# Patient Record
Sex: Female | Born: 1970
Health system: Southern US, Community
[De-identification: ages and names within clinical notes are randomized; demographics above are authoritative.]

## PROBLEM LIST (undated history)

## (undated) DIAGNOSIS — L0292 Furuncle, unspecified: Secondary | ICD-10-CM

## (undated) DIAGNOSIS — N946 Dysmenorrhea, unspecified: Secondary | ICD-10-CM

## (undated) DIAGNOSIS — E221 Hyperprolactinemia: Secondary | ICD-10-CM

## (undated) DIAGNOSIS — I10 Essential (primary) hypertension: Secondary | ICD-10-CM

## (undated) DIAGNOSIS — N938 Other specified abnormal uterine and vaginal bleeding: Secondary | ICD-10-CM

## (undated) DIAGNOSIS — E785 Hyperlipidemia, unspecified: Secondary | ICD-10-CM

## (undated) DIAGNOSIS — G43909 Migraine, unspecified, not intractable, without status migrainosus: Secondary | ICD-10-CM

## (undated) DIAGNOSIS — G4733 Obstructive sleep apnea (adult) (pediatric): Secondary | ICD-10-CM

## (undated) DIAGNOSIS — G43709 Chronic migraine without aura, not intractable, without status migrainosus: Secondary | ICD-10-CM

## (undated) DIAGNOSIS — D500 Iron deficiency anemia secondary to blood loss (chronic): Secondary | ICD-10-CM

## (undated) DIAGNOSIS — N83202 Unspecified ovarian cyst, left side: Principal | ICD-10-CM

## (undated) DIAGNOSIS — N83201 Unspecified ovarian cyst, right side: Secondary | ICD-10-CM

## (undated) DIAGNOSIS — E119 Type 2 diabetes mellitus without complications: Secondary | ICD-10-CM

## (undated) DIAGNOSIS — D259 Leiomyoma of uterus, unspecified: Secondary | ICD-10-CM

## (undated) DIAGNOSIS — R87619 Unspecified abnormal cytological findings in specimens from cervix uteri: Secondary | ICD-10-CM

## (undated) DIAGNOSIS — Z9989 Dependence on other enabling machines and devices: Secondary | ICD-10-CM

## (undated) DIAGNOSIS — R7309 Other abnormal glucose: Secondary | ICD-10-CM

## (undated) DIAGNOSIS — R9431 Abnormal electrocardiogram [ECG] [EKG]: Secondary | ICD-10-CM

## (undated) DIAGNOSIS — N920 Excessive and frequent menstruation with regular cycle: Secondary | ICD-10-CM

## (undated) DIAGNOSIS — G8929 Other chronic pain: Secondary | ICD-10-CM

## (undated) DIAGNOSIS — N809 Endometriosis, unspecified: Secondary | ICD-10-CM

## (undated) DIAGNOSIS — D219 Benign neoplasm of connective and other soft tissue, unspecified: Secondary | ICD-10-CM

## (undated) DIAGNOSIS — M545 Low back pain, unspecified: Secondary | ICD-10-CM

## (undated) DIAGNOSIS — G4730 Sleep apnea, unspecified: Secondary | ICD-10-CM

## (undated) HISTORY — DX: Abnormal electrocardiogram (ECG) (EKG): R94.31

## (undated) HISTORY — PX: ABDOMINAL HYSTERECTOMY: SHX81

## (undated) HISTORY — DX: Type 2 diabetes mellitus without complications: E11.9

## (undated) HISTORY — DX: Dependence on other enabling machines and devices: Z99.89

## (undated) HISTORY — DX: Sleep apnea, unspecified: G47.30

## (undated) HISTORY — DX: Migraine, unspecified, not intractable, without status migrainosus: G43.909

## (undated) HISTORY — DX: Benign neoplasm of connective and other soft tissue, unspecified: D21.9

## (undated) HISTORY — PX: EYE SURGERY: SHX253

## (undated) HISTORY — DX: Other specified abnormal uterine and vaginal bleeding: N93.8

## (undated) HISTORY — DX: Unspecified ovarian cyst, left side: N83.202

## (undated) HISTORY — DX: Essential (primary) hypertension: I10

## (undated) HISTORY — PX: REDUCTION MAMMAPLASTY: SUR839

## (undated) HISTORY — DX: Unspecified abnormal cytological findings in specimens from cervix uteri: R87.619

## (undated) HISTORY — DX: Obstructive sleep apnea (adult) (pediatric): G47.33

## (undated) HISTORY — DX: Endometriosis, unspecified: N80.9

## (undated) HISTORY — DX: Other abnormal glucose: R73.09

---

## 2002-12-31 HISTORY — PX: CHOLECYSTECTOMY: SHX55

## 2002-12-31 HISTORY — PX: LAPAROSCOPIC CHOLECYSTECTOMY: SUR755

## 2004-01-01 HISTORY — PX: REDUCTION MAMMAPLASTY: SUR839

## 2004-01-01 HISTORY — PX: BREAST REDUCTION SURGERY: SHX8

## 2013-12-31 HISTORY — PX: INCISION AND DRAINAGE: SHX5863

## 2014-12-31 DIAGNOSIS — Z8742 Personal history of other diseases of the female genital tract: Secondary | ICD-10-CM

## 2014-12-31 DIAGNOSIS — G4733 Obstructive sleep apnea (adult) (pediatric): Secondary | ICD-10-CM

## 2014-12-31 HISTORY — DX: Obstructive sleep apnea (adult) (pediatric): G47.33

## 2014-12-31 HISTORY — DX: Personal history of other diseases of the female genital tract: Z87.42

## 2015-06-08 ENCOUNTER — Encounter: Payer: Self-pay | Admitting: Family Medicine

## 2015-06-08 ENCOUNTER — Ambulatory Visit (INDEPENDENT_AMBULATORY_CARE_PROVIDER_SITE_OTHER): Payer: Self-pay | Admitting: Family Medicine

## 2015-06-08 VITALS — BP 112/86 | HR 77 | Temp 98.3°F | Ht 63.5 in | Wt 175.0 lb

## 2015-06-08 DIAGNOSIS — Z7689 Persons encountering health services in other specified circumstances: Secondary | ICD-10-CM

## 2015-06-08 DIAGNOSIS — E119 Type 2 diabetes mellitus without complications: Secondary | ICD-10-CM | POA: Insufficient documentation

## 2015-06-08 DIAGNOSIS — I10 Essential (primary) hypertension: Secondary | ICD-10-CM | POA: Diagnosis not present

## 2015-06-08 DIAGNOSIS — R202 Paresthesia of skin: Secondary | ICD-10-CM

## 2015-06-08 DIAGNOSIS — E669 Obesity, unspecified: Secondary | ICD-10-CM | POA: Insufficient documentation

## 2015-06-08 DIAGNOSIS — Z823 Family history of stroke: Secondary | ICD-10-CM

## 2015-06-08 DIAGNOSIS — F09 Unspecified mental disorder due to known physiological condition: Secondary | ICD-10-CM | POA: Diagnosis not present

## 2015-06-08 DIAGNOSIS — M5030 Other cervical disc degeneration, unspecified cervical region: Secondary | ICD-10-CM | POA: Diagnosis not present

## 2015-06-08 DIAGNOSIS — Z7189 Other specified counseling: Secondary | ICD-10-CM

## 2015-06-08 LAB — BASIC METABOLIC PANEL
BUN: 6 mg/dL (ref 6–23)
CALCIUM: 9 mg/dL (ref 8.4–10.5)
CHLORIDE: 105 meq/L (ref 96–112)
CO2: 25 meq/L (ref 19–32)
CREATININE: 0.6 mg/dL (ref 0.40–1.20)
GFR: 116.49 mL/min (ref >60.00)
Glucose, Bld: 87 mg/dL (ref 70–99)
Potassium: 3.2 mEq/L — ABNORMAL LOW (ref 3.5–5.1)
SODIUM: 137 meq/L (ref 135–145)

## 2015-06-08 MED ORDER — VALSARTAN 160 MG PO TABS: 160.0000 mg | ORAL_TABLET | Freq: Every day | ORAL | Status: DC

## 2015-06-08 NOTE — Progress Notes (Signed)
Pre visit review using our clinic review tool, if applicable. No additional management support is needed unless otherwise documented below in the visit note. 

## 2015-06-08 NOTE — Progress Notes (Signed)
HPI:  Melanie Little is here to establish care. Moved to [place] in January of 2016. Last PCP and physical: May of 2015 - normal pap but reports hx of remote abnormal pap and wants to get paps every year. Does not want to do pap today.  Has the following chronic problems that require follow up and concerns today:  HTN: -diagnosed with HTN about 6 years ago -meds: valsartan 163m daily -denies: CP, SOB, DOE, HA, swelling  Memory Issues: -started about 1 month ago -wrote her dates wrong a few times, sometimes forgets what she is going to do next, difficulty with remember to pay bills -never had any issues in the past -she has been under a lot of stress and has been anxious as husband recently had heart attack and a stroke -she thinks this is anxiety but she is worried about small stroke as her mother had many strokes and wants brain imaging -occ tingling in L hand and feet sometimes -denies: fevers, HA, vision changes, arm weakness,depression, known panic attacks, persistent paresthesias  Hx cervical DDD: -hx of radiculopathy L arm from this -reports had MRI in the past and was supposed to see NSU, but resolved after Pt -she has been pretty good since except for occ heaviness in L arm  ROS negative for unless reported above: fevers, unintentional weight loss, hearing or vision loss, chest pain, palpitations, struggling to breath, hemoptysis, melena, hematochezia, hematuria, falls, loc, si, thoughts of self harm  Past Medical History  Diagnosis Date  . Hypertension   . DUB (dysfunctional uterine bleeding)     around 2012, saw gyn at the time for eval    Past Surgical History  Procedure Laterality Date  . Cholecystectomy    . Breast reduction surgery    . Incision and drainage      Bilateral axillary region    Family History  Problem Relation Age of Onset  . CVA Mother     deceased, was 333at the age of her first stroke  . Diabetes Father   . Deep vein thrombosis  Father     History   Social History  . Marital Status: Married    Spouse Name: N/A  . Number of Children: N/A  . Years of Education: N/A   Social History Main Topics  . Smoking status: Never Smoker   . Smokeless tobacco: Not on file  . Alcohol Use: Not on file  . Drug Use: Not on file  . Sexual Activity: Not on file   Other Topics Concern  . 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  valsartan (DIOVAN) 160 MG tablet, Take 1 tablet (160 mg total) by mouth daily., Disp: 90 tablet, Rfl: 3  EXAM:  Filed Vitals:   06/08/15 1129  BP: 112/86  Pulse: 77  Temp: 98.3 F (36.8 C)    Body mass index is 30.51 kg/(m^2).  GENERAL: vitals reviewed and listed above, alert, oriented, appears well hydrated and in no acute distress  HEENT: atraumatic, conjunttiva clear, PERRLA, no obvious abnormalities on inspection of external nose and ears  NECK: no obvious masses on inspection, no bruit  LUNGS: clear to auscultation  bilaterally, no wheezes, rales or rhonchi, good air movement  CV: HRRR, no peripheral edema  MS: moves all extremities without noticeable abnormality  PSYCH: seems upset from the beginning of the visit - reports not given papers to complete until beginning of visit and seems upset about this, flat affect  NEURO: gait normal, mini cog normal, CN II-XII grossly intact, finger to nose normal, normal speech, normal thought processing, normal strength and sensation to light touch throughout bilat UE and LEs  ASSESSMENT AND PLAN:  Discussed the following assessment and plan:  Essential hypertension - Plan: Basic metabolic panel -continue current medication, lifestyle recs  Cognitive dysfunction - Plan: MR Brain W Wo  Contrast DDD (degenerative disc disease), cervical Paresthesia - Plan: MR Brain W Wo Contrast -benign exam, mild symptoms most likely related to anxiety and hx of DDD - not sig neurodeficits or findings on exam today -MRI after discussion risks/benefits for brain imaging -advised referral to PMR if recurrent radicular symptoms -advised CBT to assist in coping with recent stress -follow up immediately if any worsening or if persists  Family hx-stroke - Plan: MR Brain W Wo Contrast  Encounter to establish care -We reviewed the PMH, PSH, FH, SH, Meds and Allergies. -We provided refills for any medications we will prescribe as needed. -We addressed current concerns per orders and patient instructions. -We have asked for records for pertinent exams, studies, vaccines and notes from previous providers. -We have advised patient to follow up per instructions below.   -Patient advised to return or notify a doctor immediately if symptoms worsen or persist or new concerns arise.  Patient Instructions  BEFORE YOU LEAVE: -labs -schedule physical exam with pap smear in 3-4 month, come fasting   Consider seeing Dr. Glennon Hamilton to help with the stress  -We placed a referral for you as discussed for the MRI. It usually takes about 1-2 weeks to process and schedule this referral. If you have not heard from [place] regarding this appointment in 2 weeks please contact our office.  We recommend the following healthy lifestyle measures: - eat a healthy diet consisting of lots of vegetables, fruits, beans, nuts, seeds, healthy meats such as Buttery chicken and fish and whole grains.  - avoid starches, sweets, fried foods, fast food, processed foods, sodas, red meet and other fattening foods.  - get a least 150 minutes of aerobic exercise per week.         Colin Benton R.

## 2015-06-08 NOTE — Patient Instructions (Addendum)
BEFORE YOU LEAVE: -labs -schedule physical exam with pap smear in 3-4 month, come fasting   Consider seeing Dr. Glennon Hamilton to help with the stress  -We placed a referral for you as discussed for the MRI. It usually takes about 1-2 weeks to process and schedule this referral. If you have not heard from [place] regarding this appointment in 2 weeks please contact our office.  We recommend the following healthy lifestyle measures: - eat a healthy diet consisting of lots of vegetables, fruits, beans, nuts, seeds, healthy meats such as Helfman chicken and fish and whole grains.  - avoid starches, sweets, fried foods, fast food, processed foods, sodas, red meet and other fattening foods.  - get a least 150 minutes of aerobic exercise per week.

## 2015-06-10 ENCOUNTER — Other Ambulatory Visit: Payer: Self-pay | Admitting: *Deleted

## 2015-06-10 DIAGNOSIS — E876 Hypokalemia: Secondary | ICD-10-CM

## 2015-06-27 ENCOUNTER — Other Ambulatory Visit: Payer: 59

## 2015-07-08 ENCOUNTER — Other Ambulatory Visit: Payer: 59

## 2015-08-01 ENCOUNTER — Ambulatory Visit (INDEPENDENT_AMBULATORY_CARE_PROVIDER_SITE_OTHER): Payer: 59 | Admitting: Nurse Practitioner

## 2015-08-01 ENCOUNTER — Encounter: Payer: Self-pay | Admitting: Nurse Practitioner

## 2015-08-01 VITALS — BP 136/92 | HR 72 | Ht 63.5 in | Wt 181.0 lb

## 2015-08-01 DIAGNOSIS — Z113 Encounter for screening for infections with a predominantly sexual mode of transmission: Secondary | ICD-10-CM

## 2015-08-01 DIAGNOSIS — N920 Excessive and frequent menstruation with regular cycle: Secondary | ICD-10-CM | POA: Diagnosis not present

## 2015-08-01 DIAGNOSIS — Z0000 Encounter for general adult medical examination without abnormal findings: Secondary | ICD-10-CM | POA: Diagnosis not present

## 2015-08-01 DIAGNOSIS — N760 Acute vaginitis: Secondary | ICD-10-CM

## 2015-08-01 DIAGNOSIS — Z01419 Encounter for gynecological examination (general) (routine) without abnormal findings: Secondary | ICD-10-CM

## 2015-08-01 DIAGNOSIS — D259 Leiomyoma of uterus, unspecified: Secondary | ICD-10-CM

## 2015-08-01 LAB — POCT URINALYSIS DIPSTICK
Bilirubin, UA: NEGATIVE
GLUCOSE UA: NEGATIVE
Ketones, UA: NEGATIVE
Leukocytes, UA: NEGATIVE
Nitrite, UA: NEGATIVE
Protein, UA: NEGATIVE
RBC UA: NEGATIVE
Urobilinogen, UA: NEGATIVE
pH, UA: 5.5

## 2015-08-01 NOTE — Progress Notes (Signed)
Patient ID: Melanie Little, female   DOB: 05-07-1971, 44 y.o.   MRN: [id number] 45 y.o. [id number] Married  African American Fe here for annual exam. History of irregular menses for years with episodes of amenorrhea for 1-2 years.  She then went on Provera to start a cycle and took OCP for af few years.  Off OCP for 6 years now. Now menses is monthly lasting for 5 days.  Heavy for 3 days.  Super pad changing every 2-3 hours.  Has a lot of clots, some cramps.  She had last PUS in [place], [place]. And it was recommended then that she have surgery - she is unclear if hysterectomy or myomectomy.  She feels in general that menses is worse now than before.  Usually has migraines a few days before onset of cycle and last for 2 day - relief with Excedrin migraine.  Previous abnormal pap with colpo biopsy pap was normal and had repeat pap after that at a short interval.   Just married in January - together for 6 years.  Recent vaginal symptoms of itching and discomfort that seem to be related to vaginal gell for birth control.  She would like to discuss other options for birth control.  She is aware of risk with OCP with her history of HTN and Odem of CVA and DVT.  She would like the Mirena IUD.  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  Her husband has not found employment to this area yet as he is on medical disability followed a stroke and heart attack at age 70.  Patient's last menstrual period was 07/03/2015 (exact date).          Sexually active: Yes.    The current method of family planning is vaginal film.    Exercising: No.  The patient does not participate in regular exercise at present. Smoker:  no  Health Maintenance: Pap:  04/2014, normal,  History of colpo 2013, neg biopsy per patient MMG:  04/2014, normal - will schedule TDaP:  5 years ago Labs:  HB:  PCP  Urine:  Negative    reports that she has never smoked. She has never used smokeless tobacco. She reports that  she does not use illicit drugs.  Past Medical History  Diagnosis Date  . Hypertension   . DUB (dysfunctional uterine bleeding)     around 2012, saw gyn at the time for eval  . Fibroid   . Abnormal Pap smear of cervix about 2013    Colpo biopsy with normal result per pt.    Past Surgical History  Procedure Laterality Date  . Cholecystectomy  2004  . Breast reduction surgery  2005  . Incision and drainage Bilateral 2015    Bilateral axillary region ebaceous cyst    Current Outpatient Prescriptions  Medication Sig Dispense Refill  . valsartan (DIOVAN) 160 MG tablet Take 1 tablet (160 mg total) by mouth daily. 90 tablet 3   No current facility-administered medications for this visit.    Family History  Problem Relation Age of Onset  . CVA Mother 71    deceased, was 73 at the age of her first stroke  . Diabetes Father   . Deep vein thrombosis Father   . Lupus Sister     questionable diagnosis per patient    ROS:  Pertinent items are noted in HPI.  Otherwise, a comprehensive ROS was negative.  Exam:   BP 136/92 mmHg  Pulse 72  Ht 5' 3.5" (1.613 m)  Wt 181 lb (82.101 kg)  BMI 31.56 kg/m2  LMP 07/03/2015 (Exact Date) Height: 5' 3.5" (161.3 cm) Ht Readings from Last 3 Encounters:  08/01/15 5' 3.5" (1.613 m)  06/08/15 5' 3.5" (1.613 m)    General appearance: alert, cooperative and appears stated age Head: Normocephalic, without obvious abnormality, atraumatic Neck: no adenopathy, supple, symmetrical, trachea midline and thyroid normal to inspection and palpation Lungs: clear to auscultation bilaterally Breasts: normal appearance, no masses or tenderness, surgical reduction scars with large keloid scars at the axilla line. Heart: regular rate and rhythm Abdomen: soft, non-tender; no masses,  no organomegaly Extremities: extremities normal, atraumatic, no cyanosis or edema Skin: Skin color, texture, turgor normal. No rashes or lesions Lymph nodes: Cervical,  supraclavicular, and axillary nodes normal. No abnormal inguinal nodes palpated Neurologic: Grossly normal   Pelvic: External genitalia:  no lesions              Urethra:  normal appearing urethra with no masses, tenderness or lesions              Bartholin's and Skene's: normal                 Vagina: normal appearing vagina with normal color and discharge, no lesions              Cervix: anteverted              Pap taken: Yes.   Bimanual Exam:  Uterus:  enlarged, 14-16  weeks size              Adnexa: positive for: enlargement and and decrease cervical motion due to enlargement               Rectovaginal: Confirms               Anus:  normal sphincter tone, no lesions  Chaperone present:  yes  A:  Well Woman with normal exam  History of uterine fibroid (s) see report below  Menorrhagia  Vaginitis from birth control - vaginal gel  R/O STD's  History of  Migraine, HTN, BMI 31.56  History of abnormal pap with colpo biopsy 2013  S/P breast reduction 2015    P:   Reviewed health and wellness pertinent to exam  Pap smear as above  Mammogram is due and will schedule  Will follow up on labs, STD's, Affirm, pap  Will get ROI for pap, PUS done in [place]  Discussed options for birth control - cannot take OCP with risk factors.  Dicussed IUD but unable to tell if this is a good options given the enlargement of the uterus.  For now she will continue with vaginal spermicide.  Counseled on breast self exam, mammography screening, family planning choices, adequate intake of calcium and vitamin D, diet and exercise return annually or prn  An After Visit Summary was printed and given to the patient.  PUS at [facility]:  05/13/13- PUS with conclusion of 9.4 X 6.8 X 6.4 cm left adnexal mass involving the left ovary that could be hemorrhagic cyst, endometrioma, or possible dermoid.  CT scan:  05/21/13: large cystic mass left adnexa 7.5 cm with septations.  Smaller cystic lesion on the right  adnexa measuring 3.3 cm with multinodular uterus.

## 2015-08-01 NOTE — Patient Instructions (Addendum)

## 2015-08-02 LAB — WET PREP BY MOLECULAR PROBE
CANDIDA SPECIES: NEGATIVE
GARDNERELLA VAGINALIS: NEGATIVE
Trichomonas vaginosis: NEGATIVE

## 2015-08-02 LAB — STD PANEL
HIV 1&2 Ab, 4th Generation: NONREACTIVE
Hepatitis B Surface Ag: NEGATIVE

## 2015-08-03 LAB — IPS N GONORRHOEA AND CHLAMYDIA BY PCR

## 2015-08-04 ENCOUNTER — Telehealth: Payer: Self-pay | Admitting: Emergency Medicine

## 2015-08-04 DIAGNOSIS — D259 Leiomyoma of uterus, unspecified: Secondary | ICD-10-CM

## 2015-08-04 DIAGNOSIS — N921 Excessive and frequent menstruation with irregular cycle: Secondary | ICD-10-CM

## 2015-08-04 LAB — IPS PAP TEST WITH HPV

## 2015-08-04 NOTE — Telephone Encounter (Signed)
Spoke with patient to verify and review benefits for shgm. Attempted appointment on Tuesday 08/09/15, however patient stated she is due any day to start her cycle. Patient asks if this will effect the test. Spoke with nurse Olivia Mackie and she stated based on the patients birth control method and other factors patient is advised to call on first day of cycle to schedule within 10 days. Patient understands. Patient asked about recent lab results. Transferred to Olivia Mackie to review results. Pt understands [facility] plan going forward and will call with next cycle. Patient understands benefit.

## 2015-08-04 NOTE — Telephone Encounter (Signed)
Patient was on the phone with insurance and billing representative and requested her lab results. Results given to patient and questions answered. She will call with the first day of her cycle to schedule Sonohysterogram.  Routing to provider for final review. Patient agreeable to disposition. Will close encounter.    Marland Kitchen

## 2015-08-04 NOTE — Telephone Encounter (Signed)
-----   Message from Kem Boroughs, Luis Llorens Torres sent at 08/03/2015  4:55 PM EDT ----- Please let pt. know that STD panel is negative for HIV, Hep B, STS, GC & chlamydia.  The Affirm test also is negative.

## 2015-08-06 NOTE — Progress Notes (Signed)
Encounter reviewed by Dr. Aundria Rud. Will start with pelvic ultrasound for evaluation.

## 2015-08-08 NOTE — Telephone Encounter (Signed)
Left message to call [place] at [phone].  Spoke with Milford Cage, FNP who states patient will need to be seen for PUS next Thursday 08/18/2015 with Dr.Silva.

## 2015-08-08 NOTE — Telephone Encounter (Signed)
Spoke with patient. Advised okay to proceed with PUS per Milford Cage, FNP after speaking with Dr.Silva. Appointment scheduled for 08/18/2015 at 11:30am with 12 pm consult with Dr.Silva. Patient is agreeable to date and time.  Cc: Dr.Silva  Routing to provider for final review. Patient agreeable to disposition. Will close encounter.   Patient aware provider will review message and nurse will return call if any additional advice or change of disposition.

## 2015-08-09 ENCOUNTER — Telehealth: Payer: Self-pay | Admitting: Emergency Medicine

## 2015-08-09 DIAGNOSIS — IMO0002 Reserved for concepts with insufficient information to code with codable children: Secondary | ICD-10-CM

## 2015-08-09 NOTE — Telephone Encounter (Signed)
-----   Message from Kem Boroughs, [place] sent at 08/05/2015  8:00 AM EDT ----- Please let pt. Know that she will need a repeat colpo biopsy - last one was done in [place].

## 2015-08-09 NOTE — Telephone Encounter (Deleted)
-----   Message from Kem Boroughs, [place] sent at 08/05/2015  8:00 AM EDT ----- Please let pt. Know that she will need a repeat colpo biopsy - last one was done in [place].

## 2015-08-09 NOTE — Telephone Encounter (Signed)
I would like for the patient to keep her ultrasound appointment with me for 08/18/15 and get this done first.  Please precert the colposcopy and schedule the patient.  She has a lot of gynecologic issues that I would like to move forward with in her care.

## 2015-08-09 NOTE — Telephone Encounter (Signed)
Sending this message again.  I am not sure it went through.

## 2015-08-09 NOTE — Telephone Encounter (Addendum)
Spoke with patient. Advised of abnormal cells on pap smear. Needs repeat colposcopy per Milford Cage, FNP.  Patient sexually active, uses contraceptive film for birth control. Started her cycle today and typically lasts for 4-5 days. Patient is currently scheduled for 08/18/15 for Pelvic ultrasound  With Dr. Quincy Simmonds for evaluation of enlarged uterus with history of uterine fibroids. Advised patient may need to reschedule Pelvic ultrasound for after colposcopy and complete colposcopy first as will need to be scheduled for procedure within 12 days of start of cycle.  Advised will need to confirm with Dr. Quincy Simmonds.  Patient agreeable. Advised I will contact her with response from Dr. Quincy Simmonds and return call to schedule colposcopy. Patient agreeable.  Recall for one month entered for colposcopy per Lamont Snowball, RN.  Routing to  [employer] for colposcopy pre-certification.  Dr. Quincy Simmonds, can you review and advise?

## 2015-08-11 NOTE — Telephone Encounter (Signed)
Message left to return call to Melanie Little at [phone].    

## 2015-08-11 NOTE — Telephone Encounter (Signed)
Returning a call to [employer]

## 2015-08-11 NOTE — Telephone Encounter (Signed)
Call to patient.  She is agreeable to message from Dr. Quincy Simmonds.  She is scheduled for Pelvic ultrasound with Dr. Quincy Simmonds on 08/18/15.  She is scheduled for colposcopy on 08/19/15 at 1500 with Dr. Quincy Simmonds. LMP 08/09/15.  She is given pre-procedure instructions for colposcopy and verbalized understanding.  Advised will be contacted with insurance benefits information. cc Kerry Hough.   Routing to provider for final review. Patient agreeable to disposition. Will close encounter.

## 2015-08-16 ENCOUNTER — Ambulatory Visit: Payer: Self-pay | Admitting: Family Medicine

## 2015-08-18 ENCOUNTER — Ambulatory Visit (INDEPENDENT_AMBULATORY_CARE_PROVIDER_SITE_OTHER): Payer: 59 | Admitting: Obstetrics and Gynecology

## 2015-08-18 ENCOUNTER — Ambulatory Visit (INDEPENDENT_AMBULATORY_CARE_PROVIDER_SITE_OTHER): Payer: 59

## 2015-08-18 ENCOUNTER — Encounter: Payer: Self-pay | Admitting: Obstetrics and Gynecology

## 2015-08-18 VITALS — BP 130/82 | HR 76 | Ht 63.5 in | Wt 181.0 lb

## 2015-08-18 DIAGNOSIS — N921 Excessive and frequent menstruation with irregular cycle: Secondary | ICD-10-CM | POA: Diagnosis not present

## 2015-08-18 DIAGNOSIS — N83202 Unspecified ovarian cyst, left side: Secondary | ICD-10-CM

## 2015-08-18 DIAGNOSIS — N8320 Unspecified ovarian cysts: Secondary | ICD-10-CM | POA: Diagnosis not present

## 2015-08-18 DIAGNOSIS — D259 Leiomyoma of uterus, unspecified: Secondary | ICD-10-CM

## 2015-08-18 DIAGNOSIS — N920 Excessive and frequent menstruation with regular cycle: Secondary | ICD-10-CM | POA: Diagnosis not present

## 2015-08-18 NOTE — Progress Notes (Signed)
Subjective  44 y.o. [id number] African American female here for pelvic ultrasound for  Follow up of pelvic mass and uterine fibroids noted on ultrasound and CT scan at outside institutions from 2014.  Pelvic ultrasound - 05/13/13 - [facility] in [place], [place]: Uterus with 3 fibroids - largest 1.5 cm.  EMS 7 mm.  Left ovarian mass 6.8 x 6.4 x 9.4 cm with low level echoes - hemorrhagic versus endometrioma versus dermoid cyst. Right ovary 3.5 x 2.2 x 3.1 cm not seen separately from the uterus.   CT scan - 05/21/13 - [facility]: Uterus with multi-lobulated masses consistent with fibroids.  Left adnexa with 7.5 cm cystic mass with septation and mildly thickened wall. Right adnexa with 3.3 cm right cyst. Cysts suggestive of hemorrhagic cysts.  History is as follows:   History of skipped menses for 1-2 years at at time in the past.  Has taken Provera and OCPs. Now getting cycles regularly.   Developed menstrual cramps in the last year.  Also having heavier cycles and blood clotting.  Menses heavy second or third day.   Wearing overnight pads during the day.  Pad change every 1.5 hours for at least 2 of 4 days. No intermensttrual bleeding.  Can spot with intercourse.  Using contraceptive film for pregnancy prevention. Feels like she had a reaction a get to this with increased discharge.  Menstrual headaches prior to menses. Has photophobia.  No aura.  Has some heaviness of her left arm during her menses.   Objective  Pelvic ultrasound images and report reviewed with patient.  Uterus - 8 fibroids ranging from 0.94 - 1.37 cm EMS - 9.65 mm.  No masses. Ovaries - right ovary - follicles.  Left ovary 62 x 68 mm thick walled cyst with echogenic fluid with hemorrhagic cyst versus other etiology.  No abnormal blood flow. Free fluid - no        Pap  08/01/15 - ASCUS and positive HR HPV.  GC/CT 08/01/15 - both negative.   Assessment  Menorrhagia.  Post  coital bleeding.  Fibroids.  Complex left adnexal mass.  No change in size since 2014 assuming this is the same that was seen in 2014.  I suspect this may be a cystadenoma.  Doubt malignancy.  Abnormal pap. Menstrual headaches.  HTN.  FH of DVT.  Plan  Comprehensive discussion of fibroids - etiology, natural history, benign nature, symptoms, and treatment options - estrogen free contraception, uterine artery embolization, myomectomy, hysterectomy.  Discussion of ovarian cysts.  Will plan for a minimum of repeat ultrasound in 6 weeks.  I discussed potential removal of the ovary if persistent.  Will return for colposcopy and EMB.  Procedures explained.  ____25__ minutes face to face time of which over 50% was spent in counseling.   After visit summary to patient.

## 2015-08-19 ENCOUNTER — Encounter: Payer: Self-pay | Admitting: Obstetrics and Gynecology

## 2015-08-19 ENCOUNTER — Ambulatory Visit (INDEPENDENT_AMBULATORY_CARE_PROVIDER_SITE_OTHER): Payer: 59 | Admitting: Obstetrics and Gynecology

## 2015-08-19 DIAGNOSIS — R896 Abnormal cytological findings in specimens from other organs, systems and tissues: Secondary | ICD-10-CM | POA: Diagnosis not present

## 2015-08-19 DIAGNOSIS — IMO0002 Reserved for concepts with insufficient information to code with codable children: Secondary | ICD-10-CM

## 2015-08-19 NOTE — Progress Notes (Signed)
Subjective:     Patient ID: Melanie Little, female   DOB: [birth year], 44 y.o.   MRN: [id number]  HPI  Patient here today for colposcopy.  Pap smear on 08-01-15 was Ascus:Pos HR HPV.  Patient has a history of abnormal pap smear with negative colposcopy in 2013--no treatment to cervix.  Patient also being evaluated for menorrhagia, fibroids, and complex left adnexal mass that is stable for the last 2 years.   Needs EMB, and this needs precert.  Review of Systems  LMP:  08-12-15 Contraception: Vaginal film     Objective:   Physical Exam  Genitourinary:    Colposcopy Consent for procedure.  Speculum placed in vagina.  3% acetic acid used.  Colposcopy satisfactory.  Thin rim of acetowhite at 6:00 and 12:00.  Friability of the endocervix with contact with Q-tips. Specimen 1 - ECC to pathology.  Specimen 2- biopsy of exocervix at 6:00 to pathology.  Specimen 3 - biopsy of exocervix at 12:00 to pathology. Monsel's placed. Minimal EBL.  No complications.     Assessment:     ASCUS. Positive HR HPV.  Friable cervix.  Menorrhagia. Fibroids.  Complex left adnexal mass.     Plan:     Follow up biopsies. Instructions and precautions given.  Will still need to return for EMB which needs precert from insurance.  Final plan to follow.   After visit summary to patient.

## 2015-08-19 NOTE — Patient Instructions (Signed)
Colposcopy, Care After Refer to this sheet in the next few weeks. These instructions provide you with information on caring for yourself after your procedure. Your health care provider may also give you more specific instructions. Your treatment has been planned according to current medical practices, but problems sometimes occur. Call your health care provider if you have any problems or questions after your procedure. WHAT TO EXPECT AFTER THE PROCEDURE  After your procedure, it is typical to have the following:  Cramping. This often goes away in a few minutes.  Soreness. This may last for 2 days.  Lightheadedness. Lie down for a few minutes if this occurs. You may also have some bleeding or dark discharge for a few days. You may need to wear a sanitary pad during this time. HOME CARE INSTRUCTIONS  Avoid sex, douching, and using tampons for 3 days or as directed by your health care provider.  Only take over-the-counter or prescription medicines as directed by your health care provider. Do not take aspirin because it can cause bleeding.  Continue to take birth control pills if you are on them.  Not all test results are available during your visit. If your test results are not back during the visit, make an appointment with your health care provider to find out the results. Do not assume everything is normal if you have not heard from your health care provider or the medical facility. It is important for you to follow up on all of your test results.  Follow your health care provider's advice regarding activity, follow-up visits, and follow-up Pap tests. SEEK MEDICAL CARE IF:  You develop a rash.  You have problems with your medicine. SEEK IMMEDIATE MEDICAL CARE IF:  You are bleeding heavily or are passing blood clots.  You have a fever.  You have abnormal vaginal discharge.  You are having cramps that do not go away after taking your pain medicine.  You feel lightheaded, dizzy, or  faint.  You have stomach pain. Document Released: 10/07/2013 Document Reviewed: 10/07/2013 [facility] Patient Information 2015 [place], [place]. This information is not intended to replace advice given to you by your health care provider. Make sure you discuss any questions you have with your health care provider.

## 2015-08-23 LAB — IPS OTHER TISSUE BIOPSY

## 2015-08-25 ENCOUNTER — Telehealth: Payer: Self-pay | Admitting: Obstetrics and Gynecology

## 2015-08-25 NOTE — Telephone Encounter (Signed)
Spoke with patient,reviewed benefits for procedure and patient is agreeable. Routing to triage to schedule appointment.

## 2015-08-25 NOTE — Telephone Encounter (Signed)
Call to patient to review benefits for procedure. Left voicemail for patient to return my call

## 2015-08-26 NOTE — Telephone Encounter (Signed)
Dr. Quincy Simmonds, can you review and advise on when best time to schedule? Need to schedule Endometrial biopsy for patient.  Sexually active and using contraceptive film.

## 2015-08-28 NOTE — Telephone Encounter (Signed)
Please schedule at time when there is not risk of pregnancy, i.e. Just after menses complete and before ovulation time.  I hope this helps.

## 2015-08-29 NOTE — Telephone Encounter (Signed)
Message left to return call to [place] at [phone].   Need to schedule Endometrial biopsy.  Patient aware of benefits for biopsy. LMP 08/12/15 per last ultrasound note.    Patient also needs to have an appointment scheduled for a repeat ultrasound for first week in October - has a complex left ovarian cyst.

## 2015-08-30 NOTE — Telephone Encounter (Signed)
Spoke with patient. Advised of need to schedule Endometrial biopsy. Patient aware of need and discussed reasons for biopsy, evaluate for any presence of abnormal cells or precancerous changes of the uterine lining, infection, polyps or fibroids. Patient agreeable. She is advised to call with first day of cycle so that Endometrial biopsy can be scheduled within first 12 days of cycle.  Patient agreeable. She also has questions regarding colposcopy and pap results, discussed HPV and results and patient verbalized understanding. She will call back with first day of cycle to schedule.

## 2015-08-31 NOTE — Telephone Encounter (Signed)
Please also schedule the pelvic ultrasound for 6 weeks from the last ultrasound visit.  This is very important as the patient has a very large ovarian cyst that needs to be followed.  Thank you.

## 2015-09-12 ENCOUNTER — Telehealth: Payer: Self-pay | Admitting: Nurse Practitioner

## 2015-09-12 NOTE — Telephone Encounter (Signed)
Patient was told to call on the first day of her cycle to schedule further testing. Last seen 08/19/15.

## 2015-09-12 NOTE — Telephone Encounter (Signed)
Message left to return call to [place] at [phone].   Orders in placed for patient for Endometrial biopsy.  Patient to have prior to cycle day 12.  Also needs Pelvic ultrasound scheduled, but for 6 weeks after initial ultrasound on 08/18/15 approximately 09/22/15 for follow up ovarian cyst.

## 2015-09-14 NOTE — Telephone Encounter (Signed)
Message left to return call to [place] at [phone].  Patient can speak with [facility] as well, needs to schedule Endometrial biopsy, cycle dependent.

## 2015-09-15 NOTE — Telephone Encounter (Signed)
Called patient.  Cycle started 09/11/15.   She is advised needs to schedule Endometrial biopsy prior to day 12. Patient requests afternoon only appointment. Scheduled for 09/19/15 at 1500.  Patient declines to schedule follow up ultrasound at this time. She needs afternoon appointment. Advised can discuss with provider at procedure visit for Endometrial biopsy. Will make plan for follow up Pelvic ultrasound. Patient declines to take 09/22/15 for Endometrial biopsy and Pelvic ultrasound because she cannot come in the morning.   Pre-procedure instructions given. Motrin 800 mg PO one hour before appointment with food and fluids.   Call with any concerns prior to appointment.  Routing to provider for final review. Patient agreeable to disposition. Will close encounter.

## 2015-09-16 ENCOUNTER — Telehealth: Payer: Self-pay | Admitting: Obstetrics and Gynecology

## 2015-09-16 NOTE — Telephone Encounter (Signed)
Phone call returned to patient to review the endometrial biopsy procedure and indications.  Patient now understands that this is for evaluation of endometrial pathology and not cervical pathology. She will proceed with the procedure.  I recommended Motrin 800 mg one hour prior to procedure and eat food before her appointment.   I also reviewed the need for a pelvic ultrasound to recheck her ovarian mass at the beginning of October.

## 2015-09-16 NOTE — Telephone Encounter (Signed)
Patient called requesting to cancel her endometrial biopsy with Dr Quincy Simmonds scheduled for Monday 09/19/15. Patient had multiple clinical concerns regarding procedure/treatment plan. Suggested patient speak with provider prior to making cancellation. Patient agreeable. Spoke with Dr Quincy Simmonds, she would like to call patient to review information.

## 2015-09-19 ENCOUNTER — Encounter: Payer: Self-pay | Admitting: Family Medicine

## 2015-09-19 ENCOUNTER — Ambulatory Visit (INDEPENDENT_AMBULATORY_CARE_PROVIDER_SITE_OTHER): Payer: 59 | Admitting: Obstetrics and Gynecology

## 2015-09-19 ENCOUNTER — Encounter: Payer: Self-pay | Admitting: Obstetrics and Gynecology

## 2015-09-19 VITALS — BP 130/96 | HR 70 | Ht 63.5 in | Wt 184.4 lb

## 2015-09-19 DIAGNOSIS — N8320 Unspecified ovarian cysts: Secondary | ICD-10-CM

## 2015-09-19 DIAGNOSIS — D259 Leiomyoma of uterus, unspecified: Secondary | ICD-10-CM

## 2015-09-19 DIAGNOSIS — N920 Excessive and frequent menstruation with regular cycle: Secondary | ICD-10-CM | POA: Diagnosis not present

## 2015-09-19 DIAGNOSIS — N83202 Unspecified ovarian cyst, left side: Secondary | ICD-10-CM

## 2015-09-19 NOTE — Progress Notes (Signed)
Patient ID: Melanie Little, female   DOB: 1971/12/16, 44 y.o.   MRN: [id number] GYNECOLOGY  VISIT   HPI: 44 y.o.   Married  [place] American  female   [phone] with Patient's last menstrual period was 09/11/2015 (exact date).   here for Endometrial biopsy due to menorrhagia. Has known fibroids and large left ovarian cyst.  Thinking about uterine artery embolization.   Pants not fitting as well.  Feels full early.   GYNECOLOGIC HISTORY: Patient's last menstrual period was 09/11/2015 (exact date). Contraception:Vaginal film Menopausal hormone therapy: n/a Last mammogram: 04/5014 [place] per [place] Last pap smear: 08-01-15 Ascus:Pos HR HPV;colposcopy revealed ECC-benign, Exocervical bx--HPV effect but no dysplasia.        OB History    Gravida Para Term Preterm AB TAB SAB Ectopic Multiple Living   4 1 1  0 3 3 0 0 0 1         Patient Active Problem List   Diagnosis Date Noted  . Essential hypertension 06/08/2015    Past Medical History  Diagnosis Date  . Hypertension   . DUB (dysfunctional uterine bleeding)     around 2012, saw gyn at the time for eval  . Fibroid   . Abnormal Pap smear of cervix about 2013    Colpo biopsy with normal result per pt.    Past Surgical History  Procedure Laterality Date  . Cholecystectomy  2004  . Breast reduction surgery  2005  . Incision and drainage Bilateral 2015    Bilateral axillary region ebaceous cyst    Current Outpatient Prescriptions  Medication Sig Dispense Refill  . valsartan (DIOVAN) 160 MG tablet Take 1 tablet (160 mg total) by mouth daily. 90 tablet 3   No current facility-administered medications for this visit.     ALLERGIES: Review of patient's allergies indicates no known allergies.  Family History  Problem Relation Age of Onset  . CVA Mother 74    deceased, was 82 at the age of her first stroke  . Diabetes Father   . Deep vein thrombosis Father   . Lupus Sister     questionable diagnosis per patient     Social History   Social History  . Marital Status: Married    Spouse Name: N/A  . Number of Children: N/A  . Years of Education: N/A   Occupational History  . Not on file.   Social History Main Topics  . Smoking status: Never Smoker   . Smokeless tobacco: Never Used  . Alcohol Use: Not on file  . Drug Use: No  . Sexual Activity: Yes    Birth Control/ Protection: Other-see comments     Comment: vaginal film, spermicide gel   Other Topics Concern  . 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  PHYSICAL EXAMINATION:    BP 130/96 mmHg  Pulse 70  Ht 5' 3.5" (1.613 m)  Wt 184 lb 6.4 oz (83.643 kg)  BMI 32.15 kg/m2  LMP 09/11/2015 (Exact Date)    General appearance: alert, cooperative and appears stated age    Pelvic: External genitalia:  no lesions  Urethra:  normal appearing urethra with no masses, tenderness or lesions              Bartholins and Skenes: normal                 Vagina: normal appearing vagina with normal color and discharge, no lesions              Cervix: no lesions              Bimanual Exam:  Uterus:  enlarged,  12 weeks size              Adnexa: no mass, fullness, tenderness of right.  Left adnexal fullness.   Procedure - endometrial biopsy Consent performed. Speculum place in vagina.  Sterile prep of cervix with Hibiclens. Tenaculum to anterior cervical lip. Paracervical block with 10 cc 1% lidocaine - yes.  Lot [id number], exp 01/01/16. Pipelle placed to       8   cm without difficulty twice. Tissue obtained and sent to pathology. Speculum removed.  No complications. Minimal EBL.  Chaperone was present for exam.  ASSESSMENT  Menorrhagia.  Fibroid uterus.  Abnormal pap.   ASCUS and positive HR HPV.  Colpo HPV changes but no dysplasia. Left adnexal mass.   PLAN  Instructions and precautions regarding EMB. Will call with results of EMB. Pelvic ultrasound in about 2 weeks.  Will schedule. Discussed uterine artery embolization with patient.   Discussed removal of left tube and ovary and hysterectomy also as a way to surgically treat the pelvic mass, if persistent, and the uterine fibroids and bleeding.  An After Visit Summary was printed and given to the patient.  ___25___ minutes face to face time of which over 50% was spent in counseling.

## 2015-09-19 NOTE — Patient Instructions (Addendum)
Uterine Artery Embolization for Fibroids Uterine artery embolization is a nonsurgical treatment to shrink fibroids. A thin plastic tube (catheter) is used to inject material that blocks off the blood supply to the fibroid, which causes the fibroid to shrink. LET [facility] CARE PROVIDER KNOW ABOUT:  Any allergies you have.  All medicines you are taking, including vitamins, herbs, eye drops, creams, and over-the-counter medicines.  Previous problems you or members of your family have had with the use of anesthetics.  Any blood disorders you have.  Previous surgeries you have had.  Medical conditions you have. RISKS AND COMPLICATIONS  Injury to the uterus from decreased blood supply  Infection.  Blood infection (septicemia).  Lack of menstrual periods (amenorrhea).  Death of tissue cells (necrosis) around your bladder or vulva.  Development of a hole between organs or from an organ to the surface of your skin (fistula).  Blood clot in the legs (deep vein thrombosis) or lung (pulmonary embolus). BEFORE THE PROCEDURE  Ask your health care provider about changing or stopping your regular medicines.   Do not take aspirin or blood thinners (anticoagulants) for 1 week before the surgery or as directed by your health care provider.  Do not eat or drink anything for 8 hours before the surgery or as directed by your health care provider.   Empty your bladder before the procedure begins. PROCEDURE   An IV tube will be placed into one of your veins. This will be used to give you a sedative and pain medication (conscious sedation).  You will be given a medicine that numbs the area (local anesthetic).  A small cut will be made in your groin. A catheter is then inserted into the main artery of your leg.  The catheter will be guided through the artery to your uterus. A series of images will be taken while dye is injected through the catheter in your groin. X-rays are taken at the  same time. This is done to provide a road map of the blood supply to your uterus and fibroids.  Tiny plastic spheres, about the size of sand grains, will be injected through the catheter. Metal coils may be used to help block the artery. The particles will lodge in tiny branches of the uterine artery that supplies blood to the fibroids.  The procedure is repeated on the artery that supplies the other side of the uterus.  The catheter is then removed and pressure is held to stop any bleeding. No stitches are needed.  A dressing is then placed over the cut (incision). AFTER THE PROCEDURE  You will be taken to a recovery area where your progress will be monitored until you are awake, stable, and taking fluids well. If there are no other problems, you will then be moved to a regular hospital room.  You will be observed overnight in the hospital.  You will have cramping that should be controlled with pain medication. Document Released: 03/04/2006 Document Revised: 10/07/2013 Document Reviewed: 07/02/2013 [facility] Patient Information 2015 [place], [place]. This information is not intended to replace advice given to you by your health care provider. Make sure you discuss any questions you have with your health care provider.  Endometrial Biopsy, Care After Refer to this sheet in the next few weeks. These instructions provide you with information on caring for yourself after your procedure. Your health care provider may also give you more specific instructions. Your treatment has been planned according to current medical practices, but problems sometimes occur.  Call your health care provider if you have any problems or questions after your procedure. WHAT TO EXPECT AFTER THE PROCEDURE After your procedure, it is typical to have the following:  You may have mild cramping and a small amount of vaginal bleeding for a few days after the procedure. This is normal. HOME CARE INSTRUCTIONS  Only take  over-the-counter or prescription medicine as directed by your health care provider.  Do not douche, use tampons, or have sexual intercourse until your health care provider approves.  Follow your health care provider's instructions regarding any activity restrictions, such as strenuous exercise or heavy lifting. SEEK MEDICAL CARE IF:  You have heavy bleeding or bleeding longer than 2 days after the procedure.  You have bad smelling drainage from your vagina.  You have a fever and chills.  Youhave severe lower stomach (abdominal) pain. SEEK IMMEDIATE MEDICAL CARE IF:  You have severe cramps in your stomach or back.  You pass large blood clots.  Your bleeding increases.  You become weak or lightheaded, or you pass out. Document Released: 10/07/2013 Document Reviewed: 10/07/2013 [facility] Patient Information 2015 [place], [place]. This information is not intended to replace advice given to you by your health care provider. Make sure you discuss any questions you have with your health care provider.

## 2015-09-20 ENCOUNTER — Other Ambulatory Visit: Payer: Self-pay

## 2015-09-20 DIAGNOSIS — Z1231 Encounter for screening mammogram for malignant neoplasm of breast: Secondary | ICD-10-CM

## 2015-09-21 LAB — IPS OTHER TISSUE BIOPSY

## 2015-09-26 ENCOUNTER — Telehealth: Payer: Self-pay | Admitting: Emergency Medicine

## 2015-09-26 NOTE — Telephone Encounter (Signed)
Message left to return call to [place] at [phone].    

## 2015-09-26 NOTE — Telephone Encounter (Signed)
Patient returned call and she is given message from Dr. Quincy Simmonds.  Verbalized understanding of results.  Scheduled for Pelvic ultrasound for 10/13/15 at 1130 with consult at 1200. Patient agreeable and aware of cancellation policy.  cc Kerry Hough for insurance pre-certification and patient contact. .  Routing to provider for final review. Patient agreeable to disposition. Will close encounter.

## 2015-09-26 NOTE — Telephone Encounter (Signed)
-----   Message from Nunzio Cobbs, MD sent at 09/21/2015  8:56 PM EDT ----- Please inform patient of endometrial biopsy results showing proliferative endometrium and no signs of hyperplasia or malignancy.  Please schedule her pelvic ultrasound for October 6 or 13 with me.  Cc- Marisa Sprinkles

## 2015-09-27 ENCOUNTER — Encounter: Payer: Self-pay | Admitting: *Deleted

## 2015-09-27 ENCOUNTER — Ambulatory Visit (INDEPENDENT_AMBULATORY_CARE_PROVIDER_SITE_OTHER): Payer: 59 | Admitting: Family Medicine

## 2015-09-27 ENCOUNTER — Encounter: Payer: Self-pay | Admitting: Family Medicine

## 2015-09-27 VITALS — BP 130/82 | HR 85 | Temp 98.2°F | Ht 63.5 in | Wt 185.9 lb

## 2015-09-27 DIAGNOSIS — I10 Essential (primary) hypertension: Secondary | ICD-10-CM | POA: Diagnosis not present

## 2015-09-27 DIAGNOSIS — L732 Hidradenitis suppurativa: Secondary | ICD-10-CM

## 2015-09-27 DIAGNOSIS — M5030 Other cervical disc degeneration, unspecified cervical region: Secondary | ICD-10-CM | POA: Diagnosis not present

## 2015-09-27 DIAGNOSIS — N920 Excessive and frequent menstruation with regular cycle: Secondary | ICD-10-CM

## 2015-09-27 DIAGNOSIS — E669 Obesity, unspecified: Secondary | ICD-10-CM

## 2015-09-27 DIAGNOSIS — Z0000 Encounter for general adult medical examination without abnormal findings: Secondary | ICD-10-CM

## 2015-09-27 DIAGNOSIS — G4733 Obstructive sleep apnea (adult) (pediatric): Secondary | ICD-10-CM

## 2015-09-27 LAB — LIPID PANEL
CHOLESTEROL: 162 mg/dL (ref 0–200)
HDL: 57.8 mg/dL (ref >39.00)
LDL Cholesterol: 77 mg/dL (ref 0–99)
NonHDL: 104.53
Total CHOL/HDL Ratio: 3
Triglycerides: 140 mg/dL (ref 0.0–149.0)
VLDL: 28 mg/dL (ref 0.0–40.0)

## 2015-09-27 LAB — BASIC METABOLIC PANEL
BUN: 8 mg/dL (ref 6–23)
CALCIUM: 9 mg/dL (ref 8.4–10.5)
CHLORIDE: 102 meq/L (ref 96–112)
CO2: 32 meq/L (ref 19–32)
CREATININE: 0.61 mg/dL (ref 0.40–1.20)
GFR: 114.13 mL/min (ref >60.00)
GLUCOSE: 98 mg/dL (ref 70–99)
Potassium: 3.2 mEq/L — ABNORMAL LOW (ref 3.5–5.1)
SODIUM: 140 meq/L (ref 135–145)

## 2015-09-27 LAB — HEMOGLOBIN A1C: Hgb A1c MFr Bld: 6.1 % (ref 4.6–6.5)

## 2015-09-27 MED ORDER — CLINDAMYCIN PHOSPHATE 1 % EX GEL: Freq: Two times a day (BID) | CUTANEOUS | Status: DC

## 2015-09-27 NOTE — Progress Notes (Signed)
Pre visit review using our clinic review tool, if applicable. No additional management support is needed unless otherwise documented below in the visit note. 

## 2015-09-27 NOTE — Progress Notes (Signed)
HPI:  Here for CPE:  -Concerns and/or follow up today:   Obesity/HTN: -walking 30 minutes 3 days per week -diet ok per her report -taking losartan181m daily  ? OSA: -wants referral for this -snores at night, daytime somnelence, unrestful sleep and husband reports apneic spells -reports had home test remotely and told needs further overnight study  DDD - cervical with radiculopathy: -chronic, reports hx of DDD that responded well to PT -reports hx MRI and told needed NSU eval but did not do this as symptoms resolved with PT -we requested MRI results, but these are not available -reports intermittent pain and paresthesia R arm/hand for several months -denies weakness, numbness, malaise -requests special chair at work -wants PMR referral  Anxiety with cog changes: -mri and CBT advised, she did not do either -reports improved and does not want MRI  Hydradenitis: -chronic, has seen derm in the past and had inj and surgery -intermittent bilateral axillary boils  -Taking folic acid, vitamin D or calcium: no  -Diabetes and Dyslipidemia Screening: needs screening - FASTING  -Hx of HTN: no  -Vaccines: declined  -pap history:08/2015 - seeing gyn, HPV pos, s/p bx  -FDLMP: seeing gyn for menorrhagia, fibroids and ovarian cystand had ECC, s/p endometrial bxs and scheduled for [place] -sexual activity: yes, female partner, no new partners  -wants STI testing (Hep C if born [birth year]: no  -FH breast, colon or ovarian ca: see FH Last mammogram: sees gyn - had cbe 08/2015 with gyn and mmg scheduled Last colon cancer screening:  -Alcohol, Tobacco, drug use: see social history  Review of Systems - no fevers, unintentional weight loss, vision loss, hearing loss, chest pain, sob, hemoptysis, melena, hematochezia, hematuria, genital discharge, changing or concerning skin lesions, bleeding, bruising, loc, thoughts of self harm or SI  Past Medical History  Diagnosis Date  . Hypertension    . DUB (dysfunctional uterine bleeding)     around 2012, saw gyn at the time for eval  . Fibroid   . Abnormal Pap smear of cervix about 2013, 2016    Colpo biopsy with normal result per pt.  colpo 2016 HPV changes    Past Surgical History  Procedure Laterality Date  . Cholecystectomy  2004  . Breast reduction surgery  2005  . Incision and drainage Bilateral 2015    Bilateral axillary region ebaceous cyst    Family History  Problem Relation Age of Onset  . CVA Mother 361   deceased, was 363at the age of her first stroke  . Diabetes Father   . Deep vein thrombosis Father   . Lupus Sister     questionable diagnosis per patient    Social History   Social History  . Marital Status: Married    Spouse Name: N/A  . Number of Children: N/A  . Years of Education: N/A   Social History Main Topics  . Smoking status: Never Smoker   . Smokeless tobacco: Never Used  . Alcohol Use: None  . Drug Use: No  . Sexual Activity: Yes    Birth Control/ Protection: Other-see comments     Comment: vaginal film, spermicide gel   Other Topics Concern  . None   Social History Narrative   Work or School: SEducation officer, [employer]      Home Situation: lives with husband      Spiritual Beliefs: Baptist      Lifestyle: walks every other day; diet is bad  Current outpatient prescriptions:  .  valsartan (DIOVAN) 160 MG tablet, Take 1 tablet (160 mg total) by mouth daily., Disp: 90 tablet, Rfl: 3 .  clindamycin (CLINDAMAX) 1 % gel, Apply topically 2 (two) times daily. For 3 months, Disp: 30 g, Rfl: 2  EXAM:  Filed Vitals:   09/27/15 1428  BP: 130/82  Pulse: 85  Temp: 98.2 F (36.8 C)    GENERAL: vitals reviewed and listed below, alert, oriented, appears well hydrated and in no acute distress  HEENT: head atraumatic, PERRLA, normal appearance of eyes, ears, nose and mouth. moist mucus membranes.  NECK: supple, no masses or  lymphadenopathy  LUNGS: clear to auscultation bilaterally, no rales, rhonchi or wheeze  CV: HRRR, no peripheral edema or cyanosis, normal pedal pulses  BREAST:deferred, does with gyn  ABDOMEN: bowel sounds normal, soft, non tender to palpation, no masses, no rebound or guarding  GU: deferred, does with gyn  SKIN: no rash or abnormal lesions, 2 small papules L axillary region  MS: normal gait, moves all extremities normally  NEURO: CN II-XII grossly intact, normal muscle strength and sensation to light touch on extremities  PSYCH: normal affect, pleasant and cooperative  ASSESSMENT AND PLAN:  Discussed the following assessment and plan:  Visit for preventive health examination - Plan: Hemoglobin A1c -Discussed and advised all [place] preventive services health task force level A and B recommendations for age, sex and risks. -Advised at least 150 minutes of exercise per week and a healthy diet low in saturated fats and sweets and consisting of fresh fruits and vegetables, lean meats such as fish and [place] chicken and whole grains. -FASTING labs, studies and vaccines per orders this encounter  Essential hypertension - Plan: Basic metabolic panel  Obesity - Plan: Lipid panel, lifestyle recs  DDD (degenerative disc disease), cervical - Plan: Ambulatory referral to Physical Medicine Rehab Advised assistant to call prior PCP office and request MRI report.  OSA (obstructive sleep apnea) - Plan: Ambulatory referral to Pulmonology  Hydradenitis -appears mild on exam, though hx suggest more severe in the past. Opted for 3 month trial topical  clinda.  Menorrhagia with regular cycle Seeing gyn.  Her memory issues have resolved and likely were stress related. Discussed other potential etiologies and had advised MRI but she declined and opted to cancel as is no longer having issues.  Orders Placed This Encounter  Procedures  . Lipid panel  . Basic metabolic panel  . Hemoglobin A1c  .  Ambulatory referral to Physical Medicine Rehab    Referral Priority:  Routine    Referral Type:  Rehabilitation    Referral Reason:  Patient Preference    Requested Specialty:  Physical Medicine and Rehabilitation    Number of Visits Requested:  1  . Ambulatory referral to Pulmonology    Referral Priority:  Routine    Referral Type:  Consultation    Referral Reason:  Specialty Services Required    Requested Specialty:  Pulmonary Disease    Number of Visits Requested:  1    Patient advised to return to clinic immediately if symptoms worsen or persist or new concerns.  Patient Instructions  BEFORE YOU LEAVE: -labs -neck exercises -Wendie Simmer, can you call her prior doctor and request they fax MRI report of neck, any xrays, labs from last 2 years and most recent OV notes? Thanks. -follow up in 4 months  We recommend the following healthy lifestyle measures: - eat a healthy diet consisting of lots of vegetables, fruits,  beans, nuts, seeds, healthy meats such as [place] chicken and fish and whole grains.  - avoid fried foods, fast food, processed foods, sodas, red meet and other fattening foods.  - get a least 150 minutes of aerobic exercise per week.   -We have ordered labs or studies at this visit. It can take up to 1-2 weeks for results and processing. We will contact you with instructions IF your results are abnormal. Normal results will be released to your [facility]. If you have not heard from [place] or can not find your results in [facility] in 2 weeks please contact our office.  -get mammogram  -We placed a referral for you as discussed for your neck issues and for the sleep study. It usually takes about 1-2 weeks to process and schedule this referral. If you have not heard from [place] regarding this appointment in 2 weeks please contact our office.  -please use the topical antibiotic as instructed for the boils under the arms      No Follow-up on file.  Colin Benton R.

## 2015-09-27 NOTE — Patient Instructions (Addendum)
BEFORE YOU LEAVE: -labs -neck exercises -Wendie Simmer, can you call her prior doctor and request they fax MRI report of neck, any xrays, labs from last 2 years and most recent OV notes? Thanks. -follow up in 4 months  We recommend the following healthy lifestyle measures: - eat a healthy diet consisting of lots of vegetables, fruits, beans, nuts, seeds, healthy meats such as [place] chicken and fish and whole grains.  - avoid fried foods, fast food, processed foods, sodas, red meet and other fattening foods.  - get a least 150 minutes of aerobic exercise per week.   -We have ordered labs or studies at this visit. It can take up to 1-2 weeks for results and processing. We will contact you with instructions IF your results are abnormal. Normal results will be released to your [facility]. If you have not heard from [place] or can not find your results in [facility] in 2 weeks please contact our office.  -get mammogram  -We placed a referral for you as discussed for your neck issues and for the sleep study. It usually takes about 1-2 weeks to process and schedule this referral. If you have not heard from [place] regarding this appointment in 2 weeks please contact our office.  -please use the topical antibiotic as instructed for the boils under the arms

## 2015-09-28 ENCOUNTER — Telehealth: Payer: Self-pay | Admitting: *Deleted

## 2015-09-28 ENCOUNTER — Encounter: Payer: 59 | Admitting: Family Medicine

## 2015-09-28 ENCOUNTER — Telehealth: Payer: Self-pay | Admitting: Family Medicine

## 2015-09-28 ENCOUNTER — Encounter: Payer: Self-pay | Admitting: Family Medicine

## 2015-09-28 NOTE — Telephone Encounter (Signed)
Patient called back and I informed her per Dr Maudie Mercury she received the records and a copy of the MRI was mailed to her home address to take to the specialist and she agreed.  Patient states Dr Maudie Mercury needs to change the wording on the note for her chair as she needs this due to back pain and should know this after reviewing the notes. Patient is aware Dr Maudie Mercury is out of the office and will address this on Thursday.

## 2015-09-28 NOTE — Telephone Encounter (Signed)
Pt states the referral you put in for the sleep study, they cannot see her until jan, 2017. Pt would like to know if you will refer to [facility] instead.

## 2015-09-29 ENCOUNTER — Telehealth: Payer: Self-pay | Admitting: Obstetrics and Gynecology

## 2015-09-29 NOTE — Telephone Encounter (Signed)
Patient called and cancelled her ultrasound for 10/13/15 due to a meeting a work. She said she will call back to reschedule.

## 2015-09-29 NOTE — Telephone Encounter (Signed)
Left a message for the pt to return my call.  

## 2015-09-29 NOTE — Telephone Encounter (Signed)
The letter is based on what is going on now and what she told me - neck pain. She did not talk about back pain at her visit. IF she now is reporting she also has chronic back pain - please add that to the letter.

## 2015-09-29 NOTE — Telephone Encounter (Signed)
Please keep patient in imaging hold.  She has a large ovarian cyst and needs to return for pelvic ultrasound here.

## 2015-09-30 ENCOUNTER — Telehealth: Payer: Self-pay | Admitting: *Deleted

## 2015-09-30 ENCOUNTER — Encounter: Payer: Self-pay | Admitting: *Deleted

## 2015-09-30 DIAGNOSIS — G4733 Obstructive sleep apnea (adult) (pediatric): Secondary | ICD-10-CM

## 2015-09-30 NOTE — Telephone Encounter (Signed)
Patient informed of the message per Dr Maudie Mercury and order placed and she is aware someone will call with appt info.

## 2015-09-30 NOTE — Telephone Encounter (Signed)
Patient informed of the message below and states she does have chronic back pain and this was added to the letter and mailed to her home address.

## 2015-10-05 ENCOUNTER — Encounter: Payer: 59 | Admitting: Family Medicine

## 2015-10-05 NOTE — Telephone Encounter (Signed)
Call to patient. She offered to schedule patient for follow up ultrasound for ovarian cyst here in office. Patient declines to schedule at this time and states she will call back.  Will close encounter.  Imaging hold placed.

## 2015-10-10 ENCOUNTER — Ambulatory Visit
Admission: RE | Admit: 2015-10-10 | Discharge: 2015-10-10 | Disposition: A | Discharge status: Home / Self Care | Payer: 59 | Source: Ambulatory Visit

## 2015-10-10 DIAGNOSIS — Z1231 Encounter for screening mammogram for malignant neoplasm of breast: Secondary | ICD-10-CM

## 2015-10-13 ENCOUNTER — Encounter: Payer: Self-pay | Admitting: Neurology

## 2015-10-13 ENCOUNTER — Ambulatory Visit (INDEPENDENT_AMBULATORY_CARE_PROVIDER_SITE_OTHER): Payer: 59 | Admitting: Neurology

## 2015-10-13 ENCOUNTER — Other Ambulatory Visit: Payer: 59

## 2015-10-13 ENCOUNTER — Other Ambulatory Visit: Payer: 59 | Admitting: Obstetrics and Gynecology

## 2015-10-13 VITALS — BP 138/100 | HR 76 | Resp 20 | Ht 64.0 in | Wt 186.0 lb

## 2015-10-13 DIAGNOSIS — G4710 Hypersomnia, unspecified: Secondary | ICD-10-CM | POA: Diagnosis not present

## 2015-10-13 DIAGNOSIS — M5030 Other cervical disc degeneration, unspecified cervical region: Secondary | ICD-10-CM | POA: Diagnosis not present

## 2015-10-13 DIAGNOSIS — R0683 Snoring: Secondary | ICD-10-CM | POA: Insufficient documentation

## 2015-10-13 DIAGNOSIS — G4730 Sleep apnea, unspecified: Secondary | ICD-10-CM | POA: Diagnosis not present

## 2015-10-13 NOTE — Addendum Note (Signed)
Addended by: Larey Seat on: 10/13/2015 10:23 AM   Modules accepted: Orders

## 2015-10-13 NOTE — Patient Instructions (Signed)
Sleep Apnea Sleep apnea is disorder that affects a person's sleep. A person with sleep apnea has abnormal pauses in their breathing when they sleep. It is hard for them to get a good sleep. This makes a person tired during the day. It also can lead to other physical problems. There are three types of sleep apnea. One type is when breathing stops for a short time because your airway is blocked (obstructive sleep apnea). Another type is when the brain sometimes fails to give the normal signal to breathe to the muscles that control your breathing (central sleep apnea). The third type is a combination of the other two types. HOME CARE  Do not sleep on your back. Try to sleep on your side.  Take all medicine as told by your doctor.  Avoid alcohol, calming medicines (sedatives), and depressant drugs.  Try to lose weight if you are overweight. Talk to your doctor about a healthy weight goal. Your doctor may have you use a device that helps to open your airway. It can help you get the air that you need. It is called a positive airway pressure (PAP) device. There are three types of PAP devices:  Continuous positive airway pressure (CPAP) device.  Nasal expiratory positive airway pressure (EPAP) device.  Bilevel positive airway pressure (BPAP) device. MAKE SURE YOU:  Understand these instructions.  Will watch your condition.  Will get help right away if you are not doing well or get worse.   This information is not intended to replace advice given to you by your health care provider. Make sure you discuss any questions you have with your health care provider.   Document Released: 09/25/2008 Document Revised: 01/07/2015 Document Reviewed: 04/19/2012 Elsevier Interactive Patient Education [employer].

## 2015-10-13 NOTE — Progress Notes (Addendum)
SLEEP MEDICINE CLINIC   Provider:  Larey Seat, M D  Referring Provider: Lucretia Kern, DO Primary Care Physician:  Lucretia Kern., DO  Chief Complaint  Patient presents with  . New Patient (Initial Visit)    snoring, daytime fatigue, memory issues, rm 11, alone    HPI:  Melanie Little is a 44 y.o. female , seen here as a referral from Colin Benton, DO  for a sleep evaluation,    Melanie Little is  a married African-American and left-handed female, presents today with a concern of excessive daytime fatigue and associated poor and nonrestorative sleep. She is especially concerned because her ability to focus seems to be impaired and sometimes she appears more forgetful than she could attribute to age or any other medical factors. She endorsed today the fatigue severity scale at 43 points and the Epworth sleepiness score at 9 points her husband has told her that she snores. Her husband now also has noted her to have apneas. He witnessed some. She feels that she doesn't get enough sleep and not restorative sleep in general. Risk factors are recent weight gain more than 20 pounds over the last 24 month. When she traveled with her sister last year, the to women share the bedroom. Her snoring was recorded by her roommate. Her cardiologist also advised her that her blood pressure was not responding to several drugs as well that this may be related to an underlying condition of obstructive sleep apnea. She spoke to her primary care physician, Dr. Blima Singer,  to be referred here to [facility] since her husband is also a patient here.  3 years ago , while the patient lived in [place], her primary care physician Dr. Teryl Lucy ordered a home sleep test she brought me a copy of the results. She was actually not diagnosed with significant apnea in Summer 2013 her AHI was 3 and her RDI was 9. He listed the following diagnosis in her history; excessive daytime sleepiness, essential  hypertension on more than 2 drugs, she does not have a history of congestive heart disease, diabetes or cardiovascular disease.   Sleep habits are as follows: The patient onset my question about her preferred bedtime as "anytime I can".  She returns from work between 5:30 and 6 PM and often likes to take a nap to get refreshed. These naps last 1 hour. She does not recall any dreams during naps. She fights dozing off in the evening hours and has sometimes trouble to stay awake watching a TV show for example. She usually transfers to the bedroom between 8:30 and 9 PM, she likes to watch TV in the bedroom. She will switch the TV off before falling asleep. The bedroom is otherwise cool, quiet and dark. She shares the bed with her husband. She has to go to the bathroom to urinate 5-6 times each night fragmenting her sleep. She estimates getting less than 5 hours of sleep and an average night. She does recall dreaming at nighttime. No nightmares and not specifically vivid dreams were reported. She rises at 6:50 AM . Usually listens to the news before she gets up, she wakes up spontaneously before the alarm rings. She sleeps on one pillow and prefers sleeping on the side, she does not have a sleep problem. When she wakes up she usually is still in the lateral recumbent position. Her breakfast is usually to go smoothly or yogurt she does not drink coffee or caffeine at it beverages  at all. She drinks water and juice. At work she is without any access to natural daylight, working  in office. 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    Sleep medical history and family sleep history:  He reports 2 isolated incidents of sleepwalking at age 47. No night terrors, there is no diagnosed family member in relation to apnea or any other sleep disorder.   Social history:  Non smoker, non drinker- rarely wine. No shift work history, married, with an 65 year old son.   Review  of Systems: Out of a complete 14 system review, the patient complains of only the following symptoms, and all other reviewed systems are negative.  snoring, sleepiness, fatigue. Nocturia .  Epworth score 9 , Fatigue severity score 45 , depression score n/a    Social History   Social History  . Marital Status: Married    Spouse Name: N/A  . Number of Children: N/A  . Years of Education: N/A   Occupational History  . [employer]    Social History Main Topics  . Smoking status: Never Smoker   . Smokeless tobacco: Never Used  . Alcohol Use: 0.0 oz/week    0 Standard drinks or equivalent per week     Comment: very rare  . Drug Use: No  . Sexual Activity: Yes    Birth Control/ Protection: Other-see comments     Comment: vaginal film, spermicide gel   Other Topics Concern  . Not on file   Social History Narrative   Work or School: Education officer, 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[employer]      Home Situation: lives with husband      Spiritual Beliefs: Baptist      Lifestyle: walks every other day; diet is bad          Family History  Problem Relation Age of Onset  . CVA Mother 93    deceased, was 15 at the age of her first stroke  . Diabetes Father   . Deep vein thrombosis Father   . Lupus Sister     questionable diagnosis per patient    Past Medical History  Diagnosis Date  . Hypertension   . DUB (dysfunctional uterine bleeding)     around 2012, saw gyn at the time for eval  . Fibroid   . Abnormal Pap smear of cervix about 2013, 2016    Colpo biopsy with normal result per pt.  colpo 2016 HPV changes    Past Surgical History  Procedure Laterality Date  . Cholecystectomy  2004  . Breast reduction surgery  2005  . Incision and drainage Bilateral 2015    Bilateral axillary region ebaceous cyst    Current Outpatient Prescriptions  Medication Sig Dispense Refill  . clindamycin (CLINDAMAX) 1 % gel Apply topically 2 (two) times daily.  For 3 months 30 g 2  . valsartan (DIOVAN) 160 MG tablet Take 1 tablet (160 mg total) by mouth daily. 90 tablet 3   No current facility-administered medications for this visit.    Allergies as of 10/13/2015  . (No Known Allergies)    Vitals: BP 138/100 mmHg  Pulse 76  Resp 20  Ht 5' 4"  (1.626 m)  Wt 186 lb (84.369 kg)  BMI 31.91 kg/m2  LMP 09/11/2015 (Exact Date) Last Weight:  Wt Readings from Last 1 Encounters:  10/13/15 186 lb (84.369 kg)   GDJ:MEQA mass index is 31.91 kg/(m^2).  Last Height:   Ht Readings from Last 1 Encounters:  10/13/15 5' 4"  (1.626 m)    Physical exam:  General: The patient is awake, alert and appears not in acute distress. The patient is well groomed. Head: Normocephalic, atraumatic. Neck is supple. Mallampati 3,  neck circumference:15.5 . Nasal airflow unrestricted , but she has alergies, TMJ is not  evident . Retrognathia is seen.  Cardiovascular:  Regular rate and rhythm  without  murmurs or carotid bruit, and without distended neck veins. Respiratory: Lungs are clear to auscultation. Skin:  Without evidence of edema, or rash Trunk: BMI is elevated . Neurologic exam : The patient is awake and alert, oriented to place and time.   Memory subjective described as intact.   Attention span & concentration ability appears normal.  Speech is fluent,  without dysarthria, dysphonia or aphasia.  Mood and affect are appropriate.  Cranial nerves: Pupils are equal and briskly reactive to light. Funduscopic exam without  evidence of pallor or edema. Extraocular movements  in vertical and horizontal planes intact and without nystagmus. Visual fields by finger perimetry are intact. Hearing to finger rub intact.   Facial sensation intact to fine touch.  Facial motor strength is symmetric and tongue and uvula move midline. Shoulder shrug was symmetrical.   Motor exam:  Normal tone, muscle bulk and symmetric strength in all extremities. Sensory:  Fine touch,  pinprick and vibration were tested in all extremities. Proprioception was normal. Coordination: Rapid alternating movements in the fingers/hands was normal. Finger-to-nose maneuver normal without evidence of ataxia, dysmetria or tremor. Gait and station: Patient walks without assistive device and is able unassisted to climb up to the exam table. Strength within normal limits.  Stance is stable and normal.  Romberg testing is negative.  Deep tendon reflexes: in the  upper and lower extremities are symmetric and intact. Babinski maneuver response is  downgoing.  The patient was advised of the nature of the diagnosed sleep disorder , the treatment options and risks for general a health and wellness arising from not treating the condition.  I spent more than 40 minutes of face to face time with the patient. Greater than 50% of time was spent in counseling and coordination of care. We have discussed the diagnosis and differential and I answered the patient's questions.     Assessment:  After physical and neurologic examination, review of laboratory studies,  Personal review of imaging studies, reports of other /same  Imaging studies ,  Results of polysomnography/ neurophysiology testing and pre-existing records as far as provided in visit., my assessment is   1) Mrs. Alberts was suspected to have sleep apnea even 3 years ago but a home sleep test did not reveal this to be true. In the meantime she has gained about 25 pounds and her husband and her cousin has noted her to snore loudly her husband has witnessed apneas as well. This development has been associated with increasing daytime sleepiness and fatigue and she feels also that her memory is impaired she is more forgetful l, less able to concentrate .   2) she seems to have allergic rhinitis and recently had some postnasal drip and phlegm that she noticed in the morning. I would recommend over-the-counter to use Afrin about 20-30 minutes before bedtime  and then use an allergy medicine at the time she goes to bed. This may help her to avoid mouth breathing and keep the nasal passage open. Afrin should not be used more than 5  days in a row and a period of 5 days of use should be alternated with at least 2 days of not using it.  3)  Weight gain, : weight reduction through low carb diet.   4) the patient does not report any sleep related headaches but she does have catamenial migraines.  5) the patient has right ( non dominant ) hand grip strength loss and numbness, and neck pain. I will order a NCS and EMG with Dr Jaynee Eagles. MD  [facility] copy report [facility]>    Plan:  Treatment plan and additional workup : Split study with AHi 20, score 4% , RV after sleep study.  NCS    Asencion Partridge Binnie Vonderhaar MD  10/13/2015   CC: Lucretia Kern, Do [address] [place], [place] [address]

## 2015-10-21 ENCOUNTER — Encounter: Payer: Self-pay | Admitting: *Deleted

## 2015-10-26 ENCOUNTER — Ambulatory Visit (INDEPENDENT_AMBULATORY_CARE_PROVIDER_SITE_OTHER): Payer: Self-pay | Admitting: Neurology

## 2015-10-26 ENCOUNTER — Ambulatory Visit (INDEPENDENT_AMBULATORY_CARE_PROVIDER_SITE_OTHER): Payer: 59 | Admitting: Neurology

## 2015-10-26 DIAGNOSIS — G4730 Sleep apnea, unspecified: Secondary | ICD-10-CM

## 2015-10-26 DIAGNOSIS — R202 Paresthesia of skin: Secondary | ICD-10-CM | POA: Diagnosis not present

## 2015-10-26 DIAGNOSIS — G4710 Hypersomnia, unspecified: Secondary | ICD-10-CM

## 2015-10-26 DIAGNOSIS — Z0289 Encounter for other administrative examinations: Secondary | ICD-10-CM

## 2015-10-26 DIAGNOSIS — M5030 Other cervical disc degeneration, unspecified cervical region: Secondary | ICD-10-CM

## 2015-10-26 DIAGNOSIS — R0683 Snoring: Secondary | ICD-10-CM

## 2015-10-26 NOTE — Progress Notes (Signed)
EMG nerve conduction study today is normal, there is no evidence of right upper extremity neuropathy, or right cervical radiculopathy.

## 2015-10-26 NOTE — Procedures (Signed)
   NCS (NERVE CONDUCTION STUDY) WITH EMG (ELECTROMYOGRAPHY) REPORT   STUDY DATE: October 26 2015 PATIENT NAME: Melanie Little DOB: 1971-01-05 MRN: [id number]    TECHNOLOGIST: Laretta Alstrom ELECTROMYOGRAPHER: Marcial Pacas M.D.  CLINICAL INFORMATION:  44 years old right-handed female with history of chronic neck pain, presenting with 3 months history of intermittent bilateral hands paresthesia, woke her up from sleep.  On examination: Bilateral upper extremity proximal and distal strength is normal, in specific, bilateral grip, abductor pollicis brevis, opponens muscle strength was normal. Deep tendon reflexes were normal and symmetric. There was no wrist Tinel signs.  FINDINGS: NERVE CONDUCTION STUDY: Bilateral median, ulnar sensory and motor responses were normal.  NEEDLE ELECTROMYOGRAPHY: Selective needle examination was performed at right upper extremity muscles, right cervical paraspinal muscles.  Needle examination of right pronator teres, extensor digitorum communis, biceps, triceps, deltoid was normal.  There was no spontaneous activity at right cervical paraspinal muscles, right C5-6 and 7.  IMPRESSION:   This was a normal study. There was no electrical diagnostic evidence of right upper extremity neuropathy, or right cervical radiculopathy.  INTERPRETING PHYSICIAN:   Marcial Pacas M.D. Ph.D. [facility] [address] [place], [place] [address] [phone]

## 2015-11-01 ENCOUNTER — Telehealth: Payer: Self-pay

## 2015-11-01 ENCOUNTER — Telehealth: Payer: Self-pay | Admitting: Obstetrics and Gynecology

## 2015-11-01 NOTE — Telephone Encounter (Signed)
-----   Message from Larey Seat, MD sent at 11/01/2015  4:34 PM EDT ----- Normal EMG and NCV study. Right upper extremity.

## 2015-11-01 NOTE — Telephone Encounter (Signed)
Pt was advised that her EMG and NCV was normal. Pt verbalized understanding.

## 2015-11-01 NOTE — Telephone Encounter (Signed)
Patient calling about a 30-day letter she received. She has some follow up questions as to the urgency of scheduling the appointment.

## 2015-11-02 NOTE — Telephone Encounter (Signed)
Return call patient. She states she does not understand the urgency behind the certified letter requiring ultrasound appointment.  Advised that pelvic ultrasound has been ordered as follow-up for large ovarian cyst and possible need for surgical intervention.  Patient states she has had this for 3 years and doesn't understand why we are insisting she have this done so soon. Advised this was initially ordered on 08-18-15 and although it may have been present before, this was not during our care and we cannot assume responsibility for her care if she doesn't complete follow-up. Patient states she has had her other testing and nothing has been wrong. She doesn't understand why we "insisted on colpo  Instead of three month pap" like she has always done in the past. During this sentence, I brought Rolla Etienne RN into room to listen to remainder of call. Advised we follow ASCCP guidelines and  that although we cant speak to previous management, colpo was recommended by our practice based on pap and ASCCP guidelines and this is not for evaluation for her ovarian cyst. These are unrelated issues.  Patient states she feels "it doesn't matter what patient wants" and then thanked me for our service, stated she would be seeking care elsewhere and disconnected call.  Routing to provider for final review.

## 2015-11-02 NOTE — Telephone Encounter (Signed)
Please schedule the ultrasound after precert has been complete.  [place]

## 2015-11-02 NOTE — Telephone Encounter (Signed)
Patient has changed her mind and would like to make an ultrasound appointment. Patient is aware Jacqlyn Larsen has left the office.

## 2015-11-03 ENCOUNTER — Institutional Professional Consult (permissible substitution): Payer: 59 | Admitting: Neurology

## 2015-11-03 NOTE — Telephone Encounter (Signed)
Return call to patient, left message to call back. Can also speak with triage nurse to schedule if I am not available.

## 2015-11-03 NOTE — Telephone Encounter (Signed)
Returned call

## 2015-11-03 NOTE — Telephone Encounter (Signed)
Spoke with patient. Ultrasound scheduled for 11/10/2015 at 11:00 am with 11:30 am consult with Dr.Silva. Agreeable to date and time. Will need to know benefits. Please also let her know of our cancellation policy.  Cc: Theresia Lo

## 2015-11-03 NOTE — Telephone Encounter (Signed)
Thank you for the update!

## 2015-11-04 ENCOUNTER — Ambulatory Visit (INDEPENDENT_AMBULATORY_CARE_PROVIDER_SITE_OTHER): Payer: 59 | Admitting: Neurology

## 2015-11-04 ENCOUNTER — Telehealth: Payer: Self-pay | Admitting: Obstetrics and Gynecology

## 2015-11-04 DIAGNOSIS — G4733 Obstructive sleep apnea (adult) (pediatric): Secondary | ICD-10-CM

## 2015-11-04 DIAGNOSIS — G4730 Sleep apnea, unspecified: Secondary | ICD-10-CM

## 2015-11-04 DIAGNOSIS — R0683 Snoring: Secondary | ICD-10-CM

## 2015-11-04 DIAGNOSIS — G4710 Hypersomnia, unspecified: Secondary | ICD-10-CM

## 2015-11-04 NOTE — Telephone Encounter (Signed)
Called patient to review benefits for procedure. Left voicemail to call back and review.

## 2015-11-04 NOTE — Telephone Encounter (Signed)
Spoke with patient. Reviewed benefit. Patient understands and agreeable. Reviewed arrival date/time. Patient agreeable. Reviewed 72 hour cancellation policy with $312 fee. Patient understands and agreeable. No further questions. Ok to close.

## 2015-11-05 NOTE — Sleep Study (Signed)
Please see the scanned sleep study interpretation located in the procedure tab within the chart review section.   

## 2015-11-10 ENCOUNTER — Ambulatory Visit (INDEPENDENT_AMBULATORY_CARE_PROVIDER_SITE_OTHER): Payer: 59

## 2015-11-10 ENCOUNTER — Ambulatory Visit (INDEPENDENT_AMBULATORY_CARE_PROVIDER_SITE_OTHER): Payer: 59 | Admitting: Obstetrics and Gynecology

## 2015-11-10 ENCOUNTER — Encounter: Payer: Self-pay | Admitting: Obstetrics and Gynecology

## 2015-11-10 VITALS — BP 138/82 | HR 76 | Ht 63.5 in | Wt 186.0 lb

## 2015-11-10 DIAGNOSIS — R888 Abnormal findings in other body fluids and substances: Secondary | ICD-10-CM | POA: Diagnosis not present

## 2015-11-10 DIAGNOSIS — N83202 Unspecified ovarian cyst, left side: Secondary | ICD-10-CM | POA: Diagnosis not present

## 2015-11-10 DIAGNOSIS — IMO0002 Reserved for concepts with insufficient information to code with codable children: Secondary | ICD-10-CM

## 2015-11-10 DIAGNOSIS — D259 Leiomyoma of uterus, unspecified: Secondary | ICD-10-CM

## 2015-11-10 DIAGNOSIS — B977 Papillomavirus as the cause of diseases classified elsewhere: Secondary | ICD-10-CM

## 2015-11-10 NOTE — Progress Notes (Signed)
Subjective  44 y.o. [id number]  [place] female here for pelvic ultrasound for recheck of left ovarian cyst.   Patient not happy about getting a certified letter recommending follow up evaluation within 30 days. She cancelled her ultrasound appointment for a recheck of the left ovary and had not rescheduled until we sent this letter. She had a very large left ovarian cyst, 7.5 cm in 2014 noted on CT scan and ultrasound consistent with possible dermoid, and she did not do any follow up on this.  Similarly we did an ultrasound here on 08/18/15 showing an left ovarian cyst: 62 x 68 mm thick walled cyst with echogenic fluid with hemorrhagic cyst versus other etiology. No abnormal blood flow.  Patient did not return for the 6 week follow up ultrasound until now.  She stated by phone that she would reschedule this appointment.   States we at [facility] are like car mechanics - "we keep checking one thing after another."  We also state give reminders for appointments like telemarketers  - automatic phone call, text, and personal calls.   Asking why we had to evaluate her abnormal pap how we did, when she just had a repeat pap in the past.  Patient has a series of gynecologic issues - abnormal pap and HR HPV, fibroids, left adnexal mass. She had underdone colposcopy with biopsy showing HPV effect only.  Her ultrasound has shown multiple fibroids and the left ovarian mass.  An endometrial biopsy for menorrhagia showed benign proliferative endometrium.   Lab tech in the room with patient and me for a brief portion of her visit to draw a CA125. She had to try both arms and was unsuccessful due to dehydration of the patient. During this time, the patient continued to share her dissatisfaction and asked for me to engage in conversation and complete the consultation and recommendations as she had to get back to work.  I told her that I would wait until the lab technician was done.  Please  note that I entered the room for the patient's visit 10 minutes after her scheduled appointment with me.   Objective  Pelvic ultrasound images and report reviewed with patient.  Uterus - multiple fibroids, no change.  EMS - 6.18 mm.  Ovaries - left ovary previously with cyst 62 mm x 48 mm x 68 mm.  Today, left hemorrhagic cyst measures 46 mm  x 35 mm x 53 mm.  Right ovary normal - multiple follicles. Free fluid - no    Assessment   Left ovarian cyst.  Likely a persistent cyst since 2014.  Appears to be slightly smaller than our previous ultrasound.  I suspect that this is an endometrioma or possible a cystadenoma.  There are no features currently to suggest a borderline or malignant tumor.  Uterine fibroids.  Abnormal pap and positive HR HPV.  Colposcopy showing HPV effect.   Plan CA125 drawn again in the lab after oral hydration done.  Patient understands that this CA125 can be falsely elevated in premenopausal women.  The result may determine that she needs to see a GYN subspecialist if it indicates potential premalignant or malignant possibility.  If CA125 is normal, I would recommend follow up in 6 months as this is essentially a 3 month recheck.  Repeat pap and HR HPV is due by August 2017. Comprehensive discussion regarding ovarian cysts, fibroids, menorrhagia in women over the age of 46, and abnormal paps and high risk HPV. I re-educated  patient again that in the field of medicine, all of these are separate diagnoses, which are all evaluated separately and by scientific protocols which are to ensure her safety and are desiged to rule out premalignant and malignant lesions and insure proper treatment. She has indeed had many diagnoses requiring evaluation to date.  I apologized that she required our strong recommendation in the form of a certified letter for follow up of the left ovarian cyst, but it was because she did not follow up as we had originally discussed and scheduled. This  is done to protect her and to protect [place] in providing good and safe care in our professional relationship. Our job is to educate patients in their medical status, and we do not expect her to have the medical fund of knowledge for protocols for gynecologic care. I did share with her that sometimes the professional relationship between a patient and an office is not a good fit, and that she could choose to make that choice.   __25_____ minutes face to face time of which over 50% was spent in counseling.   After visit summary to patient.

## 2015-11-11 ENCOUNTER — Other Ambulatory Visit: Payer: Self-pay | Admitting: Obstetrics and Gynecology

## 2015-11-11 ENCOUNTER — Telehealth: Payer: Self-pay | Admitting: Neurology

## 2015-11-11 DIAGNOSIS — N83202 Unspecified ovarian cyst, left side: Secondary | ICD-10-CM

## 2015-11-11 DIAGNOSIS — G4733 Obstructive sleep apnea (adult) (pediatric): Secondary | ICD-10-CM

## 2015-11-11 LAB — CA 125: CA 125: 31 U/mL (ref <35)

## 2015-11-11 NOTE — Telephone Encounter (Signed)
Pt called requesting sleep study results. Please call and advise

## 2015-11-11 NOTE — Telephone Encounter (Signed)
Cyril Mourning will call on Monday, CD/

## 2015-11-14 ENCOUNTER — Telehealth: Payer: Self-pay

## 2015-11-14 NOTE — Telephone Encounter (Signed)
I called pt back and explained that I do not have her sleep study results yet. Sometimes, it takes 10-14 days for results to come back. I advised her that I would call her as soon as I had her results. Pt verbalized understanding.

## 2015-11-14 NOTE — Telephone Encounter (Signed)
-----   Message from Nunzio Cobbs, MD sent at 11/11/2015  6:55 AM EST ----- Results to patient through My Chart. Next pelvic ultrasound in 6 months.

## 2015-11-14 NOTE — Telephone Encounter (Signed)
Returned patient's call and per her request, left detailed message stating lab normal and she can access this information on MyChart.  This message was left on [phone].

## 2015-11-14 NOTE — Telephone Encounter (Signed)
Called patient at [phone] to discuss results of Ca-125, left message on voicemail to call me.

## 2015-11-14 NOTE — Telephone Encounter (Signed)
Patient returned Amanda's call. She said, "Please tell her I am at work and to just leave a detailed message on my voicemail."

## 2015-11-21 NOTE — Telephone Encounter (Signed)
Spoke to pt regarding her sleep study results. I advised her that her study revealed osa and that Dr. Brett Fairy recommends starting a CPAP. Pt is willing to start CPAP. I advised her to use it at least four or more hours per night. I will send her order to [facility]. A follow up appt was made for 1/24 at 1:30. Pt verbalized understanding.

## 2015-11-21 NOTE — Addendum Note (Signed)
Addended by: Lester Deweyville A on: 11/21/2015 03:44 PM   Modules accepted: Orders

## 2015-12-22 ENCOUNTER — Encounter: Payer: Self-pay | Admitting: Physical Medicine & Rehabilitation

## 2016-01-12 ENCOUNTER — Encounter: Payer: 59 | Attending: Physical Medicine & Rehabilitation | Admitting: Physical Medicine & Rehabilitation

## 2016-01-12 ENCOUNTER — Encounter: Payer: Self-pay | Admitting: Physical Medicine & Rehabilitation

## 2016-01-12 VITALS — BP 165/102 | HR 88

## 2016-01-12 DIAGNOSIS — G894 Chronic pain syndrome: Secondary | ICD-10-CM

## 2016-01-12 DIAGNOSIS — M542 Cervicalgia: Secondary | ICD-10-CM | POA: Diagnosis present

## 2016-01-12 DIAGNOSIS — M79601 Pain in right arm: Secondary | ICD-10-CM | POA: Diagnosis present

## 2016-01-12 DIAGNOSIS — M25511 Pain in right shoulder: Secondary | ICD-10-CM

## 2016-01-12 DIAGNOSIS — Z5181 Encounter for therapeutic drug level monitoring: Secondary | ICD-10-CM | POA: Diagnosis not present

## 2016-01-12 DIAGNOSIS — N938 Other specified abnormal uterine and vaginal bleeding: Secondary | ICD-10-CM | POA: Diagnosis not present

## 2016-01-12 DIAGNOSIS — Z833 Family history of diabetes mellitus: Secondary | ICD-10-CM | POA: Diagnosis not present

## 2016-01-12 DIAGNOSIS — Z79899 Other long term (current) drug therapy: Secondary | ICD-10-CM | POA: Diagnosis not present

## 2016-01-12 DIAGNOSIS — I10 Essential (primary) hypertension: Secondary | ICD-10-CM | POA: Insufficient documentation

## 2016-01-12 DIAGNOSIS — D259 Leiomyoma of uterus, unspecified: Secondary | ICD-10-CM | POA: Insufficient documentation

## 2016-01-12 DIAGNOSIS — R202 Paresthesia of skin: Secondary | ICD-10-CM | POA: Insufficient documentation

## 2016-01-12 DIAGNOSIS — Z8249 Family history of ischemic heart disease and other diseases of the circulatory system: Secondary | ICD-10-CM | POA: Diagnosis not present

## 2016-01-12 DIAGNOSIS — Z823 Family history of stroke: Secondary | ICD-10-CM | POA: Insufficient documentation

## 2016-01-12 MED ORDER — IBUPROFEN 600 MG PO TABS: 600.0000 mg | ORAL_TABLET | Freq: Three times a day (TID) | ORAL | Status: DC
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
## 2016-01-20 ENCOUNTER — Ambulatory Visit (HOSPITAL_COMMUNITY)
Admission: RE | Admit: 2016-01-20 | Discharge: 2016-01-20 | Disposition: A | Discharge status: Home / Self Care | Payer: 59 | Source: Ambulatory Visit | Attending: Physical Medicine & Rehabilitation | Admitting: Physical Medicine & Rehabilitation

## 2016-01-20 DIAGNOSIS — G894 Chronic pain syndrome: Secondary | ICD-10-CM

## 2016-01-20 DIAGNOSIS — M25511 Pain in right shoulder: Secondary | ICD-10-CM | POA: Insufficient documentation

## 2016-01-20 LAB — TOXASSURE SELECT,+ANTIDEPR,UR: PDF: 0

## 2016-01-23 NOTE — Progress Notes (Signed)
Urine drug screen for this encounter is consistent for prescribed medication 

## 2016-01-24 ENCOUNTER — Ambulatory Visit: Payer: Self-pay | Admitting: Neurology

## 2016-02-09 ENCOUNTER — Encounter: Payer: 59 | Admitting: Physical Medicine & Rehabilitation

## 2016-03-01 ENCOUNTER — Ambulatory Visit (INDEPENDENT_AMBULATORY_CARE_PROVIDER_SITE_OTHER): Payer: 59 | Admitting: Neurology

## 2016-03-01 ENCOUNTER — Encounter: Payer: Self-pay | Admitting: Neurology

## 2016-03-01 VITALS — BP 144/102 | HR 90 | Resp 20 | Ht 64.0 in | Wt 187.0 lb

## 2016-03-01 DIAGNOSIS — Z9989 Dependence on other enabling machines and devices: Principal | ICD-10-CM

## 2016-03-01 DIAGNOSIS — G4733 Obstructive sleep apnea (adult) (pediatric): Secondary | ICD-10-CM

## 2016-03-01 NOTE — Progress Notes (Signed)
SLEEP MEDICINE CLINIC   Provider:  Larey Seat, M D  Referring Provider: Lucretia Kern, DO Primary Care Physician:  Lucretia Kern., DO  Chief Complaint  Patient presents with  . Follow-up    cpap, going ok, rm 11, alone    HPI:  Melanie Little is a 45 y.o. female , seen here as a referral from Colin Benton, DO  for a sleep evaluation,    Melanie Little is  a married African-American and left-handed female, presents today with a concern of excessive daytime fatigue and associated poor and nonrestorative sleep. She is especially concerned because her ability to focus seems to be impaired and sometimes she appears more forgetful than she could attribute to age or any other medical factors. She endorsed today the fatigue severity scale at 43 points and the Epworth sleepiness score at 9 points her husband has told her that she snores. Her husband now also has noted her to have apneas. He witnessed some. She feels that she doesn't get enough sleep and not restorative sleep in general. Risk factors are recent weight gain more than 20 pounds over the last 24 month. When she traveled with her sister last year, the to women share the bedroom. Her snoring was recorded by her roommate. Her cardiologist also advised her that her blood pressure was not responding to several drugs as well that this may be related to an underlying condition of obstructive sleep apnea. She spoke to her primary care physician, . Blima Singer, DO  to be referred here to [facility] since her husband is also a patient here. 3 years ago , while the patient lived in [place], her primary care physician Dr. Teryl Lucy ordered a home sleep test she brought me a copy of the results. She was actually not diagnosed with significant apnea in Summer 2013 her AHI was 3 and her RDI was 9. He listed the following diagnosis in her history; excessive daytime sleepiness, essential hypertension on more than 2 drugs, she does not  have a history of congestive heart disease, diabetes or cardiovascular disease.  Sleep habits are as follows: The patient onset my question about her preferred bedtime as "anytime I can".  She returns from work between 5:30 and 6 PM and often likes to take a nap to get refreshed. These naps last 1 hour. She does not recall any dreams during naps. She fights dozing off in the evening hours and has sometimes trouble to stay awake watching a TV show for example. She usually transfers to the bedroom between 8:30 and 9 PM, she likes to watch TV in the bedroom. She will switch the TV off before falling asleep. The bedroom is otherwise cool, quiet and dark. She shares the bed with her husband. She has to go to the bathroom to urinate 5-6 times each night fragmenting her sleep. She estimates getting less than 5 hours of sleep and an average night. She does recall dreaming at nighttime. No nightmares and not specifically vivid dreams were reported. She rises at 6:50 AM . Usually listens to the news before she gets up, she wakes up spontaneously before the alarm rings. She sleeps on one pillow and prefers sleeping on the side, she does not have a sleep problem. When she wakes up she usually is still in the lateral recumbent position. Her breakfast is usually to go smoothly or yogurt she does not drink coffee or caffeine at it beverages at all. She drinks water and  juice. At work she is without any access to natural daylight, working  in office. 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  Sleep medical history and family sleep history:  He reports 2 isolated incidents of sleepwalking at age 65. No night terrors, there is no diagnosed family member in relation to apnea or any other sleep disorder.  Social history:  Non smoker, non drinker- rarely wine. No shift work history, married, with an 26 year old son.    03-01-16, Melanie Little returns today after her recent split-night  polysomnography. Her study was performed on 11/04/2015 and documented a baseline AHI of 21 the patient did not sleep in supine position but during REM sleep her AHI was exacerbated 55.5. Oxygen nadir was 79 but only 6.5 minutes of desaturation time were noted this was not critical. CPAP was initiated at 4 and very gradually titrated to only 5 cm water pressure which seems to be the best tolerated and reduced the AHI to 0.3. The patient used a nasal mask.  Eson by Caryn Section and [employer].   She did not like a nasal pillow. We are also able today to review her compliance download. Over the last 30 days she has used the machine 100% of the days and 100% over 4 hours of consecutive use is documented. Average user time is 7 hours 51 minutes, set at 6 c meter water pressure with 2 cm EPR - her AHI is 3.2. She reports having quite a bit of air leaks and also discomfort from the nasal  mask. I would like her to try a  Nasal mask. I had the patient try on a witnessed mask by Christ Kick And it seems that in medium to large model since her but also does not have the aggravating fore- head " branch".     Review of Systems: Out of a complete 14 system review, the patient complains of only the following symptoms, and all other reviewed systems are negative.  snoring, sleepiness, fatigue. Nocturia .  Epworth score 9 , Fatigue severity score 45 , depression score n/a    Social History   Social History  . Marital Status: Married    Spouse Name: N/A  . Number of Children: N/A  . Years of Education: N/A   Occupational History  . [employer]    Social History Main Topics  . Smoking status: Never Smoker   . Smokeless tobacco: Never Used  . Alcohol Use: 0.0 oz/week    0 Standard drinks or equivalent per week     Comment: very rare  . Drug Use: No  . Sexual Activity: Yes    Birth Control/ Protection: Other-see comments     Comment: vaginal film, spermicide gel   Other Topics Concern  .  Not on file   Social History Narrative   Work or School: Education officer, 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[employer]      Home Situation: lives with husband      Spiritual Beliefs: Baptist      Lifestyle: walks every other day; diet is bad          Family History  Problem Relation Age of Onset  . CVA Mother 50    deceased, was 54 at the age of her first stroke  . Diabetes Father   . Deep vein thrombosis Father   . Lupus Sister     questionable diagnosis per patient    Past Medical History  Diagnosis Date  . Hypertension   . DUB (dysfunctional uterine bleeding)     around 2012, saw gyn at the time for eval  . Fibroid   . Abnormal Pap smear of cervix about 2013, 2016    Colpo biopsy with normal result per pt.  colpo 2016 HPV changes    Past Surgical History  Procedure Laterality Date  . Cholecystectomy  2004  . Breast reduction surgery  2005  . Incision and drainage Bilateral 2015    Bilateral axillary region ebaceous cyst    Current Outpatient Prescriptions  Medication Sig Dispense Refill  . chlorhexidine (PERIDEX) 0.12 % solution Rinse with 1/2 ounce by mouth for 30 seconds then spit out. use twice a day  0  . SF 5000 PLUS 1.1 % CREA dental cream APPLY A PEA SIZE AMOUNT ONTO BRUSH AND APPLY ON ALL SURFACES OF T...  (REFER TO PRESCRIPTION NOTES).  0  . valsartan (DIOVAN) 160 MG tablet Take 1 tablet (160 mg total) by mouth daily. 90 tablet 3   No current facility-administered medications for this visit.    Allergies as of 03/01/2016  . (No Known Allergies)    Vitals: BP 144/102 mmHg  Pulse 90  Resp 20  Ht 5' 4"  (1.626 m)  Wt 187 lb (84.823 kg)  BMI 32.08 kg/m2 Last Weight:  Wt Readings from Last 1 Encounters:  03/01/16 187 lb (84.823 kg)   UXL:KGMW mass index is 32.08 kg/(m^2).     Last Height:   Ht Readings from Last 1 Encounters:  03/01/16 5' 4"  (1.626 m)    Physical exam:  General: The patient is awake, alert and appears not in acute  distress. The patient is well groomed. Head: Normocephalic, atraumatic. Neck is supple. Mallampati 3,  neck circumference:15.5 . Nasal airflow unrestricted , but she has alergies, TMJ is not  evident . Retrognathia is seen.  Cardiovascular:  Regular rate and rhythm  without  murmurs or carotid bruit, and without distended neck veins. Respiratory: Lungs are clear to auscultation.Skin:  Without evidence of edema, or rash Trunk: BMI is elevated. Neurologic exam :The patient is awake and alert, oriented to place and time.  Memory subjective described as intact.   Attention span & concentration ability appears normal.  Speech is fluent, without dysarthria, dysphonia or aphasia.  Mood and affect are appropriate.  Cranial nerves: Pupils are equal and briskly reactive to light. Funduscopic exam without  evidence of pallor or edema. Extraocular movements  in vertical and horizontal planes intact and without nystagmus. Visual fields by finger perimetry are intact. Hearing to finger rub intact.   Facial sensation intact to fine touch.  Facial motor strength is symmetric and tongue and uvula move midline. Shoulder shrug was symmetrical.   The patient was advised of the nature of the diagnosed sleep disorder , the treatment options and risks for general a health and wellness arising from not treating the condition.  I spent more than 40 minutes of face to face time with the patient. Greater than 50% of time was spent in counseling and coordination of care. We have discussed the diagnosis and differential and I answered the patient's questions.     Assessment:  After physical and neurologic examination, review of laboratory studies,  Personal review of imaging studies, reports of other /same  Imaging studies ,  Results of polysomnography/ neurophysiology testing and pre-existing records as far as provided in visit., my assessment is   1) Melanie Little has  sleep apnea -  and is well treated on low pressure CPAP  with excellent compliance. She is still needing a nasal mask without forehead pressure points- I suggested a WISP .   2)  allergic rhinitis and recently had some postnasal drip and phlegm that she noticed in the morning.  Afrin should not be used more than 5 days in a row and a period of 5 days of use should be alternated with at least 2 days of not using it.  3)  Weight gain, : weight reduction through low carb diet.   4) NCV was normal.    Plan:  Treatment plan and additional workup : CPAP follow up in 12 month, [facility] to refit mask.      Asencion Partridge Atalia Litzinger MD  03/01/2016   CC: Lucretia Kern, Do [address] [place], [place] [id number]

## 2016-06-22 ENCOUNTER — Other Ambulatory Visit: Payer: Self-pay | Admitting: Family Medicine

## 2016-07-09 ENCOUNTER — Telehealth: Payer: Self-pay | Admitting: *Deleted

## 2016-07-09 NOTE — Telephone Encounter (Signed)
Gay Filler I reached out to Dr.Silva in regards to this patients PUS recall. She feels that since this patient was so unhappy about having this done the last time that you should be involve. Please advise  Thanks Kitiara Hintze does need a phone call and letter if she does not follow up.  She was not happy and her last follow up ultrasound because she through she did not need it for her large pelvic mass.  I think that Gay Filler should be involved in the follow up for this patient.   Josefa Half, MD

## 2016-07-11 NOTE — Telephone Encounter (Signed)
Thank you for facilitating the follow up.

## 2016-07-11 NOTE — Telephone Encounter (Signed)
I spoke with patient in regards to her PUS recall. She has agreed to go ahead and have the PUS scheduled. I let her know that we would get it pre cert. and some one would call her back with that info and her appointment date and time. She agreed with this plan.  Sending to Valrie Hart and Dr. Quincy Simmonds for review

## 2016-07-12 ENCOUNTER — Telehealth: Payer: Self-pay | Admitting: *Deleted

## 2016-07-12 NOTE — Telephone Encounter (Signed)
Call to patient. Follow up pelvic ultrasound scheduled for 08-02-16 at 3:30pm with Dr Quincy Simmonds.  Patient is instructed this is vaginal ultrasound and she does not need to fill her bladder.   Routing to provider for final review. Patient agreeable to disposition. Will close encounter.

## 2016-07-31 ENCOUNTER — Telehealth: Payer: Self-pay | Admitting: Obstetrics and Gynecology

## 2016-07-31 NOTE — Telephone Encounter (Signed)
Spoke with patient. Advised patient she may proceed with PUS on 08/02/2016 as long as she feels comfortable. Patient would like to proceed with imaging and will keep her appointment as scheduled. Asking if she will need to reschedule her aex due to being on her menses. Aex is scheduled for 08/03/2016. Advised she will need to notify the office on 08/02/2016 by 3:30 pm if she is still on her menses so that her aex appointment can be rescheduled. She is agreeable.  Routing to provider for final review. Patient agreeable to disposition. Will close encounter.

## 2016-07-31 NOTE — Telephone Encounter (Signed)
Left message to call [place] at [phone].  Patient may keep PUS appointment as scheduled. Okay to have while she is on her cycle.

## 2016-07-31 NOTE — Telephone Encounter (Signed)
Patient started her cycle and is scheduled for an ultrasound on Thursday 08/02/16. Will she need to reschedule?

## 2016-08-01 ENCOUNTER — Telehealth: Payer: Self-pay | Admitting: Obstetrics and Gynecology

## 2016-08-01 NOTE — Telephone Encounter (Signed)
Left message requesting a return call. Upon return call, we will need to reschedule ultrasound.

## 2016-08-02 ENCOUNTER — Other Ambulatory Visit: Payer: 59 | Admitting: Obstetrics and Gynecology

## 2016-08-02 ENCOUNTER — Other Ambulatory Visit: Payer: 59

## 2016-08-03 ENCOUNTER — Ambulatory Visit: Payer: 59 | Admitting: Nurse Practitioner

## 2016-08-06 NOTE — Telephone Encounter (Signed)
Patient need to reschedule upcoming pus appointment 08/09/16.

## 2016-08-07 NOTE — Telephone Encounter (Signed)
Spoke with patient. Patient has been rescheduled for 08/16/16 with Dr Quincy Simmonds. Patient is aware of date, time and cancellation policy. No further questions. Ok to close

## 2016-08-09 ENCOUNTER — Other Ambulatory Visit: Payer: 59

## 2016-08-09 ENCOUNTER — Other Ambulatory Visit: Payer: 59 | Admitting: Obstetrics and Gynecology

## 2016-08-16 ENCOUNTER — Encounter: Payer: Self-pay | Admitting: Obstetrics and Gynecology

## 2016-08-16 ENCOUNTER — Ambulatory Visit (INDEPENDENT_AMBULATORY_CARE_PROVIDER_SITE_OTHER): Payer: 59

## 2016-08-16 ENCOUNTER — Ambulatory Visit: Payer: 59 | Admitting: Obstetrics and Gynecology

## 2016-08-16 VITALS — BP 118/70 | HR 76 | Resp 16 | Ht 63.5 in | Wt 183.0 lb

## 2016-08-16 DIAGNOSIS — N83202 Unspecified ovarian cyst, left side: Secondary | ICD-10-CM

## 2016-08-16 DIAGNOSIS — Z8742 Personal history of other diseases of the female genital tract: Secondary | ICD-10-CM | POA: Diagnosis not present

## 2016-08-16 DIAGNOSIS — D259 Leiomyoma of uterus, unspecified: Secondary | ICD-10-CM | POA: Diagnosis not present

## 2016-08-16 NOTE — Progress Notes (Signed)
Patient ID: Melanie Little, female   DOB: Oct 08, 1971, 45 y.o.   MRN: [id number] GYNECOLOGY  VISIT   HPI: 45 y.o.   Married  [place] American  female   [phone] with Patient's last menstrual period was 08/07/2016.  LMP: 08/07/16 here for pelvic ultrasound for recheck left ovarian cyst.   Also has several uterine fibroids.  Has opted for conservative management.   Denies LLQ pain. During menses has some clotting with pad change 3 x per day.  Cramping during cycle is manageable.   Previous outside ultrasound indicating large left ovarian cyst. Pelvic ultrasound - 05/13/13 - [facility] in [place], [place]: Uterus with 3 fibroids - largest 1.5 cm.  EMS 7 mm.  Left ovarian mass 6.8 x 6.4 x 9.4 cm with low level echoes - hemorrhagic versus endometrioma versus dermoid cyst. Right ovary 3.5 x 2.2 x 3.1 cm not seen separately from the uterus.   CT scan - 05/21/13 - [facility]: Uterus with multi-lobulated masses consistent with fibroids.  Left adnexa with 7.5 cm cystic mass with septation and mildly thickened wall. Right adnexa with 3.3 cm right cyst. Cysts suggestive of hemorrhagic cysts.  Ultrasound in our office 08/18/15: Uterus - 8 fibroids ranging from 0.94 - 1.37 cm EMS - 9.65 mm.  No masses. Ovaries - right ovary - follicles.  Left ovary 62 x 68 mm thick walled cyst with echogenic fluid with hemorrhagic cyst versus other etiology.  No abnormal blood flow. Free fluid - no  CA125 = 31 on 11/10/15.  Endometrial biopsy 09/19/15 - benign proliferative endometrium.  Patient also being followed for ASCUS pap and positive HR HPV - 08/01/15. Colpo 08/18/16 showed HPV effect but no dysplasia.  GYNECOLOGIC HISTORY: Patient's last menstrual period was 08/07/2016. Contraception:  OTC vaginal capsule Menopausal hormone therapy:  none Last mammogram: 10-10-15 Density B/Neg/BiRads1:[facility] Last pap smear:   08-01-15 ASCUS :Pos HR HPV;colpo with ECC neg and  exocervical bx with HPV effect but no dysplasia        OB History    Gravida Para Term Preterm AB Living   4 1 1  0 3 1   SAB TAB Ectopic Multiple Live Births   0 3 0 0 1         Patient Active Problem List   Diagnosis Date Noted  . Fibroid, uterine 08/19/2016  . History of abnormal cervical Pap smear 08/19/2016  . Snoring 10/13/2015  . Hypersomnia with sleep apnea 10/13/2015  . Menorrhagia with regular cycle 09/19/2015  . Left ovarian cyst 09/19/2015  . Essential hypertension 06/08/2015    Past Medical History:  Diagnosis Date  . Abnormal Pap smear of cervix about 2013, 2016   Colpo biopsy with normal result per pt.  colpo 2016 HPV changes  . DUB (dysfunctional uterine bleeding)    around 2012, saw gyn at the time for eval  . Fibroid    multiple fibroids  . Hypertension   . Left ovarian cyst    do yearly ultrasound    Past Surgical History:  Procedure Laterality Date  . BREAST REDUCTION SURGERY  2005  . CHOLECYSTECTOMY  2004  . INCISION AND DRAINAGE Bilateral 2015   Bilateral axillary region ebaceous cyst    Current Outpatient Prescriptions  Medication Sig Dispense Refill  . chlorhexidine (PERIDEX) 0.12 % solution Rinse with 1/2 ounce by mouth for 30 seconds then spit out. use twice a day  0  . SF 5000 PLUS 1.1 % CREA dental cream APPLY  A PEA SIZE AMOUNT ONTO BRUSH AND APPLY ON ALL SURFACES OF T...  (REFER TO PRESCRIPTION NOTES).  0  . valsartan (DIOVAN) 160 MG tablet TAKE 1 TABLET BY MOUTH DAILY 90 tablet 1   No current facility-administered medications for this visit.      ALLERGIES: Review of patient's allergies indicates no known allergies.  Family History  Problem Relation Age of Onset  . CVA Mother 51    deceased, was 73 at the age of her first stroke  . Diabetes Father   . Deep vein thrombosis Father   . Lupus Sister     questionable diagnosis per patient    Social History   Social History  . Marital status: Married    Spouse name: N/A  .  Number of children: N/A  . Years of education: N/A   Occupational History  . [employer]    Social History Main Topics  . Smoking status: Never Smoker  . Smokeless tobacco: Never Used  . Alcohol use 0.0 oz/week     Comment: very rare  . Drug use: No  . Sexual activity: Yes    Birth control/ protection: Other-see comments     Comment: vaginal film, spermicide gel   Other Topics Concern  . Not on file   Social History Narrative   Work or School: Education officer, 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[employer]      Home Situation: lives with husband      Spiritual Beliefs: Baptist      Lifestyle: walks every other day; diet is bad          ROS:  Pertinent items are noted in HPI.  PHYSICAL EXAMINATION:    BP 118/70 (BP Location: Right Arm, Patient Position: Sitting, Cuff Size: Normal)   Pulse 76   Resp 16   Ht 5' 3.5" (1.613 m)   Wt 183 lb (83 kg)   LMP 08/07/2016   BMI 31.91 kg/m     General appearance: alert, cooperative and appears stated age   Pelvic ultrasound Uterus with 10 fibroids, largest 2.4 cm.  Fibroids are intramural and subserosal.  EMS 7.9 mm. Left ovary with thin walled cyst 42 mm (previously 44 mm), thin walled, avascular, echo free and smooth margins.  Right ovary normal.  No free fluid.  ASSESSMENT  Uterine fibroids.  Simple left ovarian cyst.  I suspect a benign cystadenoma.  ASCUS pap and positive HR HPV.  Colpo showing HPV effect.   PLAN  Discussion of fibroids, left ovarian cyst, and abnormal pap.  I recommend yearly pelvic ultrasound to check ovary. Reviewed signs and symptoms of torsion.  Return for annual exam and cotesting - pap/HPV.  An After Visit Summary was printed and given to the patient.  _15_____ minutes face to face time of which over 50% was spent in counseling.

## 2016-08-19 ENCOUNTER — Encounter: Payer: Self-pay | Admitting: Obstetrics and Gynecology

## 2016-08-19 DIAGNOSIS — D259 Leiomyoma of uterus, unspecified: Secondary | ICD-10-CM | POA: Insufficient documentation

## 2016-08-19 DIAGNOSIS — Z8742 Personal history of other diseases of the female genital tract: Secondary | ICD-10-CM | POA: Insufficient documentation

## 2016-08-29 ENCOUNTER — Encounter: Payer: Self-pay | Admitting: Nurse Practitioner

## 2016-08-29 ENCOUNTER — Ambulatory Visit (INDEPENDENT_AMBULATORY_CARE_PROVIDER_SITE_OTHER): Payer: 59 | Admitting: Nurse Practitioner

## 2016-08-29 VITALS — BP 144/92 | HR 72 | Ht 63.75 in | Wt 185.0 lb

## 2016-08-29 DIAGNOSIS — Z01419 Encounter for gynecological examination (general) (routine) without abnormal findings: Secondary | ICD-10-CM

## 2016-08-29 DIAGNOSIS — N760 Acute vaginitis: Secondary | ICD-10-CM | POA: Diagnosis not present

## 2016-08-29 DIAGNOSIS — Z0000 Encounter for general adult medical examination without abnormal findings: Secondary | ICD-10-CM | POA: Diagnosis not present

## 2016-08-29 LAB — POCT URINALYSIS DIPSTICK
Bilirubin, UA: NEGATIVE
Blood, UA: NEGATIVE
GLUCOSE UA: NEGATIVE
KETONES UA: NEGATIVE
Nitrite, UA: NEGATIVE
PROTEIN UA: NEGATIVE
Urobilinogen, UA: NEGATIVE
pH, UA: 5

## 2016-08-29 NOTE — Progress Notes (Signed)
Patient ID: Melanie Little, female   DOB: April 04, 1971, 45 y.o.   MRN: [id number]  45 y.o. [id number] Married  African American Fe here for annual exam.  Last PUS 08/16/16 showed no real change in OV cyst and she does have 8 fibroids.  Her CA 125 was 31.  She was advised that she should have PUS yearly to follow the fibroids and cyst.  EMB 9/16 showed proliferative endo - benign.  Her cycle are regular with moderate flow.  She is not ready to decide on surgical options.  Patient's last menstrual period was 08/07/2016.          Sexually active: Yes.    The current method of family planning is vaginal film and spermicide gel.    Exercising: No.  The patient does not participate in regular exercise at present. Smoker:  no  Health Maintenance: Pap: 08/01/15, ASCUS with pos HR HPV; Colpo 08/18/16, CIN I  MMG: 10/10/15, Bi-Rads 1:  Negative TDaP: 12/31/09 per patient HIV: 08/01/15 Labs: PCP  Urine: Trace Leuk's   reports that she has never smoked. She has never used smokeless tobacco. She reports that she drinks alcohol. She reports that she does not use drugs.  Past Medical History:  Diagnosis Date  . Abnormal Pap smear of cervix about 2013, 2016   Colpo biopsy with normal result per pt.  colpo 2016 HPV changes  . DUB (dysfunctional uterine bleeding)    around 2012, saw gyn at the time for eval  . Fibroid    multiple fibroids  . Hypertension   . Left ovarian cyst    do yearly ultrasound    Past Surgical History:  Procedure Laterality Date  . BREAST REDUCTION SURGERY  2005  . CHOLECYSTECTOMY  2004  . INCISION AND DRAINAGE Bilateral 2015   Bilateral axillary region ebaceous cyst    Current Outpatient Prescriptions  Medication Sig Dispense Refill  . chlorhexidine (PERIDEX) 0.12 % solution Rinse with 1/2 ounce by mouth for 30 seconds then spit out. use twice a day  0  . SF 5000 PLUS 1.1 % CREA dental cream APPLY A PEA SIZE AMOUNT ONTO BRUSH AND APPLY ON ALL SURFACES OF T...  (REFER TO PRESCRIPTION  NOTES).  0  . valsartan (DIOVAN) 160 MG tablet TAKE 1 TABLET BY MOUTH DAILY 90 tablet 1   No current facility-administered medications for this visit.     Family History  Problem Relation Age of Onset  . CVA Mother 32    deceased, was 85 at the age of her first stroke  . Diabetes Father   . Deep vein thrombosis Father   . Lupus Sister     questionable diagnosis per patient    ROS:  Pertinent items are noted in HPI.  Otherwise, a comprehensive ROS was negative.  Exam:   LMP 08/07/2016    Ht Readings from Last 3 Encounters:  08/16/16 5' 3.5" (1.613 m)  03/01/16 5' 4"  (1.626 m)  11/10/15 5' 3.5" (1.613 m)    General appearance: alert, cooperative and appears stated age Head: Normocephalic, without obvious abnormality, atraumatic Neck: no adenopathy, supple, symmetrical, trachea midline and thyroid normal to inspection and palpation Lungs: clear to auscultation bilaterally Breasts: normal appearance, no masses or tenderness, bilateral breast reduction scars with keloids that are worse both outer quadrants. Heart: regular rate and rhythm Abdomen: soft, non-tender; no masses,  no organomegaly Extremities: extremities normal, atraumatic, no cyanosis or edema Skin: Skin color, texture, turgor normal. No rashes or lesions Lymph  nodes: Cervical, supraclavicular, and axillary nodes normal. No abnormal inguinal nodes palpated Neurologic: Grossly normal   Pelvic: External genitalia:  no lesions              Urethra:  normal appearing urethra with no masses, tenderness or lesions              Bartholin's and Skene's: normal                 Vagina: normal appearing vagina with normal color and Hasten discharge, no lesions              Cervix: anteverted              Pap taken: Yes.   Bimanual Exam:  Uterus:  enlarged, 12-14 weeks size              Adnexa: positive for: enlargement               Rectovaginal: Confirms               Anus:  normal sphincter tone, no lesions  Chaperone  present: yes  A:  Well Woman with normal exam              History of uterine fibroid X 8 and OV cyst 6.8 X 6.4 X 9.4 cm - needs yearly PUS              Menorrhagia              Birth control - vaginal gel              History of  Migraine, HTN, BMI 32.              History of abnormal pap with colpo biopsy 2013, colpo 2016 for ASCUS + HR HPV with HPV changes and CIN I on biopsy              S/P breast reduction 2015 with large keloid scars               R/O UTI / vaginitis   - most likely from vaginitis     P:   Reviewed health and wellness pertinent to exam  Pap smear as above  Mammogram is due 09/2016  Follow up with pap and labs.  Encouraged yearly PUS  She will call if pelvic pain or increase I menorrhagia  Counseled on breast self exam, mammography screening, adequate intake of calcium and vitamin D, diet and exercise return annually or prn  An After Visit Summary was printed and given to the patient.

## 2016-08-29 NOTE — Patient Instructions (Signed)

## 2016-08-30 ENCOUNTER — Other Ambulatory Visit: Payer: Self-pay | Admitting: Nurse Practitioner

## 2016-08-30 LAB — WET PREP BY MOLECULAR PROBE
Candida species: POSITIVE — AB
GARDNERELLA VAGINALIS: POSITIVE — AB
Trichomonas vaginosis: NEGATIVE

## 2016-08-30 MED ORDER — FLUCONAZOLE 150 MG PO TABS
150.0000 mg | ORAL_TABLET | Freq: Once | ORAL | 0 refills | Status: AC
Start: 2016-08-30 — End: 2016-08-30

## 2016-08-30 MED ORDER — METRONIDAZOLE 0.75 % VA GEL: 1.0000 | Freq: Every day | VAGINAL | 0 refills | Status: DC

## 2016-08-30 NOTE — Progress Notes (Signed)
Reviewed personally.  M. Suzanne Berley Gambrell, MD.  

## 2016-08-31 LAB — URINE CULTURE: ORGANISM ID, BACTERIA: NO GROWTH

## 2016-09-04 LAB — IPS PAP TEST WITH HPV

## 2016-11-26 ENCOUNTER — Other Ambulatory Visit: Payer: Self-pay | Admitting: Family Medicine

## 2016-11-26 DIAGNOSIS — Z1231 Encounter for screening mammogram for malignant neoplasm of breast: Secondary | ICD-10-CM

## 2016-12-02 NOTE — Progress Notes (Signed)
HPI:  Here for CPE: Sees gyn for women's health exams, paps, breast health and to monitor her hx of abnormal pap, ovarian cysts and fibroids. Hx of hyperglycemia, obesity and hypertension and due for labs to recheck BP labs, hgba1c and lipids. Reports small lump L upper buttock for 1 year, unchanged, not painful. Reports feels R eye looks different then L for 1 month. No vision changes, ha, drainage.  -Diet: so, so, likes cheese and pasta -Exercise: no regular exercise  -Taking folic acid, vitamin D or calcium: no  -Diabetes and Dyslipidemia Screening:fasting for labs  -Vaccines: due for flu vaccine - refused  -sexual activity: yes, female partner, no new partners  -wants STI testing (Hep C if born [birth year]65): no  -FH breast, colon or ovarian ca: see FH Last mammogram: sees gyn for women's health - dong mammogram today Last colon cancer screening:  n/a  -Alcohol, Tobacco, drug use: see social history  Review of Systems - no fevers, unintentional weight loss, vision loss, hearing loss, chest pain, sob, hemoptysis, melena, hematochezia, hematuria, genital discharge, changing or concerning skin lesions, bleeding, bruising, loc, thoughts of self harm or SI  Past Medical History:  Diagnosis Date  . Abnormal Pap smear of cervix about 2013, 2016   Colpo biopsy with normal result per pt.  colpo 2016 HPV changes  . DUB (dysfunctional uterine bleeding)    around 2012, saw gyn at the time for eval  . Fibroid    multiple fibroids  . Hypertension   . Left ovarian cyst    do yearly ultrasound    Past Surgical History:  Procedure Laterality Date  . BREAST REDUCTION SURGERY  2005  . CHOLECYSTECTOMY  2004  . INCISION AND DRAINAGE Bilateral 2015   Bilateral axillary region ebaceous cyst    Family History  Problem Relation Age of Onset  . CVA Mother 37    deceased, was 59 at the age of her first stroke  . Diabetes Father   . Deep vein thrombosis Father   . Lupus Sister    questionable diagnosis per patient  . Breast cancer Maternal Aunt   . Breast cancer Maternal Aunt     Social History   Social History  . Marital status: Married    Spouse name: N/A  . Number of children: N/A  . Years of education: N/A   Occupational History  . [employer]    Social History Main Topics  . Smoking status: Never Smoker  . Smokeless tobacco: Never Used  . Alcohol use 0.0 oz/week     Comment: very rare  . Drug use: No  . Sexual activity: Yes    Birth control/ protection: Other-see comments     Comment: vaginal film, spermicide gel   Other Topics Concern  . 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  chlorhexidine (PERIDEX) 0.12 % solution, Rinse with 1/2 ounce by mouth for 30 seconds then spit out. use twice a day, Disp: , Rfl: 0 .  SF 5000 PLUS 1.1 % CREA dental cream, APPLY A PEA SIZE AMOUNT ONTO BRUSH AND APPLY ON ALL SURFACES OF T...  (REFER TO PRESCRIPTION NOTES)., Disp: ,  Rfl: 0 .  valsartan (DIOVAN) 160 MG tablet, TAKE 1 TABLET BY MOUTH DAILY, Disp: 90 tablet, Rfl: 1  EXAM:  Vitals:   12/03/16 1148  BP: 128/88  Pulse: 76  Temp: 98.3 F (36.8 C)   Body mass index is 31.65 kg/m.  GENERAL: vitals reviewed and listed below, alert, oriented, appears well hydrated and in no acute distress  HEENT: head atraumatic, PERRLA, EOMI, ? mild R > L exophthalmus, visual acuity grossly intact, normal appearance of eyes, ears, nose and mouth. moist mucus membranes.  NECK: supple, no masses or lymphadenopathy  LUNGS: clear to auscultation bilaterally, no rales, rhonchi or wheeze  CV: HRRR, no peripheral edema or cyanosis, normal pedal pulses  ABDOMEN: bowel sounds normal, soft, non tender to  palpation, no masses, no rebound or guarding  GU: declined, does with gyn  BREAST: declined, does with gyn  SKIN: no rash or abnormal lesions, small pea sized, rubbery sub cut nodule L upper buttock with overlying pore  MS: normal gait, moves all extremities normally  NEURO: normal gait, speech and thought processing grossly intact, muscle tone grossly intact throughout  PSYCH: normal affect, pleasant and cooperative  ASSESSMENT AND PLAN:  Discussed the following assessment and plan:  Visit for preventive health examination  Essential hypertension - Plan: CBC (no diff), Basic metabolic panel  BMI 29.5-18.8,CZYSA - Plan: Lipid Panel, Hemoglobin A1c  Subcutaneous nodule - -L upper buttock  Exophthalmus - Plan: TSH  -thyroid check and advised opthomology exam for her eye concern, she plans to call to schedule optho appointment  -sub cut lesion likely cyst, advise if desires more def dx would need biopsy - she sees a dermatologist and may consider  -Discussed and advised all [place] preventive services health task force level A and B recommendations for age, sex and risks.  -Advised at least 150 minutes of exercise per week and a healthy diet with avoidance of (less then 1 serving per week) processed foods, [place] starches, red meat, fast foods and sweets and consisting of: * 5-9 servings of fresh fruits and vegetables (not corn or potatoes) *nuts and seeds, beans *olives and olive oil *lean meats such as fish and [place] chicken  *whole grains  -labs, studies and vaccines per orders this encounter  Orders Placed This Encounter  Procedures  . CBC (no diff)  . Basic metabolic panel  . Lipid Panel  . Hemoglobin A1c  . TSH    Patient advised to return to clinic immediately if symptoms worsen or persist or new concerns.  Patient Instructions  BEFORE YOU LEAVE: -labs -follow up: 4 months  Vit D3 [phone] IU daily  We have ordered labs or studies at this visit. It can take  up to 1-2 weeks for results and processing. IF results require follow up or explanation, we will call you with instructions. Clinically stable results will be released to your [facility]. If you have not heard from [place] or cannot find your results in [facility] in 2 weeks please contact our office at [phone].  If you are not yet signed up for [facility], please consider signing up.   We recommend the following healthy lifestyle for LIFE: 1) Small portions.   Tip: eat off of a salad plate instead of a dinner plate.  Tip: if you need more or a snack choose fruits, veggies and/or a handful of nuts or seeds.  2) Eat a healthy clean diet.  * Tip: Avoid (less then 1 serving per week): processed foods, sweets, sweetened  drinks, [place] starches (rice, flour, bread, potatoes, pasta, etc), red meat, fast foods, butter  *Tip: CHOOSE instead   * 5-9 servings per day of fresh or frozen fruits and vegetables (but not corn, potatoes, bananas, canned or dried fruit)   *nuts and seeds, beans   *olives and olive oil   *small portions of lean meats such as fish and [place] chicken    *small portions of whole grains  3)Get at least 150 minutes of sweaty aerobic exercise per week.  4)Reduce stress - consider counseling, meditation and relaxation to balance other aspects of your life.            No Follow-up on file.  Colin Benton R., DO

## 2016-12-03 ENCOUNTER — Ambulatory Visit
Admission: RE | Admit: 2016-12-03 | Discharge: 2016-12-03 | Disposition: A | Discharge status: Home / Self Care | Payer: 59 | Source: Ambulatory Visit | Attending: Family Medicine | Admitting: Family Medicine

## 2016-12-03 ENCOUNTER — Encounter: Payer: Self-pay | Admitting: Family Medicine

## 2016-12-03 ENCOUNTER — Ambulatory Visit (INDEPENDENT_AMBULATORY_CARE_PROVIDER_SITE_OTHER): Payer: 59 | Admitting: Family Medicine

## 2016-12-03 VITALS — BP 128/88 | HR 76 | Temp 98.3°F | Ht 65.0 in | Wt 190.2 lb

## 2016-12-03 DIAGNOSIS — Z0000 Encounter for general adult medical examination without abnormal findings: Secondary | ICD-10-CM

## 2016-12-03 DIAGNOSIS — I10 Essential (primary) hypertension: Secondary | ICD-10-CM

## 2016-12-03 DIAGNOSIS — Z6831 Body mass index (BMI) 31.0-31.9, adult: Secondary | ICD-10-CM | POA: Diagnosis not present

## 2016-12-03 DIAGNOSIS — Z1231 Encounter for screening mammogram for malignant neoplasm of breast: Secondary | ICD-10-CM

## 2016-12-03 DIAGNOSIS — R229 Localized swelling, mass and lump, unspecified: Secondary | ICD-10-CM

## 2016-12-03 DIAGNOSIS — H0520 Unspecified exophthalmos: Secondary | ICD-10-CM

## 2016-12-03 DIAGNOSIS — R899 Unspecified abnormal finding in specimens from other organs, systems and tissues: Secondary | ICD-10-CM

## 2016-12-03 LAB — CBC
HEMATOCRIT: 45.7 % (ref 36.0–46.0)
HEMOGLOBIN: 14.5 g/dL (ref 12.0–15.0)
MCHC: 31.8 g/dL (ref 30.0–36.0)
MCV: 70 fl — AB (ref 78.0–100.0)
PLATELETS: 417 10*3/uL — AB (ref 150.0–400.0)
RBC: 6.53 Mil/uL — AB (ref 3.87–5.11)
RDW: 14.6 % (ref 11.5–15.5)
WBC: 9 10*3/uL (ref 4.0–10.5)

## 2016-12-03 LAB — LIPID PANEL
Cholesterol: 198 mg/dL (ref 0–200)
HDL: 59.3 mg/dL (ref >39.00)
LDL CALC: 109 mg/dL — AB (ref 0–99)
NONHDL: 139.15
Total CHOL/HDL Ratio: 3
Triglycerides: 152 mg/dL — ABNORMAL HIGH (ref 0.0–149.0)
VLDL: 30.4 mg/dL (ref 0.0–40.0)

## 2016-12-03 LAB — BASIC METABOLIC PANEL
BUN: 9 mg/dL (ref 6–23)
CO2: 26 mEq/L (ref 19–32)
CREATININE: 0.69 mg/dL (ref 0.40–1.20)
Calcium: 9.3 mg/dL (ref 8.4–10.5)
Chloride: 103 mEq/L (ref 96–112)
GFR: 119.12 mL/min (ref >60.00)
GLUCOSE: 78 mg/dL (ref 70–99)
POTASSIUM: 3.3 meq/L — AB (ref 3.5–5.1)
Sodium: 139 mEq/L (ref 135–145)

## 2016-12-03 LAB — HEMOGLOBIN A1C: HEMOGLOBIN A1C: 6.2 % (ref 4.6–6.5)

## 2016-12-03 LAB — TSH: TSH: 2.09 u[IU]/mL (ref 0.35–4.50)

## 2016-12-03 NOTE — Progress Notes (Signed)
Pre visit review using our clinic review tool, if applicable. No additional management support is needed unless otherwise documented below in the visit note. 

## 2016-12-03 NOTE — Patient Instructions (Signed)
BEFORE YOU LEAVE: -labs -follow up: 4 months  Vit D3 [phone] IU daily  We have ordered labs or studies at this visit. It can take up to 1-2 weeks for results and processing. IF results require follow up or explanation, we will call you with instructions. Clinically stable results will be released to your [facility]. If you have not heard from [place] or cannot find your results in [facility] in 2 weeks please contact our office at [phone].  If you are not yet signed up for [facility], please consider signing up.   We recommend the following healthy lifestyle for LIFE: 1) Small portions.   Tip: eat off of a salad plate instead of a dinner plate.  Tip: if you need more or a snack choose fruits, veggies and/or a handful of nuts or seeds.  2) Eat a healthy clean diet.  * Tip: Avoid (less then 1 serving per week): processed foods, sweets, sweetened drinks, [place] starches (rice, flour, bread, potatoes, pasta, etc), red meat, fast foods, butter  *Tip: CHOOSE instead   * 5-9 servings per day of fresh or frozen fruits and vegetables (but not corn, potatoes, bananas, canned or dried fruit)   *nuts and seeds, beans   *olives and olive oil   *small portions of lean meats such as fish and [place] chicken    *small portions of whole grains  3)Get at least 150 minutes of sweaty aerobic exercise per week.  4)Reduce stress - consider counseling, meditation and relaxation to balance other aspects of your life.

## 2016-12-05 ENCOUNTER — Other Ambulatory Visit: Payer: Self-pay | Admitting: Ophthalmology

## 2016-12-05 DIAGNOSIS — H0520 Unspecified exophthalmos: Secondary | ICD-10-CM

## 2016-12-06 ENCOUNTER — Encounter: Payer: Self-pay | Admitting: Family Medicine

## 2016-12-06 NOTE — Addendum Note (Signed)
Addended by: Agnes Lawrence on: 12/06/2016 04:22 PM   Modules accepted: Orders

## 2017-01-07 DIAGNOSIS — T148XXA Other injury of unspecified body region, initial encounter: Secondary | ICD-10-CM | POA: Diagnosis not present

## 2017-01-07 DIAGNOSIS — L720 Epidermal cyst: Secondary | ICD-10-CM | POA: Diagnosis not present

## 2017-02-01 ENCOUNTER — Other Ambulatory Visit: Payer: Self-pay | Admitting: Family Medicine

## 2017-03-04 ENCOUNTER — Ambulatory Visit (INDEPENDENT_AMBULATORY_CARE_PROVIDER_SITE_OTHER): Payer: 59 | Admitting: Neurology

## 2017-03-04 ENCOUNTER — Encounter: Payer: Self-pay | Admitting: Neurology

## 2017-03-04 VITALS — BP 152/83 | HR 83 | Resp 20 | Ht 64.0 in | Wt 192.0 lb

## 2017-03-04 DIAGNOSIS — Z9989 Dependence on other enabling machines and devices: Secondary | ICD-10-CM

## 2017-03-04 DIAGNOSIS — G4733 Obstructive sleep apnea (adult) (pediatric): Secondary | ICD-10-CM | POA: Diagnosis not present

## 2017-03-04 NOTE — Patient Instructions (Signed)
Obesity, Adult Obesity is having too much body fat. If you have a BMI of 30 or more, you are obese. BMI is a number that explains how much body fat you have. Obesity is often caused by taking in (consuming) more calories than your body uses. Obesity can cause serious health problems. Changing your lifestyle can help to treat obesity. Follow these instructions at home: Eating and drinking    Follow advice from your doctor about what to eat and drink. Your doctor may tell you to:  Cut down on (limit) fast foods, sweets, and processed snack foods.  Choose low-fat options. For example, choose low-fat milk instead of whole milk.  Eat 5 or more servings of fruits or vegetables every day.  Eat at home more often. This gives you more control over what you eat.  Choose healthy foods when you eat out.  Learn what a healthy portion size is. A portion size is the amount of a certain food that is healthy for you to eat at one time. This is different for each person.  Keep low-fat snacks available.  Avoid sugary drinks. These include soda, fruit juice, iced tea that is sweetened with sugar, and flavored milk.  Eat a healthy breakfast.  Drink enough water to keep your pee (urine) clear or pale yellow.  Do not go without eating for long periods of time (do not fast).  Do not go on popular or trendy diets (fad diets). Physical Activity   Exercise often, as told by your doctor. Ask your doctor:  What types of exercise are safe for you.  How often you should exercise.  Warm up and stretch before being active.  Do slow stretching after being active (cool down).  Rest between times of being active. Lifestyle   Limit how much time you spend in front of your TV, computer, or video game system (be less sedentary).  Find ways to reward yourself that do not involve food.  Limit alcohol intake to no more than 1 drink a day for nonpregnant women and 2 drinks a day for men. One drink equals 12  oz of beer, 5 oz of wine, or 1 oz of hard liquor. General instructions   Keep a weight loss journal. This can help you keep track of:  The food that you eat.  The exercise that you do.  Take over-the-counter and prescription medicines only as told by your doctor.  Take vitamins and supplements only as told by your doctor.  Think about joining a support group. Your doctor may be able to help with this.  Keep all follow-up visits as told by your doctor. This is important. Contact a doctor if:  You cannot meet your weight loss goal after you have changed your diet and lifestyle for 6 weeks. This information is not intended to replace advice given to you by your health care provider. Make sure you discuss any questions you have with your health care provider. Document Released: 03/10/2012 Document Revised: 05/24/2016 Document Reviewed: 10/05/2015 Elsevier Interactive Patient Education  2017 [employer]. CPAP and BiPAP Information CPAP and BiPAP are methods of helping a person breathe with the use of air pressure. CPAP stands for "continuous positive airway pressure." BiPAP stands for "bi-level positive airway pressure." In both methods, air is blown through your nose or mouth and into your air passages to help you breathe well. CPAP and BiPAP use different amounts of pressure to blow air. With CPAP, the amount of pressure stays the same  while you breathe in and out. With BiPAP, the amount of pressure is increased when you breathe in (inhale) so that you can take larger breaths. Your health care provider will recommend whether CPAP or BiPAP would be more helpful for you. Why are CPAP and BiPAP treatments used? CPAP or BiPAP can be helpful if you have:  Sleep apnea.  Chronic obstructive pulmonary disease (COPD).  Heart failure.  Medical conditions that weaken the muscles of the chest including muscular dystrophy, or neurological diseases such as amyotrophic lateral sclerosis  (ALS).  Other problems that cause breathing to be weak, abnormal, or difficult. CPAP is most commonly used for obstructive sleep apnea (OSA) to keep the airways from collapsing when the muscles relax during sleep. How is CPAP or BiPAP administered? Both CPAP and BiPAP are provided by a small machine with a flexible plastic tube that attaches to a plastic mask. You wear the mask. Air is blown through the mask into your nose or mouth. The amount of pressure that is used to blow the air can be adjusted on the machine. Your health care provider will determine the pressure setting that should be used based on your individual needs. When should CPAP or BiPAP be used? In most cases, the mask only needs to be worn during sleep. Generally, the mask needs to be worn throughout the night and during any daytime naps. People with certain medical conditions may also need to wear the mask at other times when they are awake. Follow instructions from your health care provider about when to use the machine. What are some tips for using the mask?  Because the mask needs to be snug, some people feel trapped or closed-in (claustrophobic) when first using the mask. If you feel this way, you may need to get used to the mask. One way to do this is by holding the mask loosely over your nose or mouth and then gradually applying the mask more snugly. You can also gradually increase the amount of time that you use the mask.  Masks are available in various types and sizes. Some fit over your mouth and nose while others fit over just your nose. If your mask does not fit well, talk with your health care provider about getting a different one.  If you are using a mask that fits over your nose and you tend to breathe through your mouth, a chin strap may be applied to help keep your mouth closed.  The CPAP and BiPAP machines have alarms that may sound if the mask comes off or develops a leak.  If you have trouble with the mask, it  is very important that you talk with your health care provider about finding a way to make the mask easier to tolerate. Do not stop using the mask. Stopping the use of the mask could have a negative impact on your health. What are some tips for using the machine?  Place your CPAP or BiPAP machine on a secure table or stand near an electrical outlet.  Know where the on/off switch is located on the machine.  Follow instructions from your health care provider about how to set the pressure on your machine and when you should use it.  Do not eat or drink while the CPAP or BiPAP machine is on. Food or fluids could get pushed into your lungs by the pressure of the CPAP or BiPAP.  Do not smoke. Tobacco smoke residue can damage the machine.  For home use,  CPAP and BiPAP machines can be rented or purchased through home health care companies. Many different brands of machines are available. Renting a machine before purchasing may help you find out which particular machine works well for you.  Keep the CPAP or BiPAP machine and attachments clean. Ask your health care provider for specific instructions. Get help right away if:  You have redness or open areas around your nose or mouth where the mask fits.  You have trouble using the CPAP or BiPAP machine.  You cannot tolerate wearing the CPAP or BiPAP mask.  You have pain, discomfort, and bloating in your abdomen. Summary  CPAP and BiPAP are methods of helping a person breathe with the use of air pressure.  Both CPAP and BiPAP are provided by a small machine with a flexible plastic tube that attaches to a plastic mask.  If you have trouble with the mask, it is very important that you talk with your health care provider about finding a way to make the mask easier to tolerate. This information is not intended to replace advice given to you by your health care provider. Make sure you discuss any questions you have with your health care  provider. Document Released: 09/14/2004 Document Revised: 11/05/2016 Document Reviewed: 11/05/2016 Elsevier Interactive Patient Education  2017 [employer].

## 2017-03-04 NOTE — Progress Notes (Signed)
SLEEP MEDICINE CLINIC   Provider:  Larey Seat, M D  Referring Provider: Lucretia Kern, DO Primary Care Physician:  Lucretia Kern., DO  Chief Complaint  Patient presents with  . Follow-up    cpap going well, dry throat    HPI:  Melanie Little is a 46 y.o. female , seen here as a referral from Colin Benton, DO  for a sleep evaluation,    Melanie Little is  a married African-American and left-handed female, presents today with a concern of excessive daytime fatigue and associated poor and nonrestorative sleep. She is especially concerned because her ability to focus seems to be impaired and sometimes she appears more forgetful than she could attribute to age or any other medical factors. She endorsed today the fatigue severity scale at 43 points and the Epworth sleepiness score at 9 points her husband has told her that she snores. Her husband now also has noted her to have apneas. He witnessed some. She feels that she doesn't get enough sleep and not restorative sleep in general. Risk factors are recent weight gain more than 20 pounds over the last 24 month. When she traveled with her sister last year, the to women share the bedroom. Her snoring was recorded by her roommate. Her cardiologist also advised her that her blood pressure was not responding to several drugs as well that this may be related to an underlying condition of obstructive sleep apnea. She spoke to her primary care physician, . Blima Singer, DO  to be referred here to [facility] since her husband is also a patient here. 3 years ago , while the patient lived in [place], her primary care physician Dr. Teryl Lucy ordered a home sleep test she brought me a copy of the results. She was actually not diagnosed with significant apnea in Summer 2013 her AHI was 3 and her RDI was 9. He listed the following diagnosis in her history; excessive daytime sleepiness, essential hypertension on more than 2 drugs, she does not  have a history of congestive heart disease, diabetes or cardiovascular disease.  Sleep habits are as follows: The patient onset my question about her preferred bedtime as "anytime I can".  She returns from work between 5:30 and 6 PM and often likes to take a nap to get refreshed. These naps last 1 hour. She does not recall any dreams during naps. She fights dozing off in the evening hours and has sometimes trouble to stay awake watching a TV show for example. She usually transfers to the bedroom between 8:30 and 9 PM, she likes to watch TV in the bedroom. She will switch the TV off before falling asleep. The bedroom is otherwise cool, quiet and dark. She shares the bed with her husband. She has to go to the bathroom to urinate 5-6 times each night fragmenting her sleep. She estimates getting less than 5 hours of sleep and an average night. She does recall dreaming at nighttime. No nightmares and not specifically vivid dreams were reported. She rises at 6:50 AM . Usually listens to the news before she gets up, she wakes up spontaneously before the alarm rings. She sleeps on one pillow and prefers sleeping on the side, she does not have a sleep problem. When she wakes up she usually is still in the lateral recumbent position. Her breakfast is usually to go smoothly or yogurt she does not drink coffee or caffeine at it beverages at all. She drinks water and juice.  At work she is without any access to natural daylight, working  in office. 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  Sleep medical history and family sleep history:  He reports 2 isolated incidents of sleepwalking at age 22. No night terrors, there is no diagnosed family member in relation to apnea or any other sleep disorder.  Social history:  Non smoker, non drinker- rarely wine. No shift work history, married, with an 77 year old son.    03-01-16, Melanie Little returns today after her recent split-night  polysomnography. Her study was performed on 11/04/2015 and documented a baseline AHI of 21 the patient did not sleep in supine position but during REM sleep her AHI was exacerbated 55.5. Oxygen nadir was 79 but only 6.5 minutes of desaturation time were noted this was not critical. CPAP was initiated at 4 and very gradually titrated to only 5 cm water pressure which seems to be the best tolerated and reduced the AHI to 0.3. The patient used a nasal mask.  Eson by Melanie Little and Melanie Little.   She did not like a nasal pillow. We are also able today to review her compliance download. Over the last 30 days she has used the machine 100% of the days and 100% over 4 hours of consecutive use is documented. Average user time is 7 hours 51 minutes, set at 6 c meter water pressure with 2 cm EPR - her AHI is 3.2. She reports having quite a bit of air leaks and also discomfort from the nasal  mask. I would like her to try a  Nasal mask.  Interval history on 03/04/2017, Melanie Little is here today with an excellent compliance report of 97%, fatigue severity reduced to 15 points from previously 25, Epworth sleepiness score is endorsed at 0 points. Continued air leaks, but mask is more comfortable. Snores when mask dislodges, dry mouth.  Explained how to set humidifier.    Review of Systems: Out of a complete 14 system review, the patient complains of only the following symptoms, and all other reviewed systems are negative.  snoring, Exercise intolerant, sleepy .   Epworth 0 , Fatigue severity score 15 , depression score n/a    Social History   Social History  . Marital status: Married    Spouse name: N/A  . Number of children: N/A  . Years of education: N/A   Occupational History  . [employer]    Social History Main Topics  . Smoking status: Never Smoker  . Smokeless tobacco: Never Used  . Alcohol use 0.0 oz/week     Comment: very rare  . Drug use: No  . Sexual activity: Yes    Birth  control/ protection: Other-see comments     Comment: vaginal film, spermicide gel   Other Topics Concern  . Not on file   Social History Narrative   Work or School: Education officer, 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[employer]      Home Situation: lives with husband      Spiritual Beliefs: Baptist      Lifestyle: walks every other day; diet is bad          Family History  Problem Relation Age of Onset  . CVA Mother 3    deceased, was 45 at the age of her first stroke  . Diabetes Father   . Deep vein thrombosis Father   . Lupus Sister     questionable diagnosis per  patient  . Breast cancer Maternal Aunt   . Breast cancer Maternal Aunt     Past Medical History:  Diagnosis Date  . Abnormal Pap smear of cervix about 2013, 2016   Colpo biopsy with normal result per pt.  colpo 2016 HPV changes  . DUB (dysfunctional uterine bleeding)    around 2012, saw gyn at the time for eval  . Fibroid    multiple fibroids  . Hypertension   . Left ovarian cyst    do yearly ultrasound    Past Surgical History:  Procedure Laterality Date  . BREAST REDUCTION SURGERY  2005  . CHOLECYSTECTOMY  2004  . INCISION AND DRAINAGE Bilateral 2015   Bilateral axillary region ebaceous cyst    Current Outpatient Prescriptions  Medication Sig Dispense Refill  . chlorhexidine (PERIDEX) 0.12 % solution Rinse with 1/2 ounce by mouth for 30 seconds then spit out. use twice a day  0  . SF 5000 PLUS 1.1 % CREA dental cream APPLY A PEA SIZE AMOUNT ONTO BRUSH AND APPLY ON ALL SURFACES OF T...  (REFER TO PRESCRIPTION NOTES).  0  . valsartan (DIOVAN) 160 MG tablet TAKE 1 TABLET BY MOUTH DAILY 90 tablet 2   No current facility-administered medications for this visit.     Allergies as of 03/04/2017  . (No Known Allergies)    Vitals: BP (!) 152/83   Pulse 83   Resp 20   Ht 5' 4"  (1.626 m)   Wt 192 lb (87.1 kg)   BMI 32.96 kg/m  Last Weight:  Wt Readings from Last 1 Encounters:  03/04/17 192 lb  (87.1 kg)   EXN:TZGY mass index is 32.96 kg/m.     Last Height:   Ht Readings from Last 1 Encounters:  03/04/17 5' 4"  (1.626 m)    Physical exam:  General: The patient is awake, alert and appears not in acute distress. The patient is well groomed. Head: Normocephalic, atraumatic. Neck is supple. Mallampati 3,  neck circumference:15.5 . Nasal airflow unrestricted , but she has alergies, TMJ is not  evident . Retrognathia is seen.  BMI 33.  Cardiovascular:  Regular rate and rhythm  without  murmurs or carotid bruit, and without distended neck veins. Respiratory: Lungs are clear to auscultation.Skin:  Without evidence of edema, or rash Trunk: BMI is elevated. Neurologic exam :  Cranial nerves: Pupils are equal and briskly reactive to light. no nystagmus. Visual fields by finger perimetry are intact. Hearing to finger rub intact. Facial sensation intact to fine touch.Today's CPAP download revealed excellent compliance at 97% for 7 hours and 21 minutes average user time, CPAP is set at only 6 cm water pressure with 2 cm EPR, the residual apnea index is 3.0 the AHI 4.1 some air leaks still persist. The patient is using a whisp interface, couldn't use the dream wear.  The patient was advised of the nature of the diagnosed sleep disorder , the treatment options and risks for general a health and wellness arising from not treating the condition.  I spent more than 15 minutes of face to face time with the patient. Greater than 50% of time was spent in counseling and coordination of care. We have discussed the diagnosis and differential and I answered the patient's questions.     Assessment:  After physical and neurologic examination, review of laboratory studies,  Personal review of imaging studies, reports of other /same  Imaging studies ,  Results of polysomnography/ neurophysiology testing and pre-existing records as  far as provided in visit., my assessment is   1) Melanie Little has obstructive   sleep apnea - and is well treated on low pressure CPAP with excellent compliance. She is still using the recommended  WISP interface  .   Melanie Little reports that she uses nearly all the water in the water reservoir and feels that her Airway is dry. The adjusted together today the humidifier setting from 2 to a level IV.        Plan:  Treatment plan and additional workup : CPAP follow up in 12 month, [facility] to follow .     Asencion Partridge Loralie Malta MD  03/04/2017   CC: Lucretia Kern, Do [address] [place], [place] [address]

## 2017-03-07 ENCOUNTER — Other Ambulatory Visit (INDEPENDENT_AMBULATORY_CARE_PROVIDER_SITE_OTHER): Payer: 59

## 2017-03-07 DIAGNOSIS — R899 Unspecified abnormal finding in specimens from other organs, systems and tissues: Secondary | ICD-10-CM

## 2017-03-08 LAB — CBC WITH DIFFERENTIAL/PLATELET
BASOS PCT: 1 % (ref 0.0–3.0)
Basophils Absolute: 0.1 10*3/uL (ref 0.0–0.1)
Eosinophils Absolute: 0.2 10*3/uL (ref 0.0–0.7)
Eosinophils Relative: 1.5 % (ref 0.0–5.0)
HEMATOCRIT: 38.5 % (ref 36.0–46.0)
Hemoglobin: 12.6 g/dL (ref 12.0–15.0)
LYMPHS PCT: 24.7 % (ref 12.0–46.0)
Lymphs Abs: 2.8 10*3/uL (ref 0.7–4.0)
MCHC: 32.8 g/dL (ref 30.0–36.0)
MONOS PCT: 8.8 % (ref 3.0–12.0)
Monocytes Absolute: 1 10*3/uL (ref 0.1–1.0)
NEUTROS ABS: 7.2 10*3/uL (ref 1.4–7.7)
Neutrophils Relative %: 64 % (ref 43.0–77.0)
PLATELETS: 354 10*3/uL (ref 150.0–400.0)
RBC: 5.68 Mil/uL — ABNORMAL HIGH (ref 3.87–5.11)
RDW: 14.6 % (ref 11.5–15.5)
WBC: 11.3 10*3/uL — ABNORMAL HIGH (ref 4.0–10.5)

## 2017-03-14 DIAGNOSIS — L720 Epidermal cyst: Secondary | ICD-10-CM | POA: Diagnosis not present

## 2017-03-15 DIAGNOSIS — D485 Neoplasm of uncertain behavior of skin: Secondary | ICD-10-CM | POA: Diagnosis not present

## 2017-03-21 DIAGNOSIS — L905 Scar conditions and fibrosis of skin: Secondary | ICD-10-CM | POA: Diagnosis not present

## 2017-03-21 DIAGNOSIS — L720 Epidermal cyst: Secondary | ICD-10-CM | POA: Diagnosis not present

## 2017-03-21 DIAGNOSIS — L739 Follicular disorder, unspecified: Secondary | ICD-10-CM | POA: Diagnosis not present

## 2017-04-04 ENCOUNTER — Ambulatory Visit: Payer: 59 | Admitting: Family Medicine

## 2017-04-10 DIAGNOSIS — G4733 Obstructive sleep apnea (adult) (pediatric): Secondary | ICD-10-CM | POA: Diagnosis not present

## 2017-04-30 ENCOUNTER — Encounter: Payer: Self-pay | Admitting: Family Medicine

## 2017-04-30 ENCOUNTER — Ambulatory Visit (INDEPENDENT_AMBULATORY_CARE_PROVIDER_SITE_OTHER): Payer: 59 | Admitting: Family Medicine

## 2017-04-30 VITALS — BP 154/90 | HR 70 | Temp 98.1°F | Resp 12 | Ht 64.0 in | Wt 191.0 lb

## 2017-04-30 DIAGNOSIS — H00015 Hordeolum externum left lower eyelid: Secondary | ICD-10-CM

## 2017-04-30 DIAGNOSIS — E876 Hypokalemia: Secondary | ICD-10-CM | POA: Diagnosis not present

## 2017-04-30 DIAGNOSIS — E669 Obesity, unspecified: Secondary | ICD-10-CM | POA: Insufficient documentation

## 2017-04-30 DIAGNOSIS — G4733 Obstructive sleep apnea (adult) (pediatric): Secondary | ICD-10-CM

## 2017-04-30 DIAGNOSIS — E6609 Other obesity due to excess calories: Secondary | ICD-10-CM | POA: Diagnosis not present

## 2017-04-30 DIAGNOSIS — R718 Other abnormality of red blood cells: Secondary | ICD-10-CM | POA: Diagnosis not present

## 2017-04-30 DIAGNOSIS — I10 Essential (primary) hypertension: Secondary | ICD-10-CM | POA: Diagnosis not present

## 2017-04-30 DIAGNOSIS — Z6832 Body mass index (BMI) 32.0-32.9, adult: Secondary | ICD-10-CM

## 2017-04-30 DIAGNOSIS — R7303 Prediabetes: Secondary | ICD-10-CM

## 2017-04-30 MED ORDER — VALSARTAN 320 MG PO TABS: 320.0000 mg | ORAL_TABLET | Freq: Every day | ORAL | 6 refills | Status: DC

## 2017-04-30 NOTE — Progress Notes (Signed)
Subjective:    Patient ID: Melanie Little, female    DOB: 04-Apr-1971, 46 y.o.   MRN: [id number]  [facility] (is not fasting); Referral (BP running high- would like to be referred to cards); and Eye Irritation (L lower lid swelling on inner corner and sore ) Pt here to establish care, previous PCP Dr. Maudie Mercury, last seen in Dec 2017   Borderline DM, a1c 6.2% in Dec, Mild hyperlipidemia TG elevated at      GYN for PAP, Mammogram - history of abnormal PAP Smear with Colpo in 2016, also has fibroids    Hypertension- taking diovan 190m once a day , BP has not been controlled in quite some time, she initially saw a cardiologist had a medication but it caused her potassium to drop, has been on lisinopril caused "cough", norvasc- swelling in hands, benezapril "chest pains", clonidine " made her sleepy"   Eye irritation- feel bump to lower left eyelid started yesterday, no drainage, no change in vision   OSA- follows with Dr. DMaureen ChattersNeurology ,on CPAP   Chronic DDD cervical spine- has done PT in past   Chronic Hidradenitis- has seen derm in past and had surgery   Chronic low potassium   Review Of Systems:  GEN- denies fatigue, fever, weight loss,weakness, recent illness HEENT- denies eye drainage, change in vision, nasal discharge, CVS- denies chest pain, palpitations RESP- denies SOB, cough, wheeze ABD- denies N/V, change in stools, abd pain GU- denies dysuria, hematuria, dribbling, incontinence MSK- denies joint pain, muscle aches, injury Neuro- denies headache, dizziness, syncope, seizure activity       Objective:    BP (!) 154/90   Pulse 70   Temp 98.1 F (36.7 C) (Oral)   Resp 12   Ht 5' 4"  (1.626 m)   Wt 191 lb (86.6 kg)   LMP 04/06/2017 Comment: regular  SpO2 98%   BMI 32.79 kg/m  GEN- NAD, alert and oriented x3 HEENT- PERRL, EOMI, non injected sclera, small nodule left lower lid, no erythema pink conjunctiva, MMM, oropharynx  clear Neck- Supple, no thyromegaly CVS- RRR, no murmur RESP-CTAB ABD-NABS,soft,NT,ND Psych-normal affect and mood  EXT- No edema Pulses- Radial, DP- 2+        Assessment & Plan:      Problem List Items Addressed This Visit    OSA (obstructive sleep apnea)    Continue CPAP per neurology      Essential hypertension - Primary    Uncontrolled increase Diovan to 3229mAdd OTC low dose potasssium Check Mag level      Relevant Medications   valsartan (DIOVAN) 320 MG tablet   Other Relevant Orders   CBC with Differential/Platelet   Chronic hypokalemia   Relevant Orders   Magnesium    Other Visit Diagnoses    Hordeolum externum of left lower eyelid       Warm compresses no sign of infection   Low mean corpuscular volume (MCV)       Recheck CBC, no sign of true anemia ? if she has thalessemia, thsi may be chronic problem, [place] Relevant Orders   Ferritin   Iron   Vitamin B12   Borderline diabetes       RECHECK A1C discussed dietary changes, exercise    Relevant Orders   Comprehensive metabolic panel   Hemoglobin A1c   Vitamin B12      Note: This dictation was prepared with Dragon dictation  along with smaller phrase technology. Any transcriptional errors that result from this process are unintentional.

## 2017-04-30 NOTE — Assessment & Plan Note (Signed)
Continue CPAP per neurology

## 2017-04-30 NOTE — Patient Instructions (Addendum)
Get over the counter Potassium 31m  New dose of Diovan is 3237monce a day  We will check cbc/ferritin/iron/B12/A1C Releasae of records- [place] Dr. WiTeryl Lucy need last 2-3 years of notes and labs  F/U 4 weeks for blood pressure

## 2017-04-30 NOTE — Assessment & Plan Note (Addendum)
Uncontrolled increase Diovan to 378m Add OTC low dose potasssium Check Mag level

## 2017-05-01 LAB — CBC WITH DIFFERENTIAL/PLATELET
Basophils Absolute: 100 cells/uL (ref 0–200)
Basophils Relative: 1 %
EOS PCT: 1 %
Eosinophils Absolute: 100 cells/uL (ref 15–500)
HCT: 38.9 % (ref 35.0–45.0)
Hemoglobin: 12.7 g/dL (ref 12.0–15.0)
LYMPHS PCT: 28 %
Lymphs Abs: 2800 cells/uL (ref 850–3900)
MCH: 22.2 pg — ABNORMAL LOW (ref 27.0–33.0)
MCHC: 32.6 g/dL (ref 32.0–36.0)
MCV: 68 fL — AB (ref 80.0–100.0)
MONOS PCT: 7 %
MPV: 9.5 fL (ref 7.5–12.5)
Monocytes Absolute: 700 cells/uL (ref 200–950)
NEUTROS ABS: 6300 {cells}/uL (ref 1500–7800)
NEUTROS PCT: 63 %
PLATELETS: 344 10*3/uL (ref 140–400)
RBC: 5.72 MIL/uL — ABNORMAL HIGH (ref 3.80–5.10)
RDW: 16.6 % — ABNORMAL HIGH (ref 11.0–15.0)
WBC: 10 10*3/uL (ref 3.8–10.8)

## 2017-05-01 LAB — CMP 10231
AG Ratio: 1.4 Ratio (ref 1.0–2.5)
ALBUMIN: 4.1 g/dL (ref 3.6–5.1)
ALT: 14 U/L (ref 6–29)
AST: 16 U/L (ref 10–30)
Alkaline Phosphatase: 72 U/L (ref 33–115)
BUN/Creatinine Ratio: 13.7 Ratio (ref 6–22)
BUN: 10 mg/dL (ref 7–25)
CALCIUM: 9 mg/dL (ref 8.6–10.2)
CHLORIDE: 102 mmol/L (ref 98–110)
CO2: 22 mmol/L (ref 20–31)
Creat: 0.73 mg/dL (ref 0.50–1.10)
GFR, Est Non African American: >89 mL/min (ref >=60)
Globulin: 3 g/dL (ref 1.9–3.7)
Glucose, Bld: 77 mg/dL (ref 70–99)
Potassium: 3.3 mmol/L — ABNORMAL LOW (ref 3.5–5.3)
SODIUM: 138 mmol/L (ref 135–146)
Total Bilirubin: 1.1 mg/dL (ref 0.2–1.2)
Total Protein: 7.1 g/dL (ref 6.1–8.1)

## 2017-05-01 LAB — FERRITIN: Ferritin: 63 ng/mL (ref 10–232)

## 2017-05-01 LAB — VITAMIN B12: Vitamin B-12: 565 pg/mL (ref 200–1100)

## 2017-05-01 LAB — HEMOGLOBIN A1C
HEMOGLOBIN A1C: 6.2 % — AB (ref <5.7)
Mean Plasma Glucose: 131 mg/dL

## 2017-05-01 LAB — IRON: Iron: 97 ug/dL (ref 40–190)

## 2017-05-01 LAB — MAGNESIUM: Magnesium: 2 mg/dL (ref 1.5–2.5)

## 2017-05-20 ENCOUNTER — Encounter: Payer: Self-pay | Admitting: *Deleted

## 2017-05-28 ENCOUNTER — Ambulatory Visit (INDEPENDENT_AMBULATORY_CARE_PROVIDER_SITE_OTHER): Payer: 59 | Admitting: Family Medicine

## 2017-05-28 ENCOUNTER — Encounter: Payer: Self-pay | Admitting: Family Medicine

## 2017-05-28 VITALS — BP 138/88 | HR 78 | Temp 98.2°F | Resp 14 | Ht 64.0 in | Wt 191.0 lb

## 2017-05-28 DIAGNOSIS — Z6832 Body mass index (BMI) 32.0-32.9, adult: Secondary | ICD-10-CM | POA: Diagnosis not present

## 2017-05-28 DIAGNOSIS — E6609 Other obesity due to excess calories: Secondary | ICD-10-CM | POA: Diagnosis not present

## 2017-05-28 DIAGNOSIS — I10 Essential (primary) hypertension: Secondary | ICD-10-CM

## 2017-05-28 DIAGNOSIS — E876 Hypokalemia: Secondary | ICD-10-CM | POA: Diagnosis not present

## 2017-05-28 MED ORDER — PHENTERMINE HCL 37.5 MG PO TABS: 37.5000 mg | ORAL_TABLET | Freq: Every day | ORAL | 2 refills | Status: DC

## 2017-05-28 NOTE — Progress Notes (Signed)
   Subjective:    Patient ID: Melanie Little, female    DOB: Apr 22, 1971, 46 y.o.   MRN: [id number]  Patient presents for 4 week F/U (is not fasting)  Here for one-month follow-up on her blood pressure. I did obtain some records from her previous provider but there were no labs noted and very few records. He has history of a mild polychromasia which is what I was looking into the records for. [place] blood cells  were ormal on the recheck. Her hemoglobin was also normal. Diovan was increased to 320 mg she also has history of mild hypokalemia she was told to take over-the-counter potassium supplement  Diabetes mellitus A1c is 6.2% discussed dietary changes.See eating sweets after she has thinner. She often skips lunch. She is interested in a diet pill to help with her weight loss.  Review Of Systems:  GEN- denies fatigue, fever, weight loss,weakness, recent illness HEENT- denies eye drainage, change in vision, nasal discharge, CVS- denies chest pain, palpitations RESP- denies SOB, cough, wheeze ABD- denies N/V, change in stools, abd pain GU- denies dysuria, hematuria, dribbling, incontinence MSK- denies joint pain, muscle aches, injury Neuro- denies headache, dizziness, syncope, seizure activity       Objective:    BP 138/88   Pulse 78   Temp 98.2 F (36.8 C) (Oral)   Resp 14   Ht 5' 4"  (1.626 m)   Wt 191 lb (86.6 kg)   LMP  (LMP Unknown)   SpO2 98%   BMI 32.79 kg/m  GEN- NAD, alert and oriented x3 HEENT- PERRL, EOMI, non injected sclera, pink conjunctiva, MMM, oropharynx clear CVS- RRR, no murmur RESP-CTAB EXT- No edema Pulses- Radial 2+        Assessment & Plan:      Problem List Items Addressed This Visit    Obese    Obesity with risk factors of hypertension. As well as borderline diabetes. We'll start phentermine discussed side effects of medication she start with one half tablet for 2 weeks and increase to one tablet will follow up in 8 weeks for weight. Discussed  cutting out  processed foods and sugars. Also discussed some exercises.      Relevant Medications   phentermine (ADIPEX-P) 37.5 MG tablet   Essential hypertension    Blood pressure is much improved with current dose of medication. She does get some fatigue states she feels sleepy after taking. Advised that she can take at nighttime if need be.      Relevant Orders   Basic metabolic panel   Chronic hypokalemia - Primary    Recheck potassium level she is taking over-the-counter potassium supplement         Note: This dictation was prepared with Dragon dictation along with smaller phrase technology. Any transcriptional errors that result from this process are unintentional.

## 2017-05-28 NOTE — Assessment & Plan Note (Signed)
Blood pressure is much improved with current dose of medication. She does get some fatigue states she feels sleepy after taking. Advised that she can take at nighttime if need be.

## 2017-05-28 NOTE — Assessment & Plan Note (Signed)
Recheck potassium level she is taking over-the-counter potassium supplement

## 2017-05-28 NOTE — Patient Instructions (Addendum)
Take phentermine in the morning  Start with 1/2 tablet for 2 weeks, then go to 1 tablet daily  We will call with lab results  F/U 2 months for weight

## 2017-05-28 NOTE — Assessment & Plan Note (Signed)
Obesity with risk factors of hypertension. As well as borderline diabetes. We'll start phentermine discussed side effects of medication she start with one half tablet for 2 weeks and increase to one tablet will follow up in 8 weeks for weight. Discussed cutting out  processed foods and sugars. Also discussed some exercises.

## 2017-05-29 LAB — BASIC METABOLIC PANEL
BUN: 6 mg/dL — AB (ref 7–25)
CHLORIDE: 102 mmol/L (ref 98–110)
CO2: 26 mmol/L (ref 20–31)
Calcium: 9.3 mg/dL (ref 8.6–10.2)
Creat: 0.63 mg/dL (ref 0.50–1.10)
Glucose, Bld: 87 mg/dL (ref 70–99)
POTASSIUM: 4.2 mmol/L (ref 3.5–5.3)
Sodium: 139 mmol/L (ref 135–146)

## 2017-07-16 ENCOUNTER — Telehealth: Payer: Self-pay | Admitting: *Deleted

## 2017-07-16 NOTE — Telephone Encounter (Signed)
Received call from patient.  Reports voluntary recall of Valsartan that are made by [employer], [employer] and [employer]. This recall is due to an impurity, N-Nitrosodimethylamine ([place]), which was found in the recalled products. [place] is classified as a probable human carcinogen.  Patient is concerned about taking medication.   MD please advise.

## 2017-07-16 NOTE — Telephone Encounter (Signed)
Change to losartan 111m daily, D/C valsartan

## 2017-07-17 MED ORDER — LOSARTAN POTASSIUM 100 MG PO TABS: 100.0000 mg | ORAL_TABLET | Freq: Every day | ORAL | 3 refills | Status: DC

## 2017-07-17 NOTE — Telephone Encounter (Signed)
Call placed to patient and patient made aware.  

## 2017-07-17 NOTE — Telephone Encounter (Signed)
Prescription sent to pharmacy.   Call placed to patient. [place].

## 2017-07-30 ENCOUNTER — Encounter: Payer: Self-pay | Admitting: Family Medicine

## 2017-07-30 ENCOUNTER — Ambulatory Visit (INDEPENDENT_AMBULATORY_CARE_PROVIDER_SITE_OTHER): Payer: 59 | Admitting: Family Medicine

## 2017-07-30 VITALS — BP 128/78 | HR 82 | Temp 98.2°F | Resp 12 | Ht 64.0 in | Wt 180.0 lb

## 2017-07-30 DIAGNOSIS — L720 Epidermal cyst: Secondary | ICD-10-CM | POA: Diagnosis not present

## 2017-07-30 DIAGNOSIS — L0292 Furuncle, unspecified: Secondary | ICD-10-CM | POA: Insufficient documentation

## 2017-07-30 DIAGNOSIS — I10 Essential (primary) hypertension: Secondary | ICD-10-CM

## 2017-07-30 DIAGNOSIS — Z6832 Body mass index (BMI) 32.0-32.9, adult: Secondary | ICD-10-CM

## 2017-07-30 DIAGNOSIS — E6609 Other obesity due to excess calories: Secondary | ICD-10-CM | POA: Diagnosis not present

## 2017-07-30 MED ORDER — PHENTERMINE HCL 37.5 MG PO TABS: 37.5000 mg | ORAL_TABLET | Freq: Every day | ORAL | 2 refills | Status: DC

## 2017-07-30 NOTE — Patient Instructions (Addendum)
Mediterranean diet  Keto diet ( Low carb/ HIgh Fat ) Increase water, increase fiber F/U 3 months

## 2017-07-30 NOTE — Progress Notes (Signed)
   Subjective:    Patient ID: Melanie Little, female    DOB: [birth year], 46 y.o.   MRN: [id number]  Patient presents for Follow-up (is fasting) and Cysts (states that she has 2 bump like areas to back that are like pimples, but they have been there for years)   Pt hre to f/u weight loss, started on phentermine 2 months ago, weight was 191lbs  Nutrition- Cut out some sweets , noticed decrease in appetite on the medication, often skips lunch, dinner burgers/ fried foods, fries, chicken tenders, some veggies Noticed se only drinks `1 bottle a day, trying to increase water   Exercise- None     Has 2 knots on her back, present for years, sometimes has thick discharge with smell, no pain recently    Review Of Systems:  GEN- denies fatigue, fever, weight loss,weakness, recent illness HEENT- denies eye drainage, change in vision, nasal discharge, CVS- denies chest pain, palpitations RESP- denies SOB, cough, wheeze ABD- denies N/V, change in stools, abd pain GU- denies dysuria, hematuria, dribbling, incontinence MSK- denies joint pain, muscle aches, injury Neuro- denies headache, dizziness, syncope, seizure activity       Objective:    BP 128/78   Pulse 82   Temp 98.2 F (36.8 C) (Oral)   Resp 12   Ht 5' 4"  (1.626 m)   Wt 180 lb (81.6 kg)   SpO2 98%   BMI 30.90 kg/m  GEN- NAD, alert and oriented x3 HEENT- PERRL, EOMI, non injected sclera, pink conjunctiva, MMM, oropharynx clear Neck- Supple, no thyromegaly CVS- RRR, no murmur RESP-CTAB Skin- upper left back epidermoid cyst pit noted at center, small cyst to left of lower spine, NT Pulses- Radial  2+        Assessment & Plan:      Problem List Items Addressed This Visit    Obese    Improved weight loss with phentermine Discussed further nutritional changes, and exercise Concern once she stops medication will regain weight at this time due to large amounts of carbs      Relevant Medications   phentermine (ADIPEX-P)  37.5 MG tablet   Essential hypertension - Primary    Well controlled, doing well on losartan, valsartan discontinued due to recall      Epidermal cyst    Discussed surgical removal is cure, can drain if they get inflammed She wants to hold off for now         Note: This dictation was prepared with Dragon dictation along with smaller phrase technology. Any transcriptional errors that result from this process are unintentional.

## 2017-07-31 NOTE — Assessment & Plan Note (Signed)
Well controlled, doing well on losartan, valsartan discontinued due to recall

## 2017-07-31 NOTE — Assessment & Plan Note (Signed)
Discussed surgical removal is cure, can drain if they get inflammed She wants to hold off for now

## 2017-07-31 NOTE — Assessment & Plan Note (Signed)
Improved weight loss with phentermine Discussed further nutritional changes, and exercise Concern once she stops medication will regain weight at this time due to large amounts of carbs

## 2017-08-30 ENCOUNTER — Ambulatory Visit: Payer: 59 | Admitting: Nurse Practitioner

## 2017-08-30 ENCOUNTER — Ambulatory Visit (INDEPENDENT_AMBULATORY_CARE_PROVIDER_SITE_OTHER): Payer: 59 | Admitting: Obstetrics and Gynecology

## 2017-08-30 ENCOUNTER — Encounter: Payer: Self-pay | Admitting: Obstetrics and Gynecology

## 2017-08-30 ENCOUNTER — Other Ambulatory Visit (HOSPITAL_COMMUNITY)
Admission: RE | Admit: 2017-08-30 | Discharge: 2017-08-30 | Disposition: A | Discharge status: Home / Self Care | Payer: 59 | Source: Ambulatory Visit | Attending: Obstetrics and Gynecology | Admitting: Obstetrics and Gynecology

## 2017-08-30 VITALS — BP 142/100 | HR 88 | Resp 16 | Ht 64.75 in | Wt 183.0 lb

## 2017-08-30 DIAGNOSIS — Z01419 Encounter for gynecological examination (general) (routine) without abnormal findings: Secondary | ICD-10-CM | POA: Diagnosis not present

## 2017-08-30 DIAGNOSIS — N83209 Unspecified ovarian cyst, unspecified side: Secondary | ICD-10-CM | POA: Insufficient documentation

## 2017-08-30 DIAGNOSIS — D259 Leiomyoma of uterus, unspecified: Secondary | ICD-10-CM | POA: Insufficient documentation

## 2017-08-30 DIAGNOSIS — N83202 Unspecified ovarian cyst, left side: Secondary | ICD-10-CM

## 2017-08-30 NOTE — Progress Notes (Signed)
46 y.o. [id number] Married [place] American female here for annual exam.    Asking about birth control options.  Has HTN.  Did not take her blood pressure med today.   Concerned about risk of stroke.  Taking Adipex.  Now an a new antiHTN.   Menses regular.  Cycles last for 4 days.  Uses overnight pads or clotting, but does not stain clothing.  No dizziness or lightheadedness.  No left sided pain at rest.  Has migraine prior to and during cycles.  Takes Excedrin or ibuprofen.   Does yearly pelvic ultrasound to follow up of simple left ovarian cyst 42 mm  and several fibroids.  Normal CA125.  Had benign EMB.  Asking about trichomonas for a case at work.   PCP: Dr. Vic Blackbird  Patient's last menstrual period was 08/14/2017 (within days).           Sexually active: Yes.    The current method of family planning is OTC vaginal inserts.    Exercising: No.  The patient does not participate in regular exercise at present. Smoker:  no  Health Maintenance: Pap: 08/29/16 Pap and HR HPV negative  08/01/15, ASCUS with pos HR HPV; Colpo 08/18/16, CIN I  History of abnormal Pap:  yes MMG:  12/03/16 BIRADS 1 negative/density b Colonoscopy:  n/a BMD:   n/a  Result   n/a TDaP:  2011 Gardasil:   n/a HIV: 08/01/15 Negative Hep C: never Screening Labs: PCP takes care of labs   reports that she has never smoked. She has never used smokeless tobacco. She reports that she does not drink alcohol or use drugs.  Past Medical History:  Diagnosis Date  . Abnormal Pap smear of cervix about 2013, 2016   Colpo biopsy with normal result per pt.  colpo 2016 HPV changes  . DUB (dysfunctional uterine bleeding)    around 2012, saw gyn at the time for eval  . Fibroid    multiple fibroids  . Hypertension   . Left ovarian cyst    do yearly ultrasound    Past Surgical History:  Procedure Laterality Date  . BREAST REDUCTION SURGERY  2005  . CHOLECYSTECTOMY  2004  . INCISION AND DRAINAGE Bilateral  2015   Bilateral axillary region ebaceous cyst    Current Outpatient Prescriptions  Medication Sig Dispense Refill  . Black Currant Seed Oil 500 MG CAPS Take by mouth. 2 TBS PO QD    . chlorhexidine (PERIDEX) 0.12 % solution Rinse with 1/2 ounce by mouth for 30 seconds then spit out. use twice a day  0  . losartan (COZAAR) 100 MG tablet Take 1 tablet (100 mg total) by mouth daily. 90 tablet 3  . phentermine (ADIPEX-P) 37.5 MG tablet Take 1 tablet (37.5 mg total) by mouth daily before breakfast. 30 tablet 2   No current facility-administered medications for this visit.     Family History  Problem Relation Age of Onset  . CVA Mother 73       deceased, was 87 at the age of her first stroke  . Diabetes Father   . Deep vein thrombosis Father   . Lupus Sister        questionable diagnosis per patient  . Breast cancer Maternal Aunt   . Breast cancer Maternal Aunt     ROS:  Pertinent items are noted in HPI.  Otherwise, a comprehensive ROS was negative.  Exam:   BP (!) 142/100 (BP Location: Right Arm, Patient Position: Sitting, Cuff  Size: Large)   Pulse 88   Resp 16   Ht 5' 4.75" (1.645 m)   Wt 183 lb (83 kg)   LMP 08/14/2017 (Within Days)   BMI 30.69 kg/m     General appearance: alert, cooperative and appears stated age Head: Normocephalic, without obvious abnormality, atraumatic Neck: no adenopathy, supple, symmetrical, trachea midline and thyroid normal to inspection and palpation Lungs: clear to auscultation bilaterally Breasts:  Consistent with reduction and keloids, no masses or tenderness, No nipple retraction or dimpling, No nipple discharge or bleeding, No axillary or supraclavicular adenopathy Heart: regular rate and rhythm Abdomen: soft, non-tender; no masses, no organomegaly Extremities: extremities normal, atraumatic, no cyanosis or edema Skin: Skin color, texture, turgor normal. No rashes or lesions Lymph nodes: Cervical, supraclavicular, and axillary nodes  normal. No abnormal inguinal nodes palpated Neurologic: Grossly normal  Pelvic: External genitalia:  no lesions              Urethra:  normal appearing urethra with no masses, tenderness or lesions              Bartholins and Skenes: normal                 Vagina: normal appearing vagina with normal color and discharge, no lesions              Cervix: no lesions              Pap taken: Yes.   Bimanual Exam:  Uterus:   12 - 13 week size, nontender.              Adnexa: no mass, fullness, tenderness              Rectal exam: Yes.  .  Confirms.              Anus:  normal sphincter tone, no lesions  Chaperone was present for exam.  Assessment:   Well woman visit with normal exam. Fibroids.  Large ovarian cyst.  Hx CIN I. Keloid scars form breast reduction.   Plan: Mammogram screening discussed. Recommended self breast awareness. Pap and HR HPV as above. Guidelines for Calcium, Vitamin D, regular exercise program including cardiovascular and weight bearing exercise. Return for pelvic US which we are doing yearly.  Discussed options for estrogen free contraception including permanent contraception of men and women. She declines Rx.  Up to Date search on trichomonas to patient.  I discussed phentermine can raise BP. Follow up annually and prn.      After visit summary provided.

## 2017-08-30 NOTE — Patient Instructions (Signed)

## 2017-09-04 ENCOUNTER — Ambulatory Visit (INDEPENDENT_AMBULATORY_CARE_PROVIDER_SITE_OTHER): Payer: 59 | Admitting: Family Medicine

## 2017-09-04 ENCOUNTER — Encounter: Payer: Self-pay | Admitting: Family Medicine

## 2017-09-04 VITALS — BP 140/102 | HR 100 | Temp 98.1°F | Resp 16 | Ht 64.0 in | Wt 181.8 lb

## 2017-09-04 DIAGNOSIS — G542 Cervical root disorders, not elsewhere classified: Secondary | ICD-10-CM | POA: Diagnosis not present

## 2017-09-04 DIAGNOSIS — M545 Low back pain, unspecified: Secondary | ICD-10-CM

## 2017-09-04 DIAGNOSIS — M62838 Other muscle spasm: Secondary | ICD-10-CM

## 2017-09-04 DIAGNOSIS — I10 Essential (primary) hypertension: Secondary | ICD-10-CM

## 2017-09-04 DIAGNOSIS — M5020 Other cervical disc displacement, unspecified cervical region: Secondary | ICD-10-CM | POA: Diagnosis not present

## 2017-09-04 DIAGNOSIS — G8929 Other chronic pain: Secondary | ICD-10-CM | POA: Diagnosis not present

## 2017-09-04 DIAGNOSIS — M5030 Other cervical disc degeneration, unspecified cervical region: Secondary | ICD-10-CM

## 2017-09-04 MED ORDER — CYCLOBENZAPRINE HCL 5 MG PO TABS: 5.0000 mg | ORAL_TABLET | Freq: Every evening | ORAL | 0 refills | Status: DC | PRN
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
## 2017-09-04 NOTE — Assessment & Plan Note (Signed)
Blood pressure elevated but she does take her medication. She also has a stimulant medication but she has been on this for some time. Advised her to recheck her blood pressure in a few hours once her medication has sat in she will also just keep an iodine general. Still staying elevated despite the losartan work and it decreased on her stimulant first and see how her body response.

## 2017-09-04 NOTE — Patient Instructions (Addendum)
MRI  Xray lumbar spine  F/U as previous

## 2017-09-05 LAB — CYTOLOGY - PAP
Diagnosis: NEGATIVE
HPV: NOT DETECTED

## 2017-09-11 ENCOUNTER — Telehealth: Payer: Self-pay | Admitting: Obstetrics and Gynecology

## 2017-09-11 NOTE — Telephone Encounter (Signed)
Call placed to patient to review benefits for a recommended ultrasound. Left voicemail message requesting a return call.

## 2017-09-12 NOTE — Telephone Encounter (Signed)
Patient returned call. Reviewed benefit for ultrasound. Patient understood and agreeable. Patient ready to schedule. Patient scheduled 10/03/17 with Dr Quincy Simmonds. Patient aware of appointment date, arrival time and cancellation policy. No further questions. Ok to close

## 2017-09-16 ENCOUNTER — Ambulatory Visit
Admission: RE | Admit: 2017-09-16 | Discharge: 2017-09-16 | Disposition: A | Discharge status: Home / Self Care | Payer: 59 | Source: Ambulatory Visit | Attending: Family Medicine | Admitting: Family Medicine

## 2017-09-16 DIAGNOSIS — M50222 Other cervical disc displacement at C5-C6 level: Secondary | ICD-10-CM | POA: Diagnosis not present

## 2017-09-16 DIAGNOSIS — M62838 Other muscle spasm: Secondary | ICD-10-CM

## 2017-09-16 DIAGNOSIS — M48061 Spinal stenosis, lumbar region without neurogenic claudication: Secondary | ICD-10-CM | POA: Diagnosis not present

## 2017-09-16 DIAGNOSIS — M5030 Other cervical disc degeneration, unspecified cervical region: Secondary | ICD-10-CM

## 2017-09-16 DIAGNOSIS — M5020 Other cervical disc displacement, unspecified cervical region: Secondary | ICD-10-CM

## 2017-09-16 DIAGNOSIS — G8929 Other chronic pain: Secondary | ICD-10-CM

## 2017-09-16 DIAGNOSIS — G542 Cervical root disorders, not elsewhere classified: Secondary | ICD-10-CM

## 2017-09-16 DIAGNOSIS — M545 Low back pain: Principal | ICD-10-CM

## 2017-09-26 ENCOUNTER — Other Ambulatory Visit: Payer: Self-pay | Admitting: *Deleted

## 2017-09-26 DIAGNOSIS — M5030 Other cervical disc degeneration, unspecified cervical region: Secondary | ICD-10-CM

## 2017-09-26 DIAGNOSIS — G8929 Other chronic pain: Secondary | ICD-10-CM

## 2017-09-26 DIAGNOSIS — M545 Low back pain: Secondary | ICD-10-CM

## 2017-10-02 ENCOUNTER — Encounter: Payer: Self-pay | Admitting: Family Medicine

## 2017-10-02 ENCOUNTER — Encounter: Payer: Self-pay | Admitting: Physician Assistant

## 2017-10-02 ENCOUNTER — Ambulatory Visit (INDEPENDENT_AMBULATORY_CARE_PROVIDER_SITE_OTHER): Payer: 59 | Admitting: Physician Assistant

## 2017-10-02 VITALS — BP 154/90 | HR 82 | Temp 98.1°F | Resp 14 | Ht 64.0 in | Wt 181.0 lb

## 2017-10-02 DIAGNOSIS — I10 Essential (primary) hypertension: Secondary | ICD-10-CM | POA: Diagnosis not present

## 2017-10-02 MED ORDER — NEBIVOLOL HCL 10 MG PO TABS: 10.0000 mg | ORAL_TABLET | Freq: Every day | ORAL | 0 refills | Status: DC

## 2017-10-02 NOTE — Progress Notes (Signed)
Patient ID: Yeng Frankie MRN: [id number], DOB: 11/24/1971, 46 y.o. Date of Encounter: 10/02/2017, 4:49 PM    Chief Complaint:  Chief Complaint  Patient presents with  . Hypertension    BP remains elevated     HPI: 46 y.o. year old female presents with above.   Her husband accompanies her for her visit today.  I reviewed her recent office note with Dr. Buelah Manis on 09/04/17. That note made mention that patient had been on stimulant medication but had been on that for some time. Note mentioned that her blood pressure was elevated that day and that she was to recheck blood pressure and monitor. At the time of that visit she was taking losartan 100 mg daily for blood pressure and also was on phentermine.  Today patient states that she has been checking blood pressure at home. Has been getting high readings. Thought that her pressure machine may be inaccurate so she also checked her husband and her sons but they were getting normal readings.  She states that she has been getting readings such as right around 200/100, 180/90, 160/90. Says that she had nurse at work checked it one day and they got 170/95 and 200/105.  She stopped taking the Phentermine this past Friday secondary to high blood pressure readings.  She is taking the losartan 100 mg daily.   She has been having no chest pressure or chest heaviness or chest tightness. No increased shortness of breath or dyspnea on exertion.  She states that she has used other blood pressure medications in the past that caused adverse effects.  I then reviewed her initial office note with Dr. Buelah Manis from 04/30/17 and the information was in that note.  At the time of that visit patient was taking Diovan but because this was recently recalled that was changed to the current losartan 100 mg. Medications which she has had adverse effects to in the past: 1 medication caused potassium to drop.  She also is reported that she had been on lisinopril which  caused a cough. She also reported the Norvasc caused swelling in her hands. She also reported that benazepril caused "chest pains " She also reported the clonidine "made her sleepy "  Home Meds:   Outpatient Medications Prior to Visit  Medication Sig Dispense Refill  . chlorhexidine (PERIDEX) 0.12 % solution Rinse with 1/2 ounce by mouth for 30 seconds then spit out. use twice a day  0  . cyclobenzaprine (FLEXERIL) 5 MG tablet Take 1 tablet (5 mg total) by mouth at bedtime as needed for muscle spasms. 20 tablet 0  . losartan (COZAAR) 100 MG tablet Take 1 tablet (100 mg total) by mouth daily. 90 tablet 3  . Black Currant Seed Oil 500 MG CAPS Take by mouth. 2 TBS PO QD    . phentermine (ADIPEX-P) 37.5 MG tablet Take 1 tablet (37.5 mg total) by mouth daily before breakfast. (Patient not taking: Reported on 10/02/2017) 30 tablet 2   No facility-administered medications prior to visit.     Allergies: No Known Allergies    Review of Systems: See HPI for pertinent ROS. All other ROS negative.    Physical Exam: Blood pressure (!) 154/90, pulse 82, temperature 98.1 F (36.7 C), temperature source Oral, resp. rate 14, height 5' 4"  (1.626 m), weight 82.1 kg (181 lb), SpO2 97 %., Body mass index is 31.07 kg/m. General:  AAF. Appears in no acute distress. Neck: Supple. No thyromegaly. No lymphadenopathy. Lungs: Clear bilaterally to  auscultation without wheezes, rales, or rhonchi. Breathing is unlabored. Heart: Regular rhythm. No murmurs, rubs, or gallops. Msk:  Strength and tone normal for age. Extremities/Skin: Warm and dry. Neuro: Alert and oriented X 3. Moves all extremities spontaneously. Gait is normal. CNII-XII grossly in tact. Psych:  Responds to questions appropriately with a normal affect.     ASSESSMENT AND PLAN:  46 y.o. year old female with  1. Essential hypertension She states that she has used other blood pressure medications in the past that caused adverse effects.  I  then reviewed her initial office note with Dr. Buelah Manis from 04/30/17 and the information was in that note.  At the time of that visit patient was taking Diovan but because this was recently recalled that was changed to the current losartan 100 mg. Medications which she has had adverse effects to in the past: 1 medication caused potassium to drop.  She also is reported that she had been on lisinopril which caused a cough. She also reported the Norvasc caused swelling in her hands. She also reported that benazepril caused "chest pains " She also reported the clonidine "made her sleepy "  Today I gave her 1 sample bottle of Bystolic 5 mg. This contains 7 tablets. She is to take 1 daily. She can monitor her blood pressure with her home machine. If blood pressure is still suboptimal after being on this dose then will increase to the Bystolic 10 mg daily. I also gave her 1 sample bottle of this which contain 7 tablets. Given that her blood pressure has been reading so high I would suspect that she is going to need the 10 mg dose. I went ahead and sent in prescription for the 10 mg dose for #90 and I gave her the savings card for #90.  She states that she was seeing a cardiologist in [place] and is requesting a referral to follow-up with a cardiologist.  I will go ahead and place that order but she does need to return here in 2 weeks to follow-up her blood pressure in the interim. She voices understanding and agrees.  Discussed that she is to also continue taking that losartan 100 mg daily. Will add the Bystolic in addition to this.  - nebivolol (BYSTOLIC) 10 MG tablet; Take 1 tablet (10 mg total) by mouth daily.  Dispense: 90 tablet; Refill: 0 - Ambulatory referral to Cardiology   Signed, Olean Ree [place], [place], [facility] 10/02/2017 4:49 PM

## 2017-10-03 ENCOUNTER — Ambulatory Visit (INDEPENDENT_AMBULATORY_CARE_PROVIDER_SITE_OTHER): Payer: 59

## 2017-10-03 ENCOUNTER — Ambulatory Visit (INDEPENDENT_AMBULATORY_CARE_PROVIDER_SITE_OTHER): Payer: 59 | Admitting: Obstetrics and Gynecology

## 2017-10-03 VITALS — BP 140/88 | HR 66 | Ht 63.75 in | Wt 181.0 lb

## 2017-10-03 DIAGNOSIS — N83202 Unspecified ovarian cyst, left side: Secondary | ICD-10-CM

## 2017-10-03 DIAGNOSIS — D219 Benign neoplasm of connective and other soft tissue, unspecified: Secondary | ICD-10-CM | POA: Diagnosis not present

## 2017-10-03 DIAGNOSIS — D259 Leiomyoma of uterus, unspecified: Secondary | ICD-10-CM

## 2017-10-03 NOTE — Progress Notes (Signed)
Encounter reviewed by Dr. Aundria Rud.

## 2017-10-03 NOTE — Progress Notes (Signed)
Patient ID: Melanie Little, female   DOB: 03/21/1971, 46 y.o.   MRN: [id number] GYNECOLOGY  VISIT   HPI: 46 y.o.   Married  [place] American  female   [phone] with Patient's last menstrual period was 09/04/2017 (approximate).   here for pelvic ultrasound for recheck on fibroid and large left ovarian cyst.    Has some fluctuation of the left ovarian cyst over time.  Has had it for years.  [place] 8/16 -  6 x 5 x 7 cm [place] 11/16 - 4.5 x 3.5 x 5 cm Today - 5.5 x 4 cm  CA125 31 11/11/15.  No pelvic pain, pressure, or abnormal cycles.   Just started on Bystolic for improved BP control. Was up to 884Z systolic.    GYNECOLOGIC HISTORY: Patient's last menstrual period was 09/04/2017 (approximate). Contraception:  OTC vaginal inserts Menopausal hormone therapy:  n/a Last mammogram: 12/03/16 BIRADS 1 negative/density b Last pap smear: 08-30-17 Neg:Neg HR HPV,                               08/29/16 Pap and HR HPV negative                             08/01/15, ASCUS with pos HR HPV;                             Colpo 08/18/16, CIN I         OB History    Gravida Para Term Preterm AB Living   4 1 1  0 3 1   SAB TAB Ectopic Multiple Live Births   0 3 0 0 1         Patient Active Problem List   Diagnosis Date Noted  . Epidermal cyst 07/30/2017  . OSA (obstructive sleep apnea) 04/30/2017  . Chronic hypokalemia 04/30/2017  . Obese 04/30/2017  . Subcutaneous nodule 12/03/2016  . Fibroid, uterine 08/19/2016  . History of abnormal cervical Pap smear 08/19/2016  . Snoring 10/13/2015  . Hypersomnia with sleep apnea 10/13/2015  . Menorrhagia with regular cycle 09/19/2015  . Left ovarian cyst 09/19/2015  . Essential hypertension 06/08/2015    Past Medical History:  Diagnosis Date  . Abnormal Pap smear of cervix about 2013, 2016   Colpo biopsy with normal result per pt.  colpo 2016 HPV changes  . DUB (dysfunctional uterine bleeding)    around 2012, saw gyn at the time for eval  . Fibroid    multiple fibroids  . Hypertension   . Left ovarian cyst    do yearly ultrasound    Past Surgical History:  Procedure Laterality Date  . BREAST REDUCTION SURGERY  2005  . CHOLECYSTECTOMY  2004  . INCISION AND DRAINAGE Bilateral 2015   Bilateral axillary region ebaceous cyst    Current Outpatient Prescriptions  Medication Sig Dispense Refill  . chlorhexidine (PERIDEX) 0.12 % solution Rinse with 1/2 ounce by mouth for 30 seconds then spit out. use twice a day  0  . losartan (COZAAR) 100 MG tablet Take 1 tablet (100 mg total) by mouth daily. 90 tablet 3  . nebivolol (BYSTOLIC) 10 MG tablet Take 1 tablet (10 mg total) by mouth daily. 90 tablet 0   No current facility-administered medications for this visit.      ALLERGIES: Patient has no known allergies.  Family History  Problem Relation Age of Onset  . CVA Mother 40       deceased, was 11 at the age of her first stroke  . Diabetes Father   . Deep vein thrombosis Father   . Lupus Sister        questionable diagnosis per patient  . Breast cancer Maternal Aunt   . Breast cancer Maternal Aunt     Social History   Social History  . Marital status: Married    Spouse name: N/A  . Number of children: N/A  . Years of education: N/A   Occupational History  . [employer]    Social History Main Topics  . Smoking status: Never Smoker  . Smokeless tobacco: Never Used  . Alcohol use No  . Drug use: No  . Sexual activity: Yes    Birth control/ protection: Other-see comments     Comment: OTC vaginal pill   Other Topics Concern  . Not on file   Social History Narrative   Work or School: Education officer, 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[employer]      Home Situation: lives with husband      Spiritual Beliefs: Baptist      Lifestyle: walks every other day; diet is bad          ROS:  Pertinent items are noted in HPI.  PHYSICAL EXAMINATION:    BP 140/88 (BP Location: Right Arm, Patient Position:  Sitting, Cuff Size: Normal)   Pulse 66   Ht 5' 3.75" (1.619 m)   Wt 181 lb (82.1 kg)   LMP 09/04/2017 (Approximate)   BMI 31.31 kg/m     General appearance: alert, cooperative and appears stated age   Uterus Multiple fibroids 1 - 3 cm in size.  EMS 8.98 mm. Right ovary with small hemorrhagic CL cyst.  Left ovary with 55 x 40 mm avascular cyst with smooth margins and echo free.   ASSESSMENT  Fibroids.  Left ovarian cyst.  Slight increase in size since last exam.  Size is waxing and waning slightly. Probably cystadenoma.  PLAN  Check CA 125.  Discussed laparoscopic removal of ovary and possible bilateral salpingectomy.   She prefers observational management as long as the CA 125 is supportive of a benign ovarian cyst.  We talked about potential benign conditions that can increase the CA125.  Needs at least yearly pelvic ultrasound.    An After Visit Summary was printed and given to the patient.  ___15___ minutes face to face time of which over 50% was spent in counseling.

## 2017-10-03 NOTE — Patient Instructions (Signed)
Ovarian Cyst An ovarian cyst is a fluid-filled sac that forms on an ovary. The ovaries are small organs that produce eggs in women. Various types of cysts can form on the ovaries. Some may cause symptoms and require treatment. Most ovarian cysts go away on their own, are not cancerous (are benign), and do not cause problems. Common types of ovarian cysts include:  Functional (follicle) cysts. ? Occur during the menstrual cycle, and usually go away with the next menstrual cycle if you do not get pregnant. ? Usually cause no symptoms.  Endometriomas. ? Are cysts that form from the tissue that lines the uterus (endometrium). ? Are sometimes called "chocolate cysts" because they become filled with blood that turns brown. ? Can cause pain in the lower abdomen during intercourse and during your period.  Cystadenoma cysts. ? Develop from cells on the outside surface of the ovary. ? Can get very large and cause lower abdomen pain and pain with intercourse. ? Can cause severe pain if they twist or break open (rupture).  Dermoid cysts. ? Are sometimes found in both ovaries. ? May contain different kinds of body tissue, such as skin, teeth, hair, or cartilage. ? Usually do not cause symptoms unless they get very big.  Theca lutein cysts. ? Occur when too much of a certain hormone (human chorionic gonadotropin) is produced and overstimulates the ovaries to produce an egg. ? Are most common after having procedures used to assist with the conception of a baby (in vitro fertilization).  What are the causes? Ovarian cysts may be caused by:  Ovarian hyperstimulation syndrome. This is a condition that can develop from taking fertility medicines. It causes multiple large ovarian cysts to form.  Polycystic ovarian syndrome (PCOS). This is a common hormonal disorder that can cause ovarian cysts, as well as problems with your period or fertility.  What increases the risk? The following factors may make  you more likely to develop ovarian cysts:  Being overweight or obese.  Taking fertility medicines.  Taking certain forms of hormonal birth control.  Smoking.  What are the signs or symptoms? Many ovarian cysts do not cause symptoms. If symptoms are present, they may include:  Pelvic pain or pressure.  Pain in the lower abdomen.  Pain during sex.  Abdominal swelling.  Abnormal menstrual periods.  Increasing pain with menstrual periods.  How is this diagnosed? These cysts are commonly found during a routine pelvic exam. You may have tests to find out more about the cyst, such as:  Ultrasound.  X-ray of the pelvis.  CT scan.  MRI.  Blood tests.  How is this treated? Many ovarian cysts go away on their own without treatment. Your health care provider may want to check your cyst regularly for 2-3 months to see if it changes. If you are in menopause, it is especially important to have your cyst monitored closely because menopausal women have a higher rate of ovarian cancer. When treatment is needed, it may include:  Medicines to help relieve pain.  A procedure to drain the cyst (aspiration).  Surgery to remove the whole cyst.  Hormone treatment or birth control pills. These methods are sometimes used to help dissolve a cyst.  Follow these instructions at home:  Take over-the-counter and prescription medicines only as told by your health care provider.  Do not drive or use heavy machinery while taking prescription pain medicine.  Get regular pelvic exams and Pap tests as often as told by your health care  provider.  Return to your normal activities as told by your health care provider. Ask your health care provider what activities are safe for you.  Do not use any products that contain nicotine or tobacco, such as cigarettes and e-cigarettes. If you need help quitting, ask your health care provider.  Keep all follow-up visits as told by your health care provider.  This is important. Contact a health care provider if:  Your periods are late, irregular, or painful, or they stop.  You have pelvic pain that does not go away.  You have pressure on your bladder or trouble emptying your bladder completely.  You have pain during sex.  You have any of the following in your abdomen: ? A feeling of fullness. ? Pressure. ? Discomfort. ? Pain that does not go away. ? Swelling.  You feel generally ill.  You become constipated.  You lose your appetite.  You develop severe acne.  You start to have more body hair and facial hair.  You are gaining weight or losing weight without changing your exercise and eating habits.  You think you may be pregnant. Get help right away if:  You have abdominal pain that is severe or gets worse.  You cannot eat or drink without vomiting.  You suddenly develop a fever.  Your menstrual period is much heavier than usual. This information is not intended to replace advice given to you by your health care provider. Make sure you discuss any questions you have with your health care provider. Document Released: 12/17/2005 Document Revised: 07/06/2016 Document Reviewed: 05/20/2016 Elsevier Interactive Patient Education  [employer].

## 2017-10-04 ENCOUNTER — Encounter: Payer: Self-pay | Admitting: Obstetrics and Gynecology

## 2017-10-04 LAB — CA 125: CANCER ANTIGEN (CA) 125: 59.4 U/mL — AB (ref 0.0–38.1)

## 2017-10-07 ENCOUNTER — Other Ambulatory Visit: Payer: Self-pay | Admitting: *Deleted

## 2017-10-07 DIAGNOSIS — R971 Elevated cancer antigen 125 [CA 125]: Secondary | ICD-10-CM

## 2017-10-10 ENCOUNTER — Telehealth: Payer: Self-pay | Admitting: *Deleted

## 2017-10-10 ENCOUNTER — Telehealth: Payer: Self-pay | Admitting: Gynecologic Oncology

## 2017-10-10 ENCOUNTER — Encounter: Payer: Self-pay | Admitting: Gynecologic Oncology

## 2017-10-10 NOTE — Telephone Encounter (Signed)
Left message to return call to [facility] at [phone]  in regards to referral placed for Dr. Denman George.

## 2017-10-10 NOTE — Telephone Encounter (Signed)
Appt has been scheduled for the pt to see Dr. Denman George on 10/22 at 915am. Gay Filler from Dr. Elza Rafter office will notify the pt. Letter mailed.

## 2017-10-10 NOTE — Telephone Encounter (Signed)
Appointment with Dr Denman George scheduled for 10-21-17 at 10:15am.  Will need to arrive at 945 for registration.

## 2017-10-15 NOTE — Telephone Encounter (Signed)
Spoke with patient, called to verify patient aware of appointment with Dr. Denman George as seen below. Patient states she did receive letter from [facility] with scheduling details. Patient verbalizes understanding and is agreeable.  Routing to provider for final review. Patient is agreeable to disposition. Will close encounter.  Cc: Magdalene Patricia

## 2017-10-16 ENCOUNTER — Encounter: Payer: Self-pay | Admitting: Family Medicine

## 2017-10-16 ENCOUNTER — Ambulatory Visit (INDEPENDENT_AMBULATORY_CARE_PROVIDER_SITE_OTHER): Payer: 59 | Admitting: Family Medicine

## 2017-10-16 VITALS — BP 150/82 | HR 66 | Temp 98.1°F | Resp 16 | Ht 64.0 in | Wt 186.0 lb

## 2017-10-16 DIAGNOSIS — I10 Essential (primary) hypertension: Secondary | ICD-10-CM | POA: Diagnosis not present

## 2017-10-16 DIAGNOSIS — R7303 Prediabetes: Secondary | ICD-10-CM | POA: Insufficient documentation

## 2017-10-16 DIAGNOSIS — E7439 Other disorders of intestinal carbohydrate absorption: Secondary | ICD-10-CM

## 2017-10-16 MED ORDER — HYDRALAZINE HCL 25 MG PO TABS: 25.0000 mg | ORAL_TABLET | Freq: Two times a day (BID) | ORAL | 2 refills | Status: DC

## 2017-10-16 MED ORDER — CLINDAMYCIN PALMITATE HCL 75 MG/5ML PO SOLR: ORAL | 0 refills | Status: DC

## 2017-10-16 NOTE — Progress Notes (Signed)
   Subjective:    Patient ID: Melanie Little, female    DOB: October 28, 1971, 46 y.o.   MRN: [id number]  Patient presents for Follow-up (HTN)  She here to follow-up her blood pressure. Her blood pressure has been elevated for the past few weeks has been up to 200s over 100 especially when she is up and moving around When sheis  rested and takes it is often down to 140 or 150s. She's not had any chest pain  palpitation she does have occasional dizziness but only last for a few seconds. On one instance she did have some discomfort into her left arm but that resolved quickly. He has been taking losartan and was recently placed on bystolic 01EO  She does have sleep apnea She also has mild glucose intolerance or last A1c was 6.2%  Was on phentermine for weight loss but discontinued this when her blood pressure started to elevate  She is currently being evaluated by oncology and she has ovarian cyst and an elevated CA 125 level  Review Of Systems:  GEN- denies fatigue, fever, weight loss,weakness, recent illness HEENT- denies eye drainage, change in vision, nasal discharge, CVS- denies chest pain, palpitations RESP- denies SOB, cough, wheeze ABD- denies N/V, change in stools, abd pain GU- denies dysuria, hematuria, dribbling, incontinence MSK- denies joint pain, muscle aches, injury Neuro- denies headache, dizziness, syncope, seizure activity       Objective:    BP (!) 150/82 (BP Location: Right Arm, Patient Position: Sitting)   Pulse 66   Temp 98.1 F (36.7 C) (Oral)   Resp 16   Ht 5' 4"  (1.626 m)   Wt 186 lb (84.4 kg)   SpO2 99%   BMI 31.93 kg/m  GEN- NAD, alert and oriented x3  HEENT- PERRL, EOMI, non injected sclera, pink conjunctiva, MMM, oropharynx clear Neck- Supple, no thyromegaly CVS- RRR, no murmur RESP-CTAB EXT- No edema Pulses- Radial, DP- 2+   EKG- NSR. Poor r wave progression      Assessment & Plan:      Problem List Items Addressed This Visit      Unprioritized   Glucose intolerance   Relevant Orders   Hemoglobin A1c   Essential hypertension - Primary    Uncontrolled has appt with cardiology next month Due to previous reactions with norvasc, HCTZ  Heart rate borderline 60's with recently added bystolic Will add Hydralazine 85m BID for now  Also check renal function, A1C  Low salt diet,       Relevant Medications   hydrALAZINE (APRESOLINE) 25 MG tablet   Other Relevant Orders   EKG 12-Lead (Completed)   CBC with Differential/Platelet   Comprehensive metabolic panel      Note: This dictation was prepared with Dragon dictation along with smaller phrase technology. Any transcriptional errors that result from this process are unintentional.

## 2017-10-16 NOTE — Patient Instructions (Addendum)
Take hydralazine and bystolic in the morning Take losartan and hydralazine in the evening  Cancel appt for Nov  F/u PENDING CARDIOLOGY

## 2017-10-17 ENCOUNTER — Encounter: Payer: Self-pay | Admitting: Family Medicine

## 2017-10-17 NOTE — Assessment & Plan Note (Signed)
Uncontrolled has appt with cardiology next month Due to previous reactions with norvasc, HCTZ  Heart rate borderline 60's with recently added bystolic Will add Hydralazine 60m BID for now  Also check renal function, A1C  Low salt diet,

## 2017-10-18 ENCOUNTER — Encounter: Payer: Self-pay | Admitting: Cardiovascular Disease

## 2017-10-21 ENCOUNTER — Encounter: Payer: Self-pay | Admitting: Gynecologic Oncology

## 2017-10-21 ENCOUNTER — Ambulatory Visit: Payer: 59 | Attending: Gynecologic Oncology | Admitting: Gynecologic Oncology

## 2017-10-21 VITALS — BP 161/80 | HR 71 | Temp 98.2°F | Resp 18 | Wt 184.6 lb

## 2017-10-21 DIAGNOSIS — Z8489 Family history of other specified conditions: Secondary | ICD-10-CM | POA: Insufficient documentation

## 2017-10-21 DIAGNOSIS — N83292 Other ovarian cyst, left side: Secondary | ICD-10-CM | POA: Diagnosis not present

## 2017-10-21 DIAGNOSIS — N83202 Unspecified ovarian cyst, left side: Secondary | ICD-10-CM

## 2017-10-21 DIAGNOSIS — R971 Elevated cancer antigen 125 [CA 125]: Secondary | ICD-10-CM | POA: Insufficient documentation

## 2017-10-21 DIAGNOSIS — I10 Essential (primary) hypertension: Secondary | ICD-10-CM | POA: Insufficient documentation

## 2017-10-21 DIAGNOSIS — N938 Other specified abnormal uterine and vaginal bleeding: Secondary | ICD-10-CM | POA: Insufficient documentation

## 2017-10-21 DIAGNOSIS — Z9889 Other specified postprocedural states: Secondary | ICD-10-CM | POA: Insufficient documentation

## 2017-10-21 DIAGNOSIS — D259 Leiomyoma of uterus, unspecified: Secondary | ICD-10-CM | POA: Diagnosis not present

## 2017-10-21 DIAGNOSIS — Z833 Family history of diabetes mellitus: Secondary | ICD-10-CM | POA: Diagnosis not present

## 2017-10-21 DIAGNOSIS — Z79899 Other long term (current) drug therapy: Secondary | ICD-10-CM | POA: Diagnosis not present

## 2017-10-21 DIAGNOSIS — Z803 Family history of malignant neoplasm of breast: Secondary | ICD-10-CM | POA: Insufficient documentation

## 2017-10-21 DIAGNOSIS — Z823 Family history of stroke: Secondary | ICD-10-CM | POA: Insufficient documentation

## 2017-10-21 DIAGNOSIS — Z9049 Acquired absence of other specified parts of digestive tract: Secondary | ICD-10-CM | POA: Insufficient documentation

## 2017-10-22 ENCOUNTER — Emergency Department (HOSPITAL_COMMUNITY)
Admission: EM | Admit: 2017-10-22 | Discharge: 2017-10-23 | Disposition: A | Discharge status: Home / Self Care | Payer: 59 | Attending: Emergency Medicine | Admitting: Emergency Medicine

## 2017-10-22 ENCOUNTER — Encounter: Payer: Self-pay | Admitting: Gynecologic Oncology

## 2017-10-22 ENCOUNTER — Emergency Department (HOSPITAL_COMMUNITY): Payer: 59

## 2017-10-22 ENCOUNTER — Encounter (HOSPITAL_COMMUNITY): Payer: Self-pay

## 2017-10-22 ENCOUNTER — Other Ambulatory Visit: Payer: Self-pay

## 2017-10-22 DIAGNOSIS — R0789 Other chest pain: Secondary | ICD-10-CM | POA: Insufficient documentation

## 2017-10-22 DIAGNOSIS — R51 Headache: Secondary | ICD-10-CM | POA: Diagnosis not present

## 2017-10-22 DIAGNOSIS — I10 Essential (primary) hypertension: Secondary | ICD-10-CM | POA: Diagnosis not present

## 2017-10-22 DIAGNOSIS — R079 Chest pain, unspecified: Secondary | ICD-10-CM | POA: Diagnosis not present

## 2017-10-22 DIAGNOSIS — Z79899 Other long term (current) drug therapy: Secondary | ICD-10-CM | POA: Diagnosis not present

## 2017-10-22 LAB — BASIC METABOLIC PANEL
ANION GAP: 9 (ref 5–15)
BUN: 7 mg/dL (ref 6–20)
CALCIUM: 8.6 mg/dL — AB (ref 8.9–10.3)
CHLORIDE: 106 mmol/L (ref 101–111)
CO2: 26 mmol/L (ref 22–32)
CREATININE: 0.67 mg/dL (ref 0.44–1.00)
GFR calc Af Amer: >60 mL/min (ref >60)
GLUCOSE: 121 mg/dL — AB (ref 65–99)
POTASSIUM: 3.2 mmol/L — AB (ref 3.5–5.1)
Sodium: 141 mmol/L (ref 135–145)

## 2017-10-22 LAB — CBC
HCT: 35.9 % — ABNORMAL LOW (ref 36.0–46.0)
HEMOGLOBIN: 11.9 g/dL — AB (ref 12.0–15.0)
MCH: 22 pg — AB (ref 26.0–34.0)
MCHC: 33.1 g/dL (ref 30.0–36.0)
MCV: 66.4 fL — ABNORMAL LOW (ref 78.0–100.0)
PLATELETS: 353 10*3/uL (ref 150–400)
RBC: 5.41 MIL/uL — AB (ref 3.87–5.11)
RDW: 14.8 % (ref 11.5–15.5)
WBC: 9 10*3/uL (ref 4.0–10.5)

## 2017-10-22 LAB — I-STAT TROPONIN, ED: TROPONIN I, POC: 0 ng/mL (ref 0.00–0.08)

## 2017-10-22 NOTE — Progress Notes (Signed)
Consult Note: Gyn-Onc  Consult was requested by Dr. Quincy Simmonds for the evaluation of Melanie Little 46 y.o. female  CC:  Chief Complaint  Patient presents with  . Elevated CA-125  . Cyst of left ovary    Assessment/Plan:  Melanie Little  is a 46 y.o.  year old woman with a left ovarian 5-6cm ovarian cyst. It has been present and fairly stable for many years, though her CA 125 has increased from 51 in 2016 to 47 in October, 2018.  I suspect that the mass is most likely benign, however, I do recommend surgical intervention to ensure this is the case.  The patient is interested in surgery, however, has just been diagnosed with poorly controlled hypertension. She has a cardiology consultation in November. We will see her back in December with a repeat US and CA 125. If the cyst is stable and the CA 125 has fallen, we can reconsider surgery. If her CA 125 continues to rise, and her BP is better controlled, she would be considered for a robotic LSO, possible stagin.   HPI: Melanie Little is a 46 year old woman who is seen in consultation at the request of Dr Quincy Simmonds for a 6cm ovarian cyst.   The patient has a history of uterine fibroids (minimally symptomatic). For this she was followed with serial ultrasounds since at least 2008. She has been known to have a 5-6cm left ovarian complex cyst. It has remained fairly stable on imaging over time. Most recently an [place] on 10/04/17 showed a 10x7.8x6.9cm uterus with a 47m endometrium. The left ovary measured 6.4x4.6x3.7cm with avascular smooth margins, it appears echofree, slight increase in size since 2017.  Her CA 125 was 59.4 (it had been 31 in 2016.  The patient is asymptomatic. She has hypertension and has recently experienced poor control of this and needed changes in meds. She will see cardiology in November, 2018.   Current Meds:  Outpatient Encounter Prescriptions as of 10/21/2017  Medication Sig  . chlorhexidine (PERIDEX) 0.12 % solution Rinse  with 1/2 ounce by mouth for 30 seconds then spit out. use twice a day  . hydrALAZINE (APRESOLINE) 25 MG tablet Take 1 tablet (25 mg total) by mouth 2 (two) times daily.  .Marland Kitchenlosartan (COZAAR) 100 MG tablet Take 1 tablet (100 mg total) by mouth daily.  . nebivolol (BYSTOLIC) 10 MG tablet Take 1 tablet (10 mg total) by mouth daily.   No facility-administered encounter medications on file as of 10/21/2017.     Allergy: No Known Allergies  Social Hx:   Social History   Social History  . Marital status: Married    Spouse name: N/A  . Number of children: N/A  . Years of education: N/A   Occupational History  . [employer]    Social History Main Topics  . Smoking status: Never Smoker  . Smokeless tobacco: Never Used  . Alcohol use No  . Drug use: No  . Sexual activity: Yes    Birth control/ protection: Other-see comments     Comment: OTC vaginal pill   Other Topics Concern  . Not on file   Social History Narrative   Work or School: SEducation officer, [employer]      Home Situation: lives with husband      Spiritual Beliefs: Baptist      Lifestyle: walks every other day; diet is bad          Past Surgical  Hx:  Past Surgical History:  Procedure Laterality Date  . BREAST REDUCTION SURGERY  2005  . CHOLECYSTECTOMY  2004  . INCISION AND DRAINAGE Bilateral 2015   Bilateral axillary region ebaceous cyst    Past Medical Hx:  Past Medical History:  Diagnosis Date  . Abnormal Pap smear of cervix about 2013, 2016   Colpo biopsy with normal result per pt.  colpo 2016 HPV changes  . DUB (dysfunctional uterine bleeding)    around 2012, saw gyn at the time for eval  . Fibroid    multiple fibroids  . Hypertension   . Left ovarian cyst    do yearly ultrasound    Past Gynecological History:  Fibroids. Left ovarian cyst No LMP recorded.  Family Hx:  Family History  Problem Relation Age of Onset  . CVA Mother 58        deceased, was 82 at the age of her first stroke  . Diabetes Father   . Deep vein thrombosis Father   . Lupus Sister        questionable diagnosis per patient  . Breast cancer Maternal Aunt   . Breast cancer Maternal Aunt     Review of Systems:  Constitutional  Feels well,    ENT Normal appearing ears and nares bilaterally Skin/Breast  No rash, sores, jaundice, itching, dryness Cardiovascular  No chest pain, shortness of breath, or edema  Pulmonary  No cough or wheeze.  Gastro Intestinal  No nausea, vomitting, or diarrhoea. No bright red blood per rectum, no abdominal pain, change in bowel movement, or constipation.  Genito Urinary  No frequency, urgency, dysuria, menorrhagia Musculo Skeletal  No myalgia, arthralgia, joint swelling or pain  Neurologic  No weakness, numbness, change in gait,  Psychology  No depression, anxiety, insomnia.   Vitals:  Blood pressure (!) 161/80, pulse 71, temperature 98.2 F (36.8 C), temperature source Oral, resp. rate 18, weight 184 lb 9.6 oz (83.7 kg), SpO2 100 %.  Physical Exam: WD in NAD Neck  Supple NROM, without any enlargements.  Lymph Node Survey No cervical supraclavicular or inguinal adenopathy Cardiovascular  Pulse normal rate, regularity and rhythm. S1 and S2 normal.  Lungs  Clear to auscultation bilateraly, without wheezes/crackles/rhonchi. Good air movement.  Skin  No rash/lesions/breakdown  Psychiatry  Alert and oriented to person, place, and time  Abdomen  Normoactive bowel sounds, abdomen soft, non-tender and obese without evidence of hernia.  Back No CVA tenderness Genito Urinary  Vulva/vagina: Normal external female genitalia.   No lesions. No discharge or bleeding.  Bladder/urethra:  No lesions or masses, well supported bladder  Vagina: normal  Cervix: Normal appearing, no lesions.  Uterus: globular,slightly enlarged, mobile, no parametrial involvement or nodularity.  Adnexa: no discrete masses. Rectal   deferred Extremities  No bilateral cyanosis, clubbing or edema.   Donaciano Eva, MD  10/22/2017, 5:20 PM

## 2017-10-22 NOTE — Progress Notes (Signed)
Thank you for the update!

## 2017-10-22 NOTE — ED Triage Notes (Signed)
Pt reports hx of HTN. Over the last three weeks she was seen at PCP multiple times and placed on 2 new blood pressure medications. Pt states she was placed on hydralazine last week BID, but has only been able to take daily d/t increased chest pressure when taking it. Pt reports despite taking 3 medications she is remaining hypertensive with chest pressure, dyspnea on exertion, and headaches.

## 2017-10-23 LAB — COMPREHENSIVE METABOLIC PANEL
AG Ratio: 1.7 (calc) (ref 1.0–2.5)
ALBUMIN MSPROF: 4.2 g/dL (ref 3.6–5.1)
ALKALINE PHOSPHATASE (APISO): 81 U/L (ref 33–115)
ALT: 14 U/L (ref 6–29)
AST: 13 U/L (ref 10–30)
BILIRUBIN TOTAL: 0.9 mg/dL (ref 0.2–1.2)
BUN: 12 mg/dL (ref 7–25)
CALCIUM: 9.1 mg/dL (ref 8.6–10.2)
CHLORIDE: 105 mmol/L (ref 98–110)
CO2: 29 mmol/L (ref 20–32)
Creat: 0.73 mg/dL (ref 0.50–1.10)
GLOBULIN: 2.5 g/dL (ref 1.9–3.7)
Glucose, Bld: 109 mg/dL (ref 65–139)
POTASSIUM: 3.8 mmol/L (ref 3.5–5.3)
Sodium: 140 mmol/L (ref 135–146)
Total Protein: 6.7 g/dL (ref 6.1–8.1)

## 2017-10-23 LAB — CBC WITH DIFFERENTIAL/PLATELET
Basophils Absolute: 76 cells/uL (ref 0–200)
Basophils Relative: 0.8 %
EOS ABS: 171 {cells}/uL (ref 15–500)
Eosinophils Relative: 1.8 %
HEMATOCRIT: 37.9 % (ref 35.0–45.0)
Hemoglobin: 12.3 g/dL (ref 11.7–15.5)
LYMPHS ABS: 2765 {cells}/uL (ref 850–3900)
MCH: 22 pg — ABNORMAL LOW (ref 27.0–33.0)
MCHC: 32.5 g/dL (ref 32.0–36.0)
MCV: 67.8 fL — AB (ref 80.0–100.0)
MONOS PCT: 7.1 %
MPV: 9.9 fL (ref 7.5–12.5)
NEUTROS ABS: 5814 {cells}/uL (ref 1500–7800)
Neutrophils Relative %: 61.2 %
Platelets: 410 10*3/uL — ABNORMAL HIGH (ref 140–400)
RBC: 5.59 10*6/uL — ABNORMAL HIGH (ref 3.80–5.10)
RDW: 15.6 % — AB (ref 11.0–15.0)
Total Lymphocyte: 29.1 %
WBC: 9.5 10*3/uL (ref 3.8–10.8)
WBCMIX: 675 {cells}/uL (ref 200–950)

## 2017-10-23 LAB — HEMOGLOBIN A1C
HEMOGLOBIN A1C: 6 %{Hb} — AB (ref <5.7)
Mean Plasma Glucose: 126 (calc)
eAG (mmol/L): 7 (calc)

## 2017-10-23 NOTE — ED Notes (Signed)
Patient Alert and oriented X4. Stable and ambulatory. Patient verbalized understanding of the discharge instructions.  Patient belongings were taken by the patient.  

## 2017-10-23 NOTE — Discharge Instructions (Signed)
Stick to your home medications for now. If you find the full tablet of hydralizine is too much given the side effects, can try taking half a time. Watch you salt intake, can try some light exercise.  Make sure to get enough rest and monitor your stress. Check your home blood pressure no more than 2x daily. Follow-up with your primary care doctor. Return to the ED for new or worsening symptoms.

## 2017-10-23 NOTE — ED Provider Notes (Signed)
[facility] EMERGENCY DEPARTMENT Provider Note   CSN: [id number] Arrival date & time: 10/22/17  1828     History   Chief Complaint Chief Complaint  Patient presents with  . Hypertension  . Chest Pain    HPI Melanie Little is a 46 y.o. female.  The history is provided by the patient and medical records.     46 year old female with history of fibroids, hypertension, ovarian cysts, presenting to the ED with elevated blood pressure. States she has seen her primary care doctor at least 3 times over the past few weeks due to her persistently elevated blood pressure. She has been on losartan for a while now, but was recently started on bytolic (2 weeks ago) and hydralazine (about a week ago).  States with the hydralazine she has noticed some chest tightness so she reduced from twice daily to once daily and it improved somewhat.  She does report some ongoing headaches but notes it is because her blood pressure has been running high. She reports her diet has been better controlled, limited salt intake. She was taking phentermine, but stopped this a few weeks ago as well. She denies any excessive caffeine intake.She has no known cardiac history. She is scheduled to see a cardiologist in November for further testing. She has had some adverse reactions to other blood pressure medications in the past and is hesitant to start taking anything new.she does report checking her blood pressure multiple times during the day, not always at the same time.  Past Medical History:  Diagnosis Date  . Abnormal Pap smear of cervix about 2013, 2016   Colpo biopsy with normal result per pt.  colpo 2016 HPV changes  . DUB (dysfunctional uterine bleeding)    around 2012, saw gyn at the time for eval  . Fibroid    multiple fibroids  . Hypertension   . Left ovarian cyst    do yearly ultrasound    Patient Active Problem List   Diagnosis Date Noted  . Elevated CA-125 10/21/2017  . Glucose  intolerance 10/16/2017  . Epidermal cyst 07/30/2017  . OSA (obstructive sleep apnea) 04/30/2017  . Chronic hypokalemia 04/30/2017  . Obese 04/30/2017  . Subcutaneous nodule 12/03/2016  . Fibroid, uterine 08/19/2016  . History of abnormal cervical Pap smear 08/19/2016  . Snoring 10/13/2015  . Hypersomnia with sleep apnea 10/13/2015  . Menorrhagia with regular cycle 09/19/2015  . Left ovarian cyst 09/19/2015  . Essential hypertension 06/08/2015    Past Surgical History:  Procedure Laterality Date  . BREAST REDUCTION SURGERY  2005  . CHOLECYSTECTOMY  2004  . INCISION AND DRAINAGE Bilateral 2015   Bilateral axillary region ebaceous cyst    OB History    Gravida Para Term Preterm AB Living   4 1 1  0 3 1   SAB TAB Ectopic Multiple Live Births   0 3 0 0 1       Home Medications    Prior to Admission medications   Medication Sig Start Date End Date Taking? Authorizing Provider  chlorhexidine (PERIDEX) 0.12 % solution Rinse with 1/2 ounce by mouth for 30 seconds then spit out. use twice a day 10/27/15  Yes [provider]  hydrALAZINE (APRESOLINE) 25 MG tablet Take 1 tablet (25 mg total) by mouth 2 (two) times daily. 10/16/17  Yes Burt, Modena Nunnery, MD  losartan (COZAAR) 100 MG tablet Take 1 tablet (100 mg total) by mouth daily. 07/17/17  Yes Nolic, Modena Nunnery, MD  nebivolol (BYSTOLIC) 10 MG tablet Take 1 tablet (10 mg total) by mouth daily. 10/02/17  Yes Orlena Sheldon, PA-C    Family History Family History  Problem Relation Age of Onset  . CVA Mother 83       deceased, was 15 at the age of her first stroke  . Diabetes Father   . Deep vein thrombosis Father   . Lupus Sister        questionable diagnosis per patient  . Breast cancer Maternal Aunt   . Breast cancer Maternal Aunt     Social History Social History  Substance Use Topics  . Smoking status: Never Smoker  . Smokeless tobacco: Never Used  . Alcohol use No     Allergies   Patient has no known  allergies.   Review of Systems Review of Systems  Respiratory: Positive for chest tightness.   Neurological: Positive for headaches.  All other systems reviewed and are negative.    Physical Exam Updated Vital Signs BP (!) 164/95   Pulse 65   Temp 98.2 F (36.8 C) (Oral)   Resp 19   Ht 5' 4"  (1.626 m)   Wt 83 kg (183 lb)   LMP 10/15/2017   SpO2 98%   BMI 31.41 kg/m   Physical Exam  Constitutional: She is oriented to person, place, and time. She appears well-developed and well-nourished. No distress.  HENT:  Head: Normocephalic and atraumatic.  Right Ear: External ear normal.  Left Ear: External ear normal.  Mouth/Throat: Oropharynx is clear and moist.  Eyes: Pupils are equal, round, and reactive to light. Conjunctivae and EOM are normal.  Neck: Normal range of motion and full passive range of motion without pain. Neck supple. No neck rigidity.  No rigidity, no meningismus  Cardiovascular: Normal rate, regular rhythm and normal heart sounds.   No murmur heard. Pulmonary/Chest: Effort normal and breath sounds normal. No respiratory distress. She has no wheezes. She has no rhonchi.  Abdominal: Soft. Bowel sounds are normal. There is no tenderness. There is no guarding.  Musculoskeletal: Normal range of motion. She exhibits no edema.  Neurological: She is alert and oriented to person, place, and time. She has normal strength. She displays no tremor. No cranial nerve deficit or sensory deficit. She displays no seizure activity.  AAOx3, answering questions and following commands appropriately; equal strength UE and LE bilaterally; CN grossly intact; moves all extremities appropriately without ataxia; no focal neuro deficits or facial asymmetry appreciated  Skin: Skin is warm and dry. No rash noted. She is not diaphoretic.  Psychiatric: She has a normal mood and affect. Her behavior is normal. Thought content normal.  Nursing note and vitals reviewed.    ED Treatments /  Results  Labs (all labs ordered are listed, but only abnormal results are displayed) Labs Reviewed  BASIC METABOLIC PANEL - Abnormal; Notable for the following:       Result Value   Potassium 3.2 (*)    Glucose, Bld 121 (*)    Calcium 8.6 (*)    All other components within normal limits  CBC - Abnormal; Notable for the following:    RBC 5.41 (*)    Hemoglobin 11.9 (*)    HCT 35.9 (*)    MCV 66.4 (*)    MCH 22.0 (*)    All other components within normal limits  I-STAT TROPONIN, ED    EKG  EKG Interpretation None       Radiology Dg Chest 2 View  Result Date: 10/22/2017 CLINICAL DATA:  Chest pain EXAM: CHEST  2 VIEW COMPARISON:  None. FINDINGS: The heart size and mediastinal contours are within normal limits. Both lungs are clear. The visualized skeletal structures are unremarkable. IMPRESSION: No active cardiopulmonary disease. Electronically Signed   By: Donavan Foil M.D.   On: 10/22/2017 20:05    Procedures Procedures (including critical care time)  Medications Ordered in ED Medications - No data to display   Initial Impression / Assessment and Plan / ED Course  I have reviewed the triage vital signs and the nursing notes.  Pertinent labs & imaging results that were available during my care of the patient were reviewed by me and considered in my medical decision making (see chart for details).  46 year old female here with ongoing hypertension and chest tightness. States she noticed the chest tightness when she started taking hydralazine prescribed by her primary care doctor.  She is afebrile and nontoxic. Neurologic exam here is non-focal. She has not had any recent stroke like symptoms.  EKG here is nonischemic. Labwork is overall reassuring. Chest x-ray is clear. I do not appreciate any signs of end organ damage here. Patient's blood pressure 164/95 during assessment. I had a very lengthy conversation with patient and her family members about her ongoing elevated  blood pressure. She is somewhat frustrated that even after several weeks her blood pressure still remains elevated. She has had adverse reactions to other medications in the past and is hesitant to start something new which is reasonable. I recommended to try taking her blood pressure no more than  twice daily, preferably at the same time per day. Light exercise, diet modification with limited salt intake. Her son did inquire about more natural remedies-- discussed red rice yeast, cinnamon, etc.  Close follow-up with PCP.  Discussed plan with patient, she acknowledged understanding and agreed with plan of care.  Return precautions given for new or worsening symptoms.  Final Clinical Impressions(s) / ED Diagnoses   Final diagnoses:  Chest pain, unspecified type  Essential hypertension    New Prescriptions Discharge Medication List as of 10/23/2017 12:17 AM       Larene Pickett, PA-C 10/23/17 0140    Merryl Hacker, MD 10/23/17 [phone]

## 2017-10-24 ENCOUNTER — Telehealth: Payer: Self-pay | Admitting: *Deleted

## 2017-10-24 ENCOUNTER — Ambulatory Visit (INDEPENDENT_AMBULATORY_CARE_PROVIDER_SITE_OTHER): Payer: 59 | Admitting: Cardiovascular Disease

## 2017-10-24 ENCOUNTER — Encounter: Payer: Self-pay | Admitting: Cardiovascular Disease

## 2017-10-24 VITALS — BP 158/100 | HR 71 | Ht 64.0 in | Wt 189.0 lb

## 2017-10-24 DIAGNOSIS — I10 Essential (primary) hypertension: Secondary | ICD-10-CM | POA: Diagnosis not present

## 2017-10-24 DIAGNOSIS — R072 Precordial pain: Secondary | ICD-10-CM

## 2017-10-24 MED ORDER — SPIRONOLACTONE 25 MG PO TABS: 25.0000 mg | ORAL_TABLET | Freq: Every day | ORAL | 3 refills | Status: DC

## 2017-10-24 MED ORDER — CARVEDILOL 12.5 MG PO TABS: 12.5000 mg | ORAL_TABLET | Freq: Two times a day (BID) | ORAL | 3 refills | Status: DC

## 2017-10-24 NOTE — Telephone Encounter (Signed)
Received fax with pathology results.   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   MD reviewed and recommendations are as follows:  No cancer seen in the cyst.   Faxed results to Everitt Amber, MD @ [phone]~ telephone.   Call placed to patient. [place].

## 2017-10-24 NOTE — Telephone Encounter (Signed)
Patient returned call and made aware.

## 2017-10-24 NOTE — Patient Instructions (Addendum)
Medication Instructions:  Your physician has recommended you make the following change in your medication:  Stop Bystolic. Stop Hydralazine. Start Coreg 12.5 mg by mouth twice daily. Start Aldactone 25 mg by mouth daily.    Labwork: Your physician recommends that you return for lab work in: 7-10 day.  (BMP)  Testing/Procedures: Your physician has requested that you have a renal artery duplex. During this test, an ultrasound is used to evaluate blood flow to the kidneys. Allow one hour for this exam. Do not eat after midnight the day before and avoid carbonated beverages. Take your medications as you usually do.  Your physician has requested that you have an echocardiogram. Echocardiography is a painless test that uses sound waves to create images of your heart. It provides your doctor with information about the size and shape of your heart and how well your heart's chambers and valves are working. This procedure takes approximately one hour. There are no restrictions for this procedure.    Follow-Up: Your physician recommends that you schedule a follow-up appointment in: about 4 weeks.  Scheduled for 11/28/17 at 3:20    Any Other Special Instructions Will Be Listed Below (If Applicable).     If you need a refill on your cardiac medications before your next appointment, please call your pharmacy.

## 2017-10-24 NOTE — Progress Notes (Signed)
Chief Complaint  Patient presents with  . New Patient (Initial Visit)    HTN   History of Present Illness:46 yo female with history of HTN and sleep apnea who is here today as a new consult, referred by Dr. Buelah Manis, for evaluation of chest pain and HTN. She has recently been on phentermine for weight loss but this has been stopped. She has an ovarian cyst and elevated CA125 level and is being followed in oncology. She has had recent adjustment of her BP medications in the primary care office. She has not tolerated Norvasc or HCTZ in the past due to chest pain and swelling. She has been on Cozaar 100 mg po daily and Bystolic 10 mg daily. Hydralazine was recently added. She was seen in the ED this week with chest tightness but no evidence of ischemia or ACS. She tells me that she has had HTN for 10 years. Her BP had been controlled on Diovan alone for years. She was changed to Cozaar after the recall for Diovan. She also started phentermine for weight loss several months ago but this was stopped recently. She wears CPAP at night for sleep apnea. She has no LE edema.   Primary Care Physician: Alycia Rossetti, MD  Past Medical History:  Diagnosis Date  . Abnormal Pap smear of cervix about 2013, 2016   Colpo biopsy with normal result per pt.  colpo 2016 HPV changes  . DUB (dysfunctional uterine bleeding)    around 2012, saw gyn at the time for eval  . Fibroid    multiple fibroids  . Hypertension   . Left ovarian cyst    do yearly ultrasound    Past Surgical History:  Procedure Laterality Date  . BREAST REDUCTION SURGERY  2005  . CHOLECYSTECTOMY  2004  . INCISION AND DRAINAGE Bilateral 2015   Bilateral axillary region ebaceous cyst    Current Outpatient Prescriptions  Medication Sig Dispense Refill  . chlorhexidine (PERIDEX) 0.12 % solution Rinse with 1/2 ounce by mouth for 30 seconds then spit out. use twice a day  0  . losartan (COZAAR) 100 MG tablet Take 1 tablet (100 mg total) by  mouth daily. 90 tablet 3  . carvedilol (COREG) 12.5 MG tablet Take 1 tablet (12.5 mg total) by mouth 2 (two) times daily. 60 tablet 3  . spironolactone (ALDACTONE) 25 MG tablet Take 1 tablet (25 mg total) by mouth daily. 30 tablet 3   No current facility-administered medications for this visit.     No Known Allergies  Social History   Social History  . Marital status: Married    Spouse name: N/A  . Number of children: 1  . Years of education: N/A   Occupational History  . [employer]    Social History Main Topics  . Smoking status: Never Smoker  . Smokeless tobacco: Never Used  . Alcohol use No  . Drug use: No  . Sexual activity: Yes    Birth control/ protection: Other-see comments     Comment: OTC vaginal pill   Other Topics Concern  . Not on file   Social History Narrative   Work or School: Education officer, 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[employer]      Home Situation: lives with husband      Spiritual Beliefs: [employer]      Lifestyle: walks every other day; diet is bad          Family History  Problem Relation Age of  Onset  . CVA Mother 86       deceased, was 68 at the age of her first stroke  . Diabetes Father   . Deep vein thrombosis Father   . Lupus Sister        questionable diagnosis per patient  . Breast cancer Maternal Aunt   . Breast cancer Maternal Aunt     Review of Systems:  As stated in the HPI and otherwise negative.   BP (!) 158/100   Pulse 71   Ht 5' 4"  (1.626 m)   Wt 189 lb (85.7 kg)   LMP 10/15/2017   SpO2 98%   BMI 32.44 kg/m   Physical Examination: General: Well developed, well nourished, NAD  HEENT: OP clear, mucus membranes moist  SKIN: warm, dry. No rashes. Neuro: No focal deficits  Musculoskeletal: Muscle strength 5/5 all ext  Psychiatric: Mood and affect normal  Neck: No JVD, no carotid bruits, no thyromegaly, no lymphadenopathy.  Lungs:Clear bilaterally, no wheezes, rhonci,  crackles Cardiovascular: Regular rate and rhythm. No murmurs, gallops or rubs. Abdomen:Soft. Bowel sounds present. Non-tender.  Extremities: No lower extremity edema. Pulses are 2 + in the bilateral DP/PT.  EKG:  EKG is not ordered today. The ekg from 10/22/17 is reviewed today and shows sinus  Recent Labs: 12/03/2016: TSH 2.09 04/30/2017: Magnesium 2.0 10/21/2017: ALT 14 10/22/2017: BUN 7; Creatinine, Ser 0.67; Hemoglobin 11.9; Platelets 353; Potassium 3.2; Sodium 141   Lipid Panel    Component Value Date/Time   CHOL 198 12/03/2016 1233   TRIG 152.0 (H) 12/03/2016 1233   HDL 59.30 12/03/2016 1233   CHOLHDL 3 12/03/2016 1233   VLDL 30.4 12/03/2016 1233   LDLCALC 109 (H) 12/03/2016 1233     Wt Readings from Last 3 Encounters:  10/24/17 189 lb (85.7 kg)  10/22/17 183 lb (83 kg)  10/21/17 184 lb 9.6 oz (83.7 kg)     Other studies Reviewed: Additional studies/ records that were reviewed today include: . Review of the above records demonstrates:    Assessment and Plan:   1. Chest pain: Her pain is atypical. EKG reviewed today. No plans for ischemic evaluation.   2. HTN: BP is elevated today and has been poorly controlled at home. She reports not tolerating HCTZ or Norvasc in the past. Per guidelines, a diuretic and Calcium channel blocker would the first choices for management of her HTN. She is now on an ARB (cozaar), vasodilator (hydralazine) and a beta blocker (Bystolic). She wishes to stop her Bystolic due to cost. I will start Coreg 12.5 mg po BID She wishes to stop her hydralazine due to side effect of chest pressure after taking. I will stop hydralazine and start aldactone 25 mg po once daily.  I will arrange an echo to assess LV systolic function and renal artery dopplers to exclude renal artery stenosis. If testing is normal and she continues to have uncontrolled HTN, will need to consider secondary causes of HTN and testing for hyperaldosteronism in primary care. She has  a low potassium level. She has no symptoms c/w pheochromocytoma.   Current medicines are reviewed at length with the patient today.  The patient does not have concerns regarding medicines.  The following changes have been made:    Labs/ tests ordered today include:   Orders Placed This Encounter  Procedures  . Basic Metabolic Panel (BMET)  . ECHOCARDIOGRAM COMPLETE     Disposition:   FU with me in 4 weeks   Signed, Lauree Chandler,  MD 10/24/2017 1:11 PM    [address] [place] [address], [place], [place]  [address] Phone: [phone]; Fax: [phone]

## 2017-11-01 ENCOUNTER — Ambulatory Visit: Payer: 59 | Admitting: Family Medicine

## 2017-11-04 ENCOUNTER — Ambulatory Visit (HOSPITAL_COMMUNITY): Payer: 59 | Attending: Cardiology

## 2017-11-04 ENCOUNTER — Other Ambulatory Visit: Payer: Self-pay | Admitting: Family Medicine

## 2017-11-04 ENCOUNTER — Other Ambulatory Visit: Payer: Self-pay

## 2017-11-04 ENCOUNTER — Telehealth: Payer: Self-pay | Admitting: *Deleted

## 2017-11-04 ENCOUNTER — Other Ambulatory Visit: Payer: 59 | Admitting: *Deleted

## 2017-11-04 ENCOUNTER — Ambulatory Visit: Payer: 59 | Admitting: Cardiovascular Disease

## 2017-11-04 DIAGNOSIS — I10 Essential (primary) hypertension: Secondary | ICD-10-CM | POA: Insufficient documentation

## 2017-11-04 DIAGNOSIS — R072 Precordial pain: Secondary | ICD-10-CM

## 2017-11-04 DIAGNOSIS — G4730 Sleep apnea, unspecified: Secondary | ICD-10-CM | POA: Diagnosis not present

## 2017-11-04 DIAGNOSIS — I517 Cardiomegaly: Secondary | ICD-10-CM | POA: Diagnosis not present

## 2017-11-04 LAB — BASIC METABOLIC PANEL
BUN/Creatinine Ratio: 14 (ref 9–23)
BUN: 11 mg/dL (ref 6–24)
CALCIUM: 8.8 mg/dL (ref 8.7–10.2)
CO2: 22 mmol/L (ref 20–29)
CREATININE: 0.77 mg/dL (ref 0.57–1.00)
Chloride: 105 mmol/L (ref 96–106)
GFR calc Af Amer: 109 mL/min/{1.73_m2} (ref >59)
GFR, EST NON AFRICAN AMERICAN: 94 mL/min/{1.73_m2} (ref >59)
GLUCOSE: 98 mg/dL (ref 65–99)
POTASSIUM: 4.6 mmol/L (ref 3.5–5.2)
Sodium: 139 mmol/L (ref 134–144)

## 2017-11-04 NOTE — Telephone Encounter (Signed)
I spoke with pt and reviewed BMP and echo results with her. She reports BP has improved some but it still running high. Reports the following readings  11/5-174/108 11/4-159/100 11/3-179/119 11/2-180/118 11/1-168/99 10/31-169/107 10/30-170/106  All readings are 1-2 hours after taking AM medications. BP was 168/110 when here for echo today. Pt reports in the evening when she is lying down and ready for bed readings are better.  Reports reading of 144/96  I reviewed with Dr. Angelena Form and if heart rate above 60 can increase Coreg to 25 mg by mouth twice daily. I spoke with pt who states heart rate has been 62-67.  I gave her instructions to increase Coreg to 25 mg twice daily.  She does not want to make change now and wants to wait until after results of renal doppler are known. I asked her to continue to monitor BP and call us if she would like to make change.

## 2017-11-11 ENCOUNTER — Other Ambulatory Visit: Payer: Self-pay | Admitting: Family Medicine

## 2017-11-11 DIAGNOSIS — Z1231 Encounter for screening mammogram for malignant neoplasm of breast: Secondary | ICD-10-CM

## 2017-11-15 ENCOUNTER — Ambulatory Visit (HOSPITAL_COMMUNITY)
Admission: RE | Admit: 2017-11-15 | Discharge: 2017-11-15 | Disposition: A | Discharge status: Home / Self Care | Payer: 59 | Source: Ambulatory Visit | Attending: Cardiology | Admitting: Cardiology

## 2017-11-15 ENCOUNTER — Encounter: Payer: Self-pay | Admitting: Family Medicine

## 2017-11-15 DIAGNOSIS — I10 Essential (primary) hypertension: Secondary | ICD-10-CM | POA: Insufficient documentation

## 2017-11-15 DIAGNOSIS — I771 Stricture of artery: Secondary | ICD-10-CM | POA: Insufficient documentation

## 2017-11-20 ENCOUNTER — Encounter: Payer: Self-pay | Admitting: Family Medicine

## 2017-11-28 ENCOUNTER — Encounter: Payer: Self-pay | Admitting: Cardiovascular Disease

## 2017-11-28 ENCOUNTER — Ambulatory Visit: Payer: 59 | Admitting: Cardiovascular Disease

## 2017-11-28 ENCOUNTER — Telehealth: Payer: Self-pay

## 2017-11-28 VITALS — BP 156/90 | HR 81 | Ht 64.0 in | Wt 187.8 lb

## 2017-11-28 DIAGNOSIS — I10 Essential (primary) hypertension: Secondary | ICD-10-CM | POA: Diagnosis not present

## 2017-11-28 DIAGNOSIS — R072 Precordial pain: Secondary | ICD-10-CM

## 2017-11-28 NOTE — Progress Notes (Signed)
Chief Complaint  Patient presents with  . Follow-up    HTN   History of Present Illness:Melanie Little with history of HTN and sleep apnea who is here today for cardiac follow up. I saw her as a new consult in October 2018 for evaluation of chest pain and HTN. She had recently been on phentermine for weight loss but this has been stopped. She has an ovarian cyst and elevated CA125 level and is being followed in oncology. At her first visit, she told me that her BP meds had recently been adjusted in primary care. She has not tolerated Norvasc or HCTZ in the past due to chest pain and swelling. She had been on Cozaar 100 mg po daily and Bystolic 10 mg daily. Hydralazine was recently added. She has had elevated BP for 10 years but her BP had been well controlled on Diovan prior to the recall, at which time she changed to Cozaar. She also started phentermine for weight loss in August 2018 but this was stopped. She wears CPAP at night for sleep apnea. ED visit in October 2018 with chest pain but no evidence of ACS. At her first visit here, she wished to stop Bystolic due to cost and hydralazine as she felt it made her have chest pain. I stopped Bystolic and started Coreg 12.5 mg po BID and aldactone 25 mg daily. Hydralazine was stopped. Echo 11/04/17 with normal LV systolic function, moderate LVH, no valve disease. Renal artery dopplers 11/15/17 were normal.   She is here today for follow up. The patient denies any chest pain, dyspnea, palpitations, lower extremity edema, orthopnea, PND, dizziness, near syncope or syncope. She has been taking all meds as prescribed.    Primary Care Physician: Alycia Rossetti, MD  Past Medical History:  Diagnosis Date  . Abnormal Pap smear of cervix about 2013, 2016   Colpo biopsy with normal result per pt.  colpo 2016 HPV changes  . DUB (dysfunctional uterine bleeding)    around 2012, saw gyn at the time for eval  . Fibroid    multiple fibroids  . Hypertension   .  Left ovarian cyst    do yearly ultrasound    Past Surgical History:  Procedure Laterality Date  . BREAST REDUCTION SURGERY  2005  . CHOLECYSTECTOMY  2004  . INCISION AND DRAINAGE Bilateral 2015   Bilateral axillary region ebaceous cyst    Current Outpatient Medications  Medication Sig Dispense Refill  . carvedilol (COREG) 12.5 MG tablet Take 1 tablet (12.5 mg total) by mouth 2 (two) times daily. 60 tablet 3  . chlorhexidine (PERIDEX) 0.12 % solution Rinse with 1/2 ounce by mouth for 30 seconds then spit out. use twice a day  0  . losartan (COZAAR) 100 MG tablet Take 1 tablet (100 mg total) by mouth daily. 90 tablet 3  . spironolactone (ALDACTONE) 25 MG tablet Take 1 tablet (25 mg total) by mouth daily. 30 tablet 3   No current facility-administered medications for this visit.     No Known Allergies  Social History   Socioeconomic History  . Marital status: Married    Spouse name: Not on file  . Number of children: 1  . Years of education: Not on file  . Highest education level: Not on file  Social Needs  . Financial resource strain: Not on file  . Food insecurity - worry: Not on file  . Food insecurity - inability: Not on file  . Transportation needs -  medical: Not on file  . Transportation needs - non-medical: Not on file  Occupational History  . Occupation: [employer]  Tobacco Use  . Smoking status: Never Smoker  . Smokeless tobacco: Never Used  Substance and Sexual Activity  . Alcohol use: No    Alcohol/week: 0.0 oz  . Drug use: No  . Sexual activity: Yes    Birth control/protection: Other-see comments    Comment: OTC vaginal pill  Other Topics Concern  . Not on file  Social History Narrative   Work or School: Education officer, 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[employer]      Home Situation: lives with husband      Spiritual Beliefs: Baptist      Lifestyle: walks every other day; diet is bad       Family History  Problem Relation  Age of Onset  . CVA Mother 72       deceased, was 40 at the age of her first stroke  . Diabetes Father   . Deep vein thrombosis Father   . Lupus Sister        questionable diagnosis per patient  . Breast cancer Maternal Aunt   . Breast cancer Maternal Aunt     Review of Systems:  As stated in the HPI and otherwise negative.   BP (!) 156/90   Pulse 81   Ht 5' 4"  (1.626 m)   Wt 187 lb 12.8 oz (85.2 kg)   SpO2 96%   BMI 32.24 kg/m   Physical Examination:  General: Well developed, well nourished, NAD  HEENT: OP clear, mucus membranes moist  SKIN: warm, dry. No rashes. Neuro: No focal deficits  Musculoskeletal: Muscle strength 5/5 all ext  Psychiatric: Mood and affect normal  Neck: No JVD, no carotid bruits, no thyromegaly, no lymphadenopathy.  Lungs:Clear bilaterally, no wheezes, rhonci, crackles Cardiovascular: Irreg irreg. No murmurs, gallops or rubs. Abdomen:Soft. Bowel sounds present. Non-tender.  Extremities: No lower extremity edema. Pulses are 2 + in the bilateral DP/PT.  Echo 11/04/17: - Left ventricle: The cavity size was normal. There was moderate   concentric hypertrophy. Systolic function was normal. The   estimated ejection fraction was in the range of 55% to 60%. Wall   motion was normal; there were no regional wall motion   abnormalities. Left ventricular diastolic function parameters   were normal. - Pulmonary arteries: Systolic pressure could not be accurately   estimated.  Renal artery dopplers 11/15/17: No evidence of renal artery stenosis, bilaterally. Tortuous renal arteries, bilaterally. Normal and symmetrical kidney size. Normal cortical thickness. No evidence of SMA or celiac artery stenosis. Patent renal veins and IVC.  EKG:  EKG is not ordered today.   Recent Labs: 12/03/2016: TSH 2.09 04/30/2017: Magnesium 2.0 10/21/2017: ALT 14 10/22/2017: Hemoglobin 11.9; Platelets 353 11/04/2017: BUN 11; Creatinine, Ser 0.77; Potassium 4.6; Sodium 139     Lipid Panel    Component Value Date/Time   CHOL 198 12/03/2016 1233   TRIG 152.0 (H) 12/03/2016 1233   HDL 59.30 12/03/2016 1233   CHOLHDL 3 12/03/2016 1233   VLDL 30.4 12/03/2016 1233   LDLCALC 109 (H) 12/03/2016 1233     Wt Readings from Last 3 Encounters:  11/28/17 187 lb 12.8 oz (85.2 kg)  10/24/17 189 lb (85.7 kg)  10/22/17 183 lb (83 kg)     Other studies Reviewed: Additional studies/ records that were reviewed today include: . Review of the above records demonstrates:    Assessment and Plan:  1. Chest pain: Her chest pain is atypical and there are no plans for ischemic evaluation. EKG from October 2018 is normal.  No recurrence of chest pain since last visit.   2. HTN: BP is better controlled on Coreg 12.5 mg po BID, aldactone 25 mg daily and Cozaar 100 mg daily. Her log of readings from home still in range of 403-474 systolic. Echo  11/04/17 with LVH but normal LV systolic and diastolic function with no valve disease. Renal artery dopplers normal 11/15/17. I have recommended increasing her Coreg to 25 mg BID but she wishes to wait on this change.  I will forward this information to Dr. Buelah Manis. I will recommend consideration of testing for secondary causes of HTN including pheochromocytoma and primary hyperaldosteronism, to be performed in Dr. Dorian Heckle office. I suspect that this represent essential hypertension. I have recommended diet and weight loss, as this will help with her BP control as well.   I will see her back as needed.    Current medicines are reviewed at length with the patient today.  The patient does not have concerns regarding medicines.  The following changes have been made:    Labs/ tests ordered today include:   No orders of the defined types were placed in this encounter.    Disposition:   FU with me as needed.    Signed, Lauree Chandler, MD 11/28/2017 3:55 PM    [address], [place], [place]   [address] Phone: [phone]; Fax: [phone]

## 2017-11-28 NOTE — Patient Instructions (Signed)
Medication Instructions:  Your physician recommends that you continue on your current medications as directed. Please refer to the Current Medication list given to you today.   Labwork: none  Testing/Procedures: none  Follow-Up: Your physician recommends that you schedule a follow-up appointment as needed with Dr. Angelena Form    Any Other Special Instructions Will Be Listed Below (If Applicable).     If you need a refill on your cardiac medications before your next appointment, please call your pharmacy.

## 2017-11-28 NOTE — Telephone Encounter (Signed)
Received fax from [facility] came indicating Melanie Little was prescribing patient Bystolic 10 mg, as we as her cardiologist prescribing her carvedilol 12.5. Although our records don't show bystolic on her medication list [facility] shows the rx was filled 10/17,11/21. Per [place] spoke with patient and she confirmed her cardiologist took her off of the bystolic 97DZ tab.

## 2017-12-09 ENCOUNTER — Ambulatory Visit: Payer: 59

## 2017-12-16 ENCOUNTER — Other Ambulatory Visit: Payer: 59

## 2017-12-17 ENCOUNTER — Other Ambulatory Visit (HOSPITAL_BASED_OUTPATIENT_CLINIC_OR_DEPARTMENT_OTHER): Payer: 59

## 2017-12-17 ENCOUNTER — Ambulatory Visit (HOSPITAL_COMMUNITY)
Admission: RE | Admit: 2017-12-17 | Discharge: 2017-12-17 | Disposition: A | Discharge status: Home / Self Care | Payer: 59 | Source: Ambulatory Visit | Attending: Gynecologic Oncology | Admitting: Gynecologic Oncology

## 2017-12-17 DIAGNOSIS — N83202 Unspecified ovarian cyst, left side: Secondary | ICD-10-CM | POA: Diagnosis present

## 2017-12-17 DIAGNOSIS — N83292 Other ovarian cyst, left side: Secondary | ICD-10-CM | POA: Insufficient documentation

## 2017-12-17 DIAGNOSIS — R971 Elevated cancer antigen 125 [CA 125]: Secondary | ICD-10-CM

## 2017-12-17 DIAGNOSIS — D252 Subserosal leiomyoma of uterus: Secondary | ICD-10-CM | POA: Diagnosis not present

## 2017-12-18 ENCOUNTER — Ambulatory Visit: Payer: 59 | Attending: Gynecologic Oncology | Admitting: Gynecologic Oncology

## 2017-12-18 ENCOUNTER — Encounter: Payer: Self-pay | Admitting: Gynecologic Oncology

## 2017-12-18 VITALS — BP 160/100 | HR 88 | Temp 98.4°F | Resp 20 | Wt 187.3 lb

## 2017-12-18 DIAGNOSIS — Z79899 Other long term (current) drug therapy: Secondary | ICD-10-CM | POA: Insufficient documentation

## 2017-12-18 DIAGNOSIS — Z9049 Acquired absence of other specified parts of digestive tract: Secondary | ICD-10-CM | POA: Diagnosis not present

## 2017-12-18 DIAGNOSIS — N83202 Unspecified ovarian cyst, left side: Secondary | ICD-10-CM | POA: Insufficient documentation

## 2017-12-18 DIAGNOSIS — I10 Essential (primary) hypertension: Secondary | ICD-10-CM | POA: Diagnosis not present

## 2017-12-18 DIAGNOSIS — Z823 Family history of stroke: Secondary | ICD-10-CM | POA: Insufficient documentation

## 2017-12-18 DIAGNOSIS — R971 Elevated cancer antigen 125 [CA 125]: Secondary | ICD-10-CM

## 2017-12-18 DIAGNOSIS — D259 Leiomyoma of uterus, unspecified: Secondary | ICD-10-CM | POA: Insufficient documentation

## 2017-12-18 LAB — CA 125: CANCER ANTIGEN (CA) 125: 37.6 U/mL (ref 0.0–38.1)

## 2017-12-18 NOTE — Progress Notes (Signed)
Consult Note: Gyn-Onc  Consult was requested by Dr. Quincy Simmonds for the evaluation of Melanie Little 46 y.o. female  CC:  Chief Complaint  Patient presents with  . Cyst of left ovary  . Elevated CA-125    Assessment/Plan:  Ms. Melanie Little  is a 46 y.o.  year old woman with a left ovarian 5-6cm ovarian cyst. It has been present and fairly stable for many years, though her CA 125 has increased from 93 in 2016 to 68 in October, 2018.  I suspect that the mass is most likely benign given that it is smaller in size and her CA 125 has dropped. However, I think that it would be prudent to recheck it in 6 months with repeat imaging.  Her BP remains poorly controlled and this would present an issue if she ever needed surgery. I am recommending she follow-up with Dr Buelah Manis to continue medication management.    HPI: Melanie Little is a 46 year old woman who is seen in consultation at the request of Dr Quincy Simmonds for a 6cm ovarian cyst.   The patient has a history of uterine fibroids (minimally symptomatic). For this she was followed with serial ultrasounds since at least 2008. She has been known to have a 5-6cm left ovarian complex cyst. It has remained fairly stable on imaging over time. Most recently an [place] on 10/04/17 showed a 10x7.8x6.9cm uterus with a 39m endometrium. The left ovary measured 6.4x4.6x3.7cm with avascular smooth margins, it appears echofree, slight increase in size since 2017.  Her CA 125 was 59.4 (it had been 31 in 2016.  The patient is asymptomatic. She has hypertension and has recently experienced poor control of this and needed changes in meds. She saw cardiology in November, 2018 who altered some medications and felt her diagnosis was most likely essential HTN.  Interval Hx: Pete ultrasound on 12/17/2017 showed a 12 cm fibroid uterus and normal right ovary and a left ovary measuring 6.9 x 3.7 x 4.1 cm with a complex cystic area measuring 6 x 3 x 2.1 cm with internal echoes noted this  was slightly smaller in dimensions than the previous outside ultrasound report and six-month follow-up was recommended repeat Ca1 25 on 12/17/2017 had reduced down to 37.6.    Current Meds:  Outpatient Encounter Medications as of 12/18/2017  Medication Sig  . carvedilol (COREG) 12.5 MG tablet Take 1 tablet (12.5 mg total) by mouth 2 (two) times daily.  . chlorhexidine (PERIDEX) 0.12 % solution Rinse with 1/2 ounce by mouth for 30 seconds then spit out. use twice a day  . losartan (COZAAR) 100 MG tablet Take 1 tablet (100 mg total) by mouth daily.  .Marland Kitchenspironolactone (ALDACTONE) 25 MG tablet Take 1 tablet (25 mg total) by mouth daily.   No facility-administered encounter medications on file as of 12/18/2017.     Allergy: No Known Allergies  Social Hx:   Social History   Socioeconomic History  . Marital status: Married    Spouse name: Not on file  . Number of children: 1  . Years of education: Not on file  . Highest education level: Not on file  Social Needs  . Financial resource strain: Not on file  . Food insecurity - worry: Not on file  . Food insecurity - inability: Not on file  . Transportation needs - medical: Not on file  . Transportation needs - non-medical: Not on file  Occupational History  . Occupation: [employer]  Tobacco Use  .  Smoking status: Never Smoker  . Smokeless tobacco: Never Used  Substance and Sexual Activity  . Alcohol use: No    Alcohol/week: 0.0 oz  . Drug use: No  . Sexual activity: Yes    Birth control/protection: Other-see comments    Comment: OTC vaginal pill  Other Topics Concern  . 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 BREAST REDUCTION  SURGERY  2005  . CHOLECYSTECTOMY  2004  . INCISION AND DRAINAGE Bilateral 2015   Bilateral axillary region ebaceous cyst    Past Medical Hx:  Past Medical History:  Diagnosis Date  . Abnormal Pap smear of cervix about 2013, 2016   Colpo biopsy with normal result per pt.  colpo 2016 HPV changes  . DUB (dysfunctional uterine bleeding)    around 2012, saw gyn at the time for eval  . Fibroid    multiple fibroids  . Hypertension   . Left ovarian cyst    do yearly ultrasound    Past Gynecological History:  Fibroids. Left ovarian cyst No LMP recorded.  Family Hx:  Family History  Problem Relation Age of Onset  . CVA Mother 59       deceased, was 20 at the age of her first stroke  . Diabetes Father   . Deep vein thrombosis Father   . Lupus Sister        questionable diagnosis per patient  . Breast cancer Maternal Aunt   . Breast cancer Maternal Aunt     Review of Systems:  Constitutional  Feels well,    ENT Normal appearing ears and nares bilaterally Skin/Breast  No rash, sores, jaundice, itching, dryness Cardiovascular  No chest pain, shortness of breath, or edema  Pulmonary  No cough or wheeze.  Gastro Intestinal  No nausea, vomitting, or diarrhoea. No bright red blood per rectum, no abdominal pain, change in bowel movement, or constipation.  Genito Urinary  No frequency, urgency, dysuria, menorrhagia Musculo Skeletal  No myalgia, arthralgia, joint swelling or pain  Neurologic  No weakness, numbness, change in gait,  Psychology  No depression, anxiety, insomnia.   Vitals:  Blood pressure (!) 160/100, pulse 88, temperature 98.4 F (36.9 C), temperature source Oral, resp. rate 20, weight 187 lb 4.8 oz (85 kg), SpO2 100 %.  Physical Exam: WD in NAD Neck  Supple NROM, without any enlargements.  Lymph Node Survey No cervical supraclavicular or inguinal adenopathy Cardiovascular  Pulse normal rate, regularity and rhythm. S1 and S2 normal.  Lungs  Clear to  auscultation bilateraly, without wheezes/crackles/rhonchi. Good air movement.  Skin  No rash/lesions/breakdown  Psychiatry  Alert and oriented to person, place, and time  Abdomen  Normoactive bowel sounds, abdomen soft, non-tender and obese without evidence of hernia.  Back No CVA tenderness Genito Urinary  Vulva/vagina: Normal external female genitalia.   No lesions. No discharge or bleeding.  Bladder/urethra:  No lesions or masses, well supported bladder  Vagina: normal  Cervix: Normal appearing, no lesions.  Uterus: globular,slightly enlarged, mobile, no parametrial involvement or nodularity.  Adnexa: no discrete masses. Rectal  deferred Extremities  No bilateral cyanosis, clubbing or edema.  Donaciano Eva, MD  12/18/2017, 1:39 PM

## 2017-12-18 NOTE — Patient Instructions (Signed)
Dr Denman George will repeat US and lab value in 6 months and see you again. Please contact her office at [phone] with questions.

## 2017-12-19 DIAGNOSIS — G4733 Obstructive sleep apnea (adult) (pediatric): Secondary | ICD-10-CM | POA: Diagnosis not present

## 2017-12-25 ENCOUNTER — Ambulatory Visit: Payer: 59 | Admitting: Gynecologic Oncology

## 2017-12-25 ENCOUNTER — Telehealth: Payer: Self-pay | Admitting: Gynecologic Oncology

## 2017-12-25 NOTE — Telephone Encounter (Signed)
Patient informed of CA 125 results.  No concerns voiced.

## 2018-01-13 ENCOUNTER — Ambulatory Visit
Admission: RE | Admit: 2018-01-13 | Discharge: 2018-01-13 | Disposition: A | Discharge status: Home / Self Care | Payer: 59 | Source: Ambulatory Visit | Attending: Family Medicine | Admitting: Family Medicine

## 2018-01-13 DIAGNOSIS — Z1231 Encounter for screening mammogram for malignant neoplasm of breast: Secondary | ICD-10-CM

## 2018-01-15 ENCOUNTER — Other Ambulatory Visit: Payer: Self-pay | Admitting: Family Medicine

## 2018-01-15 DIAGNOSIS — R928 Other abnormal and inconclusive findings on diagnostic imaging of breast: Secondary | ICD-10-CM

## 2018-01-20 ENCOUNTER — Ambulatory Visit: Payer: 59

## 2018-01-20 ENCOUNTER — Ambulatory Visit
Admission: RE | Admit: 2018-01-20 | Discharge: 2018-01-20 | Disposition: A | Discharge status: Home / Self Care | Payer: 59 | Source: Ambulatory Visit | Attending: Family Medicine | Admitting: Family Medicine

## 2018-01-20 DIAGNOSIS — R928 Other abnormal and inconclusive findings on diagnostic imaging of breast: Secondary | ICD-10-CM

## 2018-03-11 ENCOUNTER — Encounter: Payer: Self-pay | Admitting: Nurse Practitioner

## 2018-03-12 ENCOUNTER — Ambulatory Visit: Payer: 59 | Admitting: Nurse Practitioner

## 2018-03-12 ENCOUNTER — Encounter: Payer: Self-pay | Admitting: Nurse Practitioner

## 2018-03-12 VITALS — BP 166/101 | HR 93 | Wt 187.0 lb

## 2018-03-12 DIAGNOSIS — I10 Essential (primary) hypertension: Secondary | ICD-10-CM

## 2018-03-12 DIAGNOSIS — R51 Headache: Secondary | ICD-10-CM | POA: Diagnosis not present

## 2018-03-12 DIAGNOSIS — R202 Paresthesia of skin: Secondary | ICD-10-CM

## 2018-03-12 DIAGNOSIS — R200 Anesthesia of skin: Secondary | ICD-10-CM | POA: Diagnosis not present

## 2018-03-12 DIAGNOSIS — R519 Headache, unspecified: Secondary | ICD-10-CM | POA: Insufficient documentation

## 2018-03-12 DIAGNOSIS — G4733 Obstructive sleep apnea (adult) (pediatric): Secondary | ICD-10-CM | POA: Diagnosis not present

## 2018-03-12 NOTE — Progress Notes (Signed)
I agree with the assessment and plan as directed by NP .The patient is known to me .   Lokelani Lutes, MD  

## 2018-03-12 NOTE — Patient Instructions (Signed)
CPAP compliance 100 percent Blood pressure elevated on 3 anti-hypertensive drugs, continue to follow-up with primary care regarding blood pressure treatment Worsening headache and paresthesias mostly on the left side we will check MRI of the brain Will call with results Follow-up yearly for CPAP compliance

## 2018-03-12 NOTE — Progress Notes (Signed)
[facility]  PATIENT: Melanie Little DOB: 05/29/1971   REASON FOR VISIT: Follow-up for obstructive sleep apnea with CPAP HISTORY FROM: Patient   HISTORY OF PRESENT ILLNESS: Melanie Little is  a married African-American and left-handed female, presents today with a concern of excessive daytime fatigue and associated poor and nonrestorative sleep. She is especially concerned because her ability to focus seems to be impaired and sometimes she appears more forgetful than she could attribute to age or any other medical factors. She endorsed today the fatigue severity scale at 43 points and the Epworth sleepiness score at 9 points her husband has told her that she snores. Her husband now also has noted her to have apneas. He witnessed some. She feels that she doesn't get enough sleep and not restorative sleep in general. Risk factors are recent weight gain more than 20 pounds over the last 24 month. When she traveled with her sister last year, the to women share the bedroom. Her snoring was recorded by her roommate. Her cardiologist also advised her that her blood pressure was not responding to several drugs as well that this may be related to an underlying condition of obstructive sleep apnea. She spoke to her primary care physician, . Melanie Singer, DO  to be referred here to [facility] since her husband is also a patient here. 3 years ago , while the patient lived in [place], her primary care physician Melanie Little ordered a home sleep test she brought me a copy of the results. She was actually not diagnosed with significant apnea in Summer 2013 her AHI was 3 and her RDI was 9. He listed the following diagnosis in her history; excessive daytime sleepiness, essential hypertension on more than 2 drugs, she does not have a history of congestive heart disease, diabetes or cardiovascular disease.  Sleep habits are as follows: The patient onset my question about her  preferred bedtime as "anytime I can".  She returns from work between 5:30 and 6 PM and often likes to take a nap to get refreshed. These naps last 1 hour. She does not recall any dreams during naps. She fights dozing off in the evening hours and has sometimes trouble to stay awake watching a TV show for example. She usually transfers to the bedroom between 8:30 and 9 PM, she likes to watch TV in the bedroom. She will switch the TV off before falling asleep. The bedroom is otherwise cool, quiet and dark. She shares the bed with her husband. She has to go to the bathroom to urinate 5-6 times each night fragmenting her sleep. She estimates getting less than 5 hours of sleep and an average night. She does recall dreaming at nighttime. No nightmares and not specifically vivid dreams were reported. She rises at 6:50 AM . Usually listens to the news before she gets up, she wakes up spontaneously before the alarm rings. She sleeps on one pillow and prefers sleeping on the side, she does not have a sleep problem. When she wakes up she usually is still in the lateral recumbent position. Her breakfast is usually to go smoothly or yogurt she does not drink coffee or caffeine at it beverages at all. She drinks water and juice. At work she is without any access to natural daylight, working  in office. 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 Interval history on 03/04/2017,CD Melanie Little is here today with an excellent compliance report of 97%, fatigue  severity reduced to 15 points from previously 25, Epworth sleepiness score is endorsed at 0 points. Continued air leaks, but mask is more comfortable. Snores when mask dislodges, dry mouth.  Explained how to set humidifier. UPDATE 3/13/2019CM Melanie Little, 47 year old female returns for follow-up with history of obstructive sleep apnea on CPAP.  She also has new complaint today of headache that usually occurs during her menstrual  cycle but does not go away afterwards.  It is a dull headache without precipitating factors.  She has been taking Excedrin Migraine with little benefit.  She also complains of intermittent numbness mostly on the left side.  There is a family history of stroke in her mother.  She herself is hypertensive and is on 3 blood pressure medications.  CPAP compliance dated 02/10/2018-03/11/2018 shows compliance greater than 4 hours at 100%.  Average usage 7 hours 44 minutes.  Set pressure 6 cm.  EPR level 2.  AHI 3.2. ESS 0 REVIEW OF SYSTEMS: Full 14 system review of systems performed and notable only for those listed, all others are neg:  Constitutional: neg  Cardiovascular: neg Ear/Nose/Throat: neg  Skin: neg Eyes: neg Respiratory: neg Gastroitestinal: neg  Hematology/Lymphatic: neg  Endocrine: neg Musculoskeletal: Neck pain neck stiffness Allergy/Immunology: neg Neurological: No headache, numbness Psychiatric: neg Sleep : Obstructive sleep apnea on CPAP   ALLERGIES: No Known Allergies  HOME MEDICATIONS: Outpatient Medications Prior to Visit  Medication Sig Dispense Refill  . losartan (COZAAR) 100 MG tablet Take 1 tablet (100 mg total) by mouth daily. 90 tablet 3  . carvedilol (COREG) 12.5 MG tablet Take 1 tablet (12.5 mg total) by mouth 2 (two) times daily. 60 tablet 3  . spironolactone (ALDACTONE) 25 MG tablet Take 1 tablet (25 mg total) by mouth daily. 30 tablet 3  . chlorhexidine (PERIDEX) 0.12 % solution Rinse with 1/2 ounce by mouth for 30 seconds then spit out. use twice a day  0   No facility-administered medications prior to visit.     PAST MEDICAL HISTORY: Past Medical History:  Diagnosis Date  . Abnormal Pap smear of cervix about 2013, 2016   Colpo biopsy with normal result per pt.  colpo 2016 HPV changes  . DUB (dysfunctional uterine bleeding)    around 2012, saw gyn at the time for eval  . Fibroid    multiple fibroids  . Hypertension   . Left ovarian cyst    do yearly  ultrasound  . OSA on CPAP     PAST SURGICAL HISTORY: Past Surgical History:  Procedure Laterality Date  . BREAST REDUCTION SURGERY  2005  . CHOLECYSTECTOMY  2004  . INCISION AND DRAINAGE Bilateral 2015   Bilateral axillary region ebaceous cyst  . REDUCTION MAMMAPLASTY      FAMILY HISTORY: Family History  Problem Relation Age of Onset  . CVA Mother 16       deceased, was 32 at the age of her first stroke  . Diabetes Father   . Deep vein thrombosis Father   . Lupus Sister        questionable diagnosis per patient  . Breast cancer Maternal Aunt   . Breast cancer Maternal Aunt     SOCIAL HISTORY: Social History   Socioeconomic History  . Marital status: Married    Spouse name: Not on file  . Number of children: 1  . Years of education: Not on file  . Highest education level: Not on file  Social Needs  . Financial resource strain: Not on  file  . Food insecurity - worry: Not on file  . Food insecurity - inability: Not on file  . Transportation needs - medical: Not on file  . Transportation needs - non-medical: Not on file  Occupational History  . Occupation: [employer]  Tobacco Use  . Smoking status: Never Smoker  . Smokeless tobacco: Never Used  Substance and Sexual Activity  . Alcohol use: No    Alcohol/week: 0.0 oz  . Drug use: No  . Sexual activity: Yes    Birth control/protection: Other-see comments    Comment: OTC vaginal pill  Other Topics Concern  . Not on file  Social History Narrative   Work or School: Education officer, 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[employer]      Home Situation: lives with husband      Spiritual Beliefs: Baptist      Lifestyle: walks every other day; diet is bad        PHYSICAL EXAM  Vitals:   03/12/18 1453  BP: (!) 166/101  Pulse: 93  Weight: 187 lb (84.8 kg)   Body mass index is 32.1 kg/m.  Generalized: Well developed, morbidly obese female in no acute distress  Head: normocephalic and  atraumatic,. Oropharynx benign  Neck: Supple, no carotid bruits  Cardiac: Regular rate rhythm, no murmur  Musculoskeletal: No deformity   Neurological examination   Mentation: Alert oriented to time, place, history taking. Attention span and concentration appropriate. Recent and remote memory intact.  Follows all commands speech and language fluent.   Cranial nerve II-XII: .Pupils were equal round reactive to light extraocular movements were full, visual field were full on confrontational test. Facial sensation and strength were normal. hearing was intact to finger rubbing bilaterally. Uvula tongue midline. head turning and shoulder shrug were normal and symmetric.Tongue protrusion into cheek strength was normal. Motor: normal bulk and tone, full strength in the BUE, BLE, fine finger movements normal, no pronator drift. No focal weakness Sensory: normal and symmetric to light touch, pinprick, and  Vibration, in the upper and lower extremities Coordination: finger-nose-finger, heel-to-shin bilaterally, no dysmetria Reflexes: Symmetric upper and lower , plantar responses were flexor bilaterally. Gait and Station: Rising up from seated position without assistance, normal stance,  moderate stride, good arm swing, smooth turning, able to perform tiptoe, and heel walking without difficulty. Tandem gait is steady  DIAGNOSTIC DATA (LABS, IMAGING, TESTING) - I reviewed patient records, labs, notes, testing and imaging myself where available.  Lab Results  Component Value Date   WBC 9.0 10/22/2017   HGB 11.9 (L) 10/22/2017   HCT 35.9 (L) 10/22/2017   MCV 66.4 (L) 10/22/2017   PLT 353 10/22/2017      Component Value Date/Time   NA 139 11/04/2017 1028   K 4.6 11/04/2017 1028   CL 105 11/04/2017 1028   CO2 22 11/04/2017 1028   GLUCOSE 98 11/04/2017 1028   GLUCOSE 121 (H) 10/22/2017 1907   BUN 11 11/04/2017 1028   CREATININE 0.77 11/04/2017 1028   CREATININE 0.73 10/21/2017 1254   CALCIUM 8.8  11/04/2017 1028   PROT 6.7 10/21/2017 1254   ALBUMIN 4.1 04/30/2017 1529   AST 13 10/21/2017 1254   ALT 14 10/21/2017 1254   ALKPHOS 72 04/30/2017 1529   BILITOT 0.9 10/21/2017 1254   GFRNONAA 94 11/04/2017 1028   GFRNONAA >89 04/30/2017 1529   GFRAA 109 11/04/2017 1028   GFRAA >89 04/30/2017 1529   Lab Results  Component Value Date   CHOL  198 12/03/2016   HDL 59.30 12/03/2016   LDLCALC 109 (H) 12/03/2016   TRIG 152.0 (H) 12/03/2016   CHOLHDL 3 12/03/2016   Lab Results  Component Value Date   HGBA1C 6.0 (H) 10/21/2017   Lab Results  Component Value Date   HAFBXUXY33 383 04/30/2017   Lab Results  Component Value Date   TSH 2.09 12/03/2016      ASSESSMENT AND PLAN  47 y.o. year old female  has a past medical history of  Hypertension,  OSA on CPAP. here to follow-up for CPAP compliance.  She has new complaints of chronic headache and numbness on the left side.Compliance dated 02/10/2018-03/11/2018 shows compliance greater than 4 hours at 100%.  Average usage 7 hours 44 minutes.  Set pressure 6 cm.  EPR level 2.  AHI 3.2. ESS 0   PLAN: CPAP compliance 100 percent Blood pressure elevated on 3 anti-hypertensive drugs, continue to follow-up with primary care regarding blood pressure treatment Worsening headache and paresthesias mostly on the left side we will check MRI of the brain for abnormalities ,demyelinating disease Will call with results Follow-up yearly for CPAP compliance Dennie Bible, [facility], [facility], APRN  [facility] [address] [place], [place] [address] [phone]

## 2018-03-13 ENCOUNTER — Telehealth: Payer: Self-pay | Admitting: Nurse Practitioner

## 2018-03-13 NOTE — Telephone Encounter (Signed)
[employer] Auth: [id number] (exp. 03/13/18 to 04/27/18)

## 2018-03-13 NOTE — Telephone Encounter (Signed)
Patient scheduled for 03/19/18 on the [facility] mobile unit.

## 2018-03-14 ENCOUNTER — Ambulatory Visit: Payer: 59 | Admitting: Nurse Practitioner

## 2018-03-14 NOTE — Telephone Encounter (Signed)
Patient informed me that she is claustrophobic and would like to take something to help calm her nerves. She states she would like it sent to [facility].

## 2018-03-17 MED ORDER — ALPRAZOLAM 0.5 MG PO TABS: 0.5000 mg | ORAL_TABLET | Freq: Every evening | ORAL | 0 refills | Status: DC | PRN

## 2018-03-17 NOTE — Telephone Encounter (Signed)
Xanax Rx successfully faxed to [facility], [address] for patient to take prior to MRI.

## 2018-03-17 NOTE — Addendum Note (Signed)
Addended by: Otilio Jefferson on: 03/17/2018 10:44 AM   Modules accepted: Orders

## 2018-03-19 ENCOUNTER — Other Ambulatory Visit: Payer: Self-pay | Admitting: Nurse Practitioner

## 2018-03-19 ENCOUNTER — Ambulatory Visit: Payer: 59

## 2018-03-19 DIAGNOSIS — R200 Anesthesia of skin: Secondary | ICD-10-CM

## 2018-03-19 DIAGNOSIS — I10 Essential (primary) hypertension: Secondary | ICD-10-CM

## 2018-03-19 DIAGNOSIS — R51 Headache: Secondary | ICD-10-CM | POA: Diagnosis not present

## 2018-03-19 DIAGNOSIS — G4733 Obstructive sleep apnea (adult) (pediatric): Secondary | ICD-10-CM

## 2018-03-19 DIAGNOSIS — R202 Paresthesia of skin: Secondary | ICD-10-CM | POA: Diagnosis not present

## 2018-03-19 DIAGNOSIS — R519 Headache, unspecified: Secondary | ICD-10-CM

## 2018-03-19 DIAGNOSIS — G8929 Other chronic pain: Secondary | ICD-10-CM

## 2018-03-20 ENCOUNTER — Telehealth: Payer: Self-pay | Admitting: Nurse Practitioner

## 2018-03-20 NOTE — Telephone Encounter (Signed)
Patient had the MRI Brain yesterday on the [facility] mobile unit. But they were not able to do the contrast because they could not find a vein. Do you still want her to have a MRI Brain with contrast or do you have enough images from the exam that was done yesterday. Please advise.

## 2018-03-21 ENCOUNTER — Telehealth: Payer: Self-pay | Admitting: Neurology

## 2018-03-21 NOTE — Telephone Encounter (Signed)
-----   Message from Dennie Bible, NP sent at 03/21/2018  8:49 AM EDT ----- MRI of the brain no acute findings. Please call

## 2018-03-21 NOTE — Telephone Encounter (Signed)
Called the patient to make her aware of the normal MRI. No answer. LVM for the patient informing her of this information and instructed to call back if she had questions.

## 2018-03-22 ENCOUNTER — Encounter: Payer: Self-pay | Admitting: Nurse Practitioner

## 2018-03-24 NOTE — Telephone Encounter (Signed)
LVM advising patient that per Dr Brett Fairy, she did not need a repeat MRI since there were no acute findings. This RN will also reply to her my chart message.

## 2018-03-24 NOTE — Telephone Encounter (Signed)
These punctate lesions are usually microvascular changes-meaning changes in the capillary blood vessels of the brain -  This is seen in patients with migraine, hypertension, smokers and DM, and sometimes without any of these conditions , too.  It is not necessary to repeat an MRI with contrast - no acute findings were seen.

## 2018-03-26 NOTE — Telephone Encounter (Signed)
Received call form patient asking about her MRI. She had not read the my chart reply this RN sent to her on 03/24/18. This RN repeated what the my chart note said: Dr Brett Fairy stated that the findings in your MRI are form the tiny capillary blood vessels. This is seen in patients with migraines, hypertension, smoking, diabetes and also in other patient without these conditions too. A MRI with contrast is not needed since there were no acute findings. Patient has migraines and HTN. She verbalized understanding, appreciation.

## 2018-03-29 DIAGNOSIS — M62838 Other muscle spasm: Secondary | ICD-10-CM | POA: Diagnosis not present

## 2018-03-29 DIAGNOSIS — I10 Essential (primary) hypertension: Secondary | ICD-10-CM | POA: Diagnosis not present

## 2018-05-11 IMAGING — CR DG LUMBAR SPINE COMPLETE 4+V
4 series · 4 of 4 positions shown · non-contrast
Comparison: None.

CLINICAL DATA: Back pain

EXAM:
LUMBAR SPINE - COMPLETE 4+ VIEW

[w lumbar spine ap]
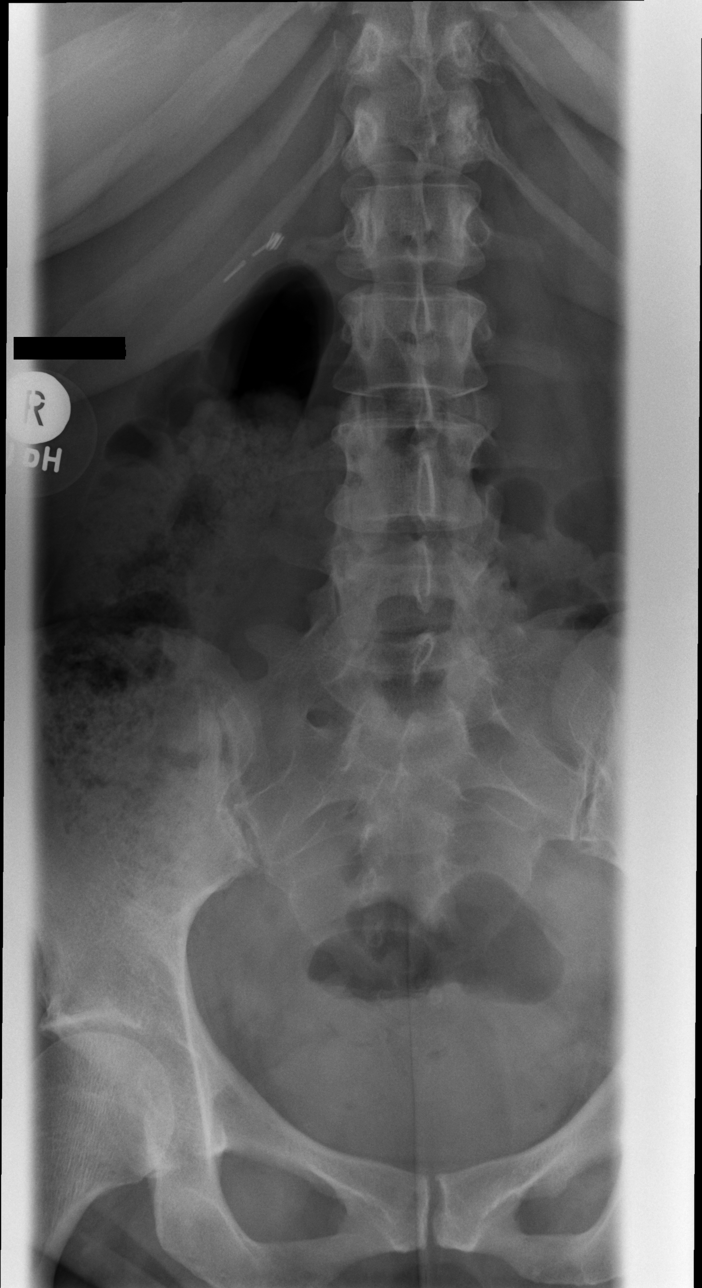

[w lumbar spine obl (1 of 2)]
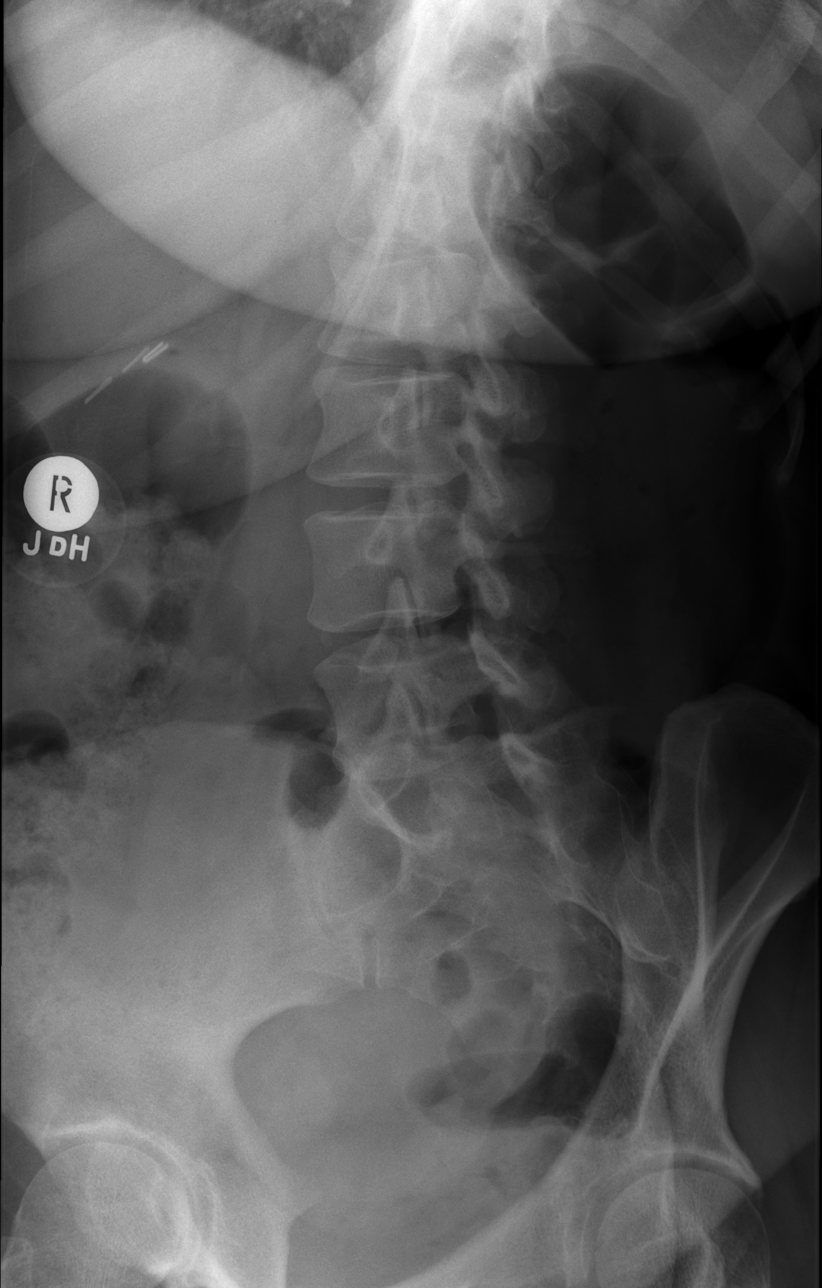

[w lumbar spine obl (2 of 2)]
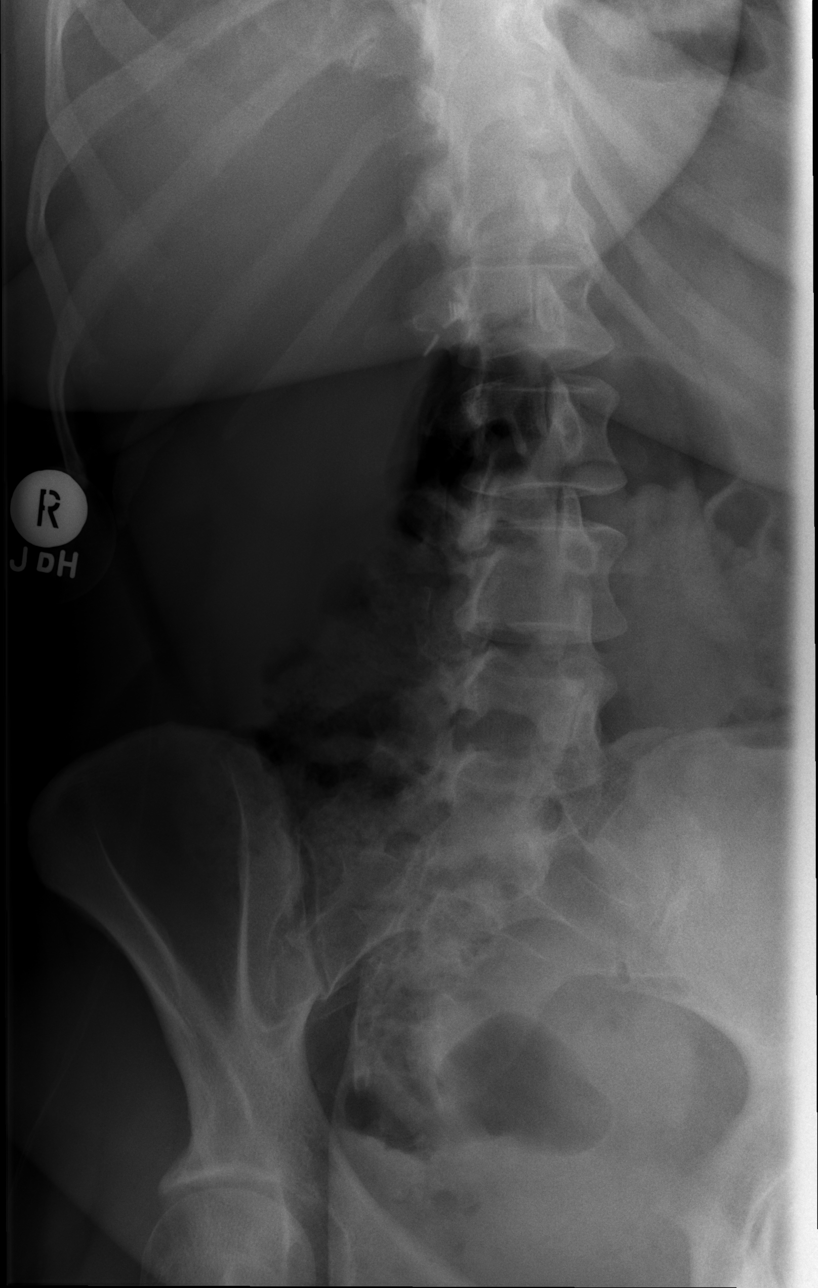

[w lumbar spine lat]
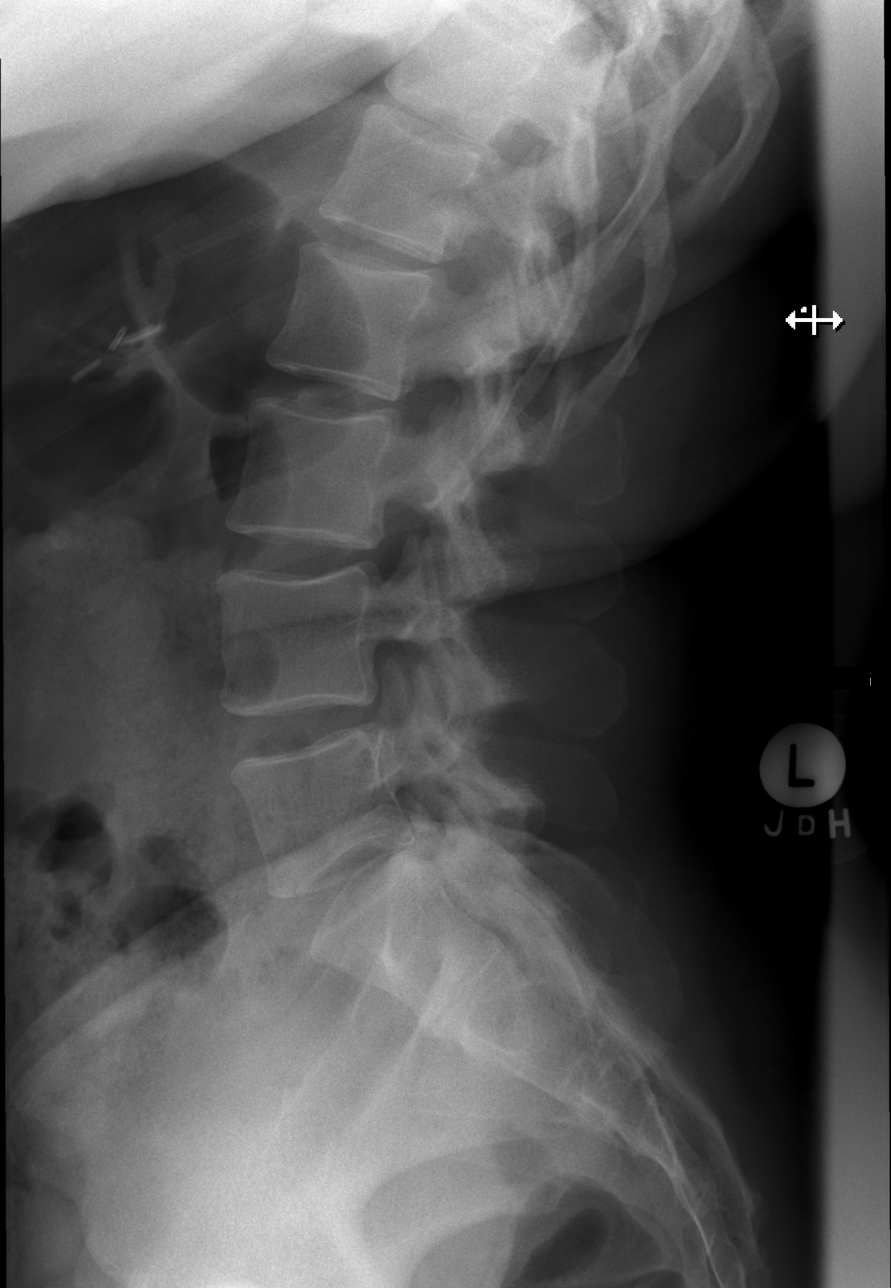

[4 of 4 positions shown; findings below may reference images not displayed]

FINDINGS: Surgical clips in the right upper quadrant. SI joints are patent.
Lumbar alignment within normal limits. Vertebral body heights are
normal. Minimal narrowing at L1-L2. Remaining disc spaces are
maintained.
IMPRESSION: Minimal degenerative change at L1-L2.  No acute osseous abnormality

## 2018-05-14 ENCOUNTER — Encounter: Payer: Self-pay | Admitting: Family Medicine

## 2018-05-14 ENCOUNTER — Ambulatory Visit: Payer: 59 | Admitting: Family Medicine

## 2018-05-14 ENCOUNTER — Other Ambulatory Visit: Payer: Self-pay

## 2018-05-14 VITALS — BP 174/110 | HR 75 | Temp 98.6°F | Resp 16 | Ht 64.0 in | Wt 186.4 lb

## 2018-05-14 DIAGNOSIS — R7303 Prediabetes: Secondary | ICD-10-CM | POA: Diagnosis not present

## 2018-05-14 DIAGNOSIS — L720 Epidermal cyst: Secondary | ICD-10-CM | POA: Diagnosis not present

## 2018-05-14 DIAGNOSIS — I10 Essential (primary) hypertension: Secondary | ICD-10-CM | POA: Diagnosis not present

## 2018-05-14 NOTE — Progress Notes (Signed)
Subjective:    Patient ID: Melanie Little, female    DOB: [birth year], 47 y.o.   MRN: [id number]  Patient presents for Hypertension (Patient states no longer takes hypertension medications) Pt here for her BP, she was referred to cardioogy back in for her uncontrolled BP and chest pain She states her BP did not improve with all of the medications so she stopped them all??? Last cardiology visit was Nov 2018 She has not tolerated Norvasc and HCTZ She was on Cozaar and bystolic 25ZD Hydralazine was added by cardiology  She was then changed from Coreg 12.75m BID ,losartan, and aldactone and hydralazine was stopped by cardiology.  Left on Coreg, Losartan, Aldactone  Echo showed LVH but preserved EF, Renal dopplers were normal in Nov 2018  She states her BP even when on the meds was high readings from last year her blood pressure range 150-198/91-22 with the medications.  She did note that when she would sit up after laying down in bed her blood pressure would be higher.  I reviewed the last cardiology notes was recommended that she had work-up for secondary hypertension such as Pheochromocytoma   Occasionally gets some dizzy-like spells but no chest pain or shortness of breath.  She does get migraines but this is typically around her menstrual cycle.  Of note she was seen by neurology and had work-up for her migraines she had an MRI of the brain which showed nonspecific [place]enbush matter changes more consistent with either migraine disorder hypertension or diabetes  She has 2 cyst that are getting bigger, would like removed  Review Of Systems:  GEN- denies fatigue, fever, weight loss,weakness, recent illness HEENT- denies eye drainage, change in vision, nasal discharge, CVS- denies chest pain, palpitations RESP- denies SOB, cough, wheeze ABD- denies N/V, change in stools, abd pain GU- denies dysuria, hematuria, dribbling, incontinence MSK- denies joint pain, muscle aches, injury Neuro- denies  headache, dizziness, syncope, seizure activity                Objective:    BP (!) 174/110 (BP Location: Left Arm, Patient Position: Sitting, Cuff Size: Normal)   Pulse 75   Temp 98.6 F (37 C) (Oral)   Resp 16   Ht 5' 4"  (1.626 m)   Wt 186 lb 6 oz (84.5 kg)   SpO2 98%   BMI 31.99 kg/m  GEN- NAD, alert and oriented x3 HEENT- PERRL, EOMI, non injected sclera, pink conjunctiva, MMM, oropharynx clear Neck- Supple, no thyromegaly CVS- RRR, no murmur RESP-CTAB ABD-NABS,soft,NT,ND sKIN- 2 small cyst palpated 1 upper left back, lower center of back, NT  EXT- No edema Pulses- Radial - 2+        Assessment & Plan:      Problem List Items Addressed This Visit    None    Visit Diagnoses    Borderline diabetes    -  Primary   Relevant Orders   Hemoglobin A1c   Uncontrolled hypertension       Uncontrolled BP, higher risk for Stroke, would benefit from at least 20-30 point drop in systolic. Recommend she restart losartan at 526mx 1 week, then increase to 1 full tablet 10026mwill do 24 hour urine catecholamine testing  Check TSH, CMET, LIPID She declines returning to cardiology     Relevant Orders   CBC with Differential/Platelet   Comprehensive metabolic panel   TSH   Catecholamines, fractionated, urine, 24 hour   Lipid panel     EPIDERMAL CYST-  REFERRAL TO DERMATOLOGY FOR EMOVAL     Note: This dictation was prepared with Dragon dictation along with smaller phrase technology. Any transcriptional errors that result from this process are unintentional.

## 2018-05-14 NOTE — Patient Instructions (Signed)
Dermataology referral Return urine sample Take 1/2 tablet for 1 week, then go to full tablet 173m of losartan  F/U pending results

## 2018-05-15 LAB — CBC WITH DIFFERENTIAL/PLATELET
BASOS ABS: 56 {cells}/uL (ref 0–200)
Basophils Relative: 0.6 %
EOS ABS: 140 {cells}/uL (ref 15–500)
Eosinophils Relative: 1.5 %
HCT: 38.3 % (ref 35.0–45.0)
Hemoglobin: 12.8 g/dL (ref 11.7–15.5)
Lymphs Abs: 2465 cells/uL (ref 850–3900)
MCH: 22.4 pg — AB (ref 27.0–33.0)
MCHC: 33.4 g/dL (ref 32.0–36.0)
MCV: 67 fL — AB (ref 80.0–100.0)
MONOS PCT: 7.4 %
MPV: 9.7 fL (ref 7.5–12.5)
Neutro Abs: 5952 cells/uL (ref 1500–7800)
Neutrophils Relative %: 64 %
PLATELETS: 412 10*3/uL — AB (ref 140–400)
RBC: 5.72 10*6/uL — ABNORMAL HIGH (ref 3.80–5.10)
RDW: 15.9 % — AB (ref 11.0–15.0)
TOTAL LYMPHOCYTE: 26.5 %
WBC mixed population: 688 cells/uL (ref 200–950)
WBC: 9.3 10*3/uL (ref 3.8–10.8)

## 2018-05-15 LAB — HEMOGLOBIN A1C
EAG (MMOL/L): 7.6 (calc)
Hgb A1c MFr Bld: 6.4 % of total Hgb — ABNORMAL HIGH (ref <5.7)
Mean Plasma Glucose: 137 (calc)

## 2018-05-15 LAB — COMPREHENSIVE METABOLIC PANEL
AG RATIO: 1.6 (calc) (ref 1.0–2.5)
ALT: 16 U/L (ref 6–29)
AST: 16 U/L (ref 10–30)
Albumin: 4.3 g/dL (ref 3.6–5.1)
Alkaline phosphatase (APISO): 76 U/L (ref 33–115)
BILIRUBIN TOTAL: 1.1 mg/dL (ref 0.2–1.2)
BUN: 7 mg/dL (ref 7–25)
CALCIUM: 9.2 mg/dL (ref 8.6–10.2)
CHLORIDE: 101 mmol/L (ref 98–110)
CO2: 29 mmol/L (ref 20–32)
Creat: 0.67 mg/dL (ref 0.50–1.10)
GLOBULIN: 2.7 g/dL (ref 1.9–3.7)
Glucose, Bld: 101 mg/dL — ABNORMAL HIGH (ref 65–99)
Potassium: 3.6 mmol/L (ref 3.5–5.3)
SODIUM: 139 mmol/L (ref 135–146)
TOTAL PROTEIN: 7 g/dL (ref 6.1–8.1)

## 2018-05-15 LAB — LIPID PANEL
Cholesterol: 196 mg/dL (ref <200)
HDL: 58 mg/dL (ref >50)
LDL Cholesterol (Calc): 112 mg/dL (calc) — ABNORMAL HIGH
NON-HDL CHOLESTEROL (CALC): 138 mg/dL — AB (ref <130)
Total CHOL/HDL Ratio: 3.4 (calc) (ref <5.0)
Triglycerides: 145 mg/dL (ref <150)

## 2018-05-15 LAB — TSH: TSH: 2.8 m[IU]/L

## 2018-05-23 ENCOUNTER — Encounter: Payer: Self-pay | Admitting: *Deleted

## 2018-05-26 DIAGNOSIS — I10 Essential (primary) hypertension: Secondary | ICD-10-CM | POA: Diagnosis not present

## 2018-05-27 ENCOUNTER — Other Ambulatory Visit: Payer: 59

## 2018-05-27 DIAGNOSIS — I10 Essential (primary) hypertension: Secondary | ICD-10-CM

## 2018-05-31 DIAGNOSIS — N83202 Unspecified ovarian cyst, left side: Secondary | ICD-10-CM

## 2018-05-31 HISTORY — DX: Unspecified ovarian cyst, left side: N83.202

## 2018-06-02 LAB — CATECHOLAMINES, FRACTIONATED, URINE, 24 HOUR
CALCULATED TOTAL (E+ NE): 71 ug/(24.h) (ref 26–121)
Creatinine, Urine mg/day-CATEUR: 1.58 g/(24.h) (ref 0.50–2.15)
Dopamine, 24 hr Urine: 394 mcg/24 h (ref 52–480)
Norepinephrine, 24 hr Ur: 71 mcg/24 h (ref 15–100)
VOLUME, URINE-VMAUR: 2550 mL

## 2018-06-19 ENCOUNTER — Ambulatory Visit (HOSPITAL_COMMUNITY)
Admission: RE | Admit: 2018-06-19 | Discharge: 2018-06-19 | Disposition: A | Discharge status: Home / Self Care | Payer: 59 | Source: Ambulatory Visit | Attending: Gynecologic Oncology | Admitting: Gynecologic Oncology

## 2018-06-19 ENCOUNTER — Inpatient Hospital Stay: Payer: 59 | Attending: Gynecologic Oncology

## 2018-06-19 DIAGNOSIS — D259 Leiomyoma of uterus, unspecified: Secondary | ICD-10-CM | POA: Insufficient documentation

## 2018-06-19 DIAGNOSIS — N83202 Unspecified ovarian cyst, left side: Secondary | ICD-10-CM | POA: Insufficient documentation

## 2018-06-19 DIAGNOSIS — R971 Elevated cancer antigen 125 [CA 125]: Secondary | ICD-10-CM | POA: Diagnosis not present

## 2018-06-19 DIAGNOSIS — N83292 Other ovarian cyst, left side: Secondary | ICD-10-CM | POA: Diagnosis not present

## 2018-06-20 ENCOUNTER — Encounter: Payer: Self-pay | Admitting: Gynecologic Oncology

## 2018-06-20 ENCOUNTER — Inpatient Hospital Stay (HOSPITAL_BASED_OUTPATIENT_CLINIC_OR_DEPARTMENT_OTHER): Payer: 59 | Admitting: Gynecologic Oncology

## 2018-06-20 VITALS — BP 161/75 | HR 86 | Temp 98.4°F | Resp 20 | Ht 64.0 in | Wt 190.9 lb

## 2018-06-20 DIAGNOSIS — R971 Elevated cancer antigen 125 [CA 125]: Secondary | ICD-10-CM | POA: Diagnosis not present

## 2018-06-20 DIAGNOSIS — N83202 Unspecified ovarian cyst, left side: Secondary | ICD-10-CM | POA: Diagnosis not present

## 2018-06-20 LAB — CA 125: Cancer Antigen (CA) 125: 35.7 U/mL (ref 0.0–38.1)

## 2018-06-20 NOTE — Patient Instructions (Signed)
Follow up ultrasound in six months and we will contact you with the results.  Follow up with Dr. Quincy Simmonds with pap after August of 2019.  Please call for any questions or concerns.

## 2018-06-20 NOTE — Progress Notes (Signed)
Consult Note: Gyn-Onc  Consult was requested by Dr. Quincy Simmonds for the evaluation of Melanie Little 47 y.o. female  CC:  Chief Complaint  Patient presents with  . Cyst of left ovary  . Elevated CA-125    Assessment/Plan:  Ms. Melanie Little  is a 47 y.o.  year old woman with a left ovarian 5-6cm ovarian cyst. It has been present and fairly stable for many years,and her CA 125 has now decreased and stabilized to within normal range (upper). It is asymptomatic. She is not interested in surgery if I have a low suspicion for cancer.  I suspect that the mass is most likely benign given that continues to be smaller in size and her CA 125 has dropped. However, I think that it would be prudent to recheck it in 6 months with repeat imaging one more time. After that, if stable, she can follow-up with Dr Quincy Simmonds. I do not feel strongly that she would require long term surveillance of the ovary since we have imaging since her cyst has been present for many years.   Her BP is somewhat better controlled and she is continuing to see Dr Buelah Manis to continue medication management.   HPI: Melanie Little is a 47 year old woman who is seen in consultation at the request of Dr Quincy Simmonds for a 6cm ovarian cyst.   The patient has a history of uterine fibroids (minimally symptomatic). For this she was followed with serial ultrasounds since at least 2008. She has been known to have a 5-6cm left ovarian complex cyst. It has remained fairly stable on imaging over time. Most recently an [place] on 10/04/17 showed a 10x7.8x6.9cm uterus with a 25m endometrium. The left ovary measured 6.4x4.6x3.7cm with avascular smooth margins, it appears echofree, slight increase in size since 2017.  Her CA 125 was 59.4 (it had been 31 in 2016).  The patient is asymptomatic. She has hypertension and has recently experienced poor control of this and needed changes in meds. She saw cardiology in November, 2018 who altered some medications and felt her  diagnosis was most likely essential HTN.  Interval Hx: Repeat ultrasound on 12/17/2017 showed a 12 cm fibroid uterus and normal right ovary and a left ovary measuring 6.9 x 3.7 x 4.1 cm with a complex cystic area measuring 6 x 3 x 2.1 cm with internal echoes noted this was slightly smaller in dimensions than the previous outside ultrasound report and six-month follow-up was recommended repeat Ca1 25 on 12/17/2017 had reduced down to 37.6.  Repeat [place] 06[place]19[place]18 showed a 4.3 x 3.1 x 3.8 cm complex left ovarian cyst, which could reflect a hemorrhagic cyst. This is decreased in size from previous. Given provided history, continued follow-up is warranted, suggest repeat ultrasound in 3-6 months. Small to moderate volume complex free fluid within the pelvis. Fibroid uterus. Repeat CA 125 on 06/19/18 was decreased slightly/stable at 35.7.   Her BP meds were adjusted and she is on single agent losartan. Pheo has been ruled out.  She continues to have no symptoms from the left ovarian cyst or fibroids and is not particularly interested in surgery, unless for concern of cancer.     Current Meds:  Outpatient Encounter Medications as of 06/20/2018  Medication Sig  . losartan (COZAAR) 100 MG tablet Take 1 tablet (100 mg total) by mouth daily.  . carvedilol (COREG) 12.5 MG tablet Take 1 tablet (12.5 mg total) by mouth 2 (two) times daily.  .Marland Kitchenspironolactone (ALDACTONE) 25 MG tablet  Take 1 tablet (25 mg total) by mouth daily.   No facility-administered encounter medications on file as of 06/20/2018.     Allergy: No Known Allergies  Social Hx:   Social History   Socioeconomic History  . Marital status: Married    Spouse name: Not on file  . Number of children: 1  . Years of education: Not on file  . Highest education level: Not on file  Occupational History  . Occupation: [employer]  Social Needs  . Financial resource strain: Not on file  . Food insecurity:    Worry: Not on file     Inability: Not on file  . Transportation needs:    Medical: Not on file    Non-medical: Not on file  Tobacco Use  . Smoking status: Never Smoker  . Smokeless tobacco: Never Used  Substance and Sexual Activity  . Alcohol use: No    Alcohol/week: 0.0 oz  . Drug use: No  . Sexual activity: Yes    Birth control/protection: Other-see comments    Comment: OTC vaginal pill  Lifestyle  . Physical activity:    Days per week: Not on file    Minutes per session: Not on file  . Stress: Not on file  Relationships  . Social connections:    Talks on phone: Not on file    Gets together: Not on file    Attends religious service: Not on file    Active member of club or organization: Not on file    Attends meetings of clubs or organizations: Not on file    Relationship status: Not on file  . Intimate partner violence:    Fear of current or ex partner: Not on file    Emotionally abused: Not on file    Physically abused: Not on file    Forced sexual activity: Not on file  Other Topics Concern  . Not on file  Social History Narrative   Work or School: Education officer, 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[employer]      Home Situation: lives with husband      Spiritual Beliefs: Baptist      Lifestyle: walks every other day; diet is bad       Past Surgical Hx:  Past Surgical History:  Procedure Laterality Date  . BREAST REDUCTION SURGERY  2005  . CHOLECYSTECTOMY  2004  . INCISION AND DRAINAGE Bilateral 2015   Bilateral axillary region ebaceous cyst  . REDUCTION MAMMAPLASTY      Past Medical Hx:  Past Medical History:  Diagnosis Date  . Abnormal Pap smear of cervix about 2013, 2016   Colpo biopsy with normal result per pt.  colpo 2016 HPV changes  . DUB (dysfunctional uterine bleeding)    around 2012, saw gyn at the time for eval  . Fibroid    multiple fibroids  . Hypertension   . Left ovarian cyst    do yearly ultrasound  . OSA on CPAP     Past Gynecological History:   Fibroids. Left ovarian cyst No LMP recorded.  Family Hx:  Family History  Problem Relation Age of Onset  . CVA Mother 7       deceased, was 41 at the age of her first stroke  . Diabetes Father   . Deep vein thrombosis Father   . Lupus Sister        questionable diagnosis per patient  . Breast cancer Maternal Aunt   . Breast cancer Maternal  Aunt     Review of Systems:  Constitutional  Feels well,    ENT Normal appearing ears and nares bilaterally Skin/Breast  No rash, sores, jaundice, itching, dryness Cardiovascular  No chest pain, shortness of breath, or edema  Pulmonary  No cough or wheeze.  Gastro Intestinal  No nausea, vomitting, or diarrhoea. No bright red blood per rectum, no abdominal pain, change in bowel movement, or constipation.  Genito Urinary  No frequency, urgency, dysuria, menorrhagia Musculo Skeletal  No myalgia, arthralgia, joint swelling or pain  Neurologic  No weakness, numbness, change in gait,  Psychology  No depression, anxiety, insomnia.   Vitals:  Blood pressure (!) 161/75, pulse 86, temperature 98.4 F (36.9 C), temperature source Oral, resp. rate 20, height 5' 4"  (1.626 m), weight 190 lb 14.4 oz (86.6 kg), SpO2 100 %.  Physical Exam: WD in NAD Neck  Supple NROM, without any enlargements.  Lymph Node Survey No cervical supraclavicular or inguinal adenopathy Cardiovascular  Pulse normal rate, regularity and rhythm. S1 and S2 normal.  Lungs  Clear to auscultation bilateraly, without wheezes/crackles/rhonchi. Good air movement.  Skin  No rash/lesions/breakdown  Psychiatry  Alert and oriented to person, place, and time  Abdomen  Normoactive bowel sounds, abdomen soft, non-tender and obese without evidence of hernia.  Back No CVA tenderness Genito Urinary  Vulva/vagina: Normal external female genitalia.   No lesions. No discharge or bleeding.  Bladder/urethra:  No lesions or masses, well supported bladder  Vagina: normal  Cervix:  Normal appearing, no lesions.  Uterus: globular,slightly enlarged, mobile, no parametrial involvement or nodularity.  Adnexa: no discrete masses. Rectal  deferred Extremities  No bilateral cyanosis, clubbing or edema.   Thereasa Solo, MD  06/20/2018, 3:20 PM

## 2018-06-23 ENCOUNTER — Ambulatory Visit: Payer: Self-pay | Admitting: Family Medicine

## 2018-06-25 ENCOUNTER — Encounter: Payer: Self-pay | Admitting: Obstetrics and Gynecology

## 2018-07-01 ENCOUNTER — Encounter: Payer: Self-pay | Admitting: Family Medicine

## 2018-07-01 ENCOUNTER — Ambulatory Visit: Payer: 59 | Admitting: Family Medicine

## 2018-07-01 ENCOUNTER — Other Ambulatory Visit: Payer: Self-pay

## 2018-07-01 VITALS — BP 148/82 | HR 72 | Temp 98.2°F | Resp 12 | Ht 64.0 in | Wt 191.0 lb

## 2018-07-01 DIAGNOSIS — Z6832 Body mass index (BMI) 32.0-32.9, adult: Secondary | ICD-10-CM | POA: Diagnosis not present

## 2018-07-01 DIAGNOSIS — E6609 Other obesity due to excess calories: Secondary | ICD-10-CM | POA: Diagnosis not present

## 2018-07-01 DIAGNOSIS — R7303 Prediabetes: Secondary | ICD-10-CM | POA: Diagnosis not present

## 2018-07-01 DIAGNOSIS — I10 Essential (primary) hypertension: Secondary | ICD-10-CM | POA: Diagnosis not present

## 2018-07-01 MED ORDER — LOSARTAN POTASSIUM 100 MG PO TABS: 100.0000 mg | ORAL_TABLET | Freq: Every day | ORAL | 3 refills | Status: DC

## 2018-07-01 NOTE — Assessment & Plan Note (Signed)
Discussed low-carb diet also discussed the Mediterranean diet she does have high blood pressure.  She has a treadmill at home recommend she uses for 15 minutes 4 to 5 days a week.

## 2018-07-01 NOTE — Progress Notes (Signed)
   Subjective:    Patient ID: Melanie Little, female    DOB: 03-Apr-1971, 47 y.o.   MRN: [id number]  Patient presents for Follow-up (is not fasting)  HTN- taking losartan 190m , home readings mostly 140's/ 80's at home, has been down to 130's before, Feels good, her recent fasting labs reviewed, she is borderline Diabetic, A1C 6.4% She is not watching diet, or exercising  Currently   no Chest pain, no SOB, no dizziness  Gets migraines at menstrual cycle  No meds taken this morning   Note-weight normal renal function normal.  She also had catecholamine testing which was negative with regards to her blood pressure.  Review Of Systems:  GEN- denies fatigue, fever, weight loss,weakness, recent illness HEENT- denies eye drainage, change in vision, nasal discharge, CVS- denies chest pain, palpitations RESP- denies SOB, cough, wheeze ABD- denies N/V, change in stools, abd pain GU- denies dysuria, hematuria, dribbling, incontinence MSK- denies joint pain, muscle aches, injury Neuro- denies headache, dizziness, syncope, seizure activity       Objective:    BP (!) 148/82   Pulse 72   Temp 98.2 F (36.8 C) (Oral)   Resp 12   Ht 5' 4"  (1.626 m)   Wt 191 lb (86.6 kg)   SpO2 99%   BMI 32.79 kg/m  GEN- NAD, alert and oriented x3 HEENT- PERRL, EOMI, non injected sclera, pink conjunctiva, MMM, oropharynx clear Neck- Supple, no thyromegaly CVS- RRR, no murmur RESP-CTAB EXT- No edema Pulses- Radial  2+        Assessment & Plan:      Problem List Items Addressed This Visit      Unprioritized   Borderline diabetes    And to recheck her labs in November.  We discussed cutting out the carbs and snack food.  She often eats Reesces every day.      Essential hypertension - Primary    She has been consistent with a low start her blood pressure does look much better today.  She can continue with her losartan will call me if her blood pressure at home is greater than 150s over 90s.   The next that would be to add back on the beta-blocker.      Relevant Medications   losartan (COZAAR) 100 MG tablet   Obese    Discussed low-carb diet also discussed the Mediterranean diet she does have high blood pressure.  She has a treadmill at home recommend she uses for 15 minutes 4 to 5 days a week.         Note: This dictation was prepared with Dragon dictation along with smaller phrase technology. Any transcriptional errors that result from this process are unintentional.

## 2018-07-01 NOTE — Patient Instructions (Addendum)
Med[place] diet  Continue losartan as prescribed F/U end of November

## 2018-07-01 NOTE — Assessment & Plan Note (Addendum)
And to recheck her labs in November.  We discussed cutting out the carbs and snack food.  She often eats Reesces every day.

## 2018-07-01 NOTE — Assessment & Plan Note (Signed)
She has been consistent with a low start her blood pressure does look much better today.  She can continue with her losartan will call me if her blood pressure at home is greater than 150s over 90s.  The next that would be to add back on the beta-blocker.

## 2018-07-29 DIAGNOSIS — L720 Epidermal cyst: Secondary | ICD-10-CM | POA: Diagnosis not present

## 2018-07-29 DIAGNOSIS — D229 Melanocytic nevi, unspecified: Secondary | ICD-10-CM | POA: Diagnosis not present

## 2018-08-01 ENCOUNTER — Other Ambulatory Visit: Payer: Self-pay

## 2018-08-04 DIAGNOSIS — I10 Essential (primary) hypertension: Secondary | ICD-10-CM | POA: Diagnosis not present

## 2018-08-04 DIAGNOSIS — L02213 Cutaneous abscess of chest wall: Secondary | ICD-10-CM | POA: Diagnosis not present

## 2018-09-11 NOTE — Progress Notes (Signed)
47 y.o. G39P1031 Married [place] American female here for annual exam.    Menses monthly, lasting 5 days total.  No bleeding in between cycles.  Has heavy cycles, but no change.  No staining of clothing.  Pad change 4 times a day, using overnight pads.   Known fibroids.  No abdominal or bladder pressure.  No early satiety.   Hx left ovarian cyst.   Had elevated CA125 which since dropped back down.  Due for imaging and CA125 with Dr. Denman George in December 2019.   Patient is asking to do the ultrasound here in December.  Wants a pap today.   Labs with PCP.  Now prediabetic.  Changing diet and exercising.   Monitors her BP at home.  Usually 140 - 150/110.  PCP:   Dr Buelah Manis  Patient's last menstrual period was 08/31/2018.           Sexually active: Yes.    The current method of family planning is spermacide OTC.    Exercising: Yes.    walking.  Going to the gym.  Smoker:  no  Health Maintenance: Pap:  08/30/2017 - negative, neg HR HPV.  Pap 08/29/16 - neg and HR HPV neg.    History of abnormal Pap:  Yes, colposcopy 07/2016 - CIN I.  MMG:  01/20/2018 BI-RADS CATEGORY  1: Negative. Colonoscopy:  n/a BMD:   n/a  Result  n/a TDaP:  2011 Gardasil:   no HIV:  Testing in pregnancy.  Hep C: --- Screening Labs:  Hb today: n/a, Urine today: n/a   reports that she has never smoked. She has never used smokeless tobacco. She reports that she does not drink alcohol or use drugs.  Past Medical History:  Diagnosis Date  . Abnormal Pap smear of cervix about 2013, 2016   Colpo biopsy with normal result per pt.  colpo 2016 HPV changes  . DUB (dysfunctional uterine bleeding)    around 2012, saw gyn at the time for eval  . Elevated hemoglobin A1c   . Fibroid    multiple fibroids  . Hypertension   . Left ovarian cyst 05/2018   do yearly ultrasound.  Doing GYN ONC consulation for monitoring.   . OSA on CPAP     Past Surgical History:  Procedure Laterality Date  . BREAST REDUCTION  SURGERY  2005  . CHOLECYSTECTOMY  2004  . INCISION AND DRAINAGE Bilateral 2015   Bilateral axillary region ebaceous cyst  . REDUCTION MAMMAPLASTY      Current Outpatient Medications  Medication Sig Dispense Refill  . losartan (COZAAR) 100 MG tablet Take 1 tablet (100 mg total) by mouth daily. 90 tablet 3   No current facility-administered medications for this visit.     Family History  Problem Relation Age of Onset  . CVA Mother 61       deceased, was 93 at the age of her first stroke  . Diabetes Father   . Deep vein thrombosis Father   . Lupus Sister        questionable diagnosis per patient  . Breast cancer Maternal Aunt   . Breast cancer Maternal Aunt     Review of Systems  Constitutional: Negative.   HENT: Negative.   Eyes: Negative.   Respiratory: Negative.   Cardiovascular: Negative.   Gastrointestinal: Negative.   Endocrine: Negative.   Genitourinary: Negative.   Musculoskeletal: Negative.   Skin: Negative.   Allergic/Immunologic: Negative.   Neurological: Negative.   Hematological: Negative.   Psychiatric/Behavioral:  Negative.   All other systems reviewed and are negative.   Exam:   BP (!) 169/100 (BP Location: Left Arm, Patient Position: Sitting, Cuff Size: Large)   Ht 5' 4"  (1.626 m)   Wt 185 lb (83.9 kg)   LMP 08/31/2018   BMI 31.76 kg/m     General appearance: alert, cooperative and appears stated age Head: Normocephalic, without obvious abnormality, atraumatic Neck: no adenopathy, supple, symmetrical, trachea midline and thyroid normal to inspection and palpation Lungs: clear to auscultation bilaterally Breasts: consistent with reduction and keloid scar, no masses or tenderness, No nipple retraction or dimpling, No nipple discharge or bleeding, No axillary or supraclavicular adenopathy Heart: regular rate and rhythm Abdomen: soft, non-tender; no masses, no organomegaly Extremities: extremities normal, atraumatic, no cyanosis or edema Skin: Skin  color, texture, turgor normal. No rashes or lesions Lymph nodes: Cervical, supraclavicular, and axillary nodes normal. No abnormal inguinal nodes palpated Neurologic: Grossly normal  Pelvic: External genitalia:  no lesions              Urethra:  normal appearing urethra with no masses, tenderness or lesions              Bartholins and Skenes: normal                 Vagina: normal appearing vagina with normal color and discharge, no lesions              Cervix: no lesions              Pap taken: Yes.   Bimanual Exam:  Uterus:   11 week size.               Adnexa: no mass, fullness, tenderness on right.  Fullness on left behind cervix.               Rectal exam: Yes.  .  Confirms.              Anus:  normal sphincter tone, no lesions  Chaperone was present for exam.  Assessment:   Well woman visit with normal exam. Fibroids.  Large left ovarian cyst.  Hx CIN I. Keloid scars form breast reduction.  HTN. Elevated HgbA1C.  FH CVA.   Plan: Mammogram screening. Recommended self breast awareness. Pap and HR HPV as above. Guidelines for Calcium, Vitamin D, regular exercise program including cardiovascular and weight bearing exercise. We discussed weight loss and low sugar and low carb diet to control diabetes and elevated BP. I mentioned weight management programs through [place], [place], or [employer].  IFOB.  Return for pelvic US and CA125 here in December.  Follow up annually and prn.   After visit summary provided.

## 2018-09-12 ENCOUNTER — Other Ambulatory Visit: Payer: Self-pay

## 2018-09-12 ENCOUNTER — Ambulatory Visit: Payer: 59 | Admitting: Obstetrics and Gynecology

## 2018-09-12 ENCOUNTER — Other Ambulatory Visit (HOSPITAL_COMMUNITY)
Admission: RE | Admit: 2018-09-12 | Discharge: 2018-09-12 | Disposition: A | Discharge status: Home / Self Care | Payer: 59 | Source: Ambulatory Visit | Attending: Obstetrics and Gynecology | Admitting: Obstetrics and Gynecology

## 2018-09-12 ENCOUNTER — Encounter: Payer: Self-pay | Admitting: Obstetrics and Gynecology

## 2018-09-12 VITALS — BP 169/100 | HR 78 | Resp 18 | Ht 64.0 in | Wt 185.0 lb

## 2018-09-12 DIAGNOSIS — N83202 Unspecified ovarian cyst, left side: Secondary | ICD-10-CM | POA: Diagnosis not present

## 2018-09-12 DIAGNOSIS — Z1211 Encounter for screening for malignant neoplasm of colon: Secondary | ICD-10-CM | POA: Diagnosis not present

## 2018-09-12 DIAGNOSIS — Z01419 Encounter for gynecological examination (general) (routine) without abnormal findings: Secondary | ICD-10-CM | POA: Insufficient documentation

## 2018-09-12 DIAGNOSIS — D219 Benign neoplasm of connective and other soft tissue, unspecified: Secondary | ICD-10-CM | POA: Diagnosis not present

## 2018-09-12 NOTE — Patient Instructions (Signed)

## 2018-09-15 ENCOUNTER — Telehealth: Payer: Self-pay | Admitting: Obstetrics and Gynecology

## 2018-09-15 DIAGNOSIS — Z1211 Encounter for screening for malignant neoplasm of colon: Secondary | ICD-10-CM | POA: Diagnosis not present

## 2018-09-15 NOTE — Telephone Encounter (Signed)
Call placed to patient to review benefits for recommended ultrasound and schedule appointment in December 2019, per last office notes. Left voicemail message requesting a return call

## 2018-09-16 DIAGNOSIS — G4733 Obstructive sleep apnea (adult) (pediatric): Secondary | ICD-10-CM | POA: Diagnosis not present

## 2018-09-16 LAB — CYTOLOGY - PAP
ADEQUACY: ABSENT
Diagnosis: NEGATIVE
HPV: NOT DETECTED

## 2018-09-16 LAB — FECAL OCCULT BLOOD, IMMUNOCHEMICAL: Fecal Occult Bld: NEGATIVE

## 2018-09-16 LAB — SPECIMEN STATUS REPORT

## 2018-11-09 DIAGNOSIS — L732 Hidradenitis suppurativa: Secondary | ICD-10-CM | POA: Diagnosis not present

## 2018-11-09 DIAGNOSIS — L02412 Cutaneous abscess of left axilla: Secondary | ICD-10-CM | POA: Diagnosis not present

## 2018-11-24 ENCOUNTER — Telehealth: Payer: Self-pay | Admitting: Obstetrics and Gynecology

## 2018-11-24 NOTE — Telephone Encounter (Signed)
Call placed to convey benefits for a scheduled ultrasound appointment. Left a voicemail message requesting a return call

## 2018-11-24 NOTE — Telephone Encounter (Signed)
Patient returned call. Spoke with patient regarding benefit for scheduled ultrasound. Patient understood and agreeable. Patient scheduled 12/04/18 with Dr Quincy Simmonds. Patient aware of appointment date, arrival time and cancellation policy. No further questions. Ok to close

## 2018-11-25 ENCOUNTER — Encounter: Payer: Self-pay | Admitting: Family Medicine

## 2018-11-25 ENCOUNTER — Other Ambulatory Visit: Payer: Self-pay

## 2018-11-25 ENCOUNTER — Encounter: Payer: Self-pay | Admitting: *Deleted

## 2018-11-25 ENCOUNTER — Ambulatory Visit: Payer: 59 | Admitting: Family Medicine

## 2018-11-25 VITALS — BP 138/80 | HR 74 | Temp 98.4°F | Resp 14 | Ht 64.0 in | Wt 189.0 lb

## 2018-11-25 DIAGNOSIS — L02422 Furuncle of left axilla: Secondary | ICD-10-CM | POA: Diagnosis not present

## 2018-11-25 DIAGNOSIS — E6609 Other obesity due to excess calories: Secondary | ICD-10-CM

## 2018-11-25 DIAGNOSIS — R7303 Prediabetes: Secondary | ICD-10-CM

## 2018-11-25 DIAGNOSIS — I10 Essential (primary) hypertension: Secondary | ICD-10-CM | POA: Diagnosis not present

## 2018-11-25 DIAGNOSIS — Z6832 Body mass index (BMI) 32.0-32.9, adult: Secondary | ICD-10-CM

## 2018-11-25 MED ORDER — IBUPROFEN 600 MG PO TABS: 600.0000 mg | ORAL_TABLET | Freq: Three times a day (TID) | ORAL | 1 refills | Status: DC | PRN

## 2018-11-25 MED ORDER — DOXYCYCLINE HYCLATE 100 MG PO TABS: 100.0000 mg | ORAL_TABLET | Freq: Two times a day (BID) | ORAL | 0 refills | Status: DC

## 2018-11-25 MED ORDER — LIRAGLUTIDE -WEIGHT MANAGEMENT 18 MG/3ML ~~LOC~~ SOPN: 0.6000 mg | PEN_INJECTOR | Freq: Every day | SUBCUTANEOUS | 3 refills | Status: DC

## 2018-11-25 NOTE — Progress Notes (Signed)
   Subjective:    Patient ID: Melanie Little, female    DOB: Jun 09, 1971, 47 y.o.   MRN: [id number]  Patient presents for Follow-up (is not fasting) and Abscess (has abscess to L axilla and in between breast- has been seen at [facility] and given Doxy, but areas did not reslove)  Here to follow-up chronic medical problems.  Medications reviewed Hypertension-losartan 100 mg as prescribed   At home 140/ 80-90's,   Still gets headache especially on her period    Obesity- she wsa working out for a month but stopped. She is borderline diabetic, last A1C 6.4%, due for repeat, she is interested in trying a new weight loss medication Phentermine interacted with her blood pressure    Boil beneath left axilla, had bactrim, had I &D by UC 2 weeks ago. Still present, has one now that came up between breast  has history of boils   No fever or vomiting   Review Of Systems:  GEN- denies fatigue, fever, weight loss,weakness, recent illness HEENT- denies eye drainage, change in vision, nasal discharge, CVS- denies chest pain, palpitations RESP- denies SOB, cough, wheeze ABD- denies N/V, change in stools, abd pain GU- denies dysuria, hematuria, dribbling, incontinence MSK- denies joint pain, muscle aches, injury Neuro- denies headache, dizziness, syncope, seizure activity       Objective:    BP 138/80   Pulse 74   Temp 98.4 F (36.9 C) (Oral)   Resp 14   Ht 5' 4"  (1.626 m)   Wt 189 lb (85.7 kg)   LMP 11/25/2018   SpO2 97%   BMI 32.44 kg/m  GEN- NAD, alert and oriented x3 HEENT- PERRL, EOMI, non injected sclera, pink conjunctiva, MMM, oropharynx clear Neck- Supple, no thyromegaly CVS- RRR, no murmur RESP-CTAB Skin- small boil in left axilla, mild TTP no fluctance, mild induration, small hardened indurated area center of breast top of cleavage, no erythema EXT- No edema Pulses- Radial 2+        Assessment & Plan:      Problem List Items Addressed This Visit      Unprioritized   Borderline diabetes   Relevant Orders   Hemoglobin A1c   Essential hypertension - Primary    Pressure looks okay today in the office.  She is still monitoring at home.  Prefers not to go on multiple medications that she did not see significant change in her blood pressures before and due to the side effects.      Relevant Orders   Comprehensive metabolic panel   Obese    Obesity with risk factors of hypertension and borderline diabetes mellitus.  She would benefit from significant weight loss.  We discussed dietary changes low-carb more fruits that she is in protein.  Also discussed exercise.  She is going to try Saxenda she was shown how to use this medication here in the office.  This will help with both her glucose and her weight.  Start was 0.6 mg and titrate up to 3 mg.      Relevant Medications   Liraglutide -Weight Management (SAXENDA) 18 MG/3ML SOPN    Other Visit Diagnoses    Furuncle of left axilla       Second reound of antibiotics- doxycycline for residual infection uses anti-bacterial soap, no area to I &D today      Note: This dictation was prepared with Dragon dictation along with smaller phrase technology. Any transcriptional errors that result from this process are unintentional.

## 2018-11-25 NOTE — Patient Instructions (Addendum)
Start saxenda:  Week 1: 0.22m injected daily:   Week 2:  1.2 mg injected daily  Week 3:  1.859minjected daily  Week 4:  2.33m17mnjected daily  Week 5:  3mg533mjected daily Continue your blood pressure medication  F/U 6 weeks

## 2018-11-25 NOTE — Assessment & Plan Note (Signed)
Obesity with risk factors of hypertension and borderline diabetes mellitus.  She would benefit from significant weight loss.  We discussed dietary changes low-carb more fruits that she is in protein.  Also discussed exercise.  She is going to try Saxenda she was shown how to use this medication here in the office.  This will help with both her glucose and her weight.  Start was 0.6 mg and titrate up to 3 mg.

## 2018-11-25 NOTE — Assessment & Plan Note (Addendum)
Pressure looks okay today in the office.  She is still monitoring at home.  Prefers not to go on multiple medications that she did not see significant change in her blood pressures before and due to the side effects.

## 2018-11-26 LAB — COMPREHENSIVE METABOLIC PANEL
AG Ratio: 1.8 (calc) (ref 1.0–2.5)
ALKALINE PHOSPHATASE (APISO): 74 U/L (ref 33–115)
ALT: 11 U/L (ref 6–29)
AST: 13 U/L (ref 10–35)
Albumin: 4.4 g/dL (ref 3.6–5.1)
BILIRUBIN TOTAL: 1 mg/dL (ref 0.2–1.2)
BUN: 9 mg/dL (ref 7–25)
CALCIUM: 8.7 mg/dL (ref 8.6–10.2)
CO2: 27 mmol/L (ref 20–32)
Chloride: 105 mmol/L (ref 98–110)
Creat: 0.61 mg/dL (ref 0.50–1.10)
Globulin: 2.4 g/dL (calc) (ref 1.9–3.7)
Glucose, Bld: 106 mg/dL — ABNORMAL HIGH (ref 65–99)
Potassium: 4 mmol/L (ref 3.5–5.3)
SODIUM: 140 mmol/L (ref 135–146)
Total Protein: 6.8 g/dL (ref 6.1–8.1)

## 2018-11-26 LAB — HEMOGLOBIN A1C
Hgb A1c MFr Bld: 6.3 % of total Hgb — ABNORMAL HIGH (ref <5.7)
Mean Plasma Glucose: 134 (calc)
eAG (mmol/L): 7.4 (calc)

## 2018-12-02 ENCOUNTER — Ambulatory Visit (HOSPITAL_COMMUNITY): Payer: 59

## 2018-12-04 ENCOUNTER — Ambulatory Visit (INDEPENDENT_AMBULATORY_CARE_PROVIDER_SITE_OTHER): Payer: 59

## 2018-12-04 ENCOUNTER — Other Ambulatory Visit: Payer: Self-pay | Admitting: Family Medicine

## 2018-12-04 ENCOUNTER — Ambulatory Visit: Payer: 59 | Admitting: Obstetrics and Gynecology

## 2018-12-04 VITALS — BP 134/70 | HR 80 | Resp 16 | Ht 63.75 in | Wt 187.0 lb

## 2018-12-04 DIAGNOSIS — N83292 Other ovarian cyst, left side: Secondary | ICD-10-CM

## 2018-12-04 DIAGNOSIS — N83202 Unspecified ovarian cyst, left side: Secondary | ICD-10-CM

## 2018-12-04 DIAGNOSIS — D215 Benign neoplasm of connective and other soft tissue of pelvis: Secondary | ICD-10-CM | POA: Diagnosis not present

## 2018-12-04 DIAGNOSIS — D219 Benign neoplasm of connective and other soft tissue, unspecified: Secondary | ICD-10-CM

## 2018-12-04 DIAGNOSIS — Z1231 Encounter for screening mammogram for malignant neoplasm of breast: Secondary | ICD-10-CM

## 2018-12-04 NOTE — Progress Notes (Signed)
GYNECOLOGY  VISIT   HPI: 47 y.o.   Married  Caucasian  female   [phone] with Patient's last menstrual period was 11/25/2018.   here for pelvic US follow up of complex left ovarian cyst and known fibroids.  Had a rise in her CA125, which subsequently decreased.  She has had GYN ONC consultation.   Doing injections for weight loss.  Sadexa.   GYNECOLOGIC HISTORY: Patient's last menstrual period was 11/25/2018. Contraception:  --- Menopausal hormone therapy:   NA Last mammogram:  01/13/18 - possible right breast asymmetry.  01/20/18 - Dx right mammogram BI-RADS1.  Last pap smear:  09/12/18 - Neg, neg HR HPV.         OB History    Gravida  4   Para  1   Term  1   Preterm  0   AB  3   Living  1     SAB  0   TAB  3   Ectopic  0   Multiple  0   Live Births  1              Patient Active Problem List   Diagnosis Date Noted  . Headache 03/12/2018  . Numbness and tingling of left upper extremity 03/12/2018  . Elevated CA-125 10/21/2017  . Borderline diabetes 10/16/2017  . Epidermal cyst 07/30/2017  . OSA (obstructive sleep apnea) 04/30/2017  . Chronic hypokalemia 04/30/2017  . Obese 04/30/2017  . Subcutaneous nodule 12/03/2016  . Fibroid, uterine 08/19/2016  . History of abnormal cervical Pap smear 08/19/2016  . Snoring 10/13/2015  . Hypersomnia with sleep apnea 10/13/2015  . Menorrhagia with regular cycle 09/19/2015  . Left ovarian cyst 09/19/2015  . Essential hypertension 06/08/2015    Past Medical History:  Diagnosis Date  . Abnormal Pap smear of cervix about 2013, 2016   Colpo biopsy with normal result per pt.  colpo 2016 HPV changes  . DUB (dysfunctional uterine bleeding)    around 2012, saw gyn at the time for eval  . Elevated hemoglobin A1c   . Fibroid    multiple fibroids  . Hypertension   . Left ovarian cyst 05/2018   do yearly ultrasound.  Doing GYN ONC consulation for monitoring.   . OSA on CPAP     Past Surgical History:  Procedure  Laterality Date  . BREAST REDUCTION SURGERY  2005  . CHOLECYSTECTOMY  2004  . INCISION AND DRAINAGE Bilateral 2015   Bilateral axillary region ebaceous cyst  . REDUCTION MAMMAPLASTY      Current Outpatient Medications  Medication Sig Dispense Refill  . doxycycline (VIBRA-TABS) 100 MG tablet Take 1 tablet (100 mg total) by mouth 2 (two) times daily. 14 tablet 0  . ibuprofen (ADVIL,MOTRIN) 600 MG tablet Take 1 tablet (600 mg total) by mouth every 8 (eight) hours as needed. 30 tablet 1  . Liraglutide -Weight Management (SAXENDA) 18 MG/3ML SOPN Inject 0.6 mg into the skin daily. Titrate up as directed to 88m daily 3 pen 3  . losartan (COZAAR) 100 MG tablet Take 1 tablet (100 mg total) by mouth daily. 90 tablet 3   No current facility-administered medications for this visit.      ALLERGIES: Patient has no known allergies.  Family History  Problem Relation Age of Onset  . CVA Mother 336      deceased, was 367at the age of her first stroke  . Diabetes Father   . Deep vein thrombosis Father   .  Lupus Sister        questionable diagnosis per patient  . Breast cancer Maternal Aunt   . Breast cancer Maternal Aunt     Social History   Socioeconomic History  . Marital status: Married    Spouse name: Not on file  . Number of children: 1  . Years of education: Not on file  . Highest education level: Not on file  Occupational History  . Occupation: [employer]  Social Needs  . Financial resource strain: Not on file  . Food insecurity:    Worry: Not on file    Inability: Not on file  . Transportation needs:    Medical: Not on file    Non-medical: Not on file  Tobacco Use  . Smoking status: Never Smoker  . Smokeless tobacco: Never Used  Substance and Sexual Activity  . Alcohol use: No    Alcohol/week: 0.0 standard drinks  . Drug use: No  . Sexual activity: Yes    Birth control/protection: Other-see comments    Comment: OTC vaginal pill  Lifestyle  .  Physical activity:    Days per week: Not on file    Minutes per session: Not on file  . Stress: Not on file  Relationships  . Social connections:    Talks on phone: Not on file    Gets together: Not on file    Attends religious service: Not on file    Active member of club or organization: Not on file    Attends meetings of clubs or organizations: Not on file    Relationship status: Not on file  . Intimate partner violence:    Fear of current or ex partner: Not on file    Emotionally abused: Not on file    Physically abused: Not on file    Forced sexual activity: Not on file  Other Topics Concern  . Not on file  Social History Narrative   Work or School: Education officer, 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[employer]      Home Situation: lives with husband      Spiritual Beliefs: Baptist      Lifestyle: walks every other day; diet is bad       Review of Systems  PHYSICAL EXAMINATION:    Ht 5' 3.75" (1.619 m)   LMP 11/25/2018   BMI 32.70 kg/m     General appearance: alert, cooperative and appears stated age  Uterus with multiple fibroids, increased in number since [place] 09/2017.  Largest 27 mm.  Right ovary adherent to right uterus.  Left ovary adherent to left uterus.  Left adnexa with 52 x 22 x 26 mm tubular cystic structure with septations and echogenic fluid.   Decreased in size from office pelvic US 09/2017 when size was 64 x 38 x 47 mm. No free fluid.   ASSESSMENT  Fibroids. Left pelvic mass - today appears to be possible tubal in origin.  Previously identified as ovarian in origin.  Smaller today.  PLAN  Discussion of possible tubal origin for the left pelvic mass.  Check CA125.  Final plan to follow.    An After Visit Summary was printed and given to the patient.  __15____ minutes face to face time of which over 50% was spent in counseling.

## 2018-12-04 NOTE — Progress Notes (Signed)
Encounter reviewed by Dr. Aundria Rud.

## 2018-12-05 ENCOUNTER — Telehealth: Payer: Self-pay | Admitting: *Deleted

## 2018-12-05 ENCOUNTER — Other Ambulatory Visit (INDEPENDENT_AMBULATORY_CARE_PROVIDER_SITE_OTHER): Payer: 59

## 2018-12-05 DIAGNOSIS — N83202 Unspecified ovarian cyst, left side: Secondary | ICD-10-CM | POA: Diagnosis not present

## 2018-12-05 NOTE — Telephone Encounter (Signed)
Wait for Determination Please wait for [facility] 2017 NCPDP to return a determination.

## 2018-12-05 NOTE — Telephone Encounter (Signed)
Received request from pharmacy for [place] on Saxenda.  PA submitted.   Dx: E66.09-obesity.    The patient has tried lifestyle modification and caloric reduction x6 months without reaching desired weight loss. The requested drug is being used as an adjunct to lifestyle modification (e.g., dietary or caloric restriction, exercise, behavioral support, community based program) AND BMI greater than or equal to 30 kilograms / square meter OR (b) both of the following (i) BMI greater than or equal to 27 kilograms/ square meter AND (ii) additional disorder related to weight (high cholesterol, hypertension, diabetes, sleep apnea).  The patient has concurrent diagnosis of HTN and migraines, therefore she CANNOT USE Belviq or Qsymia.

## 2018-12-06 LAB — CA 125: CANCER ANTIGEN (CA) 125: 54.3 U/mL — AB (ref 0.0–38.1)

## 2018-12-07 ENCOUNTER — Encounter: Payer: Self-pay | Admitting: Obstetrics and Gynecology

## 2018-12-08 ENCOUNTER — Telehealth: Payer: Self-pay | Admitting: Family Medicine

## 2018-12-08 NOTE — Telephone Encounter (Signed)
Please see pror message for further details.

## 2018-12-08 NOTE — Telephone Encounter (Signed)
Pt calling to check on status of PA for saxenda.

## 2018-12-09 ENCOUNTER — Encounter: Payer: Self-pay | Admitting: Family Medicine

## 2018-12-10 ENCOUNTER — Telehealth: Payer: Self-pay | Admitting: Obstetrics and Gynecology

## 2018-12-10 NOTE — Telephone Encounter (Signed)
Phone call returned to patient.  No answer.  I left a message that I called.

## 2018-12-10 NOTE — Telephone Encounter (Signed)
Patient returning call to Dr. Quincy Simmonds.

## 2018-12-10 NOTE — Telephone Encounter (Signed)
Phone call to patient regarding lab result.  No answer.  Message left to return my call.   Her CA125 level which is now back up to 54.3. It had been elevated in a similar range and then went back down to normal.   She has seen GYN oncology and declined surgical intervention for a complex left ovarian mass.  She has been doing observational management.   On recent ultrasound, she had an increased number of fibroids and a left adnexal mass which was more suspicious for a potential hydrosalpinx or hematosalpinx.   With the elevation in the value again, I would like to have her consult with GYN oncology again for recommendations.   The complex mass has been present for many years.

## 2018-12-10 NOTE — Telephone Encounter (Signed)
Received PA determination.   PA denied. The medication is not a covered benefit and therefore are excluded from coverage.   MD please advise.

## 2018-12-10 NOTE — Telephone Encounter (Signed)
See if Victoza is covered start 0.58m, increase weekly to the max  1.823m Dx Borderline diabetes Mellitus

## 2018-12-11 MED ORDER — LIRAGLUTIDE 18 MG/3ML ~~LOC~~ SOPN: 1.8000 mg | PEN_INJECTOR | Freq: Every day | SUBCUTANEOUS | 11 refills | Status: DC

## 2018-12-11 NOTE — Telephone Encounter (Signed)
Prescription sent to pharmacy.   Patient aware per MyChart.

## 2018-12-11 NOTE — Telephone Encounter (Signed)
Patient is returning call to Dr. Quincy Simmonds. Patient is requesting a detailed message be left on her voicemail or sent through [facility].

## 2018-12-11 NOTE — Telephone Encounter (Signed)
Spoke with patient, advised as seen below per Dr. Quincy Simmonds. Advised patient I will call to schedule appt and return call with appt details. Patient request Friday appointment, no Wednesday. Patient verbalizes understanding and is agreeable.

## 2018-12-11 NOTE — Telephone Encounter (Signed)
Please contact patient about her CA125 test.  The level went back up and I would like to have her do a follow up visit with GYN ONC.

## 2018-12-11 NOTE — Telephone Encounter (Signed)
Spoke with Sharyn Lull at [facility], was advised will review schedule with Melissa and return call with appt details.

## 2018-12-12 MED ORDER — INSULIN PEN NEEDLE 32G X 4 MM MISC: 1 refills | Status: DC

## 2018-12-12 NOTE — Addendum Note (Signed)
Addended by: Sheral Flow on: 12/12/2018 09:00 AM   Modules accepted: Orders

## 2018-12-15 ENCOUNTER — Telehealth: Payer: Self-pay | Admitting: *Deleted

## 2018-12-15 NOTE — Telephone Encounter (Signed)
Sharyn Lull returned call to nurse Sharee Pimple. She said they can see the patient on January 02, 2019 at 3:30pm. She will call the patient directly.

## 2018-12-15 NOTE — Telephone Encounter (Signed)
Call to patient, confirmed patient aware of appt with Dr. Denman George. Patient was notified of appt and is agreeable to date and time.   Routing to provider for final review. Patient is agreeable to disposition. Will close encounter.

## 2018-12-15 NOTE — Telephone Encounter (Signed)
Called and scheduled the patient to see Dr. Denman George on January 3rd. Called and left Zoar at [place] office a message regarding appt

## 2018-12-29 ENCOUNTER — Telehealth: Payer: Self-pay

## 2018-12-29 DIAGNOSIS — R971 Elevated cancer antigen 125 [CA 125]: Secondary | ICD-10-CM

## 2018-12-29 NOTE — Telephone Encounter (Signed)
Incoming call from patient - wants to reschedule appt to afternoon appt later in Jan 2020 due to work.  OK per Joylene John NP- rescheduled to Jan 30th 3:45 pm and will have lab at 3:15 pm for CA 125.  Pt agreeable - no other needs per pt at this time.

## 2019-01-02 ENCOUNTER — Ambulatory Visit: Payer: 59 | Admitting: Gynecologic Oncology

## 2019-01-09 ENCOUNTER — Other Ambulatory Visit: Payer: Self-pay

## 2019-01-09 ENCOUNTER — Encounter: Payer: Self-pay | Admitting: Family Medicine

## 2019-01-09 ENCOUNTER — Ambulatory Visit: Payer: 59 | Admitting: Family Medicine

## 2019-01-09 VITALS — BP 130/64 | HR 64 | Temp 98.4°F | Resp 14 | Ht 63.75 in | Wt 179.0 lb

## 2019-01-09 DIAGNOSIS — R0981 Nasal congestion: Secondary | ICD-10-CM

## 2019-01-09 DIAGNOSIS — R7303 Prediabetes: Secondary | ICD-10-CM

## 2019-01-09 DIAGNOSIS — Z6830 Body mass index (BMI) 30.0-30.9, adult: Secondary | ICD-10-CM

## 2019-01-09 DIAGNOSIS — I10 Essential (primary) hypertension: Secondary | ICD-10-CM | POA: Diagnosis not present

## 2019-01-09 DIAGNOSIS — E6609 Other obesity due to excess calories: Secondary | ICD-10-CM | POA: Diagnosis not present

## 2019-01-09 NOTE — Progress Notes (Signed)
   Subjective:    Patient ID: Melanie Little, female    DOB: 05-Sep-1971, 48 y.o.   MRN: [id number]  Patient presents for Follow-up (is not fasting)  Currently taking Victoza for weight loss and borderline DM last A1C 6.3% in November    Weight was 189 Watching snack food and soda. Eats a lot of ice cream/chocalate, eating veggies. She does not each much fried foods Currently congested in nares, no fever had cold in December, taking robitussin and sudafed.  Has not started any exercise yet   HTN- taking losartan no side effects with the medication, BP at home improved as well    Review Of Systems:  GEN- denies fatigue, fever, weight loss,weakness, recent illness HEENT- denies eye drainage, change in vision, +nasal discharge, CVS- denies chest pain, palpitations RESP- denies SOB, cough, wheeze ABD- denies N/V, change in stools, abd pain GU- denies dysuria, hematuria, dribbling, incontinence MSK- denies joint pain, muscle aches, injury Neuro- denies headache, dizziness, syncope, seizure activity       Objective:    BP 130/64   Pulse 64   Temp 98.4 F (36.9 C) (Oral)   Resp 14   Ht 5' 3.75" (1.619 m)   Wt 179 lb (81.2 kg)   SpO2 97%   BMI 30.97 kg/m  GEN- NAD, alert and oriented x3 HEENT- PERRL, EOMI, non injected sclera, pink conjunctiva, MMM, oropharynx clear, TM clear bilat no effusion,  No maxillary sinus tenderness,+ inflammed turbinates,  Clear Nasal drainage  Neck- Supple, no LAD CVS- RRR, no murmur RESP-CTAB EXT- No edema Pulses- Radial 2+          Assessment & Plan:      Problem List Items Addressed This Visit      Unprioritized   Borderline diabetes    Borderline DM with obesity, weight down 10lbs with victoza , tolerating without side effects  Continue current medications, would like to get another 10-15 pounds off Plan to recheck A1C and labs at next visit in 3 months       Essential hypertension    Controlled no changes to losartan      Obese    Other Visit Diagnoses    Nasal congestion    -  Primary   start oral anti-histamine, flonase, no overt sinus infection      Note: This dictation was prepared with Dragon dictation along with smaller phrase technology. Any transcriptional errors that result from this process are unintentional.

## 2019-01-09 NOTE — Patient Instructions (Signed)
claritin or zyrtec flonase humidifer F/U 3 months

## 2019-01-10 ENCOUNTER — Encounter: Payer: Self-pay | Admitting: Family Medicine

## 2019-01-10 NOTE — Assessment & Plan Note (Signed)
Borderline DM with obesity, weight down 10lbs with victoza , tolerating without side effects  Continue current medications, would like to get another 10-15 pounds off Plan to recheck A1C and labs at next visit in 3 months

## 2019-01-10 NOTE — Assessment & Plan Note (Signed)
Controlled no changes to losartan

## 2019-01-15 ENCOUNTER — Ambulatory Visit
Admission: RE | Admit: 2019-01-15 | Discharge: 2019-01-15 | Disposition: A | Discharge status: Home / Self Care | Payer: 59 | Source: Ambulatory Visit | Attending: Family Medicine | Admitting: Family Medicine

## 2019-01-15 DIAGNOSIS — Z1231 Encounter for screening mammogram for malignant neoplasm of breast: Secondary | ICD-10-CM | POA: Diagnosis not present

## 2019-01-29 ENCOUNTER — Inpatient Hospital Stay (HOSPITAL_BASED_OUTPATIENT_CLINIC_OR_DEPARTMENT_OTHER): Payer: 59 | Admitting: Gynecologic Oncology

## 2019-01-29 ENCOUNTER — Encounter: Payer: Self-pay | Admitting: Gynecologic Oncology

## 2019-01-29 ENCOUNTER — Inpatient Hospital Stay: Payer: 59 | Attending: Gynecologic Oncology

## 2019-01-29 VITALS — BP 160/110 | HR 84 | Temp 98.5°F | Resp 18 | Ht 63.75 in | Wt 183.3 lb

## 2019-01-29 DIAGNOSIS — R971 Elevated cancer antigen 125 [CA 125]: Secondary | ICD-10-CM

## 2019-01-29 DIAGNOSIS — N83202 Unspecified ovarian cyst, left side: Secondary | ICD-10-CM | POA: Insufficient documentation

## 2019-01-29 NOTE — Patient Instructions (Signed)
Dr Denman George is recommending no further checks on CA 125 as this seems an unreliable marker for you. It is reasonable to continue to have an ultrasound once per year with an annual checkup. If this shows significant change in the cyst appearance, consideration should be made for its removal.

## 2019-01-29 NOTE — Progress Notes (Signed)
Follow-up Note: Gyn-Onc  Consult was initially requested by Dr. Quincy Simmonds for the evaluation of Melanie Little 48 y.o. female  CC:  Chief Complaint  Patient presents with  . left ovarian cyst    Assessment/Plan:  Melanie Little  is a 48 y.o.  year old woman with a persistent left ovarian 5-6cm ovarian cyst. It has been present and fairly stable for many years. However her CA 125 has undulated (most recently it spiked to 56). It is asymptomatic. She is not interested in surgery if I have a low suspicion for cancer.  I still suspect that the mass is most likely benign given that the [place] appearance is stable. CA 125 is unreliable in young women.  The most definitive approach would be surgical excision, and I again offered this to [place], but she is interested in close follow-up. We will follow-up today's CA 125. If it remains stable or decreased, I am recommending annual US's with Dr Quincy Simmonds to monitor the fibroids and cyst, but no more CA 125 checks. If it increases substantially, Melanie Little will have further workup with me.   Her BP is somewhat better controlled and she is continuing to see Dr Buelah Manis to continue medication management.   HPI: Melanie Little is a 48 year old woman who is seen in consultation at the request of Dr Quincy Simmonds for a 6cm ovarian cyst.   The patient has a history of uterine fibroids (minimally symptomatic). For this she was followed with serial ultrasounds since at least 2008. She has been known to have a 5-6cm left ovarian complex cyst. It has remained fairly stable on imaging over time. Most recently an [place] on 10/04/17 showed a 10x7.8x6.9cm uterus with a 44m endometrium. The left ovary measured 6.4x4.6x3.7cm with avascular smooth margins, it appears echofree, slight increase in size since 2017.  Her CA 125 was 59.4 (it had been 31 in 2016).  The patient is asymptomatic. She has hypertension and has recently experienced poor control of this and needed changes in meds. She saw  cardiology in November, 2018 who altered some medications and felt her diagnosis was most likely essential HTN.  Repeat ultrasound on 12/17/2017 showed a 12 cm fibroid uterus and normal right ovary and a left ovary measuring 6.9 x 3.7 x 4.1 cm with a complex cystic area measuring 6 x 3 x 2.1 cm with internal echoes noted this was slightly smaller in dimensions than the previous outside ultrasound report and six-month follow-up was recommended repeat Ca1 25 on 12/17/2017 had reduced down to 37.6.  Repeat [place] 06[place]19[place]18 showed a 4.3 x 3.1 x 3.8 cm complex left ovarian cyst, which could reflect a hemorrhagic cyst. This is decreased in size from previous. Given provided history, continued follow-up is warranted, suggest repeat ultrasound in 3-6 months. Small to moderate volume complex free fluid within the pelvis. Fibroid uterus. Repeat CA 125 on 06/19/18 was decreased slightly/stable at 35.7.   Her BP meds were adjusted and she is on single agent losartan. Pheo has been ruled out.  She continues to have no symptoms from the left ovarian cyst or fibroids and is not particularly interested in surgery, unless for concern of cancer.   Interval Hx: It had decreased in size since the prior ultrasound in October 2018.  There was no free fluid seen.  A Ca1 25 was drawn and had increased slightly to 54.3.  5 months earlier it had been stable and normal at 35.7. She has no concerning symptoms for ovarian cancer.  Current Meds:  Outpatient Encounter Medications as of 01/29/2019  Medication Sig  . ibuprofen (ADVIL,MOTRIN) 600 MG tablet Take 1 tablet (600 mg total) by mouth every 8 (eight) hours as needed.  . Insulin Pen Needle 32G X 4 MM MISC Use as directed to inject Victoza SQ QD.  Marland Kitchen liraglutide (VICTOZA) 18 MG/3ML SOPN Inject 0.3 mLs (1.8 mg total) into the skin daily.  Marland Kitchen losartan (COZAAR) 100 MG tablet Take 1 tablet (100 mg total) by mouth daily.   No facility-administered encounter medications on file as  of 01/29/2019.     Allergy: No Known Allergies  Social Hx:   Social History   Socioeconomic History  . Marital status: Married    Spouse name: Not on file  . Number of children: 1  . Years of education: Not on file  . Highest education level: Not on file  Occupational History  . Occupation: [employer]  Social Needs  . Financial resource strain: Not on file  . Food insecurity:    Worry: Not on file    Inability: Not on file  . Transportation needs:    Medical: Not on file    Non-medical: Not on file  Tobacco Use  . Smoking status: Never Smoker  . Smokeless tobacco: Never Used  Substance and Sexual Activity  . Alcohol use: No    Alcohol/week: 0.0 standard drinks  . Drug use: No  . Sexual activity: Yes    Birth control/protection: Other-see comments    Comment: OTC vaginal pill  Lifestyle  . Physical activity:    Days per week: Not on file    Minutes per session: Not on file  . Stress: Not on file  Relationships  . Social connections:    Talks on phone: Not on file    Gets together: Not on file    Attends religious service: Not on file    Active member of club or organization: Not on file    Attends meetings of clubs or organizations: Not on file    Relationship status: Not on file  . Intimate partner violence:    Fear of current or ex partner: Not on file    Emotionally abused: Not on file    Physically abused: Not on file    Forced sexual activity: Not on file  Other Topics Concern  . 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 BREAST REDUCTION SURGERY  2005  . CHOLECYSTECTOMY  2004  . INCISION AND DRAINAGE Bilateral 2015   Bilateral axillary region ebaceous  cyst  . REDUCTION MAMMAPLASTY      Past Medical Hx:  Past Medical History:  Diagnosis Date  . Abnormal Pap smear of cervix about 2013, 2016   Colpo biopsy with normal result per pt.  colpo 2016 HPV changes  . DUB (dysfunctional uterine bleeding)    around 2012, saw gyn at the time for eval  . Elevated hemoglobin A1c   . Fibroid    multiple fibroids  . Hypertension   . Left ovarian cyst 05/2018   do yearly ultrasound.  Doing GYN ONC consulation for monitoring.   Marland Kitchen  OSA on CPAP     Past Gynecological History:  Fibroids. Left ovarian cyst No LMP recorded.  Family Hx:  Family History  Problem Relation Age of Onset  . CVA Mother 63       deceased, was 78 at the age of her first stroke  . Diabetes Father   . Deep vein thrombosis Father   . Lupus Sister        questionable diagnosis per patient  . Breast cancer Maternal Aunt   . Breast cancer Maternal Aunt     Review of Systems:  Constitutional  Feels well,    ENT Normal appearing ears and nares bilaterally Skin/Breast  No rash, sores, jaundice, itching, dryness Cardiovascular  No chest pain, shortness of breath, or edema  Pulmonary  No cough or wheeze.  Gastro Intestinal  No nausea, vomitting, or diarrhoea. No bright red blood per rectum, no abdominal pain, change in bowel movement, or constipation.  Genito Urinary  No frequency, urgency, dysuria, menorrhagia Musculo Skeletal  No myalgia, arthralgia, joint swelling or pain  Neurologic  No weakness, numbness, change in gait,  Psychology  No depression, anxiety, insomnia.   Vitals:  Blood pressure (!) 160/110, pulse 84, temperature 98.5 F (36.9 C), temperature source Oral, resp. rate 18, height 5' 3.75" (1.619 m), weight 183 lb 5 oz (83.2 kg), SpO2 99 %.  Physical Exam: WD in NAD Neck  Supple NROM, without any enlargements.  Lymph Node Survey No cervical supraclavicular or inguinal adenopathy Cardiovascular  Pulse normal rate, regularity and rhythm. S1 and  S2 normal.  Lungs  Clear to auscultation bilateraly, without wheezes/crackles/rhonchi. Good air movement.  Skin  No rash/lesions/breakdown  Psychiatry  Alert and oriented to person, place, and time  Abdomen  Normoactive bowel sounds, abdomen soft, non-tender and obese without evidence of hernia.  Back No CVA tenderness Genito Urinary  Vulva/vagina: Normal external female genitalia.   No lesions. No discharge or bleeding.  Bladder/urethra:  No lesions or masses, well supported bladder  Vagina: normal  Cervix: Normal appearing, no lesions.  Uterus: globular,slightly enlarged, mobile, no parametrial involvement or nodularity.  Adnexa: no discrete masses. Rectal  deferred Extremities  No bilateral cyanosis, clubbing or edema.   Thereasa Solo, MD  01/29/2019, 3:55 PM

## 2019-01-30 LAB — CA 125: CANCER ANTIGEN (CA) 125: 76 U/mL — AB (ref 0.0–38.1)

## 2019-02-05 ENCOUNTER — Encounter: Payer: Self-pay | Admitting: Family Medicine

## 2019-02-12 ENCOUNTER — Telehealth: Payer: Self-pay | Admitting: Gynecologic Oncology

## 2019-02-12 DIAGNOSIS — N949 Unspecified condition associated with female genital organs and menstrual cycle: Secondary | ICD-10-CM

## 2019-02-12 DIAGNOSIS — N838 Other noninflammatory disorders of ovary, fallopian tube and broad ligament: Secondary | ICD-10-CM

## 2019-02-12 NOTE — Telephone Encounter (Signed)
Informed patient of elevation in CA 125. While this is not diagnostic of cancer, it means that I feel less comfortable about waiting 12 months for repeat imaging.   We discussed options for management including surgery now (my preference) vs CT imaging and repeat CA 125 in 6 weeks.  She is electing for the latter. We will get this scheduled for her including follow-up with me in 3 months with an ultrasound.  Everitt Amber

## 2019-02-13 ENCOUNTER — Telehealth: Payer: Self-pay | Admitting: *Deleted

## 2019-02-13 ENCOUNTER — Encounter: Payer: Self-pay | Admitting: Gynecologic Oncology

## 2019-02-13 NOTE — Telephone Encounter (Signed)
Attempted to reach the patient, no answer. Left message stating to call the office back regarding several appts.

## 2019-02-13 NOTE — Telephone Encounter (Signed)
Patient called back and was given the dates/times for her appts. Moved the CT scan from 2/20 to 2/27

## 2019-02-13 NOTE — Progress Notes (Signed)
Peer to peer performed for CT scan.  Approval obtained with auth # [id number] expires 03/30/2019

## 2019-02-18 ENCOUNTER — Ambulatory Visit (HOSPITAL_COMMUNITY): Payer: 59

## 2019-02-19 ENCOUNTER — Ambulatory Visit (HOSPITAL_COMMUNITY): Payer: 59

## 2019-02-25 ENCOUNTER — Inpatient Hospital Stay: Payer: 59 | Attending: Gynecologic Oncology

## 2019-02-25 DIAGNOSIS — R971 Elevated cancer antigen 125 [CA 125]: Secondary | ICD-10-CM | POA: Diagnosis not present

## 2019-02-25 DIAGNOSIS — N83202 Unspecified ovarian cyst, left side: Secondary | ICD-10-CM | POA: Diagnosis present

## 2019-02-25 DIAGNOSIS — N838 Other noninflammatory disorders of ovary, fallopian tube and broad ligament: Secondary | ICD-10-CM

## 2019-02-25 LAB — BASIC METABOLIC PANEL
Anion gap: 10 (ref 5–15)
BUN: 5 mg/dL — ABNORMAL LOW (ref 6–20)
CO2: 28 mmol/L (ref 22–32)
Calcium: 8.5 mg/dL — ABNORMAL LOW (ref 8.9–10.3)
Chloride: 105 mmol/L (ref 98–111)
Creatinine, Ser: 0.77 mg/dL (ref 0.44–1.00)
GFR calc Af Amer: >60 mL/min (ref >60)
Glucose, Bld: 116 mg/dL — ABNORMAL HIGH (ref 70–99)
Potassium: 3.3 mmol/L — ABNORMAL LOW (ref 3.5–5.1)
SODIUM: 143 mmol/L (ref 135–145)

## 2019-02-26 ENCOUNTER — Ambulatory Visit (HOSPITAL_COMMUNITY)
Admission: RE | Admit: 2019-02-26 | Discharge: 2019-02-26 | Disposition: A | Discharge status: Home / Self Care | Payer: 59 | Source: Ambulatory Visit | Attending: Gynecologic Oncology | Admitting: Gynecologic Oncology

## 2019-02-26 ENCOUNTER — Encounter (HOSPITAL_COMMUNITY): Payer: Self-pay

## 2019-02-26 DIAGNOSIS — N838 Other noninflammatory disorders of ovary, fallopian tube and broad ligament: Secondary | ICD-10-CM | POA: Diagnosis not present

## 2019-02-26 DIAGNOSIS — N949 Unspecified condition associated with female genital organs and menstrual cycle: Secondary | ICD-10-CM | POA: Diagnosis not present

## 2019-02-26 DIAGNOSIS — D259 Leiomyoma of uterus, unspecified: Secondary | ICD-10-CM | POA: Diagnosis not present

## 2019-02-26 MED ORDER — SODIUM CHLORIDE (PF) 0.9 % IJ SOLN
INTRAMUSCULAR | Status: AC
  Filled 2019-02-26: qty 50

## 2019-02-26 MED ORDER — IOHEXOL 300 MG/ML  SOLN
100.0000 mL | Freq: Once | INTRAMUSCULAR | Status: AC | PRN
  Administered 2019-02-26: 100 mL via INTRAVENOUS

## 2019-03-03 ENCOUNTER — Telehealth: Payer: Self-pay | Admitting: Gynecologic Oncology

## 2019-03-03 NOTE — Telephone Encounter (Signed)
Informed patient of the results of CT scan.  Feel that it is reassuring overall. Suspect the left adnexal lesion is a hydrosalpinx. Repeat CA 125 next week. If this is downwardly trending, will have her follow-up with Dr Quincy Simmonds next year for repeat US.  Thereasa Solo, MD

## 2019-03-12 DIAGNOSIS — G4733 Obstructive sleep apnea (adult) (pediatric): Secondary | ICD-10-CM | POA: Diagnosis not present

## 2019-03-13 ENCOUNTER — Inpatient Hospital Stay: Payer: 59 | Attending: Gynecologic Oncology

## 2019-03-13 ENCOUNTER — Other Ambulatory Visit: Payer: Self-pay

## 2019-03-13 DIAGNOSIS — N838 Other noninflammatory disorders of ovary, fallopian tube and broad ligament: Secondary | ICD-10-CM

## 2019-03-13 DIAGNOSIS — N83202 Unspecified ovarian cyst, left side: Secondary | ICD-10-CM | POA: Diagnosis not present

## 2019-03-13 DIAGNOSIS — R971 Elevated cancer antigen 125 [CA 125]: Secondary | ICD-10-CM | POA: Diagnosis present

## 2019-03-14 LAB — CA 125: Cancer Antigen (CA) 125: 49.4 U/mL — ABNORMAL HIGH (ref 0.0–38.1)

## 2019-03-16 ENCOUNTER — Ambulatory Visit: Payer: 59 | Admitting: Nurse Practitioner

## 2019-03-16 ENCOUNTER — Telehealth: Payer: Self-pay | Admitting: *Deleted

## 2019-03-16 ENCOUNTER — Telehealth: Payer: Self-pay

## 2019-03-16 NOTE — Telephone Encounter (Signed)
I spoke with pt to r/s her appt. She stated she would call our office back.

## 2019-03-16 NOTE — Telephone Encounter (Signed)
From: Everitt Amber, MD  Sent: 03/14/2019 11:12 PM EDT  To: Dorothyann Gibbs, NP  Subject: ca 125                      CA 125 dropped back down. Based on the CT scan, this is reassuring.  She can followup with repeat US in 12 months with Dr Quincy Simmonds.  Thanks  Terrence Dupont  ----- Message -----  From: Buel Ream, [facility] [place]  Sent: 03/14/2019  7:37 AM EDT  To: Everitt Amber, MD

## 2019-03-16 NOTE — Telephone Encounter (Signed)
Told Melanie Little the results of the CA-125 as noted below by Dr. Denman George. Pt verbalized understanding.

## 2019-04-10 ENCOUNTER — Ambulatory Visit: Payer: 59 | Admitting: Family Medicine

## 2019-04-14 ENCOUNTER — Ambulatory Visit: Payer: 59 | Admitting: Family Medicine

## 2019-04-15 ENCOUNTER — Ambulatory Visit: Payer: 59 | Admitting: Family Medicine

## 2019-04-17 ENCOUNTER — Other Ambulatory Visit: Payer: Self-pay

## 2019-04-17 ENCOUNTER — Ambulatory Visit (INDEPENDENT_AMBULATORY_CARE_PROVIDER_SITE_OTHER): Payer: 59 | Admitting: Family Medicine

## 2019-04-17 DIAGNOSIS — Z6832 Body mass index (BMI) 32.0-32.9, adult: Secondary | ICD-10-CM | POA: Diagnosis not present

## 2019-04-17 DIAGNOSIS — R7303 Prediabetes: Secondary | ICD-10-CM | POA: Diagnosis not present

## 2019-04-17 DIAGNOSIS — I10 Essential (primary) hypertension: Secondary | ICD-10-CM | POA: Diagnosis not present

## 2019-04-17 DIAGNOSIS — E6609 Other obesity due to excess calories: Secondary | ICD-10-CM | POA: Diagnosis not present

## 2019-04-17 MED ORDER — LOSARTAN POTASSIUM 100 MG PO TABS: 100.0000 mg | ORAL_TABLET | Freq: Every day | ORAL | 3 refills | Status: DC

## 2019-04-17 NOTE — Progress Notes (Signed)
Virtual Visit via Video Note  I connected with Melanie Little on 04/19/19 at 11:30 AM EDT by a video enabled telemedicine application and verified that I am speaking with the correct person using two identifiers.  Pt location: at home  Physician location: in office, [place], Vic Blackbird MD  I discussed the limitations of evaluation and management by telemedicine and the availability of in person appointments. The patient expressed understanding and agreed to proceed.  History of Present Illness: Obesity- last visit 179lbs taking Victoza to assist with weight loss in setting of pre diabetes with last A1C 6.3%, no SE with medication but now costing her $60 a month with copay card, insurance states she has other options. She states she does not eat very much, often light breakfast such as fruit or yogurt, no lunch, then very hungry for dinner and often not very healthy foods. She does walk almost daily. States her weight is down 188lbs but no real change in diet, drinks mostly water.   HTN- taking losartan 116m daily, BP during visit was 145/82 , no SE with medication   Observations/Objective:  No acute distress , alert and oriented   Normal WOB noted   Assessment and Plan: HTN- no change to losartan, continue to monitor BP at home, has not been doing regulary  Obesity- discussed adding more protein, small meal at lunch so she doesn't overeat at dinner, continue with daily walking, after looking at alternatives insurance will cover, will stick with victoza at this time  Prediabetes, plan to recheck labs in a few month,  Follow Up Instructions:    I discussed the assessment and treatment plan with the patient. The patient was provided an opportunity to ask questions and all were answered. The patient agreed with the plan and demonstrated an understanding of the instructions.   The patient was advised to call back or seek an in-person evaluation if the symptoms worsen  or if the condition fails to improve as anticipated.  I provided 25 minutes of non-face-to-face time during this encounter. End time 11:55   KVic Blackbird MD

## 2019-04-19 ENCOUNTER — Encounter: Payer: Self-pay | Admitting: Family Medicine

## 2019-05-18 ENCOUNTER — Ambulatory Visit (HOSPITAL_COMMUNITY)
Admission: RE | Admit: 2019-05-18 | Discharge: 2019-05-18 | Disposition: A | Discharge status: Home / Self Care | Payer: 59 | Source: Ambulatory Visit | Attending: Gynecologic Oncology | Admitting: Gynecologic Oncology

## 2019-05-18 ENCOUNTER — Inpatient Hospital Stay: Payer: 59 | Attending: Gynecologic Oncology

## 2019-05-18 ENCOUNTER — Other Ambulatory Visit: Payer: Self-pay

## 2019-05-18 DIAGNOSIS — N838 Other noninflammatory disorders of ovary, fallopian tube and broad ligament: Secondary | ICD-10-CM

## 2019-05-18 DIAGNOSIS — R971 Elevated cancer antigen 125 [CA 125]: Secondary | ICD-10-CM | POA: Insufficient documentation

## 2019-05-18 DIAGNOSIS — N83202 Unspecified ovarian cyst, left side: Secondary | ICD-10-CM | POA: Diagnosis present

## 2019-05-19 ENCOUNTER — Telehealth: Payer: Self-pay | Admitting: *Deleted

## 2019-05-19 LAB — CA 125: Cancer Antigen (CA) 125: 79.3 U/mL — ABNORMAL HIGH (ref 0.0–38.1)

## 2019-05-19 NOTE — Telephone Encounter (Signed)
Caled and changed the appt on Friday to a virtual visit

## 2019-05-20 ENCOUNTER — Ambulatory Visit: Payer: 59 | Admitting: Gynecologic Oncology

## 2019-05-22 ENCOUNTER — Encounter: Payer: Self-pay | Admitting: Gynecologic Oncology

## 2019-05-22 ENCOUNTER — Inpatient Hospital Stay (HOSPITAL_BASED_OUTPATIENT_CLINIC_OR_DEPARTMENT_OTHER): Payer: 59 | Admitting: Gynecologic Oncology

## 2019-05-22 DIAGNOSIS — N83202 Unspecified ovarian cyst, left side: Secondary | ICD-10-CM | POA: Diagnosis not present

## 2019-05-22 DIAGNOSIS — R971 Elevated cancer antigen 125 [CA 125]: Secondary | ICD-10-CM

## 2019-05-22 NOTE — Patient Instructions (Signed)
Follow-up with Dr Quincy Simmonds annually for wellness visit. Annual ultrasound is recommended to monitor fibroids and cyst. CA 125 is not recommended given that it is persistently mildly elevated and not informative.

## 2019-05-22 NOTE — Progress Notes (Signed)
Gynecologic Oncology Telehealth Follow-up Note: Gyn-Onc  I connected with Melanie Little on 05/22/19 at  3:30 PM EDT by telephone and verified that I am speaking with the correct person using two identifiers.  I discussed the limitations, risks, security and privacy concerns of performing an evaluation and management service by telemedicine and the availability of in-person appointments. I also discussed with the patient that there may be a patient responsible charge related to this service. The patient expressed understanding and agreed to proceed.  Other persons participating in the visit and their role in the encounter: none.  Patient's location: home Provider's location: [place]   Chief Complaint:  Chief Complaint  Patient presents with  . Ovarian Cyst    follow-up    Assessment/Plan:  Ms. Melanie Little  is a 48 y.o.  year old woman with a persistent left ovarian 5-6cm ovarian cyst. It has been present and fairly stable for many years. However her CA 125 has undulated. It is asymptomatic. She is not interested in surgery if I have a low suspicion for cancer.  I still suspect that the mass is most likely benign given that the [place] appearance is stable. CA 125 is unreliable in young women with benign pathology.  The most definitive approach would be surgical excision, and I again offered this to [place], but she is interested in close follow-up. I think this is reasonable given its stable appearance and the likelihood that it is benign.   If it remains stable or decreased, I am recommending annual US's with Dr Quincy Simmonds to monitor the fibroids and cyst, but no more CA 125 checks. If it increases substantially (>50%), Melanie Little will have further workup with me.   HPI: Melanie Little is a 48 year old woman who is seen in consultation at the request of Dr Quincy Simmonds for a 6cm ovarian cyst.   The patient has a history of uterine fibroids (minimally symptomatic). For this she was  followed with serial ultrasounds since at least 2008. She has been known to have a 5-6cm left ovarian complex cyst. It has remained fairly stable on imaging over time. Most recently an [place] on 10/04/17 showed a 10x7.8x6.9cm uterus with a 69m endometrium. The left ovary measured 6.4x4.6x3.7cm with avascular smooth margins, it appears echofree, slight increase in size since 2017.  Her CA 125 was 59.4 (it had been 31 in 2016).  The patient is asymptomatic. She has hypertension and has recently experienced poor control of this and needed changes in meds. She saw cardiology in November, 2018 who altered some medications and felt her diagnosis was most likely essential HTN.  Repeat ultrasound on 12/17/2017 showed a 12 cm fibroid uterus and normal right ovary and a left ovary measuring 6.9 x 3.7 x 4.1 cm with a complex cystic area measuring 6 x 3 x 2.1 cm with internal echoes noted this was slightly smaller in dimensions than the previous outside ultrasound report and six-month follow-up was recommended repeat Ca1 25 on 12/17/2017 had reduced down to 37.6.  Repeat [place] 06[place]19/18 showed a 4.3 x 3.1 x 3.8 cm complex left ovarian cyst, which could reflect a hemorrhagic cyst. This is decreased in size from previous. Given provided history, continued follow-up is warranted, suggest repeat ultrasound in 3-6 months. Small to moderate volume complex free fluid within the pelvis. Fibroid uterus. Repeat CA 125 on 06/19/18 was decreased slightly/stable at 35.7.   Her BP meds were adjusted and she is on single agent losartan. Pheo has  been ruled out.  She continues to have no symptoms from the left ovarian cyst or fibroids and is not particularly interested in surgery, unless for concern of cancer.   It had decreased in size since the prior ultrasound in October 2018.  There was no free fluid seen.  A Ca1 25 was drawn and had increased slightly to 54.3.  5 months earlier it had been stable and normal at 35.7. She has no  concerning symptoms for ovarian cancer. Repeat CA 125 on 01/29/19 was elevated to 76.   CT abd/pelvis was ordered in February, 2020 and revealed 5 cm cystic lesion in left adnexa with thin septations and somewhat tubular appearance. Differential diagnosis includes cystic ovarian neoplasm and hydrosalpinx. Consider pelvic MRI without and with contrast for further characterization. Multiple uterine fibroids, largest measuring 3.5 cm. Mild hepatic steatosis.  CA 125 was rechecked and was reduced to 49.4 and therefore a decision was made for continued expectant follow-up and repeat imaging in May, 2020.  Interval Hx: On 05/18/19 CA 125 was 79 and US showed a uterus with small fibroids in total measuring 11.9 x 7.6 x 9.1 cm.  A right ovary measuring 3.8 x 4.6 x 2.3 cm.  The left ovary measured 6.4 x 3 x 3.8 cm and included a 2.7 x 3.4 cm cyst with mildly irregular margins but no mural nodularity that appeared benign and a 2.9 x 2.7 cm cyst with suspected thin septations no mural nodularity favor benign.  Despite the mildly elevated Ca1 25 these reassuringly stable signs on ultrasound were consistent with a benign process   Melanie Little continues to be asymptomatic.     Current Meds:  Outpatient Encounter Medications as of 05/22/2019  Medication Sig  . ibuprofen (ADVIL,MOTRIN) 600 MG tablet Take 1 tablet (600 mg total) by mouth every 8 (eight) hours as needed.  . Insulin Pen Needle 32G X 4 MM MISC Use as directed to inject Victoza SQ QD.  Marland Kitchen liraglutide (VICTOZA) 18 MG/3ML SOPN Inject 0.3 mLs (1.8 mg total) into the skin daily.  Marland Kitchen losartan (COZAAR) 100 MG tablet Take 1 tablet (100 mg total) by mouth daily.   No facility-administered encounter medications on file as of 05/22/2019.     Allergy: No Known Allergies  Social Hx:   Social History   Socioeconomic History  . Marital status: Married    Spouse name: Not on file  . Number of children: 1  . Years of education: Not on file  . Highest education  level: Not on file  Occupational History  . Occupation: [employer]  Social Needs  . Financial resource strain: Not on file  . Food insecurity:    Worry: Not on file    Inability: Not on file  . Transportation needs:    Medical: Not on file    Non-medical: Not on file  Tobacco Use  . Smoking status: Never Smoker  . Smokeless tobacco: Never Used  Substance and Sexual Activity  . Alcohol use: No    Alcohol/week: 0.0 standard drinks  . Drug use: No  . Sexual activity: Yes    Birth control/protection: Other-see comments    Comment: OTC vaginal pill  Lifestyle  . Physical activity:    Days per week: Not on file    Minutes per session: Not on file  . Stress: Not on file  Relationships  . Social connections:    Talks on phone: Not on file    Gets together: Not on file  Attends religious service: Not on file    Active member of club or organization: Not on file    Attends meetings of clubs or organizations: Not on file    Relationship status: Not on file  . Intimate partner violence:    Fear of current or ex partner: Not on file    Emotionally abused: Not on file    Physically abused: Not on file    Forced sexual activity: Not on file  Other Topics Concern  . Not on file  Social History Narrative   Work or School: Education officer, 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[employer]      Home Situation: lives with husband      Spiritual Beliefs: [employer]      Lifestyle: walks every other day; diet is bad       Past Surgical Hx:  Past Surgical History:  Procedure Laterality Date  . BREAST REDUCTION SURGERY  2005  . CHOLECYSTECTOMY  2004  . INCISION AND DRAINAGE Bilateral 2015   Bilateral axillary region ebaceous cyst  . REDUCTION MAMMAPLASTY      Past Medical Hx:  Past Medical History:  Diagnosis Date  . Abnormal Pap smear of cervix about 2013, 2016   Colpo biopsy with normal result per pt.  colpo 2016 HPV changes  . DUB (dysfunctional uterine  bleeding)    around 2012, saw gyn at the time for eval  . Elevated hemoglobin A1c   . Fibroid    multiple fibroids  . Hypertension   . Left ovarian cyst 05/2018   do yearly ultrasound.  Doing GYN ONC consulation for monitoring.   . OSA on CPAP     Past Gynecological History:  Fibroids. Left ovarian cyst No LMP recorded.  Family Hx:  Family History  Problem Relation Age of Onset  . CVA Mother 32       deceased, was 48 at the age of her first stroke  . Diabetes Father   . Deep vein thrombosis Father   . Lupus Sister        questionable diagnosis per patient  . Breast cancer Maternal Aunt   . Breast cancer Maternal Aunt     Review of Systems:  Constitutional  Feels well,    ENT Normal appearing ears and nares bilaterally Skin/Breast  No rash, sores, jaundice, itching, dryness Cardiovascular  No chest pain, shortness of breath, or edema  Pulmonary  No cough or wheeze.  Gastro Intestinal  No nausea, vomitting, or diarrhoea. No bright red blood per rectum, no abdominal pain, change in bowel movement, or constipation.  Genito Urinary  No frequency, urgency, dysuria, menorrhagia Musculo Skeletal  No myalgia, arthralgia, joint swelling or pain  Neurologic  No weakness, numbness, change in gait,  Psychology  No depression, anxiety, insomnia.   Vitals:  There were no vitals taken for this visit.  Physical Exam: Deferred due to telehealth visit.  I discussed the assessment and treatment plan with the patient. The patient was provided with an opportunity to ask questions and all were answered. The patient agreed with the plan and demonstrated an understanding of the instructions.   The patient was advised to call back or see an in-person evaluation if the symptoms worsen or if the condition fails to improve as anticipated.   I provided 10 minutes of face-to-face video visit time during this encounter, and > 50% was spent counseling as documented under my assessment &  plan.    Thereasa Solo, MD  05/22/2019,  3:33 PM

## 2019-06-02 ENCOUNTER — Other Ambulatory Visit: Payer: Self-pay

## 2019-06-02 ENCOUNTER — Other Ambulatory Visit: Payer: 59

## 2019-06-02 DIAGNOSIS — E6609 Other obesity due to excess calories: Secondary | ICD-10-CM

## 2019-06-02 DIAGNOSIS — I10 Essential (primary) hypertension: Secondary | ICD-10-CM

## 2019-06-02 DIAGNOSIS — R7303 Prediabetes: Secondary | ICD-10-CM

## 2019-06-03 LAB — LIPID PANEL
Cholesterol: 179 mg/dL (ref <200)
HDL: 57 mg/dL (ref >=50)
LDL Cholesterol (Calc): 95 mg/dL (calc)
Non-HDL Cholesterol (Calc): 122 mg/dL (calc) (ref <130)
Total CHOL/HDL Ratio: 3.1 (calc) (ref <5.0)
Triglycerides: 176 mg/dL — ABNORMAL HIGH (ref <150)

## 2019-06-03 LAB — COMPREHENSIVE METABOLIC PANEL
AG Ratio: 1.7 (calc) (ref 1.0–2.5)
ALT: 13 U/L (ref 6–29)
AST: 14 U/L (ref 10–35)
Albumin: 4.2 g/dL (ref 3.6–5.1)
Alkaline phosphatase (APISO): 66 U/L (ref 31–125)
BUN: 8 mg/dL (ref 7–25)
CO2: 27 mmol/L (ref 20–32)
Calcium: 8.7 mg/dL (ref 8.6–10.2)
Chloride: 103 mmol/L (ref 98–110)
Creat: 0.69 mg/dL (ref 0.50–1.10)
Globulin: 2.5 g/dL (calc) (ref 1.9–3.7)
Glucose, Bld: 118 mg/dL — ABNORMAL HIGH (ref 65–99)
Potassium: 4.5 mmol/L (ref 3.5–5.3)
Sodium: 138 mmol/L (ref 135–146)
Total Bilirubin: 0.7 mg/dL (ref 0.2–1.2)
Total Protein: 6.7 g/dL (ref 6.1–8.1)

## 2019-06-03 LAB — CBC WITH DIFFERENTIAL/PLATELET
Absolute Monocytes: 810 cells/uL (ref 200–950)
Basophils Absolute: 71 cells/uL (ref 0–200)
Basophils Relative: 0.8 %
Eosinophils Absolute: 169 cells/uL (ref 15–500)
Eosinophils Relative: 1.9 %
HCT: 39.2 % (ref 35.0–45.0)
Hemoglobin: 12.4 g/dL (ref 11.7–15.5)
Lymphs Abs: 2679 cells/uL (ref 850–3900)
MCH: 22.1 pg — ABNORMAL LOW (ref 27.0–33.0)
MCHC: 31.6 g/dL — ABNORMAL LOW (ref 32.0–36.0)
MCV: 70 fL — ABNORMAL LOW (ref 80.0–100.0)
MPV: 9.9 fL (ref 7.5–12.5)
Monocytes Relative: 9.1 %
Neutro Abs: 5171 cells/uL (ref 1500–7800)
Neutrophils Relative %: 58.1 %
Platelets: 428 10*3/uL — ABNORMAL HIGH (ref 140–400)
RBC: 5.6 10*6/uL — ABNORMAL HIGH (ref 3.80–5.10)
RDW: 16.5 % — ABNORMAL HIGH (ref 11.0–15.0)
Total Lymphocyte: 30.1 %
WBC: 8.9 10*3/uL (ref 3.8–10.8)

## 2019-06-03 LAB — HEMOGLOBIN A1C
Hgb A1c MFr Bld: 5.8 % of total Hgb — ABNORMAL HIGH (ref <5.7)
Mean Plasma Glucose: 120 (calc)
eAG (mmol/L): 6.6 (calc)

## 2019-06-17 ENCOUNTER — Encounter: Payer: Self-pay | Admitting: Family Medicine

## 2019-06-17 MED ORDER — SULFAMETHOXAZOLE-TRIMETHOPRIM 800-160 MG PO TABS: 1.0000 | ORAL_TABLET | Freq: Two times a day (BID) | ORAL | 0 refills | Status: AC

## 2019-07-27 ENCOUNTER — Encounter: Payer: Self-pay | Admitting: Neurology

## 2019-07-28 ENCOUNTER — Ambulatory Visit: Payer: 59 | Admitting: Neurology

## 2019-07-28 ENCOUNTER — Encounter: Payer: Self-pay | Admitting: Neurology

## 2019-07-28 ENCOUNTER — Other Ambulatory Visit: Payer: Self-pay

## 2019-07-28 VITALS — BP 162/106 | HR 87 | Temp 98.0°F | Ht 64.0 in | Wt 190.0 lb

## 2019-07-28 DIAGNOSIS — R51 Headache: Secondary | ICD-10-CM

## 2019-07-28 DIAGNOSIS — R519 Headache, unspecified: Secondary | ICD-10-CM

## 2019-07-28 DIAGNOSIS — R0683 Snoring: Secondary | ICD-10-CM | POA: Diagnosis not present

## 2019-07-28 DIAGNOSIS — G4733 Obstructive sleep apnea (adult) (pediatric): Secondary | ICD-10-CM | POA: Diagnosis not present

## 2019-07-28 NOTE — Progress Notes (Signed)
[facility]  PATIENT: Treana Bonus DOB: [birth year]   REASON FOR VISIT: Follow-up for obstructive sleep apnea with CPAP HISTORY FROM: Patient   HISTORY OF PRESENT ILLNESS: Melanie Little is  a married African-American and left-handed female, presents today with a concern of excessive daytime fatigue and associated poor and nonrestorative sleep. She is especially concerned because her ability to focus seems to be impaired and sometimes she appears more forgetful than she could attribute to age or any other medical factors. She endorsed today the fatigue severity scale at 43 points and the Epworth sleepiness score at 9 points her husband has told her that she snores. Her husband now also has noted her to have apneas. He witnessed some. She feels that she doesn't get enough sleep and not restorative sleep in general. Risk factors are recent weight gain more than 20 pounds over the last 24 month. When she traveled with her sister last year, the to women share the bedroom. Her snoring was recorded by her roommate. Her cardiologist also advised her that her blood pressure was not responding to several drugs as well that this may be related to an underlying condition of obstructive sleep apnea. She spoke to her primary care physician, . Blima Singer, DO  to be referred here to [facility] since her husband is also a patient here. 3 years ago , while the patient lived in [place], her primary care physician Dr. Teryl Lucy ordered a home sleep test she brought me a copy of the results. She was actually not diagnosed with significant apnea in Summer 2013 her AHI was 3 and her RDI was 9. He listed the following diagnosis in her history; excessive daytime sleepiness, essential hypertension on more than 2 drugs, she does not have a history of congestive heart disease, diabetes or cardiovascular disease.  Sleep habits are as follows: The patient onset my question about her  preferred bedtime as "anytime I can".  She returns from work between 5:30 and 6 PM and often likes to take a nap to get refreshed. These naps last 1 hour. She does not recall any dreams during naps. She fights dozing off in the evening hours and has sometimes trouble to stay awake watching a TV show for example. She usually transfers to the bedroom between 8:30 and 9 PM, she likes to watch TV in the bedroom. She will switch the TV off before falling asleep. The bedroom is otherwise cool, quiet and dark. She shares the bed with her husband. She has to go to the bathroom to urinate 5-6 times each night fragmenting her sleep. She estimates getting less than 5 hours of sleep and an average night. She does recall dreaming at nighttime. No nightmares and not specifically vivid dreams were reported. She rises at 6:50 AM . Usually listens to the news before she gets up, she wakes up spontaneously before the alarm rings. She sleeps on one pillow and prefers sleeping on the side, she does not have a sleep problem. When she wakes up she usually is still in the lateral recumbent position. Her breakfast is usually to go smoothly or yogurt she does not drink coffee or caffeine at it beverages at all. She drinks water and juice. At work she is without any access to natural daylight, working  in office. 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 Interval history on 03/04/2017,CD Mrs. Silverthorn is here today with an excellent compliance report of 97%, fatigue  severity reduced to 15 points from previously 25, Epworth sleepiness score is endorsed at 0 points. Continued air leaks, but mask is more comfortable. Snores when mask dislodges, dry mouth.  Explained how to set humidifier.  UPDATE 3/13/2019CM  Ms. Krummel, 48 year old female returns for follow-up with history of obstructive sleep apnea on CPAP.  She also has new complaint today of headache that usually occurs during her  menstrual cycle but does not go away afterwards.  It is a dull headache without precipitating factors.  She has been taking Excedrin Migraine with little benefit.  She also complains of intermittent numbness mostly on the left side.  There is a family history of stroke in her mother.  She herself is hypertensive and is on 3 blood pressure medications.  CPAP compliance dated 02/10/2018-03/11/2018 shows compliance greater than 4 hours at 100%.  Average usage 7 hours 44 minutes.  Set pressure 6 cm.  EPR level 2.  AHI 3.2. ESS 0   Updated 07-28-2019, patient reports stiff neck, no stuffy nose and  Good compliance with CPAP.  I have the pleasure of meeting today with Shaylinn Hladik, a 48 year old left handed AAF with OSA on CPAP. She endorsed the Epworth sleepiness score at 1 out of 24 possible points, fatigue severity at 20.  Her compliance to CPAP is excellent she has used the CPAP every of the last 30 days, with an average of 8 hours and 3 minutes set pressure is 6 cmH2O was 2 cm EPR, but the residual AHI has crept up to 5.5 which is higher than before.  She does have a significant amount of air leakage and apneas are obstructive in nature.  There are some unknown type apneas that I attributed to air leak.  However with the excellent compliance and the low Epworth Sleepiness Scale I think that we do not necessarily have to make changes unless the patient would like to try a different mask or interface. Her machine was issued October 2016.  She has a history of HTN uncontrolled on 4 medications and was evaluated for various causes. No renal artery stenosis. Now on one medication and doing better.     REVIEW OF SYSTEMS: Full 14 system review of systems performed and notable only for those listed, all others are neg:  Musculoskeletal: Neck pain neck stiffness Neurological: No headache, numbness Sleep : Obstructive sleep apnea on CPAP HTN Sleep interruption due to being on call-  night shift worker history.   Ovarian cysts and heavy menstrual bleeds- fibromas.   ALLERGIES: No Known Allergies  HOME MEDICATIONS: Outpatient Medications Prior to Visit  Medication Sig Dispense Refill  . losartan (COZAAR) 100 MG tablet Take 1 tablet (100 mg total) by mouth daily. 90 tablet 3  . ibuprofen (ADVIL,MOTRIN) 600 MG tablet Take 1 tablet (600 mg total) by mouth every 8 (eight) hours as needed. 30 tablet 1  . Insulin Pen Needle 32G X 4 MM MISC Use as directed to inject Victoza SQ QD. 100 each 1  . liraglutide (VICTOZA) 18 MG/3ML SOPN Inject 0.3 mLs (1.8 mg total) into the skin daily. 3 pen 11   No facility-administered medications prior to visit.     PAST MEDICAL HISTORY: Past Medical History:  Diagnosis Date  . Abnormal Pap smear of cervix about 2013, 2016   Colpo biopsy with normal result per pt.  colpo 2016 HPV changes  . DUB (dysfunctional uterine bleeding)    around 2012, saw gyn at the time for eval  . Elevated  hemoglobin A1c   . Fibroid    multiple fibroids  . Hypertension   . Left ovarian cyst 05/2018   do yearly ultrasound.  Doing GYN ONC consulation for monitoring.   . OSA on CPAP     PAST SURGICAL HISTORY: Past Surgical History:  Procedure Laterality Date  . BREAST REDUCTION SURGERY  2005  . CHOLECYSTECTOMY  2004  . INCISION AND DRAINAGE Bilateral 2015   Bilateral axillary region ebaceous cyst  . REDUCTION MAMMAPLASTY      FAMILY HISTORY: Family History  Problem Relation Age of Onset  . CVA Mother 20       deceased, was 19 at the age of her first stroke  . Diabetes Father   . Deep vein thrombosis Father   . Lupus Sister        questionable diagnosis per patient  . Breast cancer Maternal Aunt   . Breast cancer Maternal Aunt     SOCIAL HISTORY: Social History   Socioeconomic History  . Marital status: Married    Spouse name: Not on file  . Number of children: 1  . Years of education: Not on file  . Highest education level: Not on file  Occupational History  .  Occupation: [employer]  Social Needs  . Financial resource strain: Not on file  . Food insecurity    Worry: Not on file    Inability: Not on file  . Transportation needs    Medical: Not on file    Non-medical: Not on file  Tobacco Use  . Smoking status: Never Smoker  . Smokeless tobacco: Never Used  Substance and Sexual Activity  . Alcohol use: No    Alcohol/week: 0.0 standard drinks  . Drug use: No  . Sexual activity: Yes    Birth control/protection: Other-see comments    Comment: OTC vaginal pill  Lifestyle  . Physical activity    Days per week: Not on file    Minutes per session: Not on file  . Stress: Not on file  Relationships  . Social Herbalist on phone: Not on file    Gets together: Not on file    Attends religious service: Not on file    Active member of club or organization: Not on file    Attends meetings of clubs or organizations: Not on file    Relationship status: Not on file  . Intimate partner violence    Fear of current or ex partner: Not on file    Emotionally abused: Not on file    Physically abused: Not on file    Forced sexual activity: Not on file  Other Topics Concern  . Not on file  Social History Narrative   Work or School: Education officer, 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[employer]      Home Situation: lives with husband      Spiritual Beliefs: Baptist      Lifestyle: walks every other day; diet is bad        PHYSICAL EXAM  Vitals:   07/28/19 1454  BP: (!) 162/106  Pulse: 87  Temp: 98 F (36.7 C)  Weight: 190 lb (86.2 kg)  Height: 5' 4"  (1.626 m)   Body mass index is 32.61 kg/m.  Generalized: Well developed, morbidly obese female in no acute distress  Head: normocephalic and atraumatic,. Oropharynx benign  Neck: Supple, no carotid bruits  Cardiac: Regular rate rhythm, no murmur  Musculoskeletal: No deformity   Neurological examination  Mentation: Alert oriented to time, place, history  taking. Attention span and concentration appropriate. Recent and remote memory intact.  Follows all commands speech and language fluent.   Cranial nerve II-XII: .Pupils were equal round reactive to light extraocular movements were full, visual field were full on confrontational test. Facial sensation and strength were normal. hearing was intact to finger rubbing bilaterally. Uvula tongue midline. head turning and shoulder shrug were normal and symmetric.Tongue protrusion into cheek strength was normal. Motor: normal bulk and tone, full strength in the BUE, BLE, fine finger movements normal, no pronator drift. No focal weakness Sensory: normal and symmetric to light touch, pinprick, and  Vibration, in the upper and lower extremities Coordination: finger-nose-finger, heel-to-shin bilaterally, no dysmetria Reflexes: Symmetric upper and lower , plantar responses were flexor bilaterally. Gait and Station: Rising up from seated position without assistance, normal stance,  moderate stride, good arm swing, smooth turning, able to perform tiptoe, and heel walking without difficulty. Tandem gait is steady  DIAGNOSTIC DATA (LABS, IMAGING, TESTING) - I reviewed patient records, labs, notes, testing and imaging myself where available.  Lab Results  Component Value Date   WBC 8.9 06/02/2019   HGB 12.4 06/02/2019   HCT 39.2 06/02/2019   MCV 70.0 (L) 06/02/2019   PLT 428 (H) 06/02/2019      Component Value Date/Time   NA 138 06/02/2019 0905   NA 139 11/04/2017 1028   K 4.5 06/02/2019 0905   CL 103 06/02/2019 0905   CO2 27 06/02/2019 0905   GLUCOSE 118 (H) 06/02/2019 0905   BUN 8 06/02/2019 0905   BUN 11 11/04/2017 1028   CREATININE 0.69 06/02/2019 0905   CALCIUM 8.7 06/02/2019 0905   PROT 6.7 06/02/2019 0905   ALBUMIN 4.1 04/30/2017 1529   AST 14 06/02/2019 0905   ALT 13 06/02/2019 0905   ALKPHOS 72 04/30/2017 1529   BILITOT 0.7 06/02/2019 0905   GFRNONAA >60 02/25/2019 1420   GFRNONAA >89  04/30/2017 1529   GFRAA >60 02/25/2019 1420   GFRAA >89 04/30/2017 1529   Lab Results  Component Value Date   CHOL 179 06/02/2019   HDL 57 06/02/2019   LDLCALC 95 06/02/2019   TRIG 176 (H) 06/02/2019   CHOLHDL 3.1 06/02/2019   Lab Results  Component Value Date   HGBA1C 5.8 (H) 06/02/2019   Lab Results  Component Value Date   POEUMPNT61 443 04/30/2017   Lab Results  Component Value Date   TSH 2.80 05/14/2018      ASSESSMENT AND PLAN  48 y.o. year old female  has a past medical history of  Hypertension, air leaks on CPAP-  OSA on CPAP. here to follow-up for CPAP compliance.    PLAN: CPAP compliance 100 percent Blood pressure elevated on 3 anti-hypertensive drugs, continue to follow-up with primary care regarding blood pressure treatment Worsening headache and paresthesias mostly on the left side we will check MRI of the brain for abnormalities ,demyelinating disease Will call with results Follow-up yearly for CPAP compliance- the patient reports some ai rleaks related to her hairstyle. I offered a bella swift but she doesn't like l nasal pillows.     Larey Seat, MD   07-28-2019   [facility] [address], [place], [place] [address] [phone]

## 2019-07-28 NOTE — Patient Instructions (Signed)

## 2019-07-31 ENCOUNTER — Other Ambulatory Visit: Payer: Self-pay | Admitting: Family Medicine

## 2019-09-08 ENCOUNTER — Ambulatory Visit: Payer: Self-pay | Admitting: Family Medicine

## 2019-09-24 ENCOUNTER — Other Ambulatory Visit: Payer: Self-pay

## 2019-09-25 ENCOUNTER — Encounter: Payer: Self-pay | Admitting: Obstetrics and Gynecology

## 2019-09-25 ENCOUNTER — Ambulatory Visit: Payer: 59 | Admitting: Obstetrics and Gynecology

## 2019-09-25 ENCOUNTER — Other Ambulatory Visit (HOSPITAL_COMMUNITY)
Admission: RE | Admit: 2019-09-25 | Discharge: 2019-09-25 | Disposition: A | Discharge status: Home / Self Care | Payer: 59 | Source: Ambulatory Visit | Attending: Obstetrics and Gynecology | Admitting: Obstetrics and Gynecology

## 2019-09-25 VITALS — BP 122/82 | HR 76 | Temp 97.1°F | Resp 12 | Ht 64.0 in | Wt 190.0 lb

## 2019-09-25 DIAGNOSIS — Z01419 Encounter for gynecological examination (general) (routine) without abnormal findings: Secondary | ICD-10-CM | POA: Diagnosis not present

## 2019-09-25 DIAGNOSIS — Z124 Encounter for screening for malignant neoplasm of cervix: Secondary | ICD-10-CM | POA: Insufficient documentation

## 2019-09-25 DIAGNOSIS — Z87410 Personal history of cervical dysplasia: Secondary | ICD-10-CM | POA: Insufficient documentation

## 2019-09-25 DIAGNOSIS — D219 Benign neoplasm of connective and other soft tissue, unspecified: Secondary | ICD-10-CM

## 2019-09-25 DIAGNOSIS — N83202 Unspecified ovarian cyst, left side: Secondary | ICD-10-CM

## 2019-09-25 MED ORDER — NORETHINDRONE 0.35 MG PO TABS: 1.0000 | ORAL_TABLET | Freq: Every day | ORAL | 3 refills | Status: DC

## 2019-09-25 NOTE — Progress Notes (Signed)
48 y.o. [id number] Married [place] American female here for annual exam.    Patient saw Dr. Denman George, GYN ONC, who recommended she do yearly pelvic US and no further CA125 checks regarding her large left ovarian cyst.  If she has significant increase in the size of the cyst, she will return for surgical care.   She really wants a pap today and HR HPV.  She understands insurance may or may not pay for a pap.   Menses are monthly.  Heavy in the beginning and then reduces.  Cramping at the beginning and then reduces.  Not a problem for her.   She has migraines prior to her menses.  She has blurry vision prior to this.   Denies pelvic pain.   Decreased sex drive.  No pain with intercourse.   She is on CPAP and has sleep apnea.  Working from home.  PCP: Vic Blackbird, MD    Patient's last menstrual period was 08/27/2019.           Sexually active: Yes.    The current method of family planning is spermacide.    Exercising: No.  The patient does not participate in regular exercise at present. Smoker:  no  Health Maintenance: Pap:  09/12/18 Neg:Neg HR HPV.  08/30/17 - Normal and negative HR HPV.  08/29/16 - Normal and negative HR HPV.  Pap 08/01/15 ASCUS and positive HR HPV History of abnormal Pap:  Yes, colposcopy 07/2016 - CIN I. MMG:  01/15/19 BIRADS 1 negative/density  b Colonoscopy: 09/16/19 Stool test was negative BMD:   n/a  Result  n/a TDaP:  2011 Gardasil:   no HIV: negative in pregnancy Hep C: never Screening Labs:  PCP   reports that she has never smoked. She has never used smokeless tobacco. She reports that she does not drink alcohol or use drugs.  Past Medical History:  Diagnosis Date  . Abnormal Pap smear of cervix about 2013, 2016   Colpo biopsy with normal result per pt.  colpo 2016 HPV changes  . DUB (dysfunctional uterine bleeding)    around 2012, saw gyn at the time for eval  . Elevated hemoglobin A1c   . Fibroid    multiple fibroids  . Hypertension   . Left  ovarian cyst 05/2018   do yearly ultrasound.  Doing GYN ONC consulation for monitoring.   . OSA on CPAP     Past Surgical History:  Procedure Laterality Date  . BREAST REDUCTION SURGERY  2005  . CHOLECYSTECTOMY  2004  . INCISION AND DRAINAGE Bilateral 2015   Bilateral axillary region ebaceous cyst  . REDUCTION MAMMAPLASTY      Current Outpatient Medications  Medication Sig Dispense Refill  . losartan (COZAAR) 100 MG tablet Take 1 tablet (100 mg total) by mouth daily. 90 tablet 3   No current facility-administered medications for this visit.     Family History  Problem Relation Age of Onset  . CVA Mother 71       deceased, was 23 at the age of her first stroke  . Diabetes Father   . Deep vein thrombosis Father   . Lupus Sister        questionable diagnosis per patient  . Breast cancer Maternal Aunt   . Breast cancer Maternal Aunt     Review of Systems  Constitutional: Negative.   HENT: Negative.   Eyes: Negative.   Respiratory: Negative.   Cardiovascular: Negative.   Gastrointestinal: Negative.   Endocrine: Negative.  Genitourinary: Negative.   Musculoskeletal: Negative.   Skin: Negative.   Allergic/Immunologic: Negative.   Neurological: Negative.   Hematological: Negative.   Psychiatric/Behavioral: Negative.     Exam:   BP 122/82 (BP Location: Right Arm, Patient Position: Sitting, Cuff Size: Large)   Pulse 76   Temp (!) 97.1 F (36.2 C) (Temporal)   Resp 12   Ht 5' 4"  (1.626 m)   Wt 190 lb (86.2 kg)   LMP 08/27/2019   BMI 32.61 kg/m     General appearance: alert, cooperative and appears stated age Head: normocephalic, without obvious abnormality, atraumatic Neck: no adenopathy, supple, symmetrical, trachea midline and thyroid normal to inspection and palpation Lungs: clear to auscultation bilaterally Breasts: consistent with bilateral reduction, keloids on scars, no masses or tenderness, No nipple retraction or dimpling, No nipple discharge or  bleeding, No axillary adenopathy Heart: regular rate and rhythm Abdomen: soft, non-tender; no masses, no organomegaly Extremities: extremities normal, atraumatic, no cyanosis or edema Skin: skin color, texture, turgor normal. No rashes or lesions Lymph nodes: cervical, supraclavicular, and axillary nodes normal. Neurologic: grossly normal  Pelvic: External genitalia:  no lesions              No abnormal inguinal nodes palpated.              Urethra:  normal appearing urethra with no masses, tenderness or lesions              Bartholins and Skenes: normal                 Vagina: normal appearing vagina with normal color and discharge, no lesions              Cervix: no lesions              Pap taken: Yes.   Bimanual Exam:  Uterus:  normal size, contour, position, consistency, mobility, non-tender              Adnexa: 8 cm nontender, smooth mass, feels somewhat mobile,              Rectal exam: Yes.  .  Confirms.              Anus:  normal sphincter tone, no lesions  Chaperone was present for exam.  Assessment:   Well woman visit with normal exam. Fibroids.  Large left ovarian cyst.  Hx CIN I. Keloid scars form breast reduction. HTN. Elevated HgbA1C.  FH CVA.  Migraine prior to menses.    Plan: Mammogram screening discussed. Self breast awareness reviewed. Pap and HR HPV as above. Guidelines for Calcium, Vitamin D, regular exercise program including cardiovascular and weight bearing exercise. Micronor 3 packs and 3 refills.  Instructed in use.  5 year risk of CIN 3 is 0.03%.   She will do yearly [place] to check her ovarian cyst and fibroids.  No more routine CA125s.  Follow up annually and prn.   After visit summary provided.

## 2019-09-25 NOTE — Patient Instructions (Signed)

## 2019-09-30 ENCOUNTER — Telehealth: Payer: Self-pay | Admitting: Obstetrics and Gynecology

## 2019-09-30 NOTE — Telephone Encounter (Signed)
Call to patient. Patient states she received a call from imaging center on Monday stating she needed to schedule another ultrasound. Patient states she thought she was doing yearly ultrasounds now. States, "I just had one May 18th." RN advised per review of aex note on 09-25-2019 patient doing yearly [place] to check on ovarian cysts and fibroid. RN advised would review with Dr. Quincy Simmonds and return call with additional recommendations. Patient agreeable.   Routing to provider for review.

## 2019-09-30 NOTE — Telephone Encounter (Signed)
Patient left a voicemail during lunch regarding scheduling ultrasound with Dr. Quincy Simmonds. States she had an ultrasound done in 05/2019 and would like to discuss if she really needs another one.

## 2019-09-30 NOTE — Telephone Encounter (Signed)
Call to patient. Message given to patient as seen below from Dr. Quincy Simmonds. Patient verbalized understanding and appreciative of phone call.   Will close encounter.

## 2019-09-30 NOTE — Telephone Encounter (Signed)
Encounter closed in error.

## 2019-09-30 NOTE — Telephone Encounter (Signed)
This ultrasound is to be done in May, 2021.  Clarification already sent through to precert dept and nursing supervisor.  Triage was not in the loop for this clarification.  Thanks.   [place] Malachi Pro

## 2019-10-05 LAB — CYTOLOGY - PAP
Diagnosis: NEGATIVE
High risk HPV: NEGATIVE

## 2019-12-08 ENCOUNTER — Other Ambulatory Visit: Payer: Self-pay | Admitting: Family Medicine

## 2019-12-08 DIAGNOSIS — Z1231 Encounter for screening mammogram for malignant neoplasm of breast: Secondary | ICD-10-CM

## 2019-12-30 ENCOUNTER — Encounter: Payer: Self-pay | Admitting: Family Medicine

## 2019-12-30 ENCOUNTER — Ambulatory Visit (INDEPENDENT_AMBULATORY_CARE_PROVIDER_SITE_OTHER): Payer: 59 | Admitting: Family Medicine

## 2019-12-30 VITALS — BP 142/88 | HR 82 | Temp 98.1°F | Resp 16 | Ht 64.0 in | Wt 195.0 lb

## 2019-12-30 DIAGNOSIS — L02211 Cutaneous abscess of abdominal wall: Secondary | ICD-10-CM | POA: Diagnosis not present

## 2019-12-30 DIAGNOSIS — Z630 Problems in relationship with spouse or partner: Secondary | ICD-10-CM | POA: Diagnosis not present

## 2019-12-30 DIAGNOSIS — F418 Other specified anxiety disorders: Secondary | ICD-10-CM | POA: Diagnosis not present

## 2019-12-30 DIAGNOSIS — I10 Essential (primary) hypertension: Secondary | ICD-10-CM

## 2019-12-30 DIAGNOSIS — L729 Follicular cyst of the skin and subcutaneous tissue, unspecified: Secondary | ICD-10-CM | POA: Diagnosis not present

## 2019-12-30 DIAGNOSIS — E6609 Other obesity due to excess calories: Secondary | ICD-10-CM

## 2019-12-30 DIAGNOSIS — L089 Local infection of the skin and subcutaneous tissue, unspecified: Secondary | ICD-10-CM

## 2019-12-30 DIAGNOSIS — Z6833 Body mass index (BMI) 33.0-33.9, adult: Secondary | ICD-10-CM

## 2019-12-30 DIAGNOSIS — R7303 Prediabetes: Secondary | ICD-10-CM

## 2019-12-30 MED ORDER — SULFAMETHOXAZOLE-TRIMETHOPRIM 800-160 MG PO TABS: 1.0000 | ORAL_TABLET | Freq: Two times a day (BID) | ORAL | 0 refills | Status: DC

## 2019-12-30 MED ORDER — FLUCONAZOLE 150 MG PO TABS: 150.0000 mg | ORAL_TABLET | Freq: Once | ORAL | 0 refills | Status: AC

## 2019-12-30 NOTE — Progress Notes (Signed)
Subjective:    Patient ID: Melanie Little, female    DOB: Mar 28, 1971, 48 y.o.   MRN: [id number]  Patient presents for Knot to Side (x1 week- knot to side around R hip- painful and discolored) and Knot to Breast (has hard knot between breasts)  10 days  ago, she noticed a knot with tenderness on right abd side wall, now has a discoloration, no drainage but she does get boils typically in axilla also has  a knot for for past 3 weeks in center of chest between the breat, it hasa come and go, occ pus would come out, also has a discoloration and is mildly tender  No fever, no chills   No rash otherwise     HTN- taking losartan , she did miss today   She does have epidermal cyst on her back these have not changed   Mammogram scheduled for Jan 29th    She also requested referral for therapyl has had increased stress and marital issues over the past few months, but would like individual therapy first.     Review Of Systems:  GEN- denies fatigue, fever, weight loss,weakness, recent illness HEENT- denies eye drainage, change in vision, nasal discharge, CVS- denies chest pain, palpitations RESP - denies SOB, cough, wheeze ABD- denies N/V, change in stools, abd pain GU- denies dysuria, hematuria, dribbling, incontinence MSK- denies joint pain, muscle aches, injury Neuro- denies headache, dizziness, syncope, seizure activity       Objective:    BP (!) 142/88 Comment: did not take BP med this AM  Pulse 82   Temp 98.1 F (36.7 C) (Temporal)   Resp 16   Ht 5' 4"  (1.626 m)   Wt 195 lb (88.5 kg)   BMI 33.47 kg/m  GEN- NAD, alert and oriented x3 Neck- Supple, no LAD CVS- RRR, no murmur RESP-CTAB ABD-NABS,soft,NT,ND Skin- Right abd side wall small fluctance grape size lesion, mild TTP hyperpigmentation noted on skin above lesion, clear fluid expressed   between breast cystic lesion, no fluid expressed, mild hyperpigmentation to skin above as well 2 epidermal cyst mid back Breast-  normal symmetry, no nipple inversion,no nipple drainage, no nodules or lumps felt Nodes- no axillary nodes Psych- normal affect and mood  EXT- No edema Pulses- Radial, DP- 2+        Assessment & Plan:      Problem List Items Addressed This Visit      Unprioritized   Borderline diabetes    Recheck A1C      Essential hypertension    BP mildly elevated, has not had meds today But overall this is fairly controlled for her history She will return for fasting labs in 1 month She admits to some dietary indiscretions increased junk food      Obese    Other Visit Diagnoses    Infected cyst of skin    -  Primary   infected breast cyst and abscess of abd wall, both smaller doubt much pus, will use warm compress and bactrim, not improved need to drain   Relevant Medications   sulfamethoxazole-trimethoprim (BACTRIM DS) 800-160 MG tablet   fluconazole (DIFLUCAN) 150 MG tablet   Abscess of abdominal wall       Situational anxiety       Marital stress          Note: This dictation was prepared with Dragon dictation along with smaller phrase technology. Any transcriptional errors that result from this process are unintentional.

## 2019-12-30 NOTE — Patient Instructions (Addendum)
Referral for psychotherapy F/U 1 month for fasting labs

## 2019-12-30 NOTE — Assessment & Plan Note (Signed)
BP mildly elevated, has not had meds today But overall this is fairly controlled for her history She will return for fasting labs in 1 month She admits to some dietary indiscretions increased junk food

## 2019-12-30 NOTE — Assessment & Plan Note (Signed)
Recheck A1C

## 2020-01-29 ENCOUNTER — Ambulatory Visit
Admission: RE | Admit: 2020-01-29 | Discharge: 2020-01-29 | Disposition: A | Discharge status: Home / Self Care | Payer: 59 | Source: Ambulatory Visit | Attending: Family Medicine | Admitting: Family Medicine

## 2020-01-29 ENCOUNTER — Other Ambulatory Visit: Payer: Self-pay

## 2020-01-29 ENCOUNTER — Other Ambulatory Visit: Payer: 59

## 2020-01-29 DIAGNOSIS — Z1231 Encounter for screening mammogram for malignant neoplasm of breast: Secondary | ICD-10-CM

## 2020-01-29 DIAGNOSIS — R7303 Prediabetes: Secondary | ICD-10-CM

## 2020-01-29 DIAGNOSIS — I10 Essential (primary) hypertension: Secondary | ICD-10-CM

## 2020-01-30 LAB — LIPID PANEL
Cholesterol: 177 mg/dL (ref <200)
HDL: 46 mg/dL — ABNORMAL LOW (ref >=50)
LDL Cholesterol (Calc): 105 mg/dL (calc) — ABNORMAL HIGH
Non-HDL Cholesterol (Calc): 131 mg/dL (calc) — ABNORMAL HIGH (ref <130)
Total CHOL/HDL Ratio: 3.8 (calc) (ref <5.0)
Triglycerides: 148 mg/dL (ref <150)

## 2020-01-30 LAB — COMPREHENSIVE METABOLIC PANEL
AG Ratio: 1.7 (calc) (ref 1.0–2.5)
ALT: 14 U/L (ref 6–29)
AST: 14 U/L (ref 10–35)
Albumin: 4.3 g/dL (ref 3.6–5.1)
Alkaline phosphatase (APISO): 73 U/L (ref 31–125)
BUN: 9 mg/dL (ref 7–25)
CO2: 29 mmol/L (ref 20–32)
Calcium: 8.9 mg/dL (ref 8.6–10.2)
Chloride: 101 mmol/L (ref 98–110)
Creat: 0.74 mg/dL (ref 0.50–1.10)
Globulin: 2.6 g/dL (calc) (ref 1.9–3.7)
Glucose, Bld: 132 mg/dL — ABNORMAL HIGH (ref 65–99)
Potassium: 3.7 mmol/L (ref 3.5–5.3)
Sodium: 138 mmol/L (ref 135–146)
Total Bilirubin: 0.9 mg/dL (ref 0.2–1.2)
Total Protein: 6.9 g/dL (ref 6.1–8.1)

## 2020-01-30 LAB — CBC WITH DIFFERENTIAL/PLATELET
Absolute Monocytes: 686 cells/uL (ref 200–950)
Basophils Absolute: 94 cells/uL (ref 0–200)
Basophils Relative: 1 %
Eosinophils Absolute: 207 cells/uL (ref 15–500)
Eosinophils Relative: 2.2 %
HCT: 39.4 % (ref 35.0–45.0)
Hemoglobin: 12.5 g/dL (ref 11.7–15.5)
Lymphs Abs: 2491 cells/uL (ref 850–3900)
MCH: 22.4 pg — ABNORMAL LOW (ref 27.0–33.0)
MCHC: 31.7 g/dL — ABNORMAL LOW (ref 32.0–36.0)
MCV: 70.7 fL — ABNORMAL LOW (ref 80.0–100.0)
MPV: 9.7 fL (ref 7.5–12.5)
Monocytes Relative: 7.3 %
Neutro Abs: 5922 cells/uL (ref 1500–7800)
Neutrophils Relative %: 63 %
Platelets: 424 10*3/uL — ABNORMAL HIGH (ref 140–400)
RBC: 5.57 10*6/uL — ABNORMAL HIGH (ref 3.80–5.10)
RDW: 15.6 % — ABNORMAL HIGH (ref 11.0–15.0)
Total Lymphocyte: 26.5 %
WBC: 9.4 10*3/uL (ref 3.8–10.8)

## 2020-01-30 LAB — HEMOGLOBIN A1C
Hgb A1c MFr Bld: 6.7 % of total Hgb — ABNORMAL HIGH (ref <5.7)
Mean Plasma Glucose: 146 (calc)
eAG (mmol/L): 8.1 (calc)

## 2020-03-23 ENCOUNTER — Encounter: Payer: Self-pay | Admitting: Family Medicine

## 2020-04-20 ENCOUNTER — Other Ambulatory Visit: Payer: Self-pay | Admitting: Family Medicine

## 2020-04-28 ENCOUNTER — Other Ambulatory Visit: Payer: 59 | Admitting: Obstetrics and Gynecology

## 2020-04-28 ENCOUNTER — Other Ambulatory Visit: Payer: 59

## 2020-05-04 ENCOUNTER — Other Ambulatory Visit: Payer: Self-pay | Admitting: *Deleted

## 2020-05-04 DIAGNOSIS — D219 Benign neoplasm of connective and other soft tissue, unspecified: Secondary | ICD-10-CM

## 2020-05-04 DIAGNOSIS — N83202 Unspecified ovarian cyst, left side: Secondary | ICD-10-CM

## 2020-05-23 ENCOUNTER — Other Ambulatory Visit: Payer: Self-pay

## 2020-05-24 ENCOUNTER — Encounter: Payer: Self-pay | Admitting: Obstetrics and Gynecology

## 2020-05-24 ENCOUNTER — Ambulatory Visit: Payer: 59 | Admitting: Obstetrics and Gynecology

## 2020-05-24 ENCOUNTER — Ambulatory Visit (INDEPENDENT_AMBULATORY_CARE_PROVIDER_SITE_OTHER): Payer: 59

## 2020-05-24 VITALS — BP 158/100 | HR 62 | Temp 97.8°F | Ht 63.75 in | Wt 190.0 lb

## 2020-05-24 DIAGNOSIS — N83202 Unspecified ovarian cyst, left side: Secondary | ICD-10-CM | POA: Diagnosis not present

## 2020-05-24 DIAGNOSIS — D219 Benign neoplasm of connective and other soft tissue, unspecified: Secondary | ICD-10-CM | POA: Diagnosis not present

## 2020-05-24 NOTE — Progress Notes (Signed)
GYNECOLOGY  VISIT   HPI: 49 y.o.   Married  [place] American  female   [phone] with Patient's last menstrual period was 04/29/2020 (approximate).   here for   [place] for left ovarian cyst which is longstanding and has been followed also by GYN ONC. Her cyst has been 5 - 6 cm in size and had mildly elevated CA125 at times.  Patient also has a history of fibroids, and her current plan is for her to be followed annually with pelvic US and no further routine CA125 monitoring.  She is experiencing menses every month. She takes Micronor.   GYNECOLOGIC HISTORY:w  Patient's last menstrual period was 04/29/2020 (approximate). Contraception: Micronor Menopausal hormone therapy:  n/a Last mammogram: 01-29-20 Neg/density C/BiRads1 Last pap smear: 09-25-19 Neg:Neg HR HPV,  09/12/18 Neg:Neg HR HPV.  08/30/17 - Normal and negative HR HPV.  08/29/16 - Normal and negative HR HPV        OB History    Gravida  4   Para  1   Term  1   Preterm  0   AB  3   Living  1     SAB  0   TAB  3   Ectopic  0   Multiple  0   Live Births  1              Patient Active Problem List   Diagnosis Date Noted  . Headache 03/12/2018  . Numbness and tingling of left upper extremity 03/12/2018  . Elevated CA-125 10/21/2017  . Borderline diabetes 10/16/2017  . Epidermal cyst 07/30/2017  . OSA (obstructive sleep apnea) 04/30/2017  . Chronic hypokalemia 04/30/2017  . Obese 04/30/2017  . Subcutaneous nodule 12/03/2016  . Fibroid, uterine 08/19/2016  . History of abnormal cervical Pap smear 08/19/2016  . Snoring 10/13/2015  . Hypersomnia with sleep apnea 10/13/2015  . Menorrhagia with regular cycle 09/19/2015  . Left ovarian cyst 09/19/2015  . Essential hypertension 06/08/2015    Past Medical History:  Diagnosis Date  . Abnormal Pap smear of cervix about 2013, 2016   Colpo biopsy with normal result per pt.  colpo 2016 HPV changes  . DUB (dysfunctional uterine bleeding)    around 2012, saw gyn at the  time for eval  . Elevated hemoglobin A1c   . Fibroid    multiple fibroids  . Hypertension   . Left ovarian cyst 05/2018   do yearly ultrasound.  Doing GYN ONC consulation for monitoring.   . Migraine   . OSA on CPAP     Past Surgical History:  Procedure Laterality Date  . BREAST REDUCTION SURGERY  2005  . CHOLECYSTECTOMY  2004  . INCISION AND DRAINAGE Bilateral 2015   Bilateral axillary region ebaceous cyst  . REDUCTION MAMMAPLASTY      Current Outpatient Medications  Medication Sig Dispense Refill  . losartan (COZAAR) 100 MG tablet TAKE 1 TABLET(100 MG) BY MOUTH DAILY 90 tablet 3  . norethindrone (MICRONOR) 0.35 MG tablet Take 1 tablet (0.35 mg total) by mouth daily. 3 Package 3   No current facility-administered medications for this visit.     ALLERGIES: Patient has no known allergies.  Family History  Problem Relation Age of Onset  . CVA Mother 65       deceased, was 66 at the age of her first stroke  . Diabetes Father   . Deep vein thrombosis Father   . Lupus Sister        questionable  diagnosis per patient  . Breast cancer Maternal Aunt   . Breast cancer Maternal Aunt     Social History   Socioeconomic History  . Marital status: Married    Spouse name: Not on file  . Number of children: 1  . Years of education: Not on file  . Highest education level: Not on file  Occupational History  . Occupation: [employer]  Tobacco Use  . Smoking status: Never Smoker  . Smokeless tobacco: Never Used  Substance and Sexual Activity  . Alcohol use: No    Alcohol/week: 0.0 standard drinks  . Drug use: No  . Sexual activity: Yes    Birth control/protection: Other-see comments    Comment: OTC vaginal pill  Other Topics Concern  . Not on file  Social History Narrative   Work or School: Education officer, 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[employer]      Home Situation: lives with husband      Spiritual Beliefs: Baptist      Lifestyle: walks  every other day; diet is bad      Social Determinants of Radio broadcast assistant Strain:   . Difficulty of Paying Living Expenses:   Food Insecurity:   . Worried About Charity fundraiser in the Last Year:   . Arboriculturist in the Last Year:   Transportation Needs:   . Film/video editor (Medical):   Marland Kitchen Lack of Transportation (Non-Medical):   Physical Activity:   . Days of Exercise per Week:   . Minutes of Exercise per Session:   Stress:   . Feeling of Stress :   Social Connections:   . Frequency of Communication with Friends and Family:   . Frequency of Social Gatherings with Friends and Family:   . Attends Religious Services:   . Active Member of Clubs or Organizations:   . Attends Archivist Meetings:   Marland Kitchen Marital Status:   Intimate Partner Violence:   . Fear of Current or Ex-Partner:   . Emotionally Abused:   Marland Kitchen Physically Abused:   . Sexually Abused:     Review of Systems  See HPI.   PHYSICAL EXAMINATION:    BP (!) 158/100 (Cuff Size: Large)   Pulse 62   Temp 97.8 F (36.6 C) (Temporal)   Ht 5' 3.75" (1.619 m)   Wt 190 lb (86.2 kg)   LMP 04/29/2020 (Approximate)   BMI 32.87 kg/m     General appearance: alert, cooperative and appears stated age   Pelvic US Uterus with multiple fibroids, largest 5.38 cm.  EMS  6.54m. Follicles left ovary.  Cannot rule out adherence of right ovary to uterus.  No adnexal mass.  No free fluid.    ASSESSMENT  Uterine fibroids.  Left ovarian cyst resolved.   PLAN  We discussed her [place]indings.  She can be followed clinically a this point.  She does not need need to have routine pelvic [place] she has bleeding or pain.  FU for annual exam.   An After Visit Summary was printed and given to the patient.

## 2020-08-01 ENCOUNTER — Encounter: Payer: Self-pay | Admitting: Adult Health

## 2020-08-01 ENCOUNTER — Ambulatory Visit: Payer: Managed Care, Other (non HMO) | Admitting: Adult Health

## 2020-08-01 VITALS — BP 158/96 | HR 86 | Ht 63.75 in | Wt 184.8 lb

## 2020-08-01 DIAGNOSIS — Z9989 Dependence on other enabling machines and devices: Secondary | ICD-10-CM | POA: Diagnosis not present

## 2020-08-01 DIAGNOSIS — G4733 Obstructive sleep apnea (adult) (pediatric): Secondary | ICD-10-CM | POA: Diagnosis not present

## 2020-08-01 NOTE — Patient Instructions (Signed)
Continue using CPAP nightly and greater than 4 hours each night °If your symptoms worsen or you develop new symptoms please let us know.  ° °

## 2020-08-01 NOTE — Progress Notes (Signed)
PATIENT: Melanie Little DOB: 30-Jul-1971  REASON FOR VISIT: follow up HISTORY FROM: patient  HISTORY OF PRESENT ILLNESS: Today 08/01/20:  Melanie Little is a 49 year old female with a history of obstructive sleep apnea on CPAP.  Her download indicates that she use her machine nightly for compliance of 100%.  She is averaging greater than 4 hours each night.  On average she uses her machine 7 hours and 39 minutes.  Her residual AHI is 1.9 on 6 cm of water with EPR of 2.  Leak in the 95th percentile is 22.6 L/min.  She reports that the CPAP is working well.  She returns today for an evaluation.  HISTORY (Copied from Dr.Dohmeier's note) -28-2020, patient reports stiff neck, no stuffy nose and  Good compliance with CPAP.  I have the pleasure of meeting today with Melanie Little, a 49 year old left handed AAF with OSA on CPAP. She endorsed the Epworth sleepiness score at 1 out of 24 possible points, fatigue severity at 20.  Her compliance to CPAP is excellent she has used the CPAP every of the last 30 days, with an average of 8 hours and 3 minutes set pressure is 6 cmH2O was 2 cm EPR, but the residual AHI has crept up to 5.5 which is higher than before.  She does have a significant amount of air leakage and apneas are obstructive in nature.  There are some unknown type apneas that I attributed to air leak.  However with the excellent compliance and the low Epworth Sleepiness Scale I think that we do not necessarily have to make changes unless the patient would like to try a different mask or interface. Her machine was issued October 2016.  She has a history of HTN uncontrolled on 4 medications and was evaluated for various causes. No renal artery stenosis. Now on one medication and doing better.    REVIEW OF SYSTEMS: Out of a complete 14 system review of symptoms, the patient complains only of the following symptoms, and all other reviewed systems are negative.  Epworth sleepiness score 1 Fatigue  severity score 9  ALLERGIES: No Known Allergies  HOME MEDICATIONS: Outpatient Medications Prior to Visit  Medication Sig Dispense Refill   losartan (COZAAR) 100 MG tablet TAKE 1 TABLET(100 MG) BY MOUTH DAILY 90 tablet 3   norethindrone (MICRONOR) 0.35 MG tablet Take 1 tablet (0.35 mg total) by mouth daily. 3 Package 3   No facility-administered medications prior to visit.    PAST MEDICAL HISTORY: Past Medical History:  Diagnosis Date   Abnormal Pap smear of cervix about 2013, 2016   Colpo biopsy with normal result per pt.  colpo 2016 HPV changes   DUB (dysfunctional uterine bleeding)    around 2012, saw gyn at the time for eval   Elevated hemoglobin A1c    Fibroid    multiple fibroids   Hypertension    Left ovarian cyst 05/2018   do yearly ultrasound.  Doing GYN ONC consulation for monitoring.    Migraine    OSA on CPAP     PAST SURGICAL HISTORY: Past Surgical History:  Procedure Laterality Date   BREAST REDUCTION SURGERY  2005   CHOLECYSTECTOMY  2004   INCISION AND DRAINAGE Bilateral 2015   Bilateral axillary region ebaceous cyst   REDUCTION MAMMAPLASTY      FAMILY HISTORY: Family History  Problem Relation Age of Onset   CVA Mother 29       deceased, was 32 at the age of her  first stroke   Diabetes Father    Deep vein thrombosis Father    Lupus Sister        questionable diagnosis per patient   Breast cancer Maternal Aunt    Breast cancer Maternal Aunt     SOCIAL HISTORY: Social History   Socioeconomic History   Marital status: Married    Spouse name: Not on file   Number of children: 1   Years of education: Not on file   Highest education level: Not on file  Occupational History   Occupation: [employer]  Tobacco Use   Smoking status: Never Smoker   Smokeless tobacco: Never Used  Scientific laboratory technician Use: Never used  Substance and Sexual Activity   Alcohol use: No    Alcohol/week: 0.0 standard  drinks   Drug use: No   Sexual activity: Yes    Birth control/protection: Other-see comments    Comment: OTC vaginal pill  Other Topics Concern   Not on file  Social History Narrative   Work or School: Education officer, 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[employer]      Home Situation: lives with husband      Spiritual Beliefs: Baptist      Lifestyle: walks every other day; diet is bad      Social Determinants of Radio broadcast assistant Strain:    Difficulty of Paying Living Expenses:   Food Insecurity:    Worried About Charity fundraiser in the Last Year:    Arboriculturist in the Last Year:   Transportation Needs:    Film/video editor (Medical):    Lack of Transportation (Non-Medical):   Physical Activity:    Days of Exercise per Week:    Minutes of Exercise per Session:   Stress:    Feeling of Stress :   Social Connections:    Frequency of Communication with Friends and Family:    Frequency of Social Gatherings with Friends and Family:    Attends Religious Services:    Active Member of Clubs or Organizations:    Attends Archivist Meetings:    Marital Status:   Intimate Partner Violence:    Fear of Current or Ex-Partner:    Emotionally Abused:    Physically Abused:    Sexually Abused:       PHYSICAL EXAM  Vitals:   08/01/20 1513  BP: (!) 158/96  Pulse: 86  Weight: 184 lb 12.8 oz (83.8 kg)  Height: 5' 3.75" (1.619 m)   Body mass index is 31.97 kg/m.  Generalized: Well developed, in no acute distress  Chest: Lungs clear to auscultation bilaterally  Neurological examination  Mentation: Alert oriented to time, place, history taking. Follows all commands speech and language fluent Cranial nerve II-XII: Extraocular movements were full, visual field were full on confrontational test Head turning and shoulder shrug  were normal and symmetric. Motor: The motor testing reveals 5 over 5 strength of all 4 extremities.  Good symmetric motor tone is noted throughout.  Sensory: Sensory testing is intact to soft touch on all 4 extremities. No evidence of extinction is noted.  Gait and station: Gait is normal.    DIAGNOSTIC DATA (LABS, IMAGING, TESTING) - I reviewed patient records, labs, notes, testing and imaging myself where available.  Lab Results  Component Value Date   WBC 9.4 01/29/2020   HGB 12.5 01/29/2020   HCT 39.4 01/29/2020   MCV 70.7 (L) 01/29/2020  PLT 424 (H) 01/29/2020      Component Value Date/Time   NA 138 01/29/2020 0821   NA 139 11/04/2017 1028   K 3.7 01/29/2020 0821   CL 101 01/29/2020 0821   CO2 29 01/29/2020 0821   GLUCOSE 132 (H) 01/29/2020 0821   BUN 9 01/29/2020 0821   BUN 11 11/04/2017 1028   CREATININE 0.74 01/29/2020 0821   CALCIUM 8.9 01/29/2020 0821   PROT 6.9 01/29/2020 0821   ALBUMIN 4.1 04/30/2017 1529   AST 14 01/29/2020 0821   ALT 14 01/29/2020 0821   ALKPHOS 72 04/30/2017 1529   BILITOT 0.9 01/29/2020 0821   GFRNONAA >60 02/25/2019 1420   GFRNONAA >89 04/30/2017 1529   GFRAA >60 02/25/2019 1420   GFRAA >89 04/30/2017 1529   Lab Results  Component Value Date   CHOL 177 01/29/2020   HDL 46 (L) 01/29/2020   LDLCALC 105 (H) 01/29/2020   TRIG 148 01/29/2020   CHOLHDL 3.8 01/29/2020   Lab Results  Component Value Date   HGBA1C 6.7 (H) 01/29/2020   Lab Results  Component Value Date   ZNBVAPOL41 030 04/30/2017   Lab Results  Component Value Date   TSH 2.80 05/14/2018      ASSESSMENT AND PLAN 49 y.o. year old female  has a past medical history of Abnormal Pap smear of cervix (about 2013, 2016), DUB (dysfunctional uterine bleeding), Elevated hemoglobin A1c, Fibroid, Hypertension, Left ovarian cyst (05/2018), Migraine, and OSA on CPAP. here with:  Obstructive sleep apnea on CPAP   CPAP compliance excellent  Residual AHI <5  Encourage patient continue using CPAP nightly and greater than 4 hours each night  If your symptoms worsen or  you develop new symptoms please let us know.   FU 1 year or sooner if needed  I spent 20 minutes of face-to-face and non-face-to-face time with patient.  This included previsit chart review, lab review, study review, order entry, electronic health record documentation, patient education.  Ward Givens, MSN, NP-C 08/01/2020, 3:22 PM [facility] [address] [place], [place] [address] [phone]

## 2020-09-03 ENCOUNTER — Other Ambulatory Visit: Payer: Self-pay | Admitting: Obstetrics and Gynecology

## 2020-09-06 NOTE — Telephone Encounter (Signed)
Medication refill request: Norlyda  Last AEX:  09-25-2019 BS  Next AEX: 10-03-20  Last MMG (if hormonal medication request): 01-29-20 density C/BIRADS 1 negative  Refill authorized: Today, please advise.   Medication pended for #28, 0RF. Please refill if appropriate.

## 2020-10-03 ENCOUNTER — Ambulatory Visit: Payer: Managed Care, Other (non HMO) | Admitting: Obstetrics and Gynecology

## 2020-10-06 ENCOUNTER — Other Ambulatory Visit: Payer: Self-pay

## 2020-10-06 ENCOUNTER — Ambulatory Visit: Payer: Managed Care, Other (non HMO) | Admitting: Obstetrics and Gynecology

## 2020-10-06 ENCOUNTER — Encounter: Payer: Self-pay | Admitting: Obstetrics and Gynecology

## 2020-10-06 VITALS — BP 146/96 | HR 80 | Resp 20 | Ht 63.5 in | Wt 181.2 lb

## 2020-10-06 DIAGNOSIS — D219 Benign neoplasm of connective and other soft tissue, unspecified: Secondary | ICD-10-CM

## 2020-10-06 DIAGNOSIS — Z01419 Encounter for gynecological examination (general) (routine) without abnormal findings: Secondary | ICD-10-CM

## 2020-10-06 DIAGNOSIS — Z1211 Encounter for screening for malignant neoplasm of colon: Secondary | ICD-10-CM

## 2020-10-06 MED ORDER — NORETHINDRONE 0.35 MG PO TABS
ORAL_TABLET | ORAL | 3 refills | Status: DC
Start: 2020-10-06 — End: 2021-04-28

## 2020-10-06 NOTE — Patient Instructions (Signed)
Uterine Artery Embolization for Fibroids  Uterine artery embolization is a procedure to shrink uterine fibroids. Uterine fibroids are masses of tissue (tumors) that can develop in the womb (uterus). They are also called leiomyomas. This type of tumor is not cancerous (benign) and does not spread to other parts of the body outside of the pelvic area. The pelvic area is the part of the body between the hip bones. You can have one or many fibroids. Fibroids can vary in size, shape, weight, and where they grow in the uterus. Some can become quite large. In this procedure, a thin plastic tube (catheter) is used to inject a chemical that blocks off the blood supply to the fibroid, which causes the fibroid to shrink. Tell a health care provider about:  Any allergies you have.  All medicines you are taking, including vitamins, herbs, eye drops, creams, and over-the-counter medicines.  Any problems you or family members have had with anesthetic medicines.  Any blood disorders you have.  Any surgeries you have had.  Any medical conditions you have.  Whether you are pregnant or may be pregnant. What are the risks? Generally, this is a safe procedure. However, problems may occur, including:  Bleeding.  Allergic reactions to medicines or dyes.  Damage to other structures or organs.  Infection, including blood infection (septicemia).  Injury to the uterus from decreased blood supply.  Lack of menstrual periods (amenorrhea).  Death of tissue cells (necrosis) around your bladder or vulva.  Development of a hole between organs or from an organ to the surface of your skin (fistula).  Blood clot in the legs (deep vein thrombosis) or lung (pulmonary embolus).  Nausea and vomiting. What happens before the procedure? Staying hydrated Follow instructions from your health care provider about hydration, which may include:  Up to 2 hours before the procedure - you may continue to drink clear  liquids, such as water, clear fruit juice, black coffee, and plain tea. Eating and drinking restrictions Follow instructions from your health care provider about eating and drinking, which may include:  8 hours before the procedure - stop eating heavy meals or foods such as meat, fried foods, or fatty foods.  6 hours before the procedure - stop eating light meals or foods, such as toast or cereal.  6 hours before the procedure - stop drinking milk or drinks that contain milk.  2 hours before the procedure - stop drinking clear liquids. Medicines  Ask your health care provider about: ? Changing or stopping your regular medicines. This is especially important if you are taking diabetes medicines or blood thinners. ? Taking over-the-counter medicines, vitamins, herbs, and supplements. ? Taking medicines such as aspirin and ibuprofen. These medicines can thin your blood. Do not take these medicines unless your health care provider tells you to take them.  You may be given antibiotic medicine to help prevent infection.  You may be given medicine to prevent nausea and vomiting (antiemetic). General instructions  Ask your health care provider how your surgical site will be marked or identified.  You may be asked to shower with a germ-killing soap.  Plan to have someone take you home from the hospital or clinic.  If you will be going home right after the procedure, plan to have someone with you for 24 hours.  You will be asked to empty your bladder. What happens during the procedure?  To lower your risk of infection: ? Your health care team will wash or sanitize their hands. ?  Hair may be removed from the surgical area. ? Your skin will be washed with soap.  An IV will be inserted into one of your veins.  You will be given one or more of the following: ? A medicine to help you relax (sedative). ? A medicine to numb the area (local anesthetic).  A small cut (incision) will be made  in your groin.  A catheter will be inserted into the main artery of your leg. The catheter will be guided through the artery to your uterus.  A series of images will be taken while dye is injected through the catheter in your groin. X-rays are taken at the same time. This is done to provide a road map of the blood supply to your uterus and fibroids.  Tiny plastic spheres, about the size of sand grains, will be injected through the catheter. Metal coils may be used to help block the artery. The particles will lodge in tiny branches of the uterine artery that supplies blood to the fibroids.  The procedure will be repeated on the artery that supplies the other side of the uterus.  The catheter will be removed and pressure will be applied to stop the bleeding.  A dressing will be placed over the incision. The procedure may vary among health care providers and hospitals. What happens after the procedure?  Your blood pressure, heart rate, breathing rate, and blood oxygen level will be monitored until the medicines you were given have worn off.  You will be given pain medicine as needed.  You may be given medicine for nausea and vomiting as needed.  Do not drive for 24 hours if you were given a sedative. Summary  Uterine artery embolization is a procedure to shrink uterine fibroids by blocking the blood supply to the fibroid.  You may be given a sedative and local anesthetic for the procedure.  A catheter will be inserted into the main artery of your leg. The catheter will be guided through the artery to your uterus.  After the procedure you will be given pain medicine and medicine for nausea as needed.  Do not drive for 24 hours if you were given a sedative. This information is not intended to replace advice given to you by your health care provider. Make sure you discuss any questions you have with your health care provider. Document Revised: 11/29/2017 Document Reviewed:  03/21/2017 Elsevier Patient Education  2020 [employer].

## 2020-10-06 NOTE — Progress Notes (Signed)
49 y.o. Melanie Little Married [place] American female here for annual exam.  LMP 10-01-20 and heavy--last cycle was 2-3 months ago.   She has some spotting randomly for about 2 months and then had a heavy cycle after 2 weeks.  Changed a pad every 1.5 - 2 hours with overnight pad. She is concerned about the heavy bleeding and feels weary with her cycle. No missed pills.  Has known fibroids.  Her large left ovarian cyst has resolved by last pelvic US. Feels hot all the time.   Considering hysterectomy.   PCP: Vic Blackbird, MD    Patient's last menstrual period was 10/01/2020 (exact date).     Period Pattern: Regular (regular until 2-3 months ago)     Sexually active: Yes.    The current method of family planning is Micronor.    Exercising: No.  The patient does not participate in regular exercise at present. Smoker:  no  Health Maintenance: Pap: 09-25-19 Neg:Neg HR HPV,  09/12/18 Neg:Neg HR HPV.  08/30/17 - Normal and negative HR  History of abnormal Pap:  Yes,  07/2016 colposcopy--CIN I MMG: 01-29-20 Neg/density C/BiRads1 Colonoscopy: 09-16-19 stool test Neg BMD:  n/a Result  n/a TDaP: 2011--will do with PCP Gardasil:   no HIV:Neg in pregnancy Hep C: never Screening Labs:  PCP.   reports that she has never smoked. She has never used smokeless tobacco. She reports that she does not drink alcohol and does not use drugs.  Past Medical History:  Diagnosis Date  . Abnormal Pap smear of cervix about 2013, 2016   Colpo biopsy with normal result per pt.  colpo 2016 HPV changes  . DUB (dysfunctional uterine bleeding)    around 2012, saw gyn at the time for eval  . Elevated hemoglobin A1c   . Fibroid    multiple fibroids  . Hypertension   . Left ovarian cyst 05/2018   do yearly ultrasound.  Doing GYN ONC consulation for monitoring.   . Migraine   . OSA on CPAP     Past Surgical History:  Procedure Laterality Date  . BREAST REDUCTION SURGERY  2005  . CHOLECYSTECTOMY  2004  .  INCISION AND DRAINAGE Bilateral 2015   Bilateral axillary region ebaceous cyst  . REDUCTION MAMMAPLASTY      Current Outpatient Medications  Medication Sig Dispense Refill  . losartan (COZAAR) 100 MG tablet TAKE 1 TABLET(100 MG) BY MOUTH DAILY 90 tablet 3  . NORLYDA 0.35 MG tablet TAKE 1 TABLET(0.35 MG) BY MOUTH DAILY 28 tablet 0   No current facility-administered medications for this visit.    Family History  Problem Relation Age of Onset  . CVA Mother 69       deceased, was 31 at the age of her first stroke  . Diabetes Father   . Deep vein thrombosis Father   . Lupus Sister        questionable diagnosis per patient  . Breast cancer Maternal Aunt   . Breast cancer Maternal Aunt     Review of Systems  All other systems reviewed and are negative.   Exam:   BP (!) 146/96 (Cuff Size: Large)   Pulse 80   Resp 20   Ht 5' 3.5" (1.613 m)   Wt 181 lb 3.2 oz (82.2 kg)   LMP 10/01/2020 (Exact Date)   BMI 31.59 kg/m     General appearance: alert, cooperative and appears stated age Head: normocephalic, without obvious abnormality, atraumatic Neck: no adenopathy, supple, symmetrical,  trachea midline and thyroid normal to inspection and palpation Lungs: clear to auscultation bilaterally Breasts: normal appearance, no masses or tenderness, No nipple retraction or dimpling, No nipple discharge or bleeding, No axillary adenopathy Heart: regular rate and rhythm Abdomen: soft, non-tender; no masses, no organomegaly Extremities: extremities normal, atraumatic, no cyanosis or edema Skin: skin color, texture, turgor normal. No rashes or lesions Lymph nodes: cervical, supraclavicular, and axillary nodes normal. Neurologic: grossly normal  Pelvic: External genitalia:  no lesions              No abnormal inguinal nodes palpated.              Urethra:  normal appearing urethra with no masses, tenderness or lesions              Bartholins and Skenes: normal                 Vagina: normal  appearing vagina with normal color and discharge, no lesions              Cervix: no lesions              Pap taken: No. Bimanual Exam:  Uterus:  14 - 15 week size.              Adnexa: no mass, fullness, tenderness              Rectal exam: Yes.  .  Confirms.              Anus:  normal sphincter tone, no lesions  Chaperone was present for exam.  Assessment:   Well woman visit with normal exam. Fibroids.  14 - 15 week size uterus.  Largeleftovarian cyst.  Resolved on [place] 05/24/20.  Hx CIN I. Keloid scars form breast reduction. HTN. Elevated HgbA1C.  FH CVA. Migraine prior to menses.    Plan: Mammogram screening discussed. Self breast awareness reviewed. Pap and HR HPV as above. Guidelines for Calcium, Vitamin D, regular exercise program including cardiovascular and weight bearing exercise. CBC today. Will check benefits for laparoscopic hysterectomy, bilateral salpingectomy, (possible bilateral oophorectomy if abnormal), and cystoscopy.  We also discussed uterine artery embolization.  May need endometrial biopsy prior to a procedure.  Refill of Micronor.  IFOB. Follow up annually and prn.   After visit summary provided.

## 2020-10-07 LAB — CBC
Hematocrit: 37.4 % (ref 34.0–46.6)
Hemoglobin: 12.2 g/dL (ref 11.1–15.9)
MCH: 22.3 pg — ABNORMAL LOW (ref 26.6–33.0)
MCHC: 32.6 g/dL (ref 31.5–35.7)
MCV: 69 fL — ABNORMAL LOW (ref 79–97)
Platelets: 479 10*3/uL — ABNORMAL HIGH (ref 150–450)
RBC: 5.46 x10E6/uL — ABNORMAL HIGH (ref 3.77–5.28)
RDW: 16.6 % — ABNORMAL HIGH (ref 11.7–15.4)
WBC: 10.1 10*3/uL (ref 3.4–10.8)

## 2020-10-09 ENCOUNTER — Telehealth: Payer: Self-pay | Admitting: Obstetrics and Gynecology

## 2020-10-09 NOTE — Telephone Encounter (Signed)
Please precert surgical benefits for total laparoscopic hysterectomy with bilateral salpingectomy, possible bilateral oophorectomy, cystoscopy for fibroids.

## 2020-10-10 ENCOUNTER — Telehealth: Payer: Self-pay | Admitting: *Deleted

## 2020-10-10 DIAGNOSIS — R7989 Other specified abnormal findings of blood chemistry: Secondary | ICD-10-CM

## 2020-10-10 NOTE — Telephone Encounter (Signed)
-----   Message from Nunzio Cobbs, MD sent at 10/10/2020 12:44 PM EDT ----- Please contact patient in follow up to her blood work showing she is not anemic.  Some of the parameters for her red blood cells indicate she may have an underlying alteration in her blood cells.  I can see she had some evaluation in the past, which was normal.  She has not seen a hematologist or had electrophoresis, from what I can see in [place].  I am happy to make this referral for her to have further evaluation.

## 2020-10-10 NOTE — Telephone Encounter (Signed)
Spoke with patient regarding surgery benefits. Patient acknowledges understanding of information presented. Patient is aware that benefits presented are for professional benefits only. Patient is aware that once surgery is scheduled, the hospital will call with separate benefits. Patient is aware of surgery cancellation policy.  Patient does not wish to schedule at this time. Patient stated she would "let us know" what she decides as far as proceeding.  Routing to Dr. Quincy Simmonds and Verline Lema for [place].

## 2020-10-10 NOTE — Telephone Encounter (Signed)
Burnice Logan, RN  10/10/2020 2:18 PM EDT Back to Top    Left message to call Sharee Pimple, RN at [place].

## 2020-10-11 NOTE — Telephone Encounter (Signed)
Thank you for the update.  Ok to close the encounter.

## 2020-10-12 LAB — FECAL OCCULT BLOOD, IMMUNOCHEMICAL: Fecal Occult Bld: NEGATIVE

## 2020-10-12 NOTE — Telephone Encounter (Signed)
Left message to call Sharee Pimple, RN at [place].

## 2020-10-13 NOTE — Telephone Encounter (Signed)
Spoke with patient, advised of results and recommendations as seen below per Dr. Quincy Simmonds.  Patient states no previous hematology consult or electrophoresis. Patient request to proceed with hematology referral at [facility]. Order placed for referral, patient is aware she will be contacted directly with appt details once scheduled. Patient verbalizes understanding and is agreeable.   Routing to [employer] .   Encounter closed.

## 2020-10-14 ENCOUNTER — Telehealth: Payer: Self-pay | Admitting: Oncology

## 2020-10-14 NOTE — Telephone Encounter (Signed)
Scheduled appointment per new patient referral received on 10/14. Spoke to patient who is aware of appointment date and time.  

## 2020-11-01 ENCOUNTER — Telehealth: Payer: Self-pay | Admitting: Oncology

## 2020-11-01 NOTE — Telephone Encounter (Signed)
Pt cld to reschedule her new hem appt to 12/1 at 11am.

## 2020-11-08 ENCOUNTER — Inpatient Hospital Stay: Payer: 59 | Admitting: Oncology

## 2020-11-09 ENCOUNTER — Ambulatory Visit: Payer: Managed Care, Other (non HMO) | Admitting: Family Medicine

## 2020-11-09 ENCOUNTER — Other Ambulatory Visit: Payer: Self-pay

## 2020-11-09 ENCOUNTER — Encounter: Payer: Self-pay | Admitting: Family Medicine

## 2020-11-09 VITALS — BP 140/82 | HR 82 | Temp 98.5°F | Resp 14 | Ht 63.5 in | Wt 186.0 lb

## 2020-11-09 DIAGNOSIS — G4733 Obstructive sleep apnea (adult) (pediatric): Secondary | ICD-10-CM

## 2020-11-09 DIAGNOSIS — Z566 Other physical and mental strain related to work: Secondary | ICD-10-CM

## 2020-11-09 DIAGNOSIS — I10 Essential (primary) hypertension: Secondary | ICD-10-CM

## 2020-11-09 DIAGNOSIS — F411 Generalized anxiety disorder: Secondary | ICD-10-CM

## 2020-11-09 DIAGNOSIS — F410 Panic disorder [episodic paroxysmal anxiety] without agoraphobia: Secondary | ICD-10-CM

## 2020-11-09 DIAGNOSIS — B351 Tinea unguium: Secondary | ICD-10-CM

## 2020-11-09 DIAGNOSIS — R7303 Prediabetes: Secondary | ICD-10-CM

## 2020-11-09 DIAGNOSIS — L720 Epidermal cyst: Secondary | ICD-10-CM

## 2020-11-09 DIAGNOSIS — F430 Acute stress reaction: Secondary | ICD-10-CM

## 2020-11-09 MED ORDER — LOSARTAN POTASSIUM 100 MG PO TABS
ORAL_TABLET | ORAL | 3 refills | Status: DC
Start: 2020-11-09 — End: 2021-05-01

## 2020-11-09 MED ORDER — HYDROXYZINE HCL 10 MG PO TABS: 10.0000 mg | ORAL_TABLET | Freq: Three times a day (TID) | ORAL | 0 refills | Status: DC | PRN

## 2020-11-09 NOTE — Progress Notes (Signed)
Subjective:    Patient ID: Melanie Little, female    DOB: 06/08/1971, 49 y.o.   MRN: [id number]  Patient presents for Cyst (L side- growing and painful), L Toe Fungus, and Panic Attacks (increased stress at work, feels like she hyperventilates at times)  Patient here to follow-up medications.  She has been taking her blood pressure medicine as prescribed no side effects with the meds.  Quite tearful when I entered the room.  States that she has been overwhelmed at work she has been having panic attacks where she feels like she cannot breathe especially when she has had a confrontation with an employee.  She switched to a management supervising well and thinks that it significantly stressful since June when she did so.  She cries on her way into work as she is overwhelmed and expecting a worse.  She does not have good support from her administration.  She feels like she is barely keeping her head above water most days.  She is requesting some time out of work and to work on herself and her anxiety.  She is wary about taking medications regularly but is willing to try something nonaddictive as needed when she feels the attacks come on.  She is also interested in psychotherapy.  Her sleep has been good as well she is using her CPAP but she still feels mental fog and fatigue during the day.  She has a fungus on her left great toenail that has returned the nail has been crumbling off.  She would like to have this treated.  She has a known epidermal cyst that has been growing and is tender when she sits back in the chair she would like to proceed with getting this removed.  Prediabetes she did have elevated A1c at 6.7% in January but never followed this up.  She is due for recheck labs today  Review Of Systems:  GEN- denies fatigue, fever, weight loss,weakness, recent illness HEENT- denies eye drainage, change in vision, nasal discharge, CVS- denies chest pain, palpitations RESP- denies SOB, cough,  wheeze ABD- denies N/V, change in stools, abd pain GU- denies dysuria, hematuria, dribbling, incontinence MSK- denies joint pain, muscle aches, injury Neuro- denies headache, dizziness, syncope, seizure activity       Objective:    BP 140/82   Pulse 82   Temp 98.5 F (36.9 C) (Temporal)   Resp 14   Ht 5' 3.5" (1.613 m)   Wt 186 lb (84.4 kg)   SpO2 94%   BMI 32.43 kg/m  GEN- NAD, alert and oriented x3 HEENT- PERRL, EOMI, non injected sclera, pink conjunctiva, MMM, oropharynx clear Neck- Supple, no thyromegaly CVS- RRR, no murmur RESP-CTAB ABD-NABS,soft,NT,ND Skin - golf ball sizecyst centert of back, mild TTP  Psych- Depressed affect  tearful, not overly anxious , fair eye contact, no apparent hallucinations, no SI, well groomed   EXT- No edema , left foot 5th digit yellow thickened nail with crumbling of nail Pulses- Radial, DP- 2+        Assessment & Plan:      Problem List Items Addressed This Visit      Unprioritized   Borderline diabetes   Relevant Orders   Hemoglobin A1c   Epidermal cyst   Relevant Orders   Ambulatory referral to General Surgery   Essential hypertension    BP at her baseline, no changes Check labs today, including A1C and renal function      Relevant Medications   losartan (COZAAR)  100 MG tablet   Other Relevant Orders   CBC with Differential/Platelet   Comprehensive metabolic panel   Hemoglobin A1c   TSH   OSA (obstructive sleep apnea)    Continues to benefit from CPAP therapy        Other Visit Diagnoses    GAD (generalized anxiety disorder)    -  Primary   Stress and anxiety, panic attacks affecting her function at work, fear of going into work, referral for therapy, prn atarax, NO SI , out of work 4 weeks and reassess    Relevant Medications   hydrOXYzine (ATARAX/VISTARIL) 10 MG tablet   Other Relevant Orders   Ambulatory referral to Psychology   Stress at work       Relevant Orders   Ambulatory referral to Psychology    Toenail fungus       will use oral meds pending LFT    Panic attack as reaction to stress       Relevant Medications   hydrOXYzine (ATARAX/VISTARIL) 10 MG tablet      Note: This dictation was prepared with Dragon dictation along with smaller phrase technology. Any transcriptional errors that result from this process are unintentional.

## 2020-11-09 NOTE — Assessment & Plan Note (Signed)
Continues to benefit from CPAP therapy

## 2020-11-09 NOTE — Assessment & Plan Note (Signed)
BP at her baseline, no changes Check labs today, including A1C and renal function

## 2020-11-09 NOTE — Patient Instructions (Addendum)
Referral to therapy  FMLA  Atarax as needed for anxiety Referral to surgeon for cyst F/U 1 month for recheck

## 2020-11-10 ENCOUNTER — Other Ambulatory Visit: Payer: Self-pay

## 2020-11-10 DIAGNOSIS — E119 Type 2 diabetes mellitus without complications: Secondary | ICD-10-CM

## 2020-11-10 LAB — COMPREHENSIVE METABOLIC PANEL
AG Ratio: 1.8 (calc) (ref 1.0–2.5)
ALT: 12 U/L (ref 6–29)
AST: 13 U/L (ref 10–35)
Albumin: 4.5 g/dL (ref 3.6–5.1)
Alkaline phosphatase (APISO): 84 U/L (ref 31–125)
BUN: 7 mg/dL (ref 7–25)
CO2: 26 mmol/L (ref 20–32)
Calcium: 9.6 mg/dL (ref 8.6–10.2)
Chloride: 104 mmol/L (ref 98–110)
Creat: 0.7 mg/dL (ref 0.50–1.10)
Globulin: 2.5 g/dL (calc) (ref 1.9–3.7)
Glucose, Bld: 177 mg/dL — ABNORMAL HIGH (ref 65–99)
Potassium: 3.3 mmol/L — ABNORMAL LOW (ref 3.5–5.3)
Sodium: 140 mmol/L (ref 135–146)
Total Bilirubin: 1.2 mg/dL (ref 0.2–1.2)
Total Protein: 7 g/dL (ref 6.1–8.1)

## 2020-11-10 LAB — CBC WITH DIFFERENTIAL/PLATELET
Absolute Monocytes: 607 cells/uL (ref 200–950)
Basophils Absolute: 66 cells/uL (ref 0–200)
Basophils Relative: 0.8 %
Eosinophils Absolute: 107 cells/uL (ref 15–500)
Eosinophils Relative: 1.3 %
HCT: 42.4 % (ref 35.0–45.0)
Hemoglobin: 13.4 g/dL (ref 11.7–15.5)
Lymphs Abs: 1771 cells/uL (ref 850–3900)
MCH: 22.3 pg — ABNORMAL LOW (ref 27.0–33.0)
MCHC: 31.6 g/dL — ABNORMAL LOW (ref 32.0–36.0)
MCV: 70.5 fL — ABNORMAL LOW (ref 80.0–100.0)
MPV: 10.5 fL (ref 7.5–12.5)
Monocytes Relative: 7.4 %
Neutro Abs: 5650 cells/uL (ref 1500–7800)
Neutrophils Relative %: 68.9 %
Platelets: 475 10*3/uL — ABNORMAL HIGH (ref 140–400)
RBC: 6.01 10*6/uL — ABNORMAL HIGH (ref 3.80–5.10)
RDW: 15.7 % — ABNORMAL HIGH (ref 11.0–15.0)
Total Lymphocyte: 21.6 %
WBC: 8.2 10*3/uL (ref 3.8–10.8)

## 2020-11-10 LAB — HEMOGLOBIN A1C
Hgb A1c MFr Bld: 6.8 % of total Hgb — ABNORMAL HIGH (ref <5.7)
Mean Plasma Glucose: 148 (calc)
eAG (mmol/L): 8.2 (calc)

## 2020-11-10 LAB — TSH: TSH: 2.03 mIU/L

## 2020-11-10 MED ORDER — METFORMIN HCL 500 MG PO TABS: 500.0000 mg | ORAL_TABLET | Freq: Two times a day (BID) | ORAL | 3 refills | Status: DC

## 2020-11-11 ENCOUNTER — Encounter: Payer: Self-pay | Admitting: Family Medicine

## 2020-11-11 NOTE — Telephone Encounter (Signed)
Please advise on OBC.   Also, chart notes state that pending LFT nail fungus would be treated. MD please advise.

## 2020-11-14 MED ORDER — TERBINAFINE HCL 250 MG PO TABS: 250.0000 mg | ORAL_TABLET | Freq: Every day | ORAL | 1 refills | Status: DC

## 2020-11-21 ENCOUNTER — Encounter: Payer: Self-pay | Admitting: *Deleted

## 2020-11-22 ENCOUNTER — Telehealth: Payer: Self-pay | Admitting: *Deleted

## 2020-11-22 NOTE — Telephone Encounter (Signed)
Received fax from Maudry Mayhew, Assistant HR Director for [employer]   Call placed to patient for more information.   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   Of note, patient is not eligible for FMLA as she has not been in her current role >1 year.   Patient is also requested that information on accomodation form be as vague as possible as HR is in the same office as she is and is concerned about HIPPA being violated in her office.   Forms routed to provider.

## 2020-11-23 ENCOUNTER — Telehealth: Payer: Self-pay | Admitting: *Deleted

## 2020-11-23 NOTE — Telephone Encounter (Signed)
Please F/U on psychiatry referral. It looks like referral was sent to pediatric office.

## 2020-11-23 NOTE — Telephone Encounter (Signed)
Forms completed

## 2020-11-23 NOTE — Telephone Encounter (Signed)
Call placed to patient and patient made aware.   Reviewed information on forms. Patient agreeable to sending in to HR.   Forms faxed. Copy e-mailed to patient for her records.

## 2020-11-29 ENCOUNTER — Other Ambulatory Visit: Payer: Self-pay

## 2020-11-29 ENCOUNTER — Encounter (HOSPITAL_BASED_OUTPATIENT_CLINIC_OR_DEPARTMENT_OTHER): Payer: Self-pay | Admitting: Surgery

## 2020-11-30 ENCOUNTER — Ambulatory Visit: Payer: Self-pay | Admitting: Surgery

## 2020-11-30 ENCOUNTER — Other Ambulatory Visit (HOSPITAL_COMMUNITY)
Admission: RE | Admit: 2020-11-30 | Discharge: 2020-11-30 | Disposition: A | Discharge status: Home / Self Care | Payer: Managed Care, Other (non HMO) | Source: Ambulatory Visit | Attending: Surgery | Admitting: Surgery

## 2020-11-30 ENCOUNTER — Other Ambulatory Visit: Payer: Self-pay

## 2020-11-30 ENCOUNTER — Encounter (HOSPITAL_BASED_OUTPATIENT_CLINIC_OR_DEPARTMENT_OTHER): Payer: Managed Care, Other (non HMO) | Attending: Surgery

## 2020-11-30 ENCOUNTER — Inpatient Hospital Stay: Payer: Managed Care, Other (non HMO) | Attending: Oncology | Admitting: Oncology

## 2020-11-30 ENCOUNTER — Inpatient Hospital Stay: Payer: Managed Care, Other (non HMO)

## 2020-11-30 VITALS — BP 154/98 | HR 69 | Temp 97.5°F | Resp 14 | Ht 63.0 in | Wt 189.1 lb

## 2020-11-30 DIAGNOSIS — R222 Localized swelling, mass and lump, trunk: Secondary | ICD-10-CM | POA: Diagnosis present

## 2020-11-30 DIAGNOSIS — Z833 Family history of diabetes mellitus: Secondary | ICD-10-CM | POA: Insufficient documentation

## 2020-11-30 DIAGNOSIS — Z79899 Other long term (current) drug therapy: Secondary | ICD-10-CM | POA: Insufficient documentation

## 2020-11-30 DIAGNOSIS — Z803 Family history of malignant neoplasm of breast: Secondary | ICD-10-CM | POA: Insufficient documentation

## 2020-11-30 DIAGNOSIS — D75839 Thrombocytosis, unspecified: Secondary | ICD-10-CM | POA: Diagnosis not present

## 2020-11-30 DIAGNOSIS — Z8249 Family history of ischemic heart disease and other diseases of the circulatory system: Secondary | ICD-10-CM | POA: Insufficient documentation

## 2020-11-30 DIAGNOSIS — Z01812 Encounter for preprocedural laboratory examination: Secondary | ICD-10-CM | POA: Insufficient documentation

## 2020-11-30 DIAGNOSIS — I10 Essential (primary) hypertension: Secondary | ICD-10-CM | POA: Insufficient documentation

## 2020-11-30 DIAGNOSIS — Z832 Family history of diseases of the blood and blood-forming organs and certain disorders involving the immune mechanism: Secondary | ICD-10-CM | POA: Insufficient documentation

## 2020-11-30 DIAGNOSIS — Z7984 Long term (current) use of oral hypoglycemic drugs: Secondary | ICD-10-CM | POA: Diagnosis not present

## 2020-11-30 DIAGNOSIS — R718 Other abnormality of red blood cells: Secondary | ICD-10-CM

## 2020-11-30 DIAGNOSIS — L720 Epidermal cyst: Secondary | ICD-10-CM | POA: Diagnosis not present

## 2020-11-30 DIAGNOSIS — Z20822 Contact with and (suspected) exposure to covid-19: Secondary | ICD-10-CM | POA: Insufficient documentation

## 2020-11-30 DIAGNOSIS — G4733 Obstructive sleep apnea (adult) (pediatric): Secondary | ICD-10-CM | POA: Insufficient documentation

## 2020-11-30 DIAGNOSIS — Z9049 Acquired absence of other specified parts of digestive tract: Secondary | ICD-10-CM | POA: Insufficient documentation

## 2020-11-30 LAB — BASIC METABOLIC PANEL
Anion gap: 11 (ref 5–15)
BUN: 9 mg/dL (ref 6–20)
CO2: 24 mmol/L (ref 22–32)
Calcium: 9.1 mg/dL (ref 8.9–10.3)
Chloride: 105 mmol/L (ref 98–111)
Creatinine, Ser: 0.68 mg/dL (ref 0.44–1.00)
GFR, Estimated: >60 mL/min (ref >60)
Glucose, Bld: 129 mg/dL — ABNORMAL HIGH (ref 70–99)
Potassium: 3.6 mmol/L (ref 3.5–5.1)
Sodium: 140 mmol/L (ref 135–145)

## 2020-11-30 LAB — SARS CORONAVIRUS 2 (TAT 6-24 HRS): SARS Coronavirus 2: NEGATIVE

## 2020-11-30 LAB — POCT PREGNANCY, URINE: Preg Test, Ur: NEGATIVE

## 2020-11-30 NOTE — Progress Notes (Signed)
EKG reviewed by Dr. Royetta Car, anesthesiologist. Faythe Ghee to proceed with surgery. Patient denies CP, SOB today.

## 2020-11-30 NOTE — Progress Notes (Signed)
Reason for the request:      HPI: I was asked by Dr. Buelah Manis to evaluate Ms. Caison for abnormal CBC.  Is a 49 year old woman with history of hypertension and sleep apnea who was found to have an abnormal CBC on November 09, 2020.  At that time her hemoglobin was normal at 13.4 but her MCV was low at 70.5.  RDW is elevated at 15.7 with platelets of 475.  She has normal Height cell count 8.2 with differential.  She has normal kidney function and electrolytes as well as normal LFTs.  Previous CBCs reviewed dating back to 2017 had similar presentation.  Her hemoglobin has been normal with MCV have ranged between 68-70 with mildly elevated platelet count.  Her platelet count fluctuated as high as 475 back to normal range.  Clinically, she has no complaints or specific symptoms related to this issue.  She is scheduled to have vomiting surgery in the near future for presumed lipomas.  She does not report any headaches, blurry vision, syncope or seizures. Does not report any fevers, chills or sweats.  Does not report any cough, wheezing or hemoptysis.  Does not report any chest pain, palpitation, orthopnea or leg edema.  Does not report any nausea, vomiting or abdominal pain.  Does not report any constipation or diarrhea.  Does not report any skeletal complaints.    Does not report frequency, urgency or hematuria.  Does not report any skin rashes or lesions. Does not report any heat or cold intolerance.  Does not report any lymphadenopathy or petechiae.  Does not report any anxiety or depression.  Remaining review of systems is negative.    Past Medical History:  Diagnosis Date  . Abnormal Pap smear of cervix about 2013, 2016   Colpo biopsy with normal result per pt.  colpo 2016 HPV changes  . DUB (dysfunctional uterine bleeding)    around 2012, saw gyn at the time for eval  . Elevated hemoglobin A1c   . Fibroid    multiple fibroids  . Hypertension   . Left ovarian cyst 05/2018   do yearly ultrasound.   Doing GYN ONC consulation for monitoring.   . Migraine   . OSA on CPAP   :  Past Surgical History:  Procedure Laterality Date  . BREAST REDUCTION SURGERY  2005  . CHOLECYSTECTOMY  2004  . INCISION AND DRAINAGE Bilateral 2015   Bilateral axillary region ebaceous cyst  . REDUCTION MAMMAPLASTY    :   Current Outpatient Medications:  .  hydrOXYzine (ATARAX/VISTARIL) 10 MG tablet, Take 1 tablet (10 mg total) by mouth 3 (three) times daily as needed., Disp: 30 tablet, Rfl: 0 .  losartan (COZAAR) 100 MG tablet, TAKE 1 TABLET(100 MG) BY MOUTH DAILY, Disp: 90 tablet, Rfl: 3 .  metFORMIN (GLUCOPHAGE) 500 MG tablet, Take 1 tablet (500 mg total) by mouth 2 (two) times daily with a meal., Disp: 180 tablet, Rfl: 3 .  norethindrone (NORLYDA) 0.35 MG tablet, TAKE 1 TABLET(0.35 MG) BY MOUTH DAILY, Disp: 84 tablet, Rfl: 3 .  terbinafine (LAMISIL) 250 MG tablet, Take 1 tablet (250 mg total) by mouth daily., Disp: 30 tablet, Rfl: 1:  No Known Allergies:  Family History  Problem Relation Age of Onset  . CVA Mother 81       deceased, was 31 at the age of her first stroke  . Diabetes Father   . Deep vein thrombosis Father   . Lupus Sister  questionable diagnosis per patient  . Breast cancer Maternal Aunt   . Breast cancer Maternal Aunt   :  Social History   Socioeconomic History  . Marital status: Married    Spouse name: Not on file  . Number of children: 1  . Years of education: Not on file  . Highest education level: Not on file  Occupational History  . Occupation: [employer]  Tobacco Use  . Smoking status: Never Smoker  . Smokeless tobacco: Never Used  Vaping Use  . Vaping Use: Never used  Substance and Sexual Activity  . Alcohol use: No    Alcohol/week: 0.0 standard drinks  . Drug use: No  . Sexual activity: Yes    Birth control/protection: Pill    Comment: Micronor  Other Topics Concern  . 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 Difficulty of Paying Living Expenses: Not on file  Food Insecurity:   . Worried About Charity fundraiser in the Last Year: Not on file  . Ran Out of Food in the Last Year: Not on file  Transportation Needs:   . Lack of Transportation (Medical): Not on file  . Lack of Transportation (Non-Medical): Not on file  Physical Activity:   . Days of Exercise per Week: Not on file  . Minutes of Exercise per Session: Not on file  Stress:   . Feeling of Stress : Not on file  Social Connections:   . Frequency of Communication with Friends and Family: Not on file  . Frequency of Social Gatherings with Friends and Family: Not on file  . Attends Religious Services: Not on file  . Active Member of Clubs or Organizations: Not on file  . Attends Archivist Meetings: Not on file  . Marital Status: Not on file  Intimate Partner Violence:   . Fear of Current or Ex-Partner: Not on file  . Emotionally Abused: Not on file  . Physically Abused: Not on file  . Sexually Abused: Not on file  :  Pertinent items are noted in HPI.  Exam: Blood pressure (!) 154/98, pulse 69, temperature (!) 97.5 F (36.4 C), temperature source Tympanic, resp. rate 14, height 5' 3"  (1.6 m), weight 189 lb 1.6 oz (85.8 kg), SpO2 100 %.  ECOG 0  General appearance: alert and cooperative appeared without distress. Head: atraumatic without any abnormalities. Eyes: conjunctivae/corneas clear. PERRL.  Sclera anicteric. Throat: lips, mucosa, and tongue normal; without oral thrush or ulcers. Resp: clear to auscultation bilaterally without rhonchi, wheezes or dullness to percussion. Cardio: regular rate and rhythm, S1, S2 normal, no murmur, click, rub or gallop GI:  soft, non-tender; bowel sounds normal; no masses,  no organomegaly Skin: Skin color, texture, turgor normal. No rashes or lesions Lymph nodes: Cervical, supraclavicular, and axillary nodes normal. Neurologic: Grossly normal without any motor, sensory or deep tendon reflexes. Musculoskeletal: No joint deformity or effusion.  CBC    Component Value Date/Time   WBC 8.2 11/09/2020 1514   RBC 6.01 (H) 11/09/2020 1514   HGB 13.4 11/09/2020 1514   HGB 12.2 10/06/2020 1637   HCT 42.4 11/09/2020 1514  HCT 37.4 10/06/2020 1637   PLT 475 (H) 11/09/2020 1514   PLT 479 (H) 10/06/2020 1637   MCV 70.5 (L) 11/09/2020 1514   MCV 69 (L) 10/06/2020 1637   MCH 22.3 (L) 11/09/2020 1514   MCHC 31.6 (L) 11/09/2020 1514   RDW 15.7 (H) 11/09/2020 1514   RDW 16.6 (H) 10/06/2020 1637   LYMPHSABS 1,771 11/09/2020 1514   MONOABS 700 04/30/2017 1529   EOSABS 107 11/09/2020 1514   BASOSABS 66 11/09/2020 1514     Chemistry      Component Value Date/Time   NA 140 11/09/2020 1514   NA 139 11/04/2017 1028   K 3.3 (L) 11/09/2020 1514   CL 104 11/09/2020 1514   CO2 26 11/09/2020 1514   BUN 7 11/09/2020 1514   BUN 11 11/04/2017 1028   CREATININE 0.70 11/09/2020 1514      Component Value Date/Time   CALCIUM 9.6 11/09/2020 1514   ALKPHOS 72 04/30/2017 1529   AST 13 11/09/2020 1514   ALT 12 11/09/2020 1514   BILITOT 1.2 11/09/2020 1514       Assessment and Plan:   49 year old woman with:  1.  Microcytosis without anemia dating back to at least 02/20/2016.  She has normal hematological parameters otherwise with elevated RDW.  The differential diagnosis of these findings were reviewed today.  Iron studies previously in 2018 were within normal range.  Hemoglobinopathy remains the most likely etiology at this time with other conditions such as thalassemia minor or sickle cell trait could explain these findings.  I recommended obtaining hemoglobin electrophoresis to determine whether she has a  hemoglobinopathy.  She does have a family history of sickle cell trait on her father's side which is likely what she has.  The implication of these findings were reviewed at this time and likely has none moving forward.  She does not have any anemia and that the degree of microcytosis is very mild.  After discussion today, she is agreeable to have the laboratory testing to make diagnosis but will not have any clinical implications for the future.  2.  Thrombocytosis: Fluctuating and likely reactive in nature and does not require any intervention at this time.  3.  Follow-up: I am happy to see him in the future as needed.  We will communicate the results of laboratory testing once available.  45  minutes were dedicated to this visit. The time was spent on reviewing laboratory data, discussing treatment options, discussing differential diagnosis and answering questions regarding future plan.    A copy of this consult has been forwarded to the requesting physician.

## 2020-12-02 ENCOUNTER — Encounter (HOSPITAL_BASED_OUTPATIENT_CLINIC_OR_DEPARTMENT_OTHER)
Admission: RE | Disposition: A | Discharge status: Home / Self Care | Payer: Self-pay | Source: Home / Self Care | Attending: Surgery

## 2020-12-02 ENCOUNTER — Ambulatory Visit (HOSPITAL_BASED_OUTPATIENT_CLINIC_OR_DEPARTMENT_OTHER)
Admission: RE | Admit: 2020-12-02 | Discharge: 2020-12-02 | Disposition: A | Discharge status: Home / Self Care | Payer: Managed Care, Other (non HMO) | Attending: Surgery | Admitting: Surgery

## 2020-12-02 ENCOUNTER — Ambulatory Visit (HOSPITAL_BASED_OUTPATIENT_CLINIC_OR_DEPARTMENT_OTHER): Payer: Managed Care, Other (non HMO) | Admitting: Anesthesiology

## 2020-12-02 ENCOUNTER — Encounter (HOSPITAL_BASED_OUTPATIENT_CLINIC_OR_DEPARTMENT_OTHER): Payer: Self-pay | Admitting: Surgery

## 2020-12-02 ENCOUNTER — Other Ambulatory Visit: Payer: Self-pay

## 2020-12-02 DIAGNOSIS — Z79899 Other long term (current) drug therapy: Secondary | ICD-10-CM | POA: Insufficient documentation

## 2020-12-02 DIAGNOSIS — L720 Epidermal cyst: Secondary | ICD-10-CM | POA: Insufficient documentation

## 2020-12-02 DIAGNOSIS — Z7984 Long term (current) use of oral hypoglycemic drugs: Secondary | ICD-10-CM | POA: Insufficient documentation

## 2020-12-02 HISTORY — PX: MASS EXCISION: SHX2000

## 2020-12-02 LAB — GLUCOSE, CAPILLARY: Glucose-Capillary: 89 mg/dL (ref 70–99)

## 2020-12-02 LAB — POCT PREGNANCY, URINE: Preg Test, Ur: NEGATIVE

## 2020-12-02 SURGERY — EXCISION MASS
Anesthesia: Monitor Anesthesia Care | Site: Back

## 2020-12-02 MED ORDER — MIDAZOLAM HCL 2 MG/2ML IJ SOLN
INTRAMUSCULAR | Status: AC
  Filled 2020-12-02: qty 2

## 2020-12-02 MED ORDER — CEFAZOLIN SODIUM-DEXTROSE 2-4 GM/100ML-% IV SOLN
INTRAVENOUS | Status: AC
  Filled 2020-12-02: qty 100

## 2020-12-02 MED ORDER — PROPOFOL 10 MG/ML IV BOLUS
INTRAVENOUS | Status: AC
  Filled 2020-12-02: qty 20

## 2020-12-02 MED ORDER — CEFAZOLIN SODIUM-DEXTROSE 2-4 GM/100ML-% IV SOLN
2.0000 g | INTRAVENOUS | Status: AC
  Administered 2020-12-02: 2 g via INTRAVENOUS

## 2020-12-02 MED ORDER — GABAPENTIN 300 MG PO CAPS
300.0000 mg | ORAL_CAPSULE | ORAL | Status: AC
  Administered 2020-12-02: 300 mg via ORAL

## 2020-12-02 MED ORDER — CELECOXIB 200 MG PO CAPS
ORAL_CAPSULE | ORAL | Status: AC
  Filled 2020-12-02: qty 2

## 2020-12-02 MED ORDER — FENTANYL CITRATE (PF) 100 MCG/2ML IJ SOLN: 25.0000 ug | INTRAMUSCULAR | Status: DC | PRN

## 2020-12-02 MED ORDER — ACETAMINOPHEN 500 MG PO TABS
ORAL_TABLET | ORAL | Status: AC
  Filled 2020-12-02: qty 2

## 2020-12-02 MED ORDER — CELECOXIB 200 MG PO CAPS
400.0000 mg | ORAL_CAPSULE | ORAL | Status: AC
  Administered 2020-12-02: 400 mg via ORAL

## 2020-12-02 MED ORDER — OXYCODONE HCL 5 MG PO TABS: 5.0000 mg | ORAL_TABLET | Freq: Once | ORAL | Status: DC | PRN

## 2020-12-02 MED ORDER — PROPOFOL 500 MG/50ML IV EMUL
INTRAVENOUS | Status: DC | PRN
  Administered 2020-12-02: 100 ug/kg/min via INTRAVENOUS

## 2020-12-02 MED ORDER — ONDANSETRON HCL 4 MG/2ML IJ SOLN
INTRAMUSCULAR | Status: DC | PRN
  Administered 2020-12-02: 4 mg via INTRAVENOUS

## 2020-12-02 MED ORDER — OXYCODONE HCL 5 MG/5ML PO SOLN: 5.0000 mg | Freq: Once | ORAL | Status: DC | PRN

## 2020-12-02 MED ORDER — ACETAMINOPHEN 500 MG PO TABS
1000.0000 mg | ORAL_TABLET | ORAL | Status: AC
  Administered 2020-12-02: 1000 mg via ORAL

## 2020-12-02 MED ORDER — IBUPROFEN 800 MG PO TABS: 800.0000 mg | ORAL_TABLET | Freq: Three times a day (TID) | ORAL | 0 refills | Status: AC

## 2020-12-02 MED ORDER — CHLORHEXIDINE GLUCONATE CLOTH 2 % EX PADS: 6.0000 | MEDICATED_PAD | Freq: Once | CUTANEOUS | Status: DC

## 2020-12-02 MED ORDER — MIDAZOLAM HCL 5 MG/5ML IJ SOLN
INTRAMUSCULAR | Status: DC | PRN
  Administered 2020-12-02: 2 mg via INTRAVENOUS

## 2020-12-02 MED ORDER — GABAPENTIN 300 MG PO CAPS
ORAL_CAPSULE | ORAL | Status: AC
  Filled 2020-12-02: qty 1

## 2020-12-02 MED ORDER — LACTATED RINGERS IV SOLN: INTRAVENOUS | Status: DC

## 2020-12-02 MED ORDER — FENTANYL CITRATE (PF) 100 MCG/2ML IJ SOLN
INTRAMUSCULAR | Status: DC | PRN
  Administered 2020-12-02: 100 ug via INTRAVENOUS

## 2020-12-02 MED ORDER — BUPIVACAINE HCL (PF) 0.5 % IJ SOLN
INTRAMUSCULAR | Status: DC | PRN
  Administered 2020-12-02: 18 mL

## 2020-12-02 MED ORDER — FENTANYL CITRATE (PF) 100 MCG/2ML IJ SOLN
INTRAMUSCULAR | Status: AC
  Filled 2020-12-02: qty 2

## 2020-12-02 MED ORDER — LIDOCAINE 2% (20 MG/ML) 5 ML SYRINGE
INTRAMUSCULAR | Status: DC | PRN
  Administered 2020-12-02: 50 mg via INTRAVENOUS

## 2020-12-02 SURGICAL SUPPLY — 57 items
ADH SKN CLS APL DERMABOND .7 (GAUZE/BANDAGES/DRESSINGS) ×1
APL PRP STRL LF DISP 70% ISPRP (MISCELLANEOUS) ×1
BLADE CLIPPER SURG (BLADE) IMPLANT
BLADE SURG 15 STRL LF DISP TIS (BLADE) ×1 IMPLANT
BLADE SURG 15 STRL SS (BLADE) ×2
CANISTER SUCT 1200ML W/VALVE (MISCELLANEOUS) IMPLANT
CHLORAPREP W/TINT 26 (MISCELLANEOUS) ×2 IMPLANT
CLSR STERI-STRIP ANTIMIC 1/2X4 (GAUZE/BANDAGES/DRESSINGS) IMPLANT
COVER BACK TABLE 60X90IN (DRAPES) ×2 IMPLANT
COVER MAYO STAND STRL (DRAPES) ×2 IMPLANT
COVER WAND RF STERILE (DRAPES) IMPLANT
DECANTER SPIKE VIAL GLASS SM (MISCELLANEOUS) IMPLANT
DERMABOND ADVANCED (GAUZE/BANDAGES/DRESSINGS) ×1
DERMABOND ADVANCED .7 DNX12 (GAUZE/BANDAGES/DRESSINGS) ×1 IMPLANT
DRAPE LAPAROTOMY 100X72 PEDS (DRAPES) ×2 IMPLANT
DRAPE UTILITY XL STRL (DRAPES) ×2 IMPLANT
DRSG TEGADERM 4X4.75 (GAUZE/BANDAGES/DRESSINGS) IMPLANT
ELECT COATED BLADE 2.86 ST (ELECTRODE) ×2 IMPLANT
ELECT REM PT RETURN 9FT ADLT (ELECTROSURGICAL) ×2
ELECTRODE REM PT RTRN 9FT ADLT (ELECTROSURGICAL) ×1 IMPLANT
GAUZE PACKING IODOFORM 1/4X15 (PACKING) IMPLANT
GAUZE SPONGE 4X4 12PLY STRL LF (GAUZE/BANDAGES/DRESSINGS) IMPLANT
GLOVE BIO SURGEON STRL SZ7 (GLOVE) IMPLANT
GLOVE BIO SURGEON STRL SZ7.5 (GLOVE) ×2 IMPLANT
GLOVE BIOGEL PI IND STRL 7.5 (GLOVE) IMPLANT
GLOVE BIOGEL PI IND STRL 8 (GLOVE) ×1 IMPLANT
GLOVE BIOGEL PI INDICATOR 7.5 (GLOVE)
GLOVE BIOGEL PI INDICATOR 8 (GLOVE) ×1
GLOVE SURG UNDER POLY LF SZ7 (GLOVE) ×2 IMPLANT
GOWN STRL REUS W/ TWL LRG LVL3 (GOWN DISPOSABLE) ×1 IMPLANT
GOWN STRL REUS W/ TWL XL LVL3 (GOWN DISPOSABLE) ×1 IMPLANT
GOWN STRL REUS W/TWL LRG LVL3 (GOWN DISPOSABLE) ×2
GOWN STRL REUS W/TWL XL LVL3 (GOWN DISPOSABLE) ×2
KIT MARKER MARGIN INK (KITS) IMPLANT
NEEDLE HYPO 25X1 1.5 SAFETY (NEEDLE) ×2 IMPLANT
NS IRRIG 1000ML POUR BTL (IV SOLUTION) ×2 IMPLANT
PACK BASIN DAY SURGERY FS (CUSTOM PROCEDURE TRAY) ×2 IMPLANT
PENCIL SMOKE EVACUATOR (MISCELLANEOUS) ×2 IMPLANT
SLEEVE SCD COMPRESS KNEE MED (MISCELLANEOUS) IMPLANT
SPONGE LAP 4X18 RFD (DISPOSABLE) ×2 IMPLANT
SUT ETHILON 2 0 FS 18 (SUTURE) IMPLANT
SUT MNCRL AB 4-0 PS2 18 (SUTURE) ×2 IMPLANT
SUT SILK 2 0 SH (SUTURE) IMPLANT
SUT VIC AB 2-0 SH 27 (SUTURE)
SUT VIC AB 2-0 SH 27XBRD (SUTURE) IMPLANT
SUT VIC AB 3-0 SH 27 (SUTURE) ×2
SUT VIC AB 3-0 SH 27X BRD (SUTURE) ×1 IMPLANT
SUT VICRYL 3-0 CR8 SH (SUTURE) IMPLANT
SUT VICRYL 4-0 PS2 18IN ABS (SUTURE) IMPLANT
SWAB COLLECTION DEVICE MRSA (MISCELLANEOUS) IMPLANT
SWAB CULTURE ESWAB REG 1ML (MISCELLANEOUS) IMPLANT
SYR CONTROL 10ML LL (SYRINGE) ×2 IMPLANT
SYR EAR/ULCER 2OZ (SYRINGE) ×2 IMPLANT
TOWEL GREEN STERILE FF (TOWEL DISPOSABLE) ×2 IMPLANT
TUBE CONNECTING 20X1/4 (TUBING) IMPLANT
UNDERPAD 30X36 HEAVY ABSORB (UNDERPADS AND DIAPERS) IMPLANT
YANKAUER SUCT BULB TIP NO VENT (SUCTIONS) IMPLANT

## 2020-12-02 NOTE — H&P (Signed)
Admitting Physician: Nickola Major Naliya Gish  Service: General Surgery  CC: Back mass  Subjective   HPI: Melanie Little is an 49 y.o. female who is here for elective excision of two subcutaneous back masses.  Past Medical History:  Diagnosis Date  . Abnormal Pap smear of cervix about 2013, 2016   Colpo biopsy with normal result per pt.  colpo 2016 HPV changes  . DUB (dysfunctional uterine bleeding)    around 2012, saw gyn at the time for eval  . Elevated hemoglobin A1c   . Fibroid    multiple fibroids  . Hypertension   . Left ovarian cyst 05/2018   do yearly ultrasound.  Doing GYN ONC consulation for monitoring.   . Migraine   . OSA on CPAP     Past Surgical History:  Procedure Laterality Date  . BREAST REDUCTION SURGERY  2005  . CHOLECYSTECTOMY  2004  . INCISION AND DRAINAGE Bilateral 2015   Bilateral axillary region ebaceous cyst  . REDUCTION MAMMAPLASTY      Family History  Problem Relation Age of Onset  . CVA Mother 76       deceased, was 18 at the age of her first stroke  . Diabetes Father   . Deep vein thrombosis Father   . Lupus Sister        questionable diagnosis per patient  . Breast cancer Maternal Aunt   . Breast cancer Maternal Aunt     Social:  reports that she has never smoked. She has never used smokeless tobacco. She reports that she does not drink alcohol and does not use drugs.  Allergies: No Known Allergies  Medications: Current Outpatient Medications  Medication Instructions  . hydrOXYzine (ATARAX/VISTARIL) 10 mg, Oral, 3 times daily PRN  . losartan (COZAAR) 100 MG tablet TAKE 1 TABLET(100 MG) BY MOUTH DAILY  . metFORMIN (GLUCOPHAGE) 500 mg, Oral, 2 times daily with meals  . norethindrone (NORLYDA) 0.35 MG tablet TAKE 1 TABLET(0.35 MG) BY MOUTH DAILY  . terbinafine (LAMISIL) 250 mg, Oral, Daily    ROS - all of the below systems have been reviewed with the patient and positives are indicated with bold text General: chills, fever or  night sweats Eyes: blurry vision or double vision ENT: epistaxis or sore throat Allergy/Immunology: itchy/watery eyes or nasal congestion Hematologic/Lymphatic: bleeding problems, blood clots or swollen lymph nodes Endocrine: temperature intolerance or unexpected weight changes Breast: new or changing breast lumps or nipple discharge Resp: cough, shortness of breath, or wheezing CV: chest pain or dyspnea on exertion GI: as per HPI GU: dysuria, trouble voiding, or hematuria MSK: joint pain or joint stiffness Neuro: TIA or stroke symptoms Derm: pruritus and skin lesion changes Psych: anxiety and depression  Objective   PE Blood pressure (!) 169/98, pulse 78, temperature 98.7 F (37.1 C), temperature source Oral, resp. rate 18, height 5' 3"  (1.6 m), weight 84.1 kg, last menstrual period 12/01/2020, SpO2 100 %. Constitutional: NAD; conversant; no deformities Eyes: Moist conjunctiva; no lid lag; anicteric; PERRL Neck: Trachea midline; no thyromegaly Lungs: Normal respiratory effort; no tactile fremitus CV: RRR; no palpable thrills; no pitting edema GI: Abd Soft, non-tender, non-distended, no palpable hepatosplenomegaly Back: Two subcutaneous masses in the midline back, soft, mobile, not fixed to underlying tissue MSK: Normal range of motion of extremities; no clubbing/cyanosis Psychiatric: Appropriate affect; alert and oriented x3 Lymphatic: No palpable cervical or axillary lymphadenopathy  Results for orders placed or performed during the hospital encounter of 12/02/20 (from the past 24  hour(s))  Pregnancy, urine POC     Status: None   Collection Time: 12/02/20 12:42 PM  Result Value Ref Range   Preg Test, Ur NEGATIVE NEGATIVE  Glucose, capillary     Status: None   Collection Time: 12/02/20  1:06 PM  Result Value Ref Range   Glucose-Capillary 89 70 - 99 mg/dL    Imaging Orders  No imaging studies ordered today     Assessment and Ider is an 49 y.o. female  with two subcutaneous back masses, here for elective excision.  The risks, benefits and alternatives to surgery were discussed.  The patient granted consent to proceed.  Will proceed to OR.  Louanna Raw, M.D. General, Bariatric and Minimally Invasive Surgery  [facility] Use [email or web address] to contact on call provider

## 2020-12-02 NOTE — Op Note (Addendum)
   Patient: Melanie Little (1971/04/06, [id number])  Date of Surgery: 12/02/2020   Preoperative Diagnosis: TWO MIDLINE BACK SUBCUTANEOUS MASSES   Postoperative Diagnosis: TWO MIDLINE BACK SUBCUTANEOUS MASSES  (4cm x 4cm upper lesion, 1cm x 1cm lower lesion)  Surgical Procedure: EXCISION OF TWO MIDLINE BACK SUBCUTANEOUS MASSES:    Operative Team Members:  Surgeon(s) and Role:    * Caison Hearn, Nickola Major, MD - Primary   Anesthesiologist: Merlinda Frederick, MD CRNA: Willa Frater, CRNA   Anesthesia: Monitor Anesthesia Care   Fluids:  Total I/O In: 100 [IV Piggyback:100] Out: 10 [WUJWJ:19]  Complications: None  Drains:  none   Specimen:  ID Type Source Tests Collected by Time Destination  1 : Upper back mass Tissue PATH Soft tissue SURGICAL PATHOLOGY Jaina Morin, Nickola Major, MD 12/02/2020 1410   2 : Lower back mass Tissue PATH Soft tissue SURGICAL PATHOLOGY Mckenna Boruff, Nickola Major, MD 12/02/2020 1411      Disposition:  PACU - hemodynamically stable.  Plan of Care: Discharge to home after PACU    Indications for Procedure: [name] is a 49 y.o. female who presented with two subcutaneous masses of the midline back.  Findings: Epidermal inclusion cysts  Description of Procedure: On the date stated above patient taken operating room suite placed in right lateral decubitus position.  Antibiotics were given prior to the case start patient's back was prepped and draped in usual sterile fashion.  The area overlying the subcutaneous masses was anesthetized using local anesthetic.  We began with the more superior mass.  An incision was made in the skin.  We encountered the cyst just below the dermis and there was some rupture of the cyst.  It had the appearance of an epidermal inclusion cyst.  We dissected the cyst out circumferentially using electrocautery and removed from the field as the first specimen marked upper back subcutaneous mass.  It measured 4 cm x 4 cm. The incision was irrigated  and closed using 3-0 Vicryl for the deeper tissues and 4-0 Monocryl for the skin.  Dermabond was applied.  We then directed our attention of the lower back subcutaneous mass.  An eliptical incision was made in the skin.  We encountered the cyst just below the dermis and there was some rupture of the cyst.  It had the appearance of an epidermal inclusion cyst.  We dissected the cyst out circumferentially using electrocautery and removed from the field as the first specimen marked upper back subcutaneous mass.  It measured 1 cm by 1 cm.  The incision was irrigated and closed using 3-0 Vicryl for the deeper tissues and 4-0 Monocryl for the skin.  Dermabond was applied.  All sponge needle counts correct the end of the case.   Louanna Raw, MD General, Bariatric, & Minimally Invasive Surgery [facility], [place]

## 2020-12-02 NOTE — Anesthesia Preprocedure Evaluation (Addendum)
Anesthesia Evaluation  Patient identified by MRN, date of birth, ID band Patient awake    Reviewed: Allergy & Precautions, NPO status , Patient's Chart, lab work & pertinent test results  Airway Mallampati: II  TM Distance: >3 FB Neck ROM: Full    Dental no notable dental hx.    Pulmonary sleep apnea and Continuous Positive Airway Pressure Ventilation ,    Pulmonary exam normal breath sounds clear to auscultation       Cardiovascular Exercise Tolerance: Good hypertension, Normal cardiovascular exam Rhythm:Regular Rate:Normal     Neuro/Psych  Headaches, negative psych ROS   GI/Hepatic negative GI ROS, Neg liver ROS,   Endo/Other  negative endocrine ROS  Renal/GU negative Renal ROS  negative genitourinary   Musculoskeletal negative musculoskeletal ROS (+)   Abdominal Normal abdominal exam  (+) + obese,   Peds negative pediatric ROS (+)  Hematology negative hematology ROS (+)   Anesthesia Other Findings   Reproductive/Obstetrics                            Anesthesia Physical Anesthesia Plan  ASA: III  Anesthesia Plan: MAC   Post-op Pain Management:    Induction: Intravenous  PONV Risk Score and Plan: 2 and TIVA, Midazolam, Propofol infusion and Treatment may vary due to age or medical condition  Airway Management Planned: Simple Face Mask and Natural Airway  Additional Equipment:   Intra-op Plan:   Post-operative Plan:   Informed Consent: I have reviewed the patients History and Physical, chart, labs and discussed the procedure including the risks, benefits and alternatives for the proposed anesthesia with the patient or authorized representative who has indicated his/her understanding and acceptance.       Plan Discussed with: CRNA and Anesthesiologist  Anesthesia Plan Comments: (Lateral positioning. Propofol gtt. GA/LMA as backup. )        Anesthesia Quick  Evaluation

## 2020-12-02 NOTE — Discharge Instructions (Signed)
Keep your incisions clean and dry. Okay to shower and let soap and water wash over the glue.  Do not sit down in a bath tub, hot tub, pool or submerge the incisions.  The glue will flake off in a few weeks.  The stitches are all beneath the skin and will dissolve on their own.  Call the office with questions.         [facility] [place], [place]  [address] Phone:  [phone]   December 02, 2020  Patient: Melanie Little  Date of Birth: 05-17-1971  Date of Visit: December 02, 2020    To Whom It May Concern:  Timara Loma was seen and treated on December 02, 2020 and can return to work in 24 hours.   If you have any questions or concerns, please don't hesitate to call.  Sincerely,    Treatment Team:  Attending Provider: Felicie Morn, MD        No ibuprofen or Tylenol until after 7pm today if needed  Post Anesthesia Home Care Instructions  Activity: Get plenty of rest for the remainder of the day. A responsible individual must stay with you for 24 hours following the procedure.  For the next 24 hours, DO NOT: -Drive a car -Paediatric nurse -Drink alcoholic beverages -Take any medication unless instructed by your physician -Make any legal decisions or sign important papers.  Meals: Start with liquid foods such as gelatin or soup. Progress to regular foods as tolerated. Avoid greasy, spicy, heavy foods. If nausea and/or vomiting occur, drink only clear liquids until the nausea and/or vomiting subsides. Call your physician if vomiting continues.  Special Instructions/Symptoms: Your throat may feel dry or sore from the anesthesia or the breathing tube placed in your throat during surgery. If this causes discomfort, gargle with warm salt water. The discomfort should disappear within 24 hours.  If you had a scopolamine patch placed behind your ear for the management of post- operative nausea and/or vomiting:  1. The medication in the patch is  effective for 72 hours, after which it should be removed.  Wrap patch in a tissue and discard in the trash. Wash hands thoroughly with soap and water. 2. You may remove the patch earlier than 72 hours if you experience unpleasant side effects which may include dry mouth, dizziness or visual disturbances. 3. Avoid touching the patch. Wash your hands with soap and water after contact with the patch.

## 2020-12-02 NOTE — Transfer of Care (Signed)
Immediate Anesthesia Transfer of Care Note  Patient: Melanie Little  Procedure(s) Performed: EXCISION OF TWO MIDLINE BACK SUBCUTANEOUS MASSES (N/A Back)  Patient Location: PACU  Anesthesia Type:MAC  Level of Consciousness: awake, alert  and oriented  Airway & Oxygen Therapy: Patient Spontanous Breathing and Patient connected to face mask oxygen  Post-op Assessment: Report given to RN and Post -op Vital signs reviewed and stable  Post vital signs: Reviewed and stable  Last Vitals:  Vitals Value Taken Time  BP    Temp    Pulse 74 12/02/20 1435  Resp 23 12/02/20 1435  SpO2 96 % 12/02/20 1435  Vitals shown include unvalidated device data.  Last Pain:  Vitals:   12/02/20 1250  TempSrc: Oral  PainSc: 0-No pain      Patients Stated Pain Goal: 1 (93/81/82 9937)  Complications: No complications documented.

## 2020-12-02 NOTE — Anesthesia Postprocedure Evaluation (Signed)
Anesthesia Post Note  Patient: Melanie Little  Procedure(s) Performed: EXCISION OF TWO MIDLINE BACK SUBCUTANEOUS MASSES (N/A Back)     Patient location during evaluation: PACU Anesthesia Type: MAC Level of consciousness: awake and alert Pain management: pain level controlled Vital Signs Assessment: post-procedure vital signs reviewed and stable Respiratory status: spontaneous breathing and respiratory function stable Cardiovascular status: stable Postop Assessment: no apparent nausea or vomiting Anesthetic complications: no   No complications documented.  Last Vitals:  Vitals:   12/02/20 1500 12/02/20 1527  BP: (!) 149/96 (!) 170/90  Pulse: 63 64  Resp: 19 20  Temp:  36.7 C  SpO2: 100% 100%    Last Pain:  Vitals:   12/02/20 1527  TempSrc:   PainSc: 0-No pain                 Merlinda Frederick

## 2020-12-05 ENCOUNTER — Encounter (HOSPITAL_BASED_OUTPATIENT_CLINIC_OR_DEPARTMENT_OTHER): Payer: Self-pay | Admitting: Surgery

## 2020-12-05 LAB — HGB FRACTIONATION CASCADE

## 2020-12-05 LAB — HGB FRACTIONATION BY HPLC
Hgb A2: 3.9 % — ABNORMAL HIGH (ref 1.8–3.2)
Hgb A: 69.4 % — ABNORMAL LOW (ref 96.4–98.8)
Hgb C: 26.7 % — ABNORMAL HIGH
Hgb E: 0 %
Hgb F: 0 % (ref 0.0–2.0)
Hgb S: 0 %
Hgb Variant: 0 %

## 2020-12-05 LAB — SURGICAL PATHOLOGY

## 2020-12-16 ENCOUNTER — Other Ambulatory Visit: Payer: Self-pay

## 2020-12-16 ENCOUNTER — Ambulatory Visit: Payer: Self-pay | Admitting: Family Medicine

## 2020-12-16 ENCOUNTER — Ambulatory Visit: Payer: Managed Care, Other (non HMO) | Admitting: Nurse Practitioner

## 2020-12-16 VITALS — BP 174/100 | HR 83 | Temp 97.9°F | Ht 63.0 in | Wt 188.2 lb

## 2020-12-16 DIAGNOSIS — F410 Panic disorder [episodic paroxysmal anxiety] without agoraphobia: Secondary | ICD-10-CM

## 2020-12-16 DIAGNOSIS — T148XXA Other injury of unspecified body region, initial encounter: Secondary | ICD-10-CM | POA: Insufficient documentation

## 2020-12-16 DIAGNOSIS — F430 Acute stress reaction: Secondary | ICD-10-CM

## 2020-12-16 DIAGNOSIS — L24A9 Irritant contact dermatitis due friction or contact with other specified body fluids: Secondary | ICD-10-CM | POA: Diagnosis not present

## 2020-12-16 DIAGNOSIS — Z566 Other physical and mental strain related to work: Secondary | ICD-10-CM | POA: Insufficient documentation

## 2020-12-16 DIAGNOSIS — R9431 Abnormal electrocardiogram [ECG] [EKG]: Secondary | ICD-10-CM | POA: Diagnosis not present

## 2020-12-16 NOTE — Patient Instructions (Addendum)
Check out the talkspace app for counseling/therapy.   Wound Closure Removal, Care After This sheet gives you information about how to care for yourself after your stitches (sutures), staples, or skin adhesives have been removed. Your health care provider may also give you more specific instructions. If you have problems or questions, contact your health care provider. What can I expect after the procedure? After your sutures or staples have been removed or your skin adhesives have fallen off, it is common to have:  Some discomfort and swelling in the area.  Slight redness in the area. Follow these instructions at home: If you have a bandage:  Wash your hands with soap and water before you change your bandage (dressing). If soap and water are not available, use hand sanitizer.  Change your dressing as told by your health care provider. If your dressing becomes wet or dirty, or develops a bad smell, change it as soon as possible.  If your dressing sticks to your skin, pour warm, clean water over it until it loosens and can be removed without pulling apart the wound edges. Pat the area dry with a soft, clean towel. Do not rub the wound because that may cause bleeding. Wound care   Keep the wound area dry and clean.  Check your wound every day for signs of infection. Check for: ? Redness, swelling, or pain. ? Fluid or blood. ? Warmth. ? Pus or a bad smell.  Wash your hands with soap and water before and after touching your wound.  Apply cream or ointment only as told by your health care provider. If you are using cream or ointment, wash the area with soap and water 2 times a day to remove all the cream or ointment. Rinse off the soap and pat the area dry with a clean towel.  If skin glue or adhesive strips were applied after sutures or staples were removed, leave these closures in place until they peel off on their own. If adhesive strip edges start to loosen and curl up, you may trim  the loose edges. Do not remove adhesive strips completely unless your health care provider tells you to do that.  Continue to protect the wound from injury.  Do not pick at your wound. Picking can cause an infection. Bathing  Do not take baths, swim, or use a hot tub until your health care provider approves.  Ask your health care provider when it is okay to shower.  Follow these steps for showering: ? If you have a dressing, remove it before getting into the shower. ? In the shower, allow soapy water to get on the wound. Avoid scrubbing the wound. ? When you get out of the shower, dry the wound by patting it with a clean towel. ? Reapply a dressing over the wound if needed. Scar care  When your wound has completely healed, take actions to help decrease the size of your scar: ? Wear sunscreen over the scar or cover it with clothing when you are outside. New scars get sunburned easily, which can make scarring worse. ? Gently massage the scarred area. This can decrease scar thickness. General instructions  Take over-the-counter and prescription medicines only as told by your health care provider.  Keep all follow-up visits as told by your health care provider. This is important. Contact a health care provider if:  You have redness, swelling, or pain around your wound.  You have fluid or blood coming from your wound.  Your wound  feels warm to the touch.  You have pus or a bad smell coming from your wound.  Your wound opens up.  You have chills. Get help right away if:  You have a fever.  You have redness that is spreading from your wound. Summary  Change your dressing as told by your health care provider. If your dressing becomes wet or dirty, or develops a bad smell, change it as soon as possible.  Check your wound every day for signs of infection.  Wash your hands with soap and water before and after touching your wound. This information is not intended to replace  advice given to you by your health care provider. Make sure you discuss any questions you have with your health care provider. Document Revised: 11/29/2017 Document Reviewed: 10/07/2017 Elsevier Patient Education  2020 [employer].

## 2020-12-16 NOTE — Assessment & Plan Note (Signed)
Reports has been coping better.  Will place referral to Psychology today.  Also discussed cell phone applications that can be used for therapy.  Patient to reach out to [place] if she does not hear from [place] in ~1-2 weeks regarding referral.

## 2020-12-16 NOTE — Assessment & Plan Note (Signed)
T-wave changes noted on most recent EKG.  Recently diagnosed with diabetes, mother passed of stroke in her 23s.  Examination today and history unremarkable; no chest pain or shortness of breath.  Will place referral to Cardiology for further work up.

## 2020-12-16 NOTE — Progress Notes (Signed)
Subjective:    Patient ID: Melanie Little, female    DOB: 1971-08-04, 49 y.o.   MRN: [id number]  HPI: Sabriel Borromeo is a 49 y.o. female presenting for follow up.  Chief Complaint  Patient presents with   Wound Check    Wound chk on back, referral check on therapy and ekg showed t wave that was concerning at preop. Not sure if cardio referral is needed   SKIN LESION 1.5 cm x 1 cm Duration: weeks Location: mid back Painful: no Itching: yes Onset:  Context: bigger  Associated signs and symptoms: fevers History of skin cancer: no History of precancerous skin lesions: no Family history of skin cancer: no  Questions about EKG Worried about most recent EKG pre-operative for cyst removal.  Reports her mother died in her 31s due to a stroke.  Anxiety - reports this has gotten better; has been working on coping mechanisms.  Wondering about referral to Psychology - has not heard from anybody. Is back at work and reports it is still stressful, but she is coping in healthy ways using prayer.  No Known Allergies  Outpatient Encounter Medications as of 12/16/2020  Medication Sig   hydrOXYzine (ATARAX/VISTARIL) 10 MG tablet Take 1 tablet (10 mg total) by mouth 3 (three) times daily as needed.   losartan (COZAAR) 100 MG tablet TAKE 1 TABLET(100 MG) BY MOUTH DAILY   metFORMIN (GLUCOPHAGE) 500 MG tablet Take 1 tablet (500 mg total) by mouth 2 (two) times daily with a meal.   norethindrone (NORLYDA) 0.35 MG tablet TAKE 1 TABLET(0.35 MG) BY MOUTH DAILY   terbinafine (LAMISIL) 250 MG tablet Take 1 tablet (250 mg total) by mouth daily.   No facility-administered encounter medications on file as of 12/16/2020.    Patient Active Problem List   Diagnosis Date Noted   Abnormal EKG 12/16/2020   Stress at work 12/16/2020   Wound drainage 12/16/2020   Headache 03/12/2018   Numbness and tingling of left upper extremity 03/12/2018   Elevated CA-125 10/21/2017   Borderline diabetes  10/16/2017   Epidermal cyst 07/30/2017   OSA (obstructive sleep apnea) 04/30/2017   Chronic hypokalemia 04/30/2017   Obese 04/30/2017   Subcutaneous nodule 12/03/2016   Fibroid, uterine 08/19/2016   History of abnormal cervical Pap smear 08/19/2016   Snoring 10/13/2015   Hypersomnia with sleep apnea 10/13/2015   Menorrhagia with regular cycle 09/19/2015   Left ovarian cyst 09/19/2015   Essential hypertension 06/08/2015    Past Medical History:  Diagnosis Date   Abnormal Pap smear of cervix about 2013, 2016   Colpo biopsy with normal result per pt.  colpo 2016 HPV changes   DUB (dysfunctional uterine bleeding)    around 2012, saw gyn at the time for eval   Elevated hemoglobin A1c    Fibroid    multiple fibroids   Hypertension    Left ovarian cyst 05/2018   do yearly ultrasound.  Doing GYN ONC consulation for monitoring.    Migraine    OSA on CPAP     Relevant past medical, surgical, family and social history reviewed and updated as indicated. Interim medical history since our last visit reviewed.  Review of Systems  Constitutional: Negative.  Negative for activity change, appetite change and fever.  Respiratory: Negative.  Negative for chest tightness, shortness of breath and wheezing.   Cardiovascular: Negative.  Negative for chest pain, palpitations and leg swelling.  Gastrointestinal: Negative.  Negative for nausea and vomiting.  Skin: Positive for  wound. Negative for color change, pallor and rash.  Neurological: Negative.  Negative for dizziness, light-headedness and headaches.  Psychiatric/Behavioral: Negative for confusion and decreased concentration. The patient is nervous/anxious.     Per HPI unless specifically indicated above     Objective:    BP (!) 174/100    Pulse 83    Temp 97.9 F (36.6 C)    Ht 5' 3"  (1.6 m)    Wt 188 lb 3.2 oz (85.4 kg)    LMP 12/01/2020 (Exact Date)    SpO2 97%    BMI 33.34 kg/m   Wt Readings from Last 3  Encounters:  12/16/20 188 lb 3.2 oz (85.4 kg)  12/02/20 185 lb 6.5 oz (84.1 kg)  11/30/20 189 lb 1.6 oz (85.8 kg)    Physical Exam Vitals and nursing note reviewed.  Constitutional:      General: She is not in acute distress.    Appearance: Normal appearance. She is not toxic-appearing.  Cardiovascular:     Rate and Rhythm: Normal rate and regular rhythm.     Heart sounds: Normal heart sounds.  Pulmonary:     Effort: Pulmonary effort is normal.     Breath sounds: Normal breath sounds. No wheezing, rhonchi or rales.  Skin:    General: Skin is warm and dry.     Coloration: Skin is not jaundiced or pale.     Findings: Wound present. No erythema.       Neurological:     General: No focal deficit present.     Mental Status: She is alert and oriented to person, place, and time.     Motor: No weakness.     Gait: Gait normal.  Psychiatric:        Mood and Affect: Mood normal.        Behavior: Behavior normal.        Thought Content: Thought content normal.        Judgment: Judgment normal.        Assessment & Plan:   Problem List Items Addressed This Visit      Other   Abnormal EKG - Primary    T-wave changes noted on most recent EKG.  Recently diagnosed with diabetes, mother passed of stroke in her 46s.  Examination today and history unremarkable; no chest pain or shortness of breath.  Will place referral to Cardiology for further work up.      Relevant Orders   Ambulatory referral to Cardiology   Stress at work    Reports has been coping better.  Will place referral to Psychology today.  Also discussed cell phone applications that can be used for therapy.  Patient to reach out to [place] if she does not hear from [place] in ~1-2 weeks regarding referral.      Relevant Orders   Ambulatory referral to Psychology   Wound drainage    S/p epidermal cyst 12/02/2020.  Has been draining/open x 1 week.  Packing placed today and covered, instructed on wound care including replacing top  bandage daily and to return to clinic Tuesday for packing removal and re-evaluation.  Has follow up scheduled with general surgery 12/29 - to keep this follow up appointment.        Other Visit Diagnoses    Panic attack as reaction to stress       Relevant Orders   Ambulatory referral to Psychology       Follow up plan: Return in about 4 days (around 12/20/2020)  for wound check .

## 2020-12-16 NOTE — Assessment & Plan Note (Signed)
S/p epidermal cyst 12/02/2020.  Has been draining/open x 1 week.  Packing placed today and covered, instructed on wound care including replacing top bandage daily and to return to clinic Tuesday for packing removal and re-evaluation.  Has follow up scheduled with general surgery 12/29 - to keep this follow up appointment.

## 2020-12-20 ENCOUNTER — Ambulatory Visit: Payer: Managed Care, Other (non HMO) | Admitting: Nurse Practitioner

## 2020-12-20 NOTE — Telephone Encounter (Signed)
See referral notes. Patient has been contacted by [facility] [place] she has to fill out paperwork prior to being scheduled.

## 2021-01-09 ENCOUNTER — Other Ambulatory Visit: Payer: Self-pay

## 2021-01-09 ENCOUNTER — Ambulatory Visit: Payer: Managed Care, Other (non HMO) | Admitting: Internal Medicine

## 2021-01-09 ENCOUNTER — Encounter: Payer: Self-pay | Admitting: Internal Medicine

## 2021-01-09 ENCOUNTER — Encounter: Payer: Self-pay | Admitting: *Deleted

## 2021-01-09 VITALS — BP 148/104 | HR 64 | Ht 63.0 in | Wt 185.4 lb

## 2021-01-09 DIAGNOSIS — I10 Essential (primary) hypertension: Secondary | ICD-10-CM

## 2021-01-09 DIAGNOSIS — R0600 Dyspnea, unspecified: Secondary | ICD-10-CM | POA: Diagnosis not present

## 2021-01-09 DIAGNOSIS — E119 Type 2 diabetes mellitus without complications: Secondary | ICD-10-CM

## 2021-01-09 DIAGNOSIS — R0609 Other forms of dyspnea: Secondary | ICD-10-CM | POA: Insufficient documentation

## 2021-01-09 DIAGNOSIS — R9431 Abnormal electrocardiogram [ECG] [EKG]: Secondary | ICD-10-CM

## 2021-01-09 DIAGNOSIS — I517 Cardiomegaly: Secondary | ICD-10-CM

## 2021-01-09 NOTE — Patient Instructions (Signed)
Medication Instructions:  Your physician recommends that you continue on your current medications as directed. Please refer to the Current Medication list given to you today.  *If you need a refill on your cardiac medications before your next appointment, please call your pharmacy*   Lab Work: None ordered  If you have labs (blood work) drawn today and your tests are completely normal, you will receive your results only by: Marland Kitchen MyChart Message (if you have MyChart) OR . A paper copy in the mail If you have any lab test that is abnormal or we need to change your treatment, we will call you to review the results.   Testing/Procedures: Your physician has requested that you have en exercise stress myoview. For further information please visit [email or web address]. Please follow instruction sheet, as given.  You will also need to be covid tested prior to your stress test.  They will let you know when and where to go for this once they schedule your test.   Your physician has requested that you have an echocardiogram. Echocardiography is a painless test that uses sound waves to create images of your heart. It provides your doctor with information about the size and shape of your heart and how well your heart's chambers and valves are working. This procedure takes approximately one hour. There are no restrictions for this procedure.     Follow-Up: At [facility], you and your health needs are our priority.  As part of our continuing mission to provide you with exceptional heart care, we have created designated Provider Care Teams.  These Care Teams include your primary Cardiologist (physician) and Advanced Practice Providers (APPs -  Physician Assistants and Nurse Practitioners) who all work together to provide you with the care you need, when you need it.  We recommend signing up for the patient portal called "MyChart".  Sign up information is provided on this After Visit Summary.  MyChart is  used to connect with patients for Virtual Visits (Telemedicine).  Patients are able to view lab/test results, encounter notes, upcoming appointments, etc.  Non-urgent messages can be sent to your provider as well.   To learn more about what you can do with MyChart, go to [email or web address].    Your next appointment:   3 month(s)  The format for your next appointment:   In Person  Provider:   Rudean Haskell, MD   Other Instructions

## 2021-01-09 NOTE — Progress Notes (Signed)
Cardiology Office Note:    Date:  01/09/2021   ID:  Melanie Little, DOB June 24, 1971, MRN [id number]  PCP:  Alycia Rossetti, MD  [facility] Cardiologist:  No primary care provider on file.  [facility] Electrophysiologist:  None   CC: EKG Changes Consulted for the evaluation of TWI at the behest of [place], Modena Nunnery, MD  History of Present Illness:    Melanie Little is a 50 y.o. female with a hx of DM with  HTN with HTN Urgency, OSA on CPAP who presents for evaluation.  Patient notes that she is feeling well.  She had no symptoms.  Has cysts on her back and had ECG prior to procedure.  Incidentally, found lateral TWI.  Patient notes that she is fairly sedentary, but does not have symptoms.  Notes the last time she saw cardiology she has very high blood pressure; this has improved since 2018.   Has had no chest pain, chest pressure, chest tightness, chest stinging.  Does have random arm pain not associated with exertion.  Had this two years ago and was told it was a muscle spasm and was treated successfully with muscle relaxants.    Patient exertion notable for going up the stairsand feels no symptoms.  Notes shortness of breath after a lot of talking, note with exertion .  No PND or orthopnea.  No bendopnea, leg swelling , or abdominal swelling.  No syncope or near syncope .  Notes occasional heart fluttering, once every two months with minimal symptoms.  Patient reports prior cardiac testing including 2018 echo  No history of pre-eclampsia or prematurity.   Ambulatory BP not done.   Past Medical History:  Diagnosis Date  . Abnormal EKG   . Abnormal Pap smear of cervix about 2013, 2016   Colpo biopsy with normal result per pt.  colpo 2016 HPV changes  . DUB (dysfunctional uterine bleeding)    around 2012, saw gyn at the time for eval  . Elevated hemoglobin A1c   . Fibroid    multiple fibroids  . Hypertension   . Left ovarian cyst 05/2018   do yearly ultrasound.  Doing  GYN ONC consulation for monitoring.   . Migraine   . OSA on CPAP     Past Surgical History:  Procedure Laterality Date  . BREAST REDUCTION SURGERY  2005  . CHOLECYSTECTOMY  2004  . INCISION AND DRAINAGE Bilateral 2015   Bilateral axillary region ebaceous cyst  . MASS EXCISION N/A 12/02/2020   Procedure: EXCISION OF TWO MIDLINE BACK SUBCUTANEOUS MASSES;  Surgeon: Felicie Morn, MD;  Location: [facility];  Service: General;  Laterality: N/A;  . REDUCTION MAMMAPLASTY      Current Medications: Current Meds  Medication Sig  . hydrOXYzine (ATARAX/VISTARIL) 10 MG tablet Take 1 tablet (10 mg total) by mouth 3 (three) times daily as needed.  Marland Kitchen losartan (COZAAR) 100 MG tablet TAKE 1 TABLET(100 MG) BY MOUTH DAILY  . metFORMIN (GLUCOPHAGE) 500 MG tablet Take 1 tablet (500 mg total) by mouth 2 (two) times daily with a meal.  . norethindrone (NORLYDA) 0.35 MG tablet TAKE 1 TABLET(0.35 MG) BY MOUTH DAILY  . terbinafine (LAMISIL) 250 MG tablet Take 1 tablet (250 mg total) by mouth daily.     Allergies:   Patient has no known allergies.   Social History   Socioeconomic History  . Marital status: Married    Spouse name: Not on file  . Number of children: 1  .  Years of education: Not on file  . Highest education level: Not on file  Occupational History  . Occupation: [employer]  Tobacco Use  . Smoking status: Never Smoker  . Smokeless tobacco: Never Used  Vaping Use  . Vaping Use: Never used  Substance and Sexual Activity  . Alcohol use: No    Alcohol/week: 0.0 standard drinks  . Drug use: No  . Sexual activity: Yes    Birth control/protection: Pill    Comment: Micronor  Other Topics Concern  . Not on file  Social History Narrative   Work or School: Education officer, 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[employer]      Home Situation: lives with husband      Spiritual Beliefs: Baptist      Lifestyle: walks every other day; diet is bad       Social Determinants of Radio broadcast assistant Strain: Not on file  Food Insecurity: Not on file  Transportation Needs: Not on file  Physical Activity: Not on file  Stress: Not on file  Social Connections: Not on file    Works in adult Environmental health practitioner  Family History: The patient's family history includes Breast cancer in her maternal aunt and maternal aunt; CVA (age of onset: 97) in her mother; Deep vein thrombosis in her father; Diabetes in her father; Lupus in her sister.  History of coronary artery disease notable for no members. History of heart failure notable for possible mother.  History of arrhythmia notable for atrial fibrillation.  ROS:   Please see the history of present illness.     All other systems reviewed and are negative.  EKGs/Labs/Other Studies Reviewed:    The following studies were reviewed today:  EKG:  01/09/21: SR rate 64 latera TWI 11/30/20:  SR rate 67 lateral TWI  Transthoracic Echocardiogram: Date: 11/04/2017 Results: Moderate to Severe Concentric Hypertrophy Study Conclusions  - Left ventricle: The cavity size was normal. There was moderate  concentric hypertrophy. Systolic function was normal. The  estimated ejection fraction was in the range of 55% to 60%. Wall  motion was normal; there were no regional wall motion  abnormalities. Left ventricular diastolic function parameters  were normal.  - Pulmonary arteries: Systolic pressure could not be accurately  estimated.   Recent Labs: 11/09/2020: ALT 12; Hemoglobin 13.4; Platelets 475; TSH 2.03 11/30/2020: BUN 9; Creatinine, Ser 0.68; Potassium 3.6; Sodium 140  Recent Lipid Panel    Component Value Date/Time   CHOL 177 01/29/2020 0821   TRIG 148 01/29/2020 0821   HDL 46 (L) 01/29/2020 0821   CHOLHDL 3.8 01/29/2020 0821   VLDL 30.4 12/03/2016 1233   LDLCALC 105 (H) 01/29/2020 0821   Risk Assessment/Calculations:     The 10-year ASCVD risk score Mikey Bussing  DC Brooke Bonito., et al., 2013) is: 14.3%   Values used to calculate the score:     Age: 51 years     Sex: Female     Is Non-Hispanic African American: Yes     Diabetic: Yes     Tobacco smoker: No     Systolic Blood Pressure: 163 mmHg     Is BP treated: Yes     HDL Cholesterol: 46 mg/dL     Total Cholesterol: 177 mg/dL   Physical Exam:    VS:  BP (!) 148/104   Pulse 64   Ht 5' 3"  (1.6 m)   Wt 185 lb 6.4 oz (84.1 kg)   SpO2 97%  BMI 32.84 kg/m     Wt Readings from Last 3 Encounters:  01/09/21 185 lb 6.4 oz (84.1 kg)  12/16/20 188 lb 3.2 oz (85.4 kg)  12/02/20 185 lb 6.5 oz (84.1 kg)     GEN:  Well nourished, well developed in no acute distress HEENT: Normal NECK: No JVD; No carotid bruits LYMPHATICS: No lymphadenopathy CARDIAC: RRR, no murmurs, rubs, gallops RESPIRATORY:  Clear to auscultation without rales, wheezing or rhonchi  ABDOMEN: Soft, non-tender, non-distended MUSCULOSKELETAL:  No edema; No deformity  SKIN: Warm and dry NEUROLOGIC:  Alert and oriented x 3 PSYCHIATRIC:  Normal affect   ASSESSMENT:    1. DOE (dyspnea on exertion)   2. Essential hypertension   3. Nonspecific abnormal electrocardiogram (ECG) (EKG)   4. Diabetes mellitus with coincident hypertension ([place])   5. LVH (left ventricular hypertrophy)    PLAN:    In order of problems listed above:  Dyspnea of Exertion Left Ventricular Hypertrophy - The patient presents with possibly cardiac- DOE and anginal equivalent in the setting of DM - EKG shows baseline TWI - ASCVD risk estimated at 14.7% - Would recommend an echocardiogram to assess LVEF and exclude WMA, also for the evaluation of LVH progression.  - Would recommend exercisenuclear medicine stress test (NPO at midnight); discussed risks, benefits, and alternatives of the diagnostic procedure including chest pain, arrhythmia, and death.  Patient amenable for testing.  Diabetes with hypertension - ambulatory blood pressure not done, will start  ambulatory BP monitoring; gave education on how to perform ambulatory blood pressure monitoring including the frequency and technique; goal ambulatory blood pressure < 135/85 on average - continue home medications; patient deferred new medications in the past as she was previously on many medication ([place] 2018- had secondary BP assessment at that time) without improvement - discussed diet (DASH/low sodium), and exercise/weight loss interventions   HLD- will address at next visit base on stress  3 month follow up unless new symptoms or abnormal test results warranting change in plan  Would be reasonable for  Virtual Follow up  Would be reasonable for  APP Follow up   Shared Decision Making/Informed Consent The risks [chest pain, shortness of breath, cardiac arrhythmias, dizziness, blood pressure fluctuations, myocardial infarction, stroke/transient ischemic attack, nausea, vomiting, allergic reaction, radiation exposure, metallic taste sensation and life-threatening complications (estimated to be 1 in 10,000)], benefits (risk stratification, diagnosing coronary artery disease, treatment guidance) and alternatives of a nuclear stress test were discussed in detail with Ms. Simones and she agrees to proceed.       Medication Adjustments/Labs and Tests Ordered: Current medicines are reviewed at length with the patient today.  Concerns regarding medicines are outlined above.  Orders Placed This Encounter  Procedures  . MYOCARDIAL PERFUSION IMAGING  . EKG 12-Lead  . ECHOCARDIOGRAM COMPLETE   No orders of the defined types were placed in this encounter.   Patient Instructions  Medication Instructions:  Your physician recommends that you continue on your current medications as directed. Please refer to the Current Medication list given to you today.  *If you need a refill on your cardiac medications before your next appointment, please call your pharmacy*   Lab Work: None  ordered  If you have labs (blood work) drawn today and your tests are completely normal, you will receive your results only by: Marland Kitchen MyChart Message (if you have MyChart) OR . A paper copy in the mail If you have any lab test that is abnormal or we need to  change your treatment, we will call you to review the results.   Testing/Procedures: Your physician has requested that you have en exercise stress myoview. For further information please visit [email or web address]. Please follow instruction sheet, as given.  You will also need to be covid tested prior to your stress test.  They will let you know when and where to go for this once they schedule your test.   Your physician has requested that you have an echocardiogram. Echocardiography is a painless test that uses sound waves to create images of your heart. It provides your doctor with information about the size and shape of your heart and how well your heart's chambers and valves are working. This procedure takes approximately one hour. There are no restrictions for this procedure.     Follow-Up: At [facility], you and your health needs are our priority.  As part of our continuing mission to provide you with exceptional heart care, we have created designated Provider Care Teams.  These Care Teams include your primary Cardiologist (physician) and Advanced Practice Providers (APPs -  Physician Assistants and Nurse Practitioners) who all work together to provide you with the care you need, when you need it.  We recommend signing up for the patient portal called "MyChart".  Sign up information is provided on this After Visit Summary.  MyChart is used to connect with patients for Virtual Visits (Telemedicine).  Patients are able to view lab/test results, encounter notes, upcoming appointments, etc.  Non-urgent messages can be sent to your provider as well.   To learn more about what you can do with MyChart, go to [email or web address].    Your  next appointment:   3 month(s)  The format for your next appointment:   In Person  Provider:   Rudean Haskell, MD   Other Instructions      Signed, Werner Lean, MD  01/09/2021 8:49 AM    [place]

## 2021-01-10 ENCOUNTER — Other Ambulatory Visit: Payer: Self-pay | Admitting: Family Medicine

## 2021-01-10 DIAGNOSIS — Z1231 Encounter for screening mammogram for malignant neoplasm of breast: Secondary | ICD-10-CM

## 2021-01-11 NOTE — Addendum Note (Signed)
Addended by: Rudean Haskell A on: 01/11/2021 10:28 AM   Modules accepted: Orders

## 2021-01-11 NOTE — Addendum Note (Signed)
Addended by: Gaetano Net on: 01/11/2021 09:30 AM   Modules accepted: Orders

## 2021-01-24 ENCOUNTER — Ambulatory Visit (INDEPENDENT_AMBULATORY_CARE_PROVIDER_SITE_OTHER): Payer: 59 | Admitting: Psychology

## 2021-01-24 DIAGNOSIS — F4321 Adjustment disorder with depressed mood: Secondary | ICD-10-CM

## 2021-01-25 ENCOUNTER — Telehealth (HOSPITAL_COMMUNITY): Payer: Self-pay

## 2021-01-25 NOTE — Telephone Encounter (Signed)
Detailed instructions left on the patient's answering machine. Asked to call back with any questions. S.Liana Camerer EMTP 

## 2021-01-27 ENCOUNTER — Other Ambulatory Visit (HOSPITAL_COMMUNITY)
Admission: RE | Admit: 2021-01-27 | Discharge: 2021-01-27 | Disposition: A | Discharge status: Home / Self Care | Payer: Managed Care, Other (non HMO) | Source: Ambulatory Visit | Attending: Internal Medicine | Admitting: Internal Medicine

## 2021-01-27 DIAGNOSIS — Z01812 Encounter for preprocedural laboratory examination: Secondary | ICD-10-CM | POA: Insufficient documentation

## 2021-01-27 DIAGNOSIS — Z20822 Contact with and (suspected) exposure to covid-19: Secondary | ICD-10-CM | POA: Insufficient documentation

## 2021-01-27 LAB — SARS CORONAVIRUS 2 (TAT 6-24 HRS): SARS Coronavirus 2: NEGATIVE

## 2021-01-31 ENCOUNTER — Ambulatory Visit (HOSPITAL_BASED_OUTPATIENT_CLINIC_OR_DEPARTMENT_OTHER): Payer: Managed Care, Other (non HMO)

## 2021-01-31 ENCOUNTER — Ambulatory Visit (HOSPITAL_COMMUNITY): Payer: Managed Care, Other (non HMO) | Attending: Cardiology

## 2021-01-31 ENCOUNTER — Other Ambulatory Visit: Payer: Self-pay

## 2021-01-31 DIAGNOSIS — R0600 Dyspnea, unspecified: Secondary | ICD-10-CM | POA: Insufficient documentation

## 2021-01-31 DIAGNOSIS — R0609 Other forms of dyspnea: Secondary | ICD-10-CM

## 2021-01-31 DIAGNOSIS — I10 Essential (primary) hypertension: Secondary | ICD-10-CM | POA: Insufficient documentation

## 2021-01-31 LAB — MYOCARDIAL PERFUSION IMAGING
LV dias vol: 79 mL (ref 46–106)
LV sys vol: 39 mL
Peak HR: 110 {beats}/min
Rest HR: 79 {beats}/min
SDS: 1
SRS: 0
SSS: 1
TID: 0.96

## 2021-01-31 LAB — ECHOCARDIOGRAM COMPLETE
Area-P 1/2: 3.48 cm2
Height: 63 in
S' Lateral: 2.5 cm
Weight: 2960 oz

## 2021-01-31 MED ORDER — REGADENOSON 0.4 MG/5ML IV SOLN
0.4000 mg | Freq: Once | INTRAVENOUS | Status: AC
  Administered 2021-01-31: 0.4 mg via INTRAVENOUS

## 2021-01-31 MED ORDER — TECHNETIUM TC 99M TETROFOSMIN IV KIT
31.4000 | PACK | Freq: Once | INTRAVENOUS | Status: AC | PRN
  Administered 2021-01-31: 31.4 via INTRAVENOUS
  Filled 2021-01-31: qty 32

## 2021-01-31 MED ORDER — TECHNETIUM TC 99M TETROFOSMIN IV KIT
10.9000 | PACK | Freq: Once | INTRAVENOUS | Status: AC | PRN
  Administered 2021-01-31: 10.9 via INTRAVENOUS
  Filled 2021-01-31: qty 11

## 2021-02-01 ENCOUNTER — Telehealth: Payer: Self-pay | Admitting: *Deleted

## 2021-02-01 DIAGNOSIS — I10 Essential (primary) hypertension: Secondary | ICD-10-CM

## 2021-02-01 MED ORDER — LABETALOL HCL 100 MG PO TABS: 100.0000 mg | ORAL_TABLET | Freq: Two times a day (BID) | ORAL | 3 refills | Status: DC

## 2021-02-01 NOTE — Telephone Encounter (Signed)
-----   Message from Werner Lean, MD sent at 02/01/2021  9:50 AM EST ----- Results: No evidence of blockage from pictures, but your pressure was significantly elevated Plan: Bring back in March for BP control; sooner if symptoms persist Labetalol 100 mg PO BID  Werner Lean, MD

## 2021-02-01 NOTE — Telephone Encounter (Signed)
Pt has been made aware of all test results by phone with verbal understanding. Pt agreeable to start Labetalol 100 mg BID, Rx has been sent in to [facility]. Pt agreeable to plan of care to move appt up sooner per MD, scheduled for 02/28/21 with Dr. Gasper Sells. Pt thanked me for the call and the help. Patient notified of result.  Please refer to phone note from today for complete details.   Julaine Hua, [facility] 02/01/2021 12:34 PM

## 2021-02-07 ENCOUNTER — Ambulatory Visit (INDEPENDENT_AMBULATORY_CARE_PROVIDER_SITE_OTHER): Payer: 59 | Admitting: Psychology

## 2021-02-07 DIAGNOSIS — F4321 Adjustment disorder with depressed mood: Secondary | ICD-10-CM

## 2021-02-21 ENCOUNTER — Ambulatory Visit: Payer: 59 | Admitting: Psychology

## 2021-02-21 ENCOUNTER — Ambulatory Visit: Payer: Managed Care, Other (non HMO)

## 2021-02-28 ENCOUNTER — Ambulatory Visit: Payer: Managed Care, Other (non HMO) | Admitting: Internal Medicine

## 2021-03-31 ENCOUNTER — Ambulatory Visit
Admission: RE | Admit: 2021-03-31 | Discharge: 2021-03-31 | Disposition: A | Discharge status: Home / Self Care | Payer: Managed Care, Other (non HMO) | Source: Ambulatory Visit | Attending: Family Medicine | Admitting: Family Medicine

## 2021-03-31 ENCOUNTER — Other Ambulatory Visit: Payer: Self-pay

## 2021-03-31 DIAGNOSIS — Z1231 Encounter for screening mammogram for malignant neoplasm of breast: Secondary | ICD-10-CM

## 2021-04-10 NOTE — Progress Notes (Signed)
Cardiology Office Note:    Date:  04/17/2021   ID:  Melanie Little, DOB Jul 07, 1971, MRN [id number]  PCP:  Alycia Rossetti, MD  [facility] Cardiologist:  Rudean Haskell MD [place] Electrophysiologist:  None   CC: Follow up BP  History of Present Illness:    Melanie Little is a 50 y.o. female with a hx of DM with  HTN with HTN Urgency, OSA on CPAP who presents for evaluation 01/09/21.  In interim of this visit, patient had normal stress test with hypertensive response.  Seen 04/17/21.  Patient notes that she is doing ok.  Since day prior/last visit notes no changes.  Relevant interval testing or therapy include NM Stress.  There are no interval hospital/ED visit.    No chest pain or pressure.  Gets out of breath easily and no PND/Orthopnea.  No weight gain or leg swelling.  No palpitations or syncope.  Ambulatory blood pressure 156-170 SBP on average.   Past Medical History:  Diagnosis Date  . Abnormal EKG   . Abnormal Pap smear of cervix about 2013, 2016   Colpo biopsy with normal result per pt.  colpo 2016 HPV changes  . DUB (dysfunctional uterine bleeding)    around 2012, saw gyn at the time for eval  . Elevated hemoglobin A1c   . Fibroid    multiple fibroids  . Hypertension   . Left ovarian cyst 05/2018   do yearly ultrasound.  Doing GYN ONC consulation for monitoring.   . Migraine   . OSA on CPAP     Past Surgical History:  Procedure Laterality Date  . BREAST REDUCTION SURGERY  2005  . CHOLECYSTECTOMY  2004  . INCISION AND DRAINAGE Bilateral 2015   Bilateral axillary region ebaceous cyst  . MASS EXCISION N/A 12/02/2020   Procedure: EXCISION OF TWO MIDLINE BACK SUBCUTANEOUS MASSES;  Surgeon: Felicie Morn, MD;  Location: [place];  Service: General;  Laterality: N/A;  . REDUCTION MAMMAPLASTY      Current Medications: Current Meds  Medication Sig  . losartan (COZAAR) 100 MG tablet TAKE 1 TABLET(100 MG) BY MOUTH DAILY  .  nebivolol (BYSTOLIC) 10 MG tablet Take 1 tablet (10 mg total) by mouth daily.  . norethindrone (NORLYDA) 0.35 MG tablet TAKE 1 TABLET(0.35 MG) BY MOUTH DAILY  . [DISCONTINUED] hydrOXYzine (ATARAX/VISTARIL) 10 MG tablet Take 1 tablet (10 mg total) by mouth 3 (three) times daily as needed.  . [DISCONTINUED] labetalol (NORMODYNE) 100 MG tablet Take 1 tablet (100 mg total) by mouth 2 (two) times daily.  . [DISCONTINUED] metFORMIN (GLUCOPHAGE) 500 MG tablet Take 1 tablet (500 mg total) by mouth 2 (two) times daily with a meal.  . [DISCONTINUED] terbinafine (LAMISIL) 250 MG tablet Take 1 tablet (250 mg total) by mouth daily.     Allergies:   Patient has no known allergies.   Social History   Socioeconomic History  . Marital status: Married    Spouse name: Not on file  . Number of children: 1  . Years of education: Not on file  . Highest education level: Not on file  Occupational History  . Occupation: [employer]  Tobacco Use  . Smoking status: Never Smoker  . Smokeless tobacco: Never Used  Vaping Use  . Vaping Use: Never used  Substance and Sexual Activity  . Alcohol use: No    Alcohol/week: 0.0 standard drinks  . Drug use: No  . Sexual activity: Yes    Birth  control/protection: Pill    Comment: Micronor  Other Topics Concern  . 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  History of coronary artery disease notable for no members. History of heart failure notable for possible mother.  History of arrhythmia notable for atrial fibrillation.  ROS:   Please see the history of present illness.     All other systems reviewed and are negative.  EKGs/Labs/Other Studies Reviewed:    The following studies were reviewed today:  EKG:  01/09/21: SR rate 64 latera TWI 11/30/20:  SR rate 67 lateral TWI  Transthoracic Echocardiogram: Date: 11/04/2017 Results: Moderate to Severe Concentric Hypertrophy Study Conclusions  - Left ventricle: The cavity size was normal. There was moderate  concentric hypertrophy. Systolic function was normal. The  estimated ejection fraction was in the range of 55% to 60%. Wall  motion was normal; there were no regional wall motion  abnormalities. Left ventricular diastolic function parameters  were normal.  - Pulmonary arteries: Systolic pressure could not be accurately  estimated.  Transthoracic Echocardiogram: Date: 01/31/21 Results: Moderate LVH without regional strain abnormalities  NM Stress Testing : Date: 01/31/21 Results:  Nuclear stress EF: 50%.  Blood pressure demonstrated a hypertensive response to exercise.  ST segment elevation was noted during stress in the aVR leads.  The study is normal.  This is a low risk study.  The left ventricular ejection fraction is mildly decreased (45-54%).   Very elevated blood pressure, initially 207/110 and then 235/117 after lexiscan. Concerning ECG changes with aVR elevation and horizontal ST depressions as noted. However, normal perfusion without evidence of ischemia. The EF calculates as low normal but visually appears normal.  Recent Labs: 11/09/2020: ALT 12; Hemoglobin 13.4; Platelets 475; TSH 2.03 11/30/2020: BUN 9; Creatinine,  Ser 0.68; Potassium 3.6; Sodium 140  Recent Lipid Panel    Component Value Date/Time   CHOL 177 01/29/2020 0821   TRIG 148 01/29/2020 0821   HDL 46 (L) 01/29/2020 0821   CHOLHDL 3.8 01/29/2020 0821   VLDL 30.4 12/03/2016 1233   LDLCALC 105 (H) 01/29/2020 0821   Risk Assessment/Calculations:     The 10-year ASCVD risk score Mikey Bussing DC Brooke Bonito., et al., 2013) is: 31.2%   Values used to calculate the score:     Age: 81 years     Sex: Female     Is Non-Hispanic African American: Yes     Diabetic: Yes     Tobacco smoker: No     Systolic Blood Pressure: 341 mmHg     Is BP treated: Yes     HDL Cholesterol: 46 mg/dL     Total Cholesterol: 177 mg/dL   Physical Exam:  VS:  BP (!) 180/102   Pulse 81   Ht 5' 4"  (1.626 m)   Wt 194 lb (88 kg)   LMP 03/31/2021   SpO2 97%   BMI 33.30 kg/m     Wt Readings from Last 3 Encounters:  04/17/21 194 lb (88 kg)  01/31/21 185 lb (83.9 kg)  01/09/21 185 lb 6.4 oz (84.1 kg)     GEN:  Well nourished, well developed in no acute distress HEENT: Normal NECK: No JVD; No carotid bruits LYMPHATICS: No lymphadenopathy CARDIAC: RRR, no murmurs, rubs, gallops RESPIRATORY:  Clear to auscultation without rales, wheezing or rhonchi  ABDOMEN: Soft, non-tender, non-distended MUSCULOSKELETAL:  No edema; No deformity  SKIN: Warm and dry NEUROLOGIC:  Alert and oriented x 3 PSYCHIATRIC:  Normal affect   ASSESSMENT:    1. Diabetes mellitus with coincident hypertension ([place])   2. LVH (left ventricular hypertrophy)   3. OSA (obstructive sleep apnea)   4. Mixed hyperlipidemia    PLAN:    In order of problems listed above:  Diabetes with hypertension LVH OSA on CPAP - ambulatory blood pressure 150-170, will continue ambulatory BP monitoring; gave education on how to perform ambulatory blood pressure monitoring including the frequency and technique; goal ambulatory blood pressure < 135/85 on average - continue home medications- switching labetalol to  bystolic 10 mg; will return to labetalol if insurance issues occur , losartan continued  - had norvasc and HCTZ related chest pain and swelling - discussed diet (DASH/low sodium), and exercise/weight loss interventions  - deferred Pharmd D clinic - discussed other pharmacologic options with patient  HLD with ASCVD risk greater than 10% - patient deferred lipids check  Summer follow up unless new symptoms or abnormal test results warranting change in plan  Would be reasonable for  Video Visit Follow up Would be reasonable for  APP Follow up  Medication Adjustments/Labs and Tests Ordered: Current medicines are reviewed at length with the patient today.  Concerns regarding medicines are outlined above.  No orders of the defined types were placed in this encounter.  Meds ordered this encounter  Medications  . nebivolol (BYSTOLIC) 10 MG tablet    Sig: Take 1 tablet (10 mg total) by mouth daily.    Dispense:  90 tablet    Refill:  3    Patient Instructions  Medication Instructions:  Your physician has recommended you make the following change in your medication:  STOP: labetalol  START: nebivolol B(ystolic) 10 mg by mouth daily   *If you need a refill on your cardiac medications before your next appointment, please call your pharmacy*   Lab Work: NONE If you have labs (blood work) drawn today and your tests are completely normal, you will receive your results only by: Marland Kitchen MyChart Message (if you have MyChart) OR . A paper copy in the mail If you have any lab test that is abnormal or we need to change your treatment, we will call you to review the results.   Testing/Procedures: NONE   Follow-Up: At [facility], you and your health needs are our priority.  As part of our continuing mission to provide you with exceptional heart care, we have created designated Provider Care Teams.  These Care Teams include your primary Cardiologist (physician) and Advanced Practice Providers  (APPs -  Physician Assistants and Nurse Practitioners) who all work together to provide you with the care you need, when you need it.  We recommend signing up for the patient portal called "  MyChart".  Sign up information is provided on this After Visit Summary.  MyChart is used to connect with patients for Virtual Visits (Telemedicine).  Patients are able to view lab/test results, encounter notes, upcoming appointments, etc.  Non-urgent messages can be sent to your provider as well.   To learn more about what you can do with MyChart, go to [email or web address].    Your next appointment:   3-4 month(s)  The format for your next appointment:   In Person  Provider:   You may see Rudean Haskell, MD or one of the following Advanced Practice Providers on your designated Care Team:    Melina Copa, PA-C  Ermalinda Barrios, PA-C    Other Instructions Please check your BP daily      Signed, Werner Lean, MD  04/17/2021 8:35 AM    [place]

## 2021-04-17 ENCOUNTER — Other Ambulatory Visit: Payer: Self-pay

## 2021-04-17 ENCOUNTER — Ambulatory Visit: Payer: Managed Care, Other (non HMO) | Admitting: Internal Medicine

## 2021-04-17 ENCOUNTER — Encounter: Payer: Self-pay | Admitting: Internal Medicine

## 2021-04-17 VITALS — BP 180/102 | HR 81 | Ht 64.0 in | Wt 194.0 lb

## 2021-04-17 DIAGNOSIS — I517 Cardiomegaly: Secondary | ICD-10-CM

## 2021-04-17 DIAGNOSIS — E782 Mixed hyperlipidemia: Secondary | ICD-10-CM

## 2021-04-17 DIAGNOSIS — E119 Type 2 diabetes mellitus without complications: Secondary | ICD-10-CM

## 2021-04-17 DIAGNOSIS — I10 Essential (primary) hypertension: Secondary | ICD-10-CM

## 2021-04-17 DIAGNOSIS — G4733 Obstructive sleep apnea (adult) (pediatric): Secondary | ICD-10-CM | POA: Diagnosis not present

## 2021-04-17 MED ORDER — NEBIVOLOL HCL 10 MG PO TABS: 10.0000 mg | ORAL_TABLET | Freq: Every day | ORAL | 3 refills | Status: DC

## 2021-04-17 NOTE — Patient Instructions (Signed)
Medication Instructions:  Your physician has recommended you make the following change in your medication:  STOP: labetalol  START: nebivolol B(ystolic) 10 mg by mouth daily   *If you need a refill on your cardiac medications before your next appointment, please call your pharmacy*   Lab Work: NONE If you have labs (blood work) drawn today and your tests are completely normal, you will receive your results only by: Marland Kitchen MyChart Message (if you have MyChart) OR . A paper copy in the mail If you have any lab test that is abnormal or we need to change your treatment, we will call you to review the results.   Testing/Procedures: NONE   Follow-Up: At [facility], you and your health needs are our priority.  As part of our continuing mission to provide you with exceptional heart care, we have created designated Provider Care Teams.  These Care Teams include your primary Cardiologist (physician) and Advanced Practice Providers (APPs -  Physician Assistants and Nurse Practitioners) who all work together to provide you with the care you need, when you need it.  We recommend signing up for the patient portal called "MyChart".  Sign up information is provided on this After Visit Summary.  MyChart is used to connect with patients for Virtual Visits (Telemedicine).  Patients are able to view lab/test results, encounter notes, upcoming appointments, etc.  Non-urgent messages can be sent to your provider as well.   To learn more about what you can do with MyChart, go to [email or web address].    Your next appointment:   3-4 month(s)  The format for your next appointment:   In Person  Provider:   You may see Rudean Haskell, MD or one of the following Advanced Practice Providers on your designated Care Team:    Melina Copa, PA-C  Ermalinda Barrios, PA-C    Other Instructions Please check your BP daily

## 2021-04-28 ENCOUNTER — Other Ambulatory Visit: Payer: Self-pay

## 2021-04-28 ENCOUNTER — Encounter: Payer: Self-pay | Admitting: Family Medicine

## 2021-04-28 ENCOUNTER — Ambulatory Visit: Payer: Managed Care, Other (non HMO) | Admitting: Family Medicine

## 2021-04-28 VITALS — BP 169/102 | HR 66 | Ht 64.0 in | Wt 192.2 lb

## 2021-04-28 DIAGNOSIS — Z1159 Encounter for screening for other viral diseases: Secondary | ICD-10-CM

## 2021-04-28 DIAGNOSIS — E1121 Type 2 diabetes mellitus with diabetic nephropathy: Secondary | ICD-10-CM | POA: Diagnosis not present

## 2021-04-28 DIAGNOSIS — Z30018 Encounter for initial prescription of other contraceptives: Secondary | ICD-10-CM

## 2021-04-28 DIAGNOSIS — E119 Type 2 diabetes mellitus without complications: Secondary | ICD-10-CM | POA: Diagnosis not present

## 2021-04-28 DIAGNOSIS — Z3041 Encounter for surveillance of contraceptive pills: Secondary | ICD-10-CM

## 2021-04-28 DIAGNOSIS — I10 Essential (primary) hypertension: Secondary | ICD-10-CM

## 2021-04-28 MED ORDER — NORETHINDRONE 0.35 MG PO TABS: ORAL_TABLET | ORAL | 3 refills | Status: DC

## 2021-04-28 MED ORDER — OZEMPIC (0.25 OR 0.5 MG/DOSE) 2 MG/1.5ML ~~LOC~~ SOPN: 0.2500 mg | PEN_INJECTOR | SUBCUTANEOUS | 0 refills | Status: DC

## 2021-04-28 NOTE — Progress Notes (Signed)
   SUBJECTIVE:   CHIEF COMPLAINT / HPI:   Chief Complaint  Patient presents with  . Establish Care  . Medication Refill  . Medication Problem     Melanie Little is a 50 y.o. female here for establish care. Requests refills and discuss medication.   HTN  Patient reports uncontrolled HTN despite renal workup and several medications. Reprots taking medications as prescribed. Currently taking Bystolic and Losartan. Follows with cardiology.   Pt reports see is prediabetic. She stopped taking Metformin. Is taking an injectable that helps with weight loss.   Requests refill on birth control. Patient's last menstrual period was 04/21/2021.   PERTINENT  PMH / PSH: reviewed and updated as appropriate   OBJECTIVE:   BP (!) 169/102   Pulse 66   Ht 5' 4"  (1.626 m)   Wt 192 lb 3.2 oz (87.2 kg)   LMP 04/21/2021   SpO2 (!) 65%   BMI 32.99 kg/m    GEN: well appearing female, in no acute distress  CV: regular rate and rhythm, no murmurs appreciated RESP: no increased work of breathing, clear to ascultation bilaterally MSK: no LE edema SKIN: warm, dry    ASSESSMENT/PLAN:   Diabetes mellitus type 2, controlled (HCC) Stable. A1c today 7.0 and previously 6.8. Discussed switching from Trulicity to [employer] given insurance preference. Start Ozempic 0.80m. Encouraged continued diet rich in vegetables and complex carbs.  Heart healthy carb modified diet. Counseled on need to exercise.   Statin therapy: obtain lipid panel. Will recommend starting statin at follow up  ACEi/ARB: Losartan  Foot exam: Needs  Urine microalbumine: Needs  Eye exam: Needs    Essential hypertension Uncontrolled. BP 169/102 not at goal. Pt concerned about Bystolic. Per chart review medication was stopped due to cost and switched to Coreg and Aldactone. Pt agreeable to continue Bystolic. Pt has not tolerated amlodipine, HCTZ. Cardiology stopped hydralazine. Continue Bystolic and Losartan. Defer medication changes  to cardiology.  Exercise: Encouraged to increase physical activity as tolerated.  Diet Pattern: Heart healthy dietary choices discussed.  Lipid panel and a1c today      Encounter for birth control pills maintenance Patient's last menstrual period was 04/21/2021.  Refilled Norlyda.     Health care maintenance: Discussed and pt low risk. Will obtain Hep C screening.   VLyndee Hensen DO PGY-2, [facility] 04/28/2021

## 2021-04-28 NOTE — Patient Instructions (Signed)
It was great seeing you today!  Please check-out at the front desk before leaving the clinic. I'd like to see you back in 3 but if you need to be seen earlier than that for any new issues we're happy to fit you in, just give [place] a call!  Visit Remembers: - Stop by the pharmacy to pick up your prescriptions  - Continue to work on your healthy eating habits and incorporating exercise into your daily life. (see below) - Your goal is to have an A1c < 7    Diet Recommendations for Diabetes  Carbohydrate includes starch, sugar, and fiber.  Of these, only sugar and starch raise blood glucose.  (Fiber is found in fruits, vegetables [especially skin, seeds, and stalks] and whole grains.)   Starchy (carb) foods: Bread, rice, pasta, potatoes, corn, cereal, grits, crackers, bagels, muffins, all baked goods.  (Fruit, milk, and yogurt also have carbohydrate, but most of these foods will not spike your blood sugar as most starchy foods will.)  A few fruits do cause high blood sugars; use small portions of bananas (limit to 1/2 at a time), grapes, watermelon, oranges, and most tropical fruits.   Protein foods: Meat, fish, poultry, eggs, dairy foods, and beans such as pinto and kidney beans (beans also provide carbohydrate).   1. Eat at least REAL 3 meals and 1-2 snacks per day. Never go more than 4-5 hours while awake without eating. Eat breakfast within the first hour of getting up.   2. Limit starchy foods to TWO per meal and ONE per snack. ONE portion of a starchy food is equal to the following:   - ONE slice of bread (or its equivalent, such as half of a hamburger bun).   - 1/2 cup of a "scoopable" starchy food such as potatoes or rice.   - 15 grams of Total Carbohydrate as shown on food label.   - Every 4 ounces of a sweet drink (including fruit juice). 3. Include at every meal: a protein food, a carb food, and vegetables and/or fruit.   - Obtain twice the volume of veg's as protein or carbohydrate  foods for both lunch and dinner.   - Fresh or frozen veg's are best.   - Keep frozen veg's on hand for a quick vegetable serving.       Regarding lab work today:  Due to recent changes in healthcare laws, you may see the results of your imaging and laboratory studies on MyChart before your doctor has had a chance to review them.  We understand that in some cases there may be results that are confusing or concerning to you. Not all laboratory results come back in the same time frame and your doctor may be waiting for multiple results in order to interpret others.  Please give [place] 72 hours in order for your doctor to thoroughly review all the results before contacting the office for clarification of your results. If everything is normal, you will get a letter in the mail or a message in My Chart. Please give [place] a call if you do not hear from [place] after 2 weeks.  Please bring all of your medications with you to each visit.    If you haven't already, sign up for My Chart to have easy access to your labs results, and communication with your primary care physician.  Feel free to call with any questions or concerns at any time, at [phone].   Take care,  Dr. Rushie Chestnut  [place]

## 2021-04-29 ENCOUNTER — Other Ambulatory Visit: Payer: Self-pay | Admitting: Family Medicine

## 2021-04-29 LAB — HEPATITIS C ANTIBODY: Hep C Virus Ab: <0.1 s/co ratio (ref 0.0–0.9)

## 2021-04-29 LAB — LIPID PANEL
Chol/HDL Ratio: 3.9 ratio (ref 0.0–4.4)
Cholesterol, Total: 167 mg/dL (ref 100–199)
HDL: 43 mg/dL (ref >39)
LDL Chol Calc (NIH): 94 mg/dL (ref 0–99)
Triglycerides: 175 mg/dL — ABNORMAL HIGH (ref 0–149)
VLDL Cholesterol Cal: 30 mg/dL (ref 5–40)

## 2021-04-29 LAB — HEMOGLOBIN A1C
Est. average glucose Bld gHb Est-mCnc: 154 mg/dL
Hgb A1c MFr Bld: 7 % — ABNORMAL HIGH (ref 4.8–5.6)

## 2021-05-01 ENCOUNTER — Encounter: Payer: Self-pay | Admitting: Family Medicine

## 2021-05-01 ENCOUNTER — Telehealth: Payer: Self-pay

## 2021-05-01 NOTE — Telephone Encounter (Signed)
Received fax from pharmacy, PA needed on Ozempic. Clinical questions submitted via Cover My Meds. Waiting on response, could take up to 72 hours.  Cover My Meds info: Key: [id number]  The medication has been approved for 25 dollar copay and the pharmacy has been updated as well as the patient.

## 2021-05-02 DIAGNOSIS — I1A0 Resistant hypertension: Secondary | ICD-10-CM | POA: Insufficient documentation

## 2021-05-02 DIAGNOSIS — I10 Essential (primary) hypertension: Secondary | ICD-10-CM

## 2021-05-02 DIAGNOSIS — Z3041 Encounter for surveillance of contraceptive pills: Secondary | ICD-10-CM | POA: Insufficient documentation

## 2021-05-02 HISTORY — DX: Resistant hypertension: I1A.0

## 2021-05-02 HISTORY — DX: Essential (primary) hypertension: I10

## 2021-05-02 NOTE — Assessment & Plan Note (Signed)
Patient's last menstrual period was 04/21/2021.  Refilled Norlyda.

## 2021-05-02 NOTE — Assessment & Plan Note (Addendum)
Stable. A1c today 7.0 and previously 6.8. Discussed switching from Trulicity to [employer] given insurance preference. Start Ozempic 0.63m. Encouraged continued diet rich in vegetables and complex carbs.  Heart healthy carb modified diet. Counseled on need to exercise.   Statin therapy: obtain lipid panel. Will recommend starting statin at follow up  ACEi/ARB: Losartan  Foot exam: Needs  Urine microalbumine: Needs  Eye exam: Needs

## 2021-05-02 NOTE — Assessment & Plan Note (Addendum)
Uncontrolled. BP 169/102 not at goal. Pt concerned about Bystolic. Per chart review medication was stopped due to cost and switched to Coreg and Aldactone. Pt agreeable to continue Bystolic. Pt has not tolerated amlodipine, HCTZ. Cardiology stopped hydralazine. Continue Bystolic and Losartan. Defer medication changes to cardiology.  Exercise: Encouraged to increase physical activity as tolerated.  Diet Pattern: Heart healthy dietary choices discussed.  Lipid panel and a1c today

## 2021-05-03 ENCOUNTER — Other Ambulatory Visit: Payer: Self-pay | Admitting: Family Medicine

## 2021-05-03 DIAGNOSIS — E119 Type 2 diabetes mellitus without complications: Secondary | ICD-10-CM

## 2021-05-03 MED ORDER — OZEMPIC (0.25 OR 0.5 MG/DOSE) 2 MG/1.5ML ~~LOC~~ SOPN: 0.2500 mg | PEN_INJECTOR | SUBCUTANEOUS | 2 refills | Status: DC

## 2021-05-11 ENCOUNTER — Other Ambulatory Visit: Payer: Self-pay | Admitting: Family Medicine

## 2021-05-11 DIAGNOSIS — E1121 Type 2 diabetes mellitus with diabetic nephropathy: Secondary | ICD-10-CM

## 2021-05-11 MED ORDER — ONETOUCH ULTRA MINI W/DEVICE KIT: 1.0000 | PACK | Freq: Three times a day (TID) | 1 refills | Status: DC | PRN

## 2021-05-15 ENCOUNTER — Other Ambulatory Visit: Payer: Self-pay | Admitting: Family Medicine

## 2021-05-15 DIAGNOSIS — E1121 Type 2 diabetes mellitus with diabetic nephropathy: Secondary | ICD-10-CM

## 2021-05-15 MED ORDER — ONETOUCH ULTRASOFT LANCETS MISC: 12 refills | Status: DC

## 2021-05-15 MED ORDER — GLUCOSE BLOOD VI STRP: ORAL_STRIP | 12 refills | Status: DC

## 2021-05-29 ENCOUNTER — Other Ambulatory Visit: Payer: Self-pay | Admitting: Family Medicine

## 2021-05-29 DIAGNOSIS — E119 Type 2 diabetes mellitus without complications: Secondary | ICD-10-CM

## 2021-08-02 ENCOUNTER — Other Ambulatory Visit: Payer: Self-pay

## 2021-08-02 ENCOUNTER — Encounter: Payer: Self-pay | Admitting: Adult Health

## 2021-08-02 ENCOUNTER — Ambulatory Visit: Payer: Managed Care, Other (non HMO) | Admitting: Adult Health

## 2021-08-02 VITALS — BP 172/92 | HR 75 | Ht 64.0 in | Wt 196.0 lb

## 2021-08-02 DIAGNOSIS — Z9989 Dependence on other enabling machines and devices: Secondary | ICD-10-CM

## 2021-08-02 DIAGNOSIS — G4733 Obstructive sleep apnea (adult) (pediatric): Secondary | ICD-10-CM

## 2021-08-02 NOTE — Patient Instructions (Signed)
Your Plan:  Continue using CPAP nightly and greater than 4 hours each night If your symptoms worsen or you develop new symptoms please let us know.       Thank you for coming to see [place] at [facility]. I hope we have been able to provide you high quality care today.  You may receive a patient satisfaction survey over the next few weeks. We would appreciate your feedback and comments so that we may continue to improve ourselves and the health of our patients.

## 2021-08-02 NOTE — Progress Notes (Signed)
PATIENT: Gracieann Ishida DOB: [birth year]  REASON FOR VISIT: follow up HISTORY FROM: patient Primary neurologist: Dr. Brett Fairy  HISTORY OF PRESENT ILLNESS: Today 08/02/21:  Ms. Meas is a 50 year old female with a history of obstructive sleep apnea on CPAP.  She returns today for follow-up.  Her download indicates good compliance.  She denies any new issues.  Reports that the CPAP is working well for her.  She returns today for follow-up.    08/01/20: Ms. Waldrip is a 50 year old female with a history of obstructive sleep apnea on CPAP.  Her download indicates that she use her machine nightly for compliance of 100%.  She is averaging greater than 4 hours each night.  On average she uses her machine 7 hours and 39 minutes.  Her residual AHI is 1.9 on 6 cm of water with EPR of 2.  Leak in the 95th percentile is 22.6 L/min.  She reports that the CPAP is working well.  She returns today for an evaluation.  HISTORY (Copied from Dr.Dohmeier's note) -28-2020, patient reports stiff neck, no stuffy nose and  Good compliance with CPAP.  I have the pleasure of meeting today with Dasiah Hooley, a 50 year old left handed AAF with OSA on CPAP. She endorsed the Epworth sleepiness score at 1 out of 24 possible points, fatigue severity at 20.  Her compliance to CPAP is excellent she has used the CPAP every of the last 30 days, with an average of 8 hours and 3 minutes set pressure is 6 cmH2O was 2 cm EPR, but the residual AHI has crept up to 5.5 which is higher than before.  She does have a significant amount of air leakage and apneas are obstructive in nature.  There are some unknown type apneas that I attributed to air leak.  However with the excellent compliance and the low Epworth Sleepiness Scale I think that we do not necessarily have to make changes unless the patient would like to try a different mask or interface. Her machine was issued October 2016.  She has a history of HTN uncontrolled on 4 medications  and was evaluated for various causes. No renal artery stenosis. Now on one medication and doing better.     REVIEW OF SYSTEMS: Out of a complete 14 system review of symptoms, the patient complains only of the following symptoms, and all other reviewed systems are negative.  Epworth sleepiness score 1   ALLERGIES: No Known Allergies  HOME MEDICATIONS: Outpatient Medications Prior to Visit  Medication Sig Dispense Refill   Blood Glucose Monitoring Suppl (ONE TOUCH ULTRA MINI) w/Device KIT 1 each by Does not apply route 3 (three) times daily as needed. 1 kit 1   glucose blood test strip Use as instructed 100 each 12   Lancets (ONETOUCH ULTRASOFT) lancets Use as instructed 100 each 12   losartan (COZAAR) 100 MG tablet TAKE 1 TABLET(100 MG) BY MOUTH DAILY 90 tablet 0   nebivolol (BYSTOLIC) 10 MG tablet Take 1 tablet (10 mg total) by mouth daily. 90 tablet 3   norethindrone (NORLYDA) 0.35 MG tablet TAKE 1 TABLET(0.35 MG) BY MOUTH DAILY 84 tablet 3   OZEMPIC, 0.25 OR 0.5 MG/DOSE, 2 MG/1.5ML SOPN INJECT 0.25 MG INTO THE SKIN ONCE A WEEK 1.5 mL 2   No facility-administered medications prior to visit.    PAST MEDICAL HISTORY: Past Medical History:  Diagnosis Date   Abnormal EKG    Abnormal Pap smear of cervix about 2013, 2016   Colpo biopsy with  normal result per pt.  colpo 2016 HPV changes   Diabetes mellitus without complication ([place])    DUB (dysfunctional uterine bleeding)    around 2012, saw gyn at the time for eval   Elevated hemoglobin A1c    Fibroid    multiple fibroids   Hypertension    Left ovarian cyst 05/2018   do yearly ultrasound.  Doing GYN ONC consulation for monitoring.    Migraine    OSA on CPAP     PAST SURGICAL HISTORY: Past Surgical History:  Procedure Laterality Date   BREAST REDUCTION SURGERY  2005   CHOLECYSTECTOMY  2004   INCISION AND DRAINAGE Bilateral 2015   Bilateral axillary region ebaceous cyst   MASS EXCISION N/A 12/02/2020   Procedure: EXCISION  OF TWO MIDLINE BACK SUBCUTANEOUS MASSES;  Surgeon: Felicie Morn, MD;  Location: [facility];  Service: General;  Laterality: N/A;   REDUCTION MAMMAPLASTY      FAMILY HISTORY: Family History  Problem Relation Age of Onset   CVA Mother 97       deceased, was 40 at the age of her first stroke   Diabetes Father    Deep vein thrombosis Father    Lupus Sister        questionable diagnosis per patient   Breast cancer Maternal Aunt    Breast cancer Maternal Aunt     SOCIAL HISTORY: Social History   Socioeconomic History   Marital status: Married    Spouse name: Not on file   Number of children: 1   Years of education: Not on file   Highest education level: Not on file  Occupational History   Occupation: [employer]  Tobacco Use   Smoking status: Never   Smokeless tobacco: Never  Vaping Use   Vaping Use: Never used  Substance and Sexual Activity   Alcohol use: No    Alcohol/week: 0.0 standard drinks   Drug use: No   Sexual activity: Yes    Birth control/protection: Pill    Comment: Micronor  Other Topics Concern   Not on file  Social History Narrative   Work or School: Education officer, 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[employer]      Home Situation: lives with husband      Spiritual Beliefs: Baptist      Lifestyle: walks every other day; diet is bad      Social Determinants of Radio broadcast assistant Strain: Not on file  Food Insecurity: Not on file  Transportation Needs: Not on file  Physical Activity: Not on file  Stress: Not on file  Social Connections: Not on file  Intimate Partner Violence: Not on file      PHYSICAL EXAM  Vitals:   08/02/21 1453  BP: (!) 172/92  Pulse: 75  Weight: 196 lb (88.9 kg)  Height: _0  (1.626 m)   Body mass index is 33.64 kg/m.  Generalized: Well developed, in no acute distress  Chest: Lungs clear to auscultation bilaterally  Neurological examination  Mentation: Alert  oriented to time, place, history taking. Follows all commands speech and language fluent Cranial nerve II-XII: Extraocular movements were full, visual field were full on confrontational test Head turning and shoulder shrug  were normal and symmetric. Motor: The motor testing reveals 5 over 5 strength of all 4 extremities. Good symmetric motor tone is noted throughout.  Sensory: Sensory testing is intact to soft touch on all 4 extremities. No evidence of extinction is  noted.  Gait and station: Gait is normal.    DIAGNOSTIC DATA (LABS, IMAGING, TESTING) - I reviewed patient records, labs, notes, testing and imaging myself where available.  Lab Results  Component Value Date   WBC 8.2 11/09/2020   HGB 13.4 11/09/2020   HCT 42.4 11/09/2020   MCV 70.5 (L) 11/09/2020   PLT 475 (H) 11/09/2020      Component Value Date/Time   NA 140 11/30/2020 0945   NA 139 11/04/2017 1028   K 3.6 11/30/2020 0945   CL 105 11/30/2020 0945   CO2 24 11/30/2020 0945   GLUCOSE 129 (H) 11/30/2020 0945   BUN 9 11/30/2020 0945   BUN 11 11/04/2017 1028   CREATININE 0.68 11/30/2020 0945   CREATININE 0.70 11/09/2020 1514   CALCIUM 9.1 11/30/2020 0945   PROT 7.0 11/09/2020 1514   ALBUMIN 4.1 04/30/2017 1529   AST 13 11/09/2020 1514   ALT 12 11/09/2020 1514   ALKPHOS 72 04/30/2017 1529   BILITOT 1.2 11/09/2020 1514   GFRNONAA >60 11/30/2020 0945   GFRNONAA >89 04/30/2017 1529   GFRAA >60 02/25/2019 1420   GFRAA >89 04/30/2017 1529   Lab Results  Component Value Date   CHOL 167 04/28/2021   HDL 43 04/28/2021   LDLCALC 94 04/28/2021   TRIG 175 (H) 04/28/2021   CHOLHDL 3.9 04/28/2021   Lab Results  Component Value Date   HGBA1C 7.0 (H) 04/28/2021   Lab Results  Component Value Date   TMBPJPET62 446 04/30/2017   Lab Results  Component Value Date   TSH 2.03 11/09/2020      ASSESSMENT AND PLAN 50 y.o. year old female  has a past medical history of Abnormal EKG, Abnormal Pap smear of cervix  (about 2013, 2016), Diabetes mellitus without complication ([place]), DUB (dysfunctional uterine bleeding), Elevated hemoglobin A1c, Fibroid, Hypertension, Left ovarian cyst (05/2018), Migraine, and OSA on CPAP. here with:  Obstructive sleep apnea on CPAP  CPAP compliance excellent Residual AHI <5 Encourage patient continue using CPAP nightly and greater than 4 hours each night If your symptoms worsen or you develop new symptoms please let us know.  FU 1 year or sooner if needed  Ward Givens, MSN, NP-C 08/02/2021, 3:03 PM [facility] [address] [place], [place] [address] [phone]

## 2021-08-13 ENCOUNTER — Encounter: Payer: Self-pay | Admitting: Family Medicine

## 2021-08-14 ENCOUNTER — Ambulatory Visit: Payer: Managed Care, Other (non HMO) | Admitting: Internal Medicine

## 2021-08-22 ENCOUNTER — Other Ambulatory Visit: Payer: Self-pay

## 2021-08-22 ENCOUNTER — Ambulatory Visit: Payer: Managed Care, Other (non HMO) | Admitting: Nurse Practitioner

## 2021-08-22 ENCOUNTER — Encounter: Payer: Self-pay | Admitting: Nurse Practitioner

## 2021-08-22 VITALS — BP 136/80 | HR 80 | Temp 98.1°F | Ht 65.0 in | Wt 193.0 lb

## 2021-08-22 DIAGNOSIS — M25571 Pain in right ankle and joints of right foot: Secondary | ICD-10-CM

## 2021-08-22 DIAGNOSIS — E119 Type 2 diabetes mellitus without complications: Secondary | ICD-10-CM | POA: Diagnosis not present

## 2021-08-22 DIAGNOSIS — L0292 Furuncle, unspecified: Secondary | ICD-10-CM

## 2021-08-22 DIAGNOSIS — Z7689 Persons encountering health services in other specified circumstances: Secondary | ICD-10-CM

## 2021-08-22 DIAGNOSIS — I10 Essential (primary) hypertension: Secondary | ICD-10-CM

## 2021-08-22 MED ORDER — DICLOFENAC SODIUM 1 % EX GEL: 2.0000 g | Freq: Four times a day (QID) | CUTANEOUS | 2 refills | Status: DC

## 2021-08-22 MED ORDER — CEPHALEXIN 500 MG PO CAPS: 500.0000 mg | ORAL_CAPSULE | Freq: Four times a day (QID) | ORAL | 0 refills | Status: DC

## 2021-08-22 MED ORDER — OZEMPIC (0.25 OR 0.5 MG/DOSE) 2 MG/1.5ML ~~LOC~~ SOPN: PEN_INJECTOR | SUBCUTANEOUS | 1 refills | Status: DC

## 2021-08-22 NOTE — Patient Instructions (Signed)

## 2021-08-22 NOTE — Progress Notes (Signed)
I,Melanie Little,acting as a Education administrator for Melanie Brine, FNP.,have documented all relevant documentation on the behalf of Melanie Brine, FNP,as directed by  Melanie Brine, FNP while in the presence of Melanie Little, [place].   This visit occurred during the SARS-CoV-2 public health emergency.  Safety protocols were in place, including screening questions prior to the visit, additional usage of staff PPE, and extensive cleaning of exam room while observing appropriate contact time as indicated for disinfecting solutions.  Subjective:     Patient ID: Melanie Little , female    DOB: [birth year] , 50 y.o.   MRN: [id number]   Chief Complaint  Patient presents with   Establish Care     HPI  Pt is here to establish care, she was being seen by Dr. Susa Simmonds seen once. She was initially with Dr. Buelah Manis for several years prior to this. Found [place] on google and location was good for her. She works in [employer]. Married. She has one son who is 32 years old. GYN - Dr. Quincy Simmonds and in the process of changing providers.    3 weeks ago she noticed 2 cysts, one between her breast and another under her arm. In the past, she did go to a Psychologist, sport and exercise to get two on her back removed. Pt also complains of pain to the touch dealing with her right foot. She also wants to further discuss her Ozempic. She has been on 0.25 for 4 months, dose had not been gradually increased, dose stayed the same throughout the months.   She has a history of migraines occurring prior to her menstrual cycle. Will not always take medication at the initiation.  She is using a CPAP machine using nigthtly - she seen her neurologist appt a few weeks ago which has improved according to the readings. She had an abnormal EKG - had 2 cyst removed from her back and during her preop. She usually has an elevated blood pressure 170/80s. She has tried other medications by the Cardiologist no longer being followed by Cardiology.   Wt Readings from Last 3  Encounters: 08/22/21 : 193 lb (87.5 kg) 08/02/21 : 196 lb (88.9 kg) 04/28/21 : 192 lb 3.2 oz (87.2 kg)    She has been screened for CA-125 - has been doing for some time has been elevated and may be related to having a sickle cell trait.     Past Medical History:  Diagnosis Date   Abnormal EKG    Abnormal Pap smear of cervix about 2013, 2016   Colpo biopsy with normal result per pt.  colpo 2016 HPV changes   Diabetes mellitus without complication ([place])    DUB (dysfunctional uterine bleeding)    around 2012, saw gyn at the time for eval   Elevated hemoglobin A1c    Fibroid    multiple fibroids   Hypertension    Left ovarian cyst 05/2018   do yearly ultrasound.  Doing GYN ONC consulation for monitoring.    Migraine    OSA on CPAP      Family History  Problem Relation Age of Onset   CVA Mother 32       deceased, was 70 at the age of her first stroke   Diabetes Father    Deep vein thrombosis Father    Breast cancer Maternal Aunt    Breast cancer Maternal Aunt      Current Outpatient Medications:    Blood Glucose Monitoring Suppl (ONE TOUCH ULTRA MINI) w/Device KIT, 1 each by  Does not apply route 3 (three) times daily as needed., Disp: 1 kit, Rfl: 1   diclofenac Sodium (VOLTAREN) 1 % GEL, Apply 2 g topically 4 (four) times daily., Disp: 100 g, Rfl: 2   glucose blood test strip, Use as instructed, Disp: 100 each, Rfl: 12   Lancets (ONETOUCH ULTRASOFT) lancets, Use as instructed, Disp: 100 each, Rfl: 12   losartan (COZAAR) 100 MG tablet, TAKE 1 TABLET(100 MG) BY MOUTH DAILY, Disp: 90 tablet, Rfl: 0   nebivolol (BYSTOLIC) 10 MG tablet, Take 1 tablet (10 mg total) by mouth daily., Disp: 90 tablet, Rfl: 3   norethindrone (NORLYDA) 0.35 MG tablet, TAKE 1 TABLET(0.35 MG) BY MOUTH DAILY, Disp: 84 tablet, Rfl: 3   doxycycline (MONODOX) 100 MG capsule, Take 1 capsule (100 mg total) by mouth 2 (two) times daily., Disp: 20 capsule, Rfl: 0   Semaglutide,0.25 or 0.5MG/DOS, (OZEMPIC, 0.25  OR 0.5 MG/DOSE,) 2 MG/1.5ML SOPN, Take 0.5 mg [place] weekly, Disp: 4.5 mL, Rfl: 1   No Known Allergies   Review of Systems  Constitutional: Negative.   Respiratory: Negative.    Cardiovascular: Negative.   Neurological: Negative.   Psychiatric/Behavioral: Negative.      Today's Vitals   08/22/21 1541  BP: 136/80  Pulse: 80  Temp: 98.1 F (36.7 C)  Weight: 193 lb (87.5 kg)  Height: _0  (1.651 m)  PainSc: 0-No pain   Body mass index is 32.12 kg/m.  Wt Readings from Last 3 Encounters:  08/22/21 193 lb (87.5 kg)  08/02/21 196 lb (88.9 kg)  04/28/21 192 lb 3.2 oz (87.2 kg)    Objective:  Physical Exam Constitutional:      General: She is not in acute distress.    Appearance: Normal appearance. She is obese.  Cardiovascular:     Rate and Rhythm: Normal rate and regular rhythm.     Pulses: Normal pulses.     Heart sounds: Normal heart sounds. No murmur heard. Pulmonary:     Effort: Pulmonary effort is normal. No respiratory distress.     Breath sounds: Normal breath sounds. No wheezing.  Musculoskeletal:        General: No swelling or tenderness. Normal range of motion.     Right lower leg: No edema.     Left lower leg: No edema.  Skin:    General: Skin is warm and dry.     Capillary Refill: Capillary refill takes less than 2 seconds.     Comments: Raised areas between breast and underneath axilla with tenderness  Neurological:     General: No focal deficit present.     Mental Status: She is alert and oriented to person, place, and time.     Cranial Nerves: No cranial nerve deficit.     Motor: No weakness.  Psychiatric:        Mood and Affect: Mood normal.        Behavior: Behavior normal.        Thought Content: Thought content normal.        Judgment: Judgment normal.        Assessment And Plan:     1. Essential hypertension Comments: Blood pressure is fairly controlled. Continue current medications - CMP14+EGFR  2. Boil Comments: These are reoccurring  according to the patient, Will treat with antibiotic.  3. Diabetes mellitus without complication ([place]) Comments: She is to increase her dose of Ozempic to 0.5 mg weekly, will check her HgbA1c Will refer to opthalmology as a baseline  This is a fairly new diagnosis - Semaglutide,0.25 or 0.5MG/DOS, (OZEMPIC, 0.25 OR 0.5 MG/DOSE,) 2 MG/1.5ML SOPN; Take 0.5 mg [place] weekly  Dispense: 4.5 mL; Refill: 1 - CMP14+EGFR - Hemoglobin A1c - POCT Urinalysis Dipstick (67124) - Ambulatory referral to Ophthalmology  4. Acute right ankle pain Comments: Able to walk on ankle, likely arthritis vs inflammation will try diclofenac gel If not better will consider xray - diclofenac Sodium (VOLTAREN) 1 % GEL; Apply 2 g topically 4 (four) times daily.  Dispense: 100 g; Refill: 2  5. Encounter to establish care    Patient was given opportunity to ask questions. Patient verbalized understanding of the plan and was able to repeat key elements of the plan. All questions were answered to their satisfaction.  Melanie Brine, FNP   I, Melanie Brine, FNP, have reviewed all documentation for this visit. The documentation on 08/22/21 for the exam, diagnosis, procedures, and orders are all accurate and complete.   IF YOU HAVE BEEN REFERRED TO A SPECIALIST, IT MAY TAKE 1-2 WEEKS TO SCHEDULE/PROCESS THE REFERRAL. IF YOU HAVE NOT HEARD FROM US/SPECIALIST IN TWO WEEKS, PLEASE GIVE [place] A CALL AT [phone].   THE PATIENT IS ENCOURAGED TO PRACTICE SOCIAL DISTANCING DUE TO THE COVID-19 PANDEMIC.

## 2021-08-23 ENCOUNTER — Telehealth: Payer: Self-pay

## 2021-08-23 LAB — CMP14+EGFR
ALT: 14 IU/L (ref 0–32)
AST: 16 IU/L (ref 0–40)
Albumin/Globulin Ratio: 1.9 (ref 1.2–2.2)
Albumin: 5.1 g/dL — ABNORMAL HIGH (ref 3.8–4.8)
Alkaline Phosphatase: 91 IU/L (ref 44–121)
BUN/Creatinine Ratio: 11 (ref 9–23)
BUN: 8 mg/dL (ref 6–24)
Bilirubin Total: 0.8 mg/dL (ref 0.0–1.2)
CO2: 19 mmol/L — ABNORMAL LOW (ref 20–29)
Calcium: 9.7 mg/dL (ref 8.7–10.2)
Chloride: 102 mmol/L (ref 96–106)
Creatinine, Ser: 0.71 mg/dL (ref 0.57–1.00)
Globulin, Total: 2.7 g/dL (ref 1.5–4.5)
Glucose: 92 mg/dL (ref 65–99)
Potassium: 3.8 mmol/L (ref 3.5–5.2)
Sodium: 142 mmol/L (ref 134–144)
Total Protein: 7.8 g/dL (ref 6.0–8.5)
eGFR: 105 mL/min/{1.73_m2} (ref >59)

## 2021-08-23 LAB — HEMOGLOBIN A1C
Est. average glucose Bld gHb Est-mCnc: 137 mg/dL
Hgb A1c MFr Bld: 6.4 % — ABNORMAL HIGH (ref 4.8–5.6)

## 2021-08-23 NOTE — Telephone Encounter (Signed)
I called patient and left her a vm letting her know her prescription for Voltaren gel is not covered by her insurance so she can just purchase it over the counter. YL,RMA

## 2021-08-28 ENCOUNTER — Encounter: Payer: Self-pay | Admitting: Nurse Practitioner

## 2021-08-28 ENCOUNTER — Other Ambulatory Visit: Payer: Self-pay | Admitting: Nurse Practitioner

## 2021-08-28 DIAGNOSIS — L0292 Furuncle, unspecified: Secondary | ICD-10-CM

## 2021-08-28 MED ORDER — DOXYCYCLINE MONOHYDRATE 100 MG PO CAPS: 100.0000 mg | ORAL_CAPSULE | Freq: Two times a day (BID) | ORAL | 0 refills | Status: DC

## 2021-10-11 ENCOUNTER — Ambulatory Visit: Payer: Managed Care, Other (non HMO) | Admitting: Obstetrics and Gynecology

## 2021-10-17 ENCOUNTER — Ambulatory Visit: Payer: Managed Care, Other (non HMO) | Admitting: Nurse Practitioner

## 2021-10-17 ENCOUNTER — Other Ambulatory Visit: Payer: Self-pay

## 2021-10-17 ENCOUNTER — Encounter: Payer: Self-pay | Admitting: Nurse Practitioner

## 2021-10-17 VITALS — BP 138/90 | HR 70 | Temp 98.1°F | Ht 65.0 in | Wt 191.4 lb

## 2021-10-17 DIAGNOSIS — I10 Essential (primary) hypertension: Secondary | ICD-10-CM

## 2021-10-17 DIAGNOSIS — E1159 Type 2 diabetes mellitus with other circulatory complications: Secondary | ICD-10-CM | POA: Diagnosis not present

## 2021-10-17 DIAGNOSIS — E669 Obesity, unspecified: Secondary | ICD-10-CM

## 2021-10-17 DIAGNOSIS — Z9989 Dependence on other enabling machines and devices: Secondary | ICD-10-CM

## 2021-10-17 DIAGNOSIS — E119 Type 2 diabetes mellitus without complications: Secondary | ICD-10-CM

## 2021-10-17 DIAGNOSIS — Z6831 Body mass index (BMI) 31.0-31.9, adult: Secondary | ICD-10-CM

## 2021-10-17 DIAGNOSIS — G4733 Obstructive sleep apnea (adult) (pediatric): Secondary | ICD-10-CM

## 2021-10-17 DIAGNOSIS — E1169 Type 2 diabetes mellitus with other specified complication: Secondary | ICD-10-CM

## 2021-10-17 MED ORDER — OZEMPIC (1 MG/DOSE) 4 MG/3ML ~~LOC~~ SOPN: 1.0000 mg | PEN_INJECTOR | SUBCUTANEOUS | 1 refills | Status: DC

## 2021-10-17 NOTE — Patient Instructions (Signed)
Diabetes Mellitus and Nutrition, Adult When you have diabetes, or diabetes mellitus, it is very important to have healthy eating habits because your blood sugar (glucose) levels are greatly affected by what you eat and drink. Eating healthy foods in the right amounts, at about the same times every day, can help you:  Control your blood glucose.  Lower your risk of heart disease.  Improve your blood pressure.  Reach or maintain a healthy weight. What can affect my meal plan? Every person with diabetes is different, and each person has different needs for a meal plan. Your health care provider may recommend that you work with a dietitian to make a meal plan that is best for you. Your meal plan may vary depending on factors such as:  The calories you need.  The medicines you take.  Your weight.  Your blood glucose, blood pressure, and cholesterol levels.  Your activity level.  Other health conditions you have, such as heart or kidney disease. How do carbohydrates affect me? Carbohydrates, also called carbs, affect your blood glucose level more than any other type of food. Eating carbs naturally raises the amount of glucose in your blood. Carb counting is a method for keeping track of how many carbs you eat. Counting carbs is important to keep your blood glucose at a healthy level, especially if you use insulin or take certain oral diabetes medicines. It is important to know how many carbs you can safely have in each meal. This is different for every person. Your dietitian can help you calculate how many carbs you should have at each meal and for each snack. How does alcohol affect me? Alcohol can cause a sudden decrease in blood glucose (hypoglycemia), especially if you use insulin or take certain oral diabetes medicines. Hypoglycemia can be a life-threatening condition. Symptoms of hypoglycemia, such as sleepiness, dizziness, and confusion, are similar to symptoms of having too much  alcohol.  Do not drink alcohol if: ? Your health care provider tells you not to drink. ? You are pregnant, may be pregnant, or are planning to become pregnant.  If you drink alcohol: ? Do not drink on an empty stomach. ? Limit how much you use to:  0-1 drink a day for women.  0-2 drinks a day for men. ? Be aware of how much alcohol is in your drink. In the [place] one drink equals one 12 oz bottle of beer (355 mL), one 5 oz glass of wine (148 mL), or one 1 oz glass of hard liquor (44 mL). ? Keep yourself hydrated with water, diet soda, or unsweetened iced tea.  Keep in mind that regular soda, juice, and other mixers may contain a lot of sugar and must be counted as carbs. What are tips for following this plan? Reading food labels  Start by checking the serving size on the "Nutrition Facts" label of packaged foods and drinks. The amount of calories, carbs, fats, and other nutrients listed on the label is based on one serving of the item. Many items contain more than one serving per package.  Check the total grams (g) of carbs in one serving. You can calculate the number of servings of carbs in one serving by dividing the total carbs by 15. For example, if a food has 30 g of total carbs per serving, it would be equal to 2 servings of carbs.  Check the number of grams (g) of saturated fats and trans fats in one serving. Choose foods that have   a low amount or none of these fats.  Check the number of milligrams (mg) of salt (sodium) in one serving. Most people should limit total sodium intake to less than 2,300 mg per day.  Always check the nutrition information of foods labeled as "low-fat" or "nonfat." These foods may be higher in added sugar or refined carbs and should be avoided.  Talk to your dietitian to identify your daily goals for nutrients listed on the label. Shopping  Avoid buying canned, pre-made, or processed foods. These foods tend to be high in fat, sodium, and added  sugar.  Shop around the outside edge of the grocery store. This is where you will most often find fresh fruits and vegetables, bulk grains, fresh meats, and fresh dairy. Cooking  Use low-heat cooking methods, such as baking, instead of high-heat cooking methods like deep frying.  Cook using healthy oils, such as olive, canola, or sunflower oil.  Avoid cooking with butter, cream, or high-fat meats. Meal planning  Eat meals and snacks regularly, preferably at the same times every day. Avoid going long periods of time without eating.  Eat foods that are high in fiber, such as fresh fruits, vegetables, beans, and whole grains. Talk with your dietitian about how many servings of carbs you can eat at each meal.  Eat 4-6 oz (112-168 g) of lean protein each day, such as lean meat, chicken, fish, eggs, or tofu. One ounce (oz) of lean protein is equal to: ? 1 oz (28 g) of meat, chicken, or fish. ? 1 egg. ?  cup (62 g) of tofu.  Eat some foods each day that contain healthy fats, such as avocado, nuts, seeds, and fish.   What foods should I eat? Fruits Berries. Apples. Oranges. Peaches. Apricots. Plums. Grapes. Mango. Papaya. Pomegranate. Kiwi. Cherries. Vegetables Lettuce. Spinach. Leafy greens, including kale, chard, collard greens, and mustard greens. Beets. Cauliflower. Cabbage. Broccoli. Carrots. Green beans. Tomatoes. Peppers. Onions. Cucumbers. Brussels sprouts. Grains Whole grains, such as whole-wheat or whole-grain bread, crackers, tortillas, cereal, and pasta. Unsweetened oatmeal. Quinoa. Brown or wild rice. Meats and other proteins Seafood. Poultry without skin. Lean cuts of poultry and beef. Tofu. Nuts. Seeds. Dairy Low-fat or fat-free dairy products such as milk, yogurt, and cheese. The items listed above may not be a complete list of foods and beverages you can eat. Contact a dietitian for more information. What foods should I avoid? Fruits Fruits canned with  syrup. Vegetables Canned vegetables. Frozen vegetables with butter or cream sauce. Grains Refined [place] flour and flour products such as bread, pasta, snack foods, and cereals. Avoid all processed foods. Meats and other proteins Fatty cuts of meat. Poultry with skin. Breaded or fried meats. Processed meat. Avoid saturated fats. Dairy Full-fat yogurt, cheese, or milk. Beverages Sweetened drinks, such as soda or iced tea. The items listed above may not be a complete list of foods and beverages you should avoid. Contact a dietitian for more information. Questions to ask a health care provider  Do I need to meet with a diabetes educator?  Do I need to meet with a dietitian?  What number can I call if I have questions?  When are the best times to check my blood glucose? Where to find more information:  American Diabetes Association: diabetes.org  Academy of Nutrition and Dietetics: www.eatright.org  National Institute of Diabetes and Digestive and Kidney Diseases: www.niddk.nih.gov  Association of Diabetes Care and Education Specialists: www.diabeteseducator.org Summary  It is important to have healthy eating   habits because your blood sugar (glucose) levels are greatly affected by what you eat and drink.  A healthy meal plan will help you control your blood glucose and maintain a healthy lifestyle.  Your health care provider may recommend that you work with a dietitian to make a meal plan that is best for you.  Keep in mind that carbohydrates (carbs) and alcohol have immediate effects on your blood glucose levels. It is important to count carbs and to use alcohol carefully. This information is not intended to replace advice given to you by your health care provider. Make sure you discuss any questions you have with your health care provider. Document Revised: 11/24/2019 Document Reviewed: 11/24/2019 Elsevier Patient Education  2021 [employer]vier Inc.  

## 2021-10-17 NOTE — Progress Notes (Addendum)
I,Victoria Hamilton,acting as a Education administrator for [name], FNP.,have documented all relevant documentation on the behalf of [name], FNP,as directed by  [name], FNP while in the presence of [name], [place].   This visit occurred during the SARS-CoV-2 public health emergency.  Safety protocols were in place, including screening questions prior to the visit, additional usage of staff PPE, and extensive cleaning of exam room while observing appropriate contact time as indicated for disinfecting solutions.  Subjective:     Patient ID: Melanie Little , female    DOB: Jun 14, 1971 , 50 y.o.   MRN: [id number]   Chief Complaint  Patient presents with   Diabetes     HPI  Pt here for DM & HTN f/u. Pt has an cyst under left arm she complains of a little pain, she was treated with augmentin x 2 by her GYN. She was to a Dermatologist she was referred to did give her cream for the cyst, she is now on bactrim for 30 days and to get a refill. She has had the one between her breast opened up.  She denies headache, SOB, chest pain, dizziness. She has an appointment for her diabetic eye exam in November.   Wt Readings from Last 3 Encounters: 10/17/21 : 191 lb 6.4 oz (86.8 kg) 08/22/21 : 193 lb (87.5 kg) 08/02/21 : 196 lb (88.9 kg)  She is tolerating 0.5 mg well without nausea or change in her diet.     Past Medical History:  Diagnosis Date   Abnormal EKG    Abnormal Pap smear of cervix about 2013, 2016   Colpo biopsy with normal result per pt.  colpo 2016 HPV changes   Diabetes mellitus without complication ([place])    DUB (dysfunctional uterine bleeding)    around 2012, saw gyn at the time for eval   Elevated hemoglobin A1c    Fibroid    multiple fibroids   Hypertension    Left ovarian cyst 05/2018   do yearly ultrasound.  Doing GYN ONC consulation for monitoring.    Migraine    OSA on CPAP      Family History  Problem Relation Age of Onset   CVA Mother 41       deceased, was 11 at  the age of her first stroke   Diabetes Father    Deep vein thrombosis Father    Breast cancer Maternal Aunt    Breast cancer Maternal Aunt      Current Outpatient Medications:    Blood Glucose Monitoring Suppl (ONE TOUCH ULTRA MINI) w/Device KIT, 1 each by Does not apply route 3 (three) times daily as needed., Disp: 1 kit, Rfl: 1   diclofenac Sodium (VOLTAREN) 1 % GEL, Apply 2 g topically 4 (four) times daily., Disp: 100 g, Rfl: 2   glucose blood test strip, Use as instructed, Disp: 100 each, Rfl: 12   Lancets (ONETOUCH ULTRASOFT) lancets, Use as instructed, Disp: 100 each, Rfl: 12   losartan (COZAAR) 100 MG tablet, TAKE 1 TABLET(100 MG) BY MOUTH DAILY, Disp: 90 tablet, Rfl: 0   nebivolol (BYSTOLIC) 10 MG tablet, Take 1 tablet (10 mg total) by mouth daily., Disp: 90 tablet, Rfl: 3   norethindrone (NORLYDA) 0.35 MG tablet, TAKE 1 TABLET(0.35 MG) BY MOUTH DAILY, Disp: 84 tablet, Rfl: 3   clindamycin (CLEOCIN T) 1 % lotion, Apply topically., Disp: , Rfl:    doxycycline (MONODOX) 100 MG capsule, Take 1 capsule (100 mg total) by mouth 2 (two) times  daily. (Patient not taking: Reported on 10/17/2021), Disp: 20 capsule, Rfl: 0   OZEMPIC, 1 MG/DOSE, 4 MG/3ML SOPN, INJECT 1 MG UNDER THE SKIN ONCE A WEEK, Disp: 3 mL, Rfl: 1   sulfamethoxazole-trimethoprim (BACTRIM DS) 800-160 MG tablet, Take 1 tablet by mouth 2 (two) times daily., Disp: , Rfl:    No Known Allergies   Review of Systems  Constitutional: Negative.   Respiratory: Negative.    Cardiovascular: Negative.   Neurological: Negative.   Psychiatric/Behavioral: Negative.      Today's Vitals   10/17/21 1629  BP: 138/90  Pulse: 70  Temp: 98.1 F (36.7 C)  Weight: 191 lb 6.4 oz (86.8 kg)  Height: 5' 5"  (1.651 m)   Body mass index is 31.85 kg/m.  Wt Readings from Last 3 Encounters:  10/17/21 191 lb 6.4 oz (86.8 kg)  08/22/21 193 lb (87.5 kg)  08/02/21 196 lb (88.9 kg)    Objective:  Physical Exam Constitutional:      General:  She is not in acute distress.    Appearance: Normal appearance. She is well-developed. She is obese.  Cardiovascular:     Rate and Rhythm: Normal rate and regular rhythm.     Pulses: Normal pulses.     Heart sounds: Normal heart sounds. No murmur heard. Pulmonary:     Effort: Pulmonary effort is normal.     Breath sounds: Normal breath sounds.  Chest:     Chest wall: No tenderness.  Musculoskeletal:        General: Normal range of motion.  Skin:    General: Skin is warm and dry.     Capillary Refill: Capillary refill takes less than 2 seconds.  Neurological:     General: No focal deficit present.     Mental Status: She is alert and oriented to person, place, and time.     Cranial Nerves: No cranial nerve deficit.     Motor: No weakness.  Psychiatric:        Mood and Affect: Mood normal.        Behavior: Behavior normal.        Thought Content: Thought content normal.        Judgment: Judgment normal.        Assessment And Plan:     1. Type 2 diabetes mellitus with obesity ([place]) Comments: HgbA1c was checked in August and was down to 6.4 from 7 Continue Ozempic, tolerating well  2. Essential hypertension Comments: Blood pressure is slightly elevated, this is her first time at this office. Encouraged to eat a low salt diet    Patient was given opportunity to ask questions. Patient verbalized understanding of the plan and was able to repeat key elements of the plan. All questions were answered to their satisfaction.  [name], FNP   I, [name], FNP, have reviewed all documentation for this visit. The documentation on 11/26/21 for the exam, diagnosis, procedures, and orders are all accurate and complete.   IF YOU HAVE BEEN REFERRED TO A SPECIALIST, IT MAY TAKE 1-2 WEEKS TO SCHEDULE/PROCESS THE REFERRAL. IF YOU HAVE NOT HEARD FROM US/SPECIALIST IN TWO WEEKS, PLEASE GIVE [place] A CALL AT [phone].   THE PATIENT IS ENCOURAGED TO PRACTICE SOCIAL DISTANCING DUE TO  THE COVID-19 PANDEMIC.

## 2021-11-01 ENCOUNTER — Encounter: Payer: Self-pay | Admitting: Nurse Practitioner

## 2021-11-20 ENCOUNTER — Other Ambulatory Visit: Payer: Self-pay | Admitting: Nurse Practitioner

## 2021-11-20 DIAGNOSIS — E119 Type 2 diabetes mellitus without complications: Secondary | ICD-10-CM

## 2021-11-28 ENCOUNTER — Encounter: Payer: Self-pay | Admitting: Nurse Practitioner

## 2021-11-28 LAB — HM DIABETES EYE EXAM

## 2021-12-21 ENCOUNTER — Encounter: Payer: Self-pay | Admitting: Nurse Practitioner

## 2021-12-26 ENCOUNTER — Other Ambulatory Visit: Payer: Self-pay | Admitting: Nurse Practitioner

## 2021-12-31 ENCOUNTER — Other Ambulatory Visit: Payer: Self-pay | Admitting: Nurse Practitioner

## 2022-01-16 ENCOUNTER — Encounter: Payer: Self-pay | Admitting: Nurse Practitioner

## 2022-01-16 ENCOUNTER — Other Ambulatory Visit: Payer: Self-pay

## 2022-01-16 ENCOUNTER — Ambulatory Visit: Payer: Managed Care, Other (non HMO) | Admitting: Nurse Practitioner

## 2022-01-16 VITALS — BP 160/120 | HR 76 | Temp 98.3°F | Ht 65.0 in | Wt 196.6 lb

## 2022-01-16 DIAGNOSIS — E6609 Other obesity due to excess calories: Secondary | ICD-10-CM | POA: Diagnosis not present

## 2022-01-16 DIAGNOSIS — Z6832 Body mass index (BMI) 32.0-32.9, adult: Secondary | ICD-10-CM

## 2022-01-16 DIAGNOSIS — E669 Obesity, unspecified: Secondary | ICD-10-CM

## 2022-01-16 DIAGNOSIS — Z1211 Encounter for screening for malignant neoplasm of colon: Secondary | ICD-10-CM

## 2022-01-16 DIAGNOSIS — Z3041 Encounter for surveillance of contraceptive pills: Secondary | ICD-10-CM

## 2022-01-16 DIAGNOSIS — I10 Essential (primary) hypertension: Secondary | ICD-10-CM | POA: Diagnosis not present

## 2022-01-16 DIAGNOSIS — E1169 Type 2 diabetes mellitus with other specified complication: Secondary | ICD-10-CM | POA: Diagnosis not present

## 2022-01-16 DIAGNOSIS — E119 Type 2 diabetes mellitus without complications: Secondary | ICD-10-CM

## 2022-01-16 MED ORDER — OZEMPIC (1 MG/DOSE) 4 MG/3ML ~~LOC~~ SOPN: PEN_INJECTOR | SUBCUTANEOUS | 1 refills | Status: DC

## 2022-01-16 MED ORDER — OZEMPIC (2 MG/DOSE) 8 MG/3ML ~~LOC~~ SOPN: 2.0000 mg | PEN_INJECTOR | SUBCUTANEOUS | 5 refills | Status: DC

## 2022-01-16 MED ORDER — LOSARTAN POTASSIUM 100 MG PO TABS: ORAL_TABLET | ORAL | 5 refills | Status: DC

## 2022-01-16 MED ORDER — NORETHINDRONE 0.35 MG PO TABS: ORAL_TABLET | ORAL | 3 refills | Status: DC

## 2022-01-16 MED ORDER — NEBIVOLOL HCL 10 MG PO TABS: 10.0000 mg | ORAL_TABLET | Freq: Every day | ORAL | 5 refills | Status: DC

## 2022-01-16 NOTE — Progress Notes (Signed)
I,Melanie Little,acting as a Education administrator for Melanie Brine, FNP.,have documented all relevant documentation on the behalf of Melanie Brine, FNP,as directed by  Melanie Brine, FNP while in the presence of Melanie Little, [place].   This visit occurred during the SARS-CoV-2 public health emergency.  Safety protocols were in place, including screening questions prior to the visit, additional usage of staff PPE, and extensive cleaning of exam room while observing appropriate contact time as indicated for disinfecting solutions.  Subjective:     Patient ID: Melanie Little , female    DOB: May 08, 1971 , 51 y.o.   MRN: [id number]   Chief Complaint  Patient presents with   Hypertension   Diabetes    HPI  Pt here for BP & DM f/u. Continues to having a boil to the middle of her chest has been treated with bactrim and thought she had an allergic reaction but she had also started taking a new diet medication.   Wt Readings from Last 3 Encounters: 01/16/22 : 196 lb 9.6 oz (89.2 kg) 10/17/21 : 191 lb 6.4 oz (86.8 kg) 08/22/21 : 193 lb (87.5 kg)    Hypertension This is a chronic problem. The current episode started more than 1 year ago. The problem is uncontrolled. Pertinent negatives include no chest pain or palpitations.  Diabetes Pertinent negatives for diabetes include no chest pain.    Past Medical History:  Diagnosis Date   Abnormal EKG    Abnormal Pap smear of cervix about 2013, 2016   Colpo biopsy with normal result per pt.  colpo 2016 HPV changes   Diabetes mellitus without complication ([place])    DUB (dysfunctional uterine bleeding)    around 2012, saw gyn at the time for eval   Elevated hemoglobin A1c    Fibroid    multiple fibroids   Hypertension    Left ovarian cyst 05/2018   do yearly ultrasound.  Doing GYN ONC consulation for monitoring.    Migraine    OSA on CPAP      Family History  Problem Relation Age of Onset   CVA Mother 31       deceased, was 42 at the age of her first  stroke   Diabetes Father    Deep vein thrombosis Father    Breast cancer Maternal Aunt    Breast cancer Maternal Aunt      Current Outpatient Medications:    Semaglutide, 2 MG/DOSE, (OZEMPIC, 2 MG/DOSE,) 8 MG/3ML SOPN, Inject 2 mg into the skin once a week., Disp: 3 mL, Rfl: 5   Blood Glucose Monitoring Suppl (ONE TOUCH ULTRA MINI) w/Device KIT, 1 each by Does not apply route 3 (three) times daily as needed., Disp: 1 kit, Rfl: 1   clindamycin (CLEOCIN T) 1 % lotion, Apply topically., Disp: , Rfl:    diclofenac Sodium (VOLTAREN) 1 % GEL, Apply 2 g topically 4 (four) times daily., Disp: 100 g, Rfl: 2   glucose blood test strip, Use as instructed, Disp: 100 each, Rfl: 12   Lancets (ONETOUCH ULTRASOFT) lancets, Use as instructed, Disp: 100 each, Rfl: 12   losartan (COZAAR) 100 MG tablet, Take one tablet by mouth daily, Disp: 30 tablet, Rfl: 5   nebivolol (BYSTOLIC) 10 MG tablet, Take 1 tablet (10 mg total) by mouth daily., Disp: 30 tablet, Rfl: 5   norethindrone (NORLYDA) 0.35 MG tablet, TAKE 1 TABLET(0.35 MG) BY MOUTH DAILY, Disp: 84 tablet, Rfl: 3   No Known Allergies   Review of Systems  Constitutional:  Negative.   Respiratory: Negative.    Cardiovascular: Negative.  Negative for chest pain, palpitations and leg swelling.  Neurological: Negative.   Psychiatric/Behavioral: Negative.      Today's Vitals   01/16/22 1602  BP: (!) 160/120  Pulse: 76  Temp: 98.3 F (36.8 C)  Weight: 196 lb 9.6 oz (89.2 kg)  Height: 5' 5"  (1.651 m)   Body mass index is 32.72 kg/m.  Wt Readings from Last 3 Encounters:  01/16/22 196 lb 9.6 oz (89.2 kg)  10/17/21 191 lb 6.4 oz (86.8 kg)  08/22/21 193 lb (87.5 kg)    BP Readings from Last 3 Encounters:  01/16/22 (!) 160/120  10/17/21 138/90  08/22/21 136/80    Objective:  Physical Exam Vitals reviewed.  Constitutional:      General: She is not in acute distress.    Appearance: Normal appearance. She is well-developed. She is obese.   Cardiovascular:     Rate and Rhythm: Normal rate and regular rhythm.     Pulses: Normal pulses.     Heart sounds: Normal heart sounds. No murmur heard. Pulmonary:     Effort: Pulmonary effort is normal. No respiratory distress.     Breath sounds: Normal breath sounds.  Chest:     Chest wall: No tenderness.  Skin:    General: Skin is warm and dry.     Capillary Refill: Capillary refill takes less than 2 seconds.  Neurological:     General: No focal deficit present.     Mental Status: She is alert and oriented to person, place, and time.     Cranial Nerves: No cranial nerve deficit.     Motor: No weakness.  Psychiatric:        Mood and Affect: Mood normal.        Behavior: Behavior normal.        Thought Content: Thought content normal.        Judgment: Judgment normal.        Assessment And Plan:     1. Uncontrolled hypertension Comments: She informs me she has had a renal ultrasound in the past but I am unable to locate in Epic. will order another one and refer to HTN clinic - nebivolol (BYSTOLIC) 10 MG tablet; Take 1 tablet (10 mg total) by mouth daily.  Dispense: 30 tablet; Refill: 5 - Ambulatory referral to [facility] - [facility] [address] - US Renal; Future - CMP14+EGFR - Lipid panel  2. Type 2 diabetes mellitus with obesity ([place]) Comments: She is tolerating Ozempic well.  - Hemoglobin A1c - Lipid panel - Semaglutide, 2 MG/DOSE, (OZEMPIC, 2 MG/DOSE,) 8 MG/3ML SOPN; Inject 2 mg into the skin once a week.  Dispense: 3 mL; Refill: 5  3. Class 1 obesity due to excess calories with serious comorbidity and body mass index (BMI) of 32.0 to 32.9 in adult She is encouraged to strive for BMI less than 30 to decrease cardiac risk. Advised to aim for at least 150 minutes of exercise per week.   4. Colon cancer screening According to USPTF Colorectal cancer Screening guidelines. Colonoscopy is recommended every 10 years, starting at age 15 years. Will refer  to GI for colon cancer screening. - Cologuard   Patient was given opportunity to ask questions. Patient verbalized understanding of the plan and was able to repeat key elements of the plan. All questions were answered to their satisfaction.  Melanie Brine, FNP   I, Melanie Brine, FNP, have reviewed all documentation  for this visit. The documentation on 01/16/22 for the exam, diagnosis, procedures, and orders are all accurate and complete.   IF YOU HAVE BEEN REFERRED TO A SPECIALIST, IT MAY TAKE 1-2 WEEKS TO SCHEDULE/PROCESS THE REFERRAL. IF YOU HAVE NOT HEARD FROM US/SPECIALIST IN TWO WEEKS, PLEASE GIVE [place] A CALL AT [phone].   THE PATIENT IS ENCOURAGED TO PRACTICE SOCIAL DISTANCING DUE TO THE COVID-19 PANDEMIC.

## 2022-01-16 NOTE — Patient Instructions (Addendum)
Hypertension, Adult Hypertension is another name for high blood pressure. High blood pressure forces your heart to work harder to pump blood. This can cause problems over time. There are two numbers in a blood pressure reading. There is a top number (systolic) over a bottom number (diastolic). It is best to have a blood pressure that is below 120/80. Healthy choices can help lower your blood pressure, or you may need medicine to help lower it. What are the causes? The cause of this condition is not known. Some conditions may be related to high blood pressure. What increases the risk? Smoking. Having type 2 diabetes mellitus, high cholesterol, or both. Not getting enough exercise or physical activity. Being overweight. Having too much fat, sugar, calories, or salt (sodium) in your diet. Drinking too much alcohol. Having long-term (chronic) kidney disease. Having a family history of high blood pressure. Age. Risk increases with age. Race. You may be at higher risk if you are African American. Gender. Men are at higher risk than women before age 29. After age 92, women are at higher risk than men. Having obstructive sleep apnea. Stress. What are the signs or symptoms? High blood pressure may not cause symptoms. Very high blood pressure (hypertensive crisis) may cause: Headache. Feelings of worry or nervousness (anxiety). Shortness of breath. Nosebleed. A feeling of being sick to your stomach (nausea). Throwing up (vomiting). Changes in how you see. Very bad chest pain. Seizures. How is this treated? This condition is treated by making healthy lifestyle changes, such as: Eating healthy foods. Exercising more. Drinking less alcohol. Your health care provider may prescribe medicine if lifestyle changes are not enough to get your blood pressure under control, and if: Your top number is above 130. Your bottom number is above 80. Your personal target blood pressure may vary. Follow  these instructions at home: Eating and drinking  If told, follow the DASH eating plan. To follow this plan: Fill one half of your plate at each meal with fruits and vegetables. Fill one fourth of your plate at each meal with whole grains. Whole grains include whole-wheat pasta, brown rice, and whole-grain bread. Eat or drink low-fat dairy products, such as skim milk or low-fat yogurt. Fill one fourth of your plate at each meal with low-fat (lean) proteins. Low-fat proteins include fish, chicken without skin, eggs, beans, and tofu. Avoid fatty meat, cured and processed meat, or chicken with skin. Avoid pre-made or processed food. Eat less than 1,500 mg of salt each day. Do not drink alcohol if: Your doctor tells you not to drink. You are pregnant, may be pregnant, or are planning to become pregnant. If you drink alcohol: Limit how much you use to: 0-1 drink a day for women. 0-2 drinks a day for men. Be aware of how much alcohol is in your drink. In the [place] one drink equals one 12 oz bottle of beer (355 mL), one 5 oz glass of wine (148 mL), or one 1 oz glass of hard liquor (44 mL). Lifestyle  Work with your doctor to stay at a healthy weight or to lose weight. Ask your doctor what the best weight is for you. Get at least 30 minutes of exercise most days of the week. This may include walking, swimming, or biking. Get at least 30 minutes of exercise that strengthens your muscles (resistance exercise) at least 3 days a week. This may include lifting weights or doing Pilates. Do not use any products that contain nicotine or tobacco, such  as cigarettes, e-cigarettes, and chewing tobacco. If you need help quitting, ask your doctor. Check your blood pressure at home as told by your doctor. Keep all follow-up visits as told by your doctor. This is important. Medicines Take over-the-counter and prescription medicines only as told by your doctor. Follow directions carefully. Do not skip doses of  blood pressure medicine. The medicine does not work as well if you skip doses. Skipping doses also puts you at risk for problems. Ask your doctor about side effects or reactions to medicines that you should watch for. Contact a doctor if you: Think you are having a reaction to the medicine you are taking. Have headaches that keep coming back (recurring). Feel dizzy. Have swelling in your ankles. Have trouble with your vision. Get help right away if you: Get a very bad headache. Start to feel mixed up (confused). Feel weak or numb. Feel faint. Have very bad pain in your: Chest. Belly (abdomen). Throw up more than once. Have trouble breathing. Summary Hypertension is another name for high blood pressure. High blood pressure forces your heart to work harder to pump blood. For most people, a normal blood pressure is less than 120/80. Making healthy choices can help lower blood pressure. If your blood pressure does not get lower with healthy choices, you may need to take medicine. This information is not intended to replace advice given to you by your health care provider. Make sure you discuss any questions you have with your health care provider. Document Revised: 08/27/2018 Document Reviewed: 08/27/2018 Elsevier Patient Education  2022 [employer] [place] [place].  Call about amlodipine (norvasc) to see if you had a reaction I gave you samples of [place] but do not take until I let you know.

## 2022-01-17 ENCOUNTER — Other Ambulatory Visit: Payer: Self-pay | Admitting: Nurse Practitioner

## 2022-01-17 LAB — CMP14+EGFR
ALT: 17 IU/L (ref 0–32)
AST: 12 IU/L (ref 0–40)
Albumin/Globulin Ratio: 2 (ref 1.2–2.2)
Albumin: 4.7 g/dL (ref 3.8–4.8)
Alkaline Phosphatase: 80 IU/L (ref 44–121)
BUN/Creatinine Ratio: 10 (ref 9–23)
BUN: 7 mg/dL (ref 6–24)
Bilirubin Total: 0.9 mg/dL (ref 0.0–1.2)
CO2: 23 mmol/L (ref 20–29)
Calcium: 8.9 mg/dL (ref 8.7–10.2)
Chloride: 105 mmol/L (ref 96–106)
Creatinine, Ser: 0.72 mg/dL (ref 0.57–1.00)
Globulin, Total: 2.4 g/dL (ref 1.5–4.5)
Glucose: 83 mg/dL (ref 70–99)
Potassium: 3.6 mmol/L (ref 3.5–5.2)
Sodium: 141 mmol/L (ref 134–144)
Total Protein: 7.1 g/dL (ref 6.0–8.5)
eGFR: 103 mL/min/{1.73_m2} (ref >59)

## 2022-01-17 LAB — HEMOGLOBIN A1C
Est. average glucose Bld gHb Est-mCnc: 134 mg/dL
Hgb A1c MFr Bld: 6.3 % — ABNORMAL HIGH (ref 4.8–5.6)

## 2022-01-17 LAB — LIPID PANEL
Chol/HDL Ratio: 3.8 ratio (ref 0.0–4.4)
Cholesterol, Total: 174 mg/dL (ref 100–199)
HDL: 46 mg/dL (ref >39)
LDL Chol Calc (NIH): 103 mg/dL — ABNORMAL HIGH (ref 0–99)
Triglycerides: 141 mg/dL (ref 0–149)
VLDL Cholesterol Cal: 25 mg/dL (ref 5–40)

## 2022-01-24 ENCOUNTER — Encounter: Payer: Self-pay | Admitting: Nurse Practitioner

## 2022-01-28 LAB — COLOGUARD: COLOGUARD: NEGATIVE

## 2022-01-31 ENCOUNTER — Encounter: Payer: Self-pay | Admitting: Nurse Practitioner

## 2022-02-02 ENCOUNTER — Other Ambulatory Visit: Payer: Self-pay

## 2022-02-02 MED ORDER — HYDRALAZINE HCL 25 MG PO TABS: 25.0000 mg | ORAL_TABLET | Freq: Three times a day (TID) | ORAL | 2 refills | Status: DC

## 2022-02-13 ENCOUNTER — Ambulatory Visit: Payer: Managed Care, Other (non HMO)

## 2022-02-13 ENCOUNTER — Other Ambulatory Visit: Payer: Self-pay

## 2022-02-13 VITALS — BP 160/92 | HR 77 | Temp 98.0°F | Ht 65.0 in | Wt 196.8 lb

## 2022-02-13 DIAGNOSIS — I10 Essential (primary) hypertension: Secondary | ICD-10-CM

## 2022-02-13 MED ORDER — VALSARTAN-HYDROCHLOROTHIAZIDE 160-12.5 MG PO TABS: 1.0000 | ORAL_TABLET | Freq: Every day | ORAL | 1 refills | Status: DC

## 2022-02-13 NOTE — Progress Notes (Signed)
Patient is here for BPC. She is currently on hydralazine 10m tid losartan 1454daily and bystolic 109WJonce a day.  BP Readings from Last 3 Encounters:  02/13/22 (!) 160/92  01/16/22 (!) 160/120  10/17/21 138/90   Today she will start valsartan-hctz 160-12.5 and discontinue losartan. She will take her valsartan-hctz and hydralazine in the morning she will take the hydralazine in the afternoon and bystolic and hydralazine at night.   She will check her blood pressure twice a day [facility]

## 2022-02-13 NOTE — Patient Instructions (Signed)

## 2022-02-20 ENCOUNTER — Other Ambulatory Visit (INDEPENDENT_AMBULATORY_CARE_PROVIDER_SITE_OTHER): Payer: Managed Care, Other (non HMO)

## 2022-02-20 ENCOUNTER — Other Ambulatory Visit: Payer: Self-pay

## 2022-02-20 ENCOUNTER — Ambulatory Visit: Payer: Managed Care, Other (non HMO)

## 2022-02-20 VITALS — BP 162/100 | HR 78 | Temp 98.1°F | Ht 65.0 in | Wt 196.0 lb

## 2022-02-20 DIAGNOSIS — I10 Essential (primary) hypertension: Secondary | ICD-10-CM

## 2022-02-20 LAB — POCT UA - MICROALBUMIN
Albumin/Creatinine Ratio, Urine, POC: <30
Creatinine, POC: 300 mg/dL
Microalbumin Ur, POC: 30 mg/L

## 2022-02-20 LAB — POCT URINALYSIS DIPSTICK
Bilirubin, UA: NEGATIVE
Blood, UA: NEGATIVE
Glucose, UA: NEGATIVE
Ketones, UA: NEGATIVE
Leukocytes, UA: NEGATIVE
Nitrite, UA: NEGATIVE
Protein, UA: NEGATIVE
Spec Grav, UA: 1.025 (ref 1.010–1.025)
Urobilinogen, UA: 0.2 E.U./dL
pH, UA: 6 (ref 5.0–8.0)

## 2022-02-20 MED ORDER — VALSARTAN-HYDROCHLOROTHIAZIDE 160-12.5 MG PO TABS: ORAL_TABLET | ORAL | 1 refills | Status: DC

## 2022-02-20 NOTE — Progress Notes (Addendum)
The patient was notified by Laurance Flatten, DNP, FNP-BC and the pharmacist Dr. Orlando Penner that her valsartan-hctz medication will be increased. The patient is to take 1 tablet 2 times per day and to return in 1 week for a follow-up.

## 2022-02-27 ENCOUNTER — Ambulatory Visit: Payer: Managed Care, Other (non HMO)

## 2022-02-27 ENCOUNTER — Other Ambulatory Visit: Payer: Self-pay

## 2022-02-27 VITALS — BP 120/84 | HR 85 | Temp 98.0°F | Ht 65.0 in | Wt 195.0 lb

## 2022-02-27 DIAGNOSIS — I10 Essential (primary) hypertension: Secondary | ICD-10-CM

## 2022-02-27 NOTE — Patient Instructions (Signed)

## 2022-02-27 NOTE — Progress Notes (Signed)
Patient is here for BPC.  BP Readings from Last 3 Encounters:  02/27/22 120/84  02/20/22 (!) 162/100  02/13/22 (!) 160/92   She is going to the hypertension clinic on 03/01/22.

## 2022-03-01 ENCOUNTER — Other Ambulatory Visit: Payer: Self-pay

## 2022-03-01 ENCOUNTER — Encounter (HOSPITAL_BASED_OUTPATIENT_CLINIC_OR_DEPARTMENT_OTHER): Payer: Self-pay | Admitting: Cardiovascular Disease

## 2022-03-01 ENCOUNTER — Ambulatory Visit (HOSPITAL_BASED_OUTPATIENT_CLINIC_OR_DEPARTMENT_OTHER): Payer: Managed Care, Other (non HMO) | Admitting: Cardiovascular Disease

## 2022-03-01 DIAGNOSIS — I10 Essential (primary) hypertension: Secondary | ICD-10-CM

## 2022-03-01 DIAGNOSIS — G4733 Obstructive sleep apnea (adult) (pediatric): Secondary | ICD-10-CM

## 2022-03-01 DIAGNOSIS — Z006 Encounter for examination for normal comparison and control in clinical research program: Secondary | ICD-10-CM

## 2022-03-01 MED ORDER — AMLODIPINE BESYLATE 5 MG PO TABS: 5.0000 mg | ORAL_TABLET | Freq: Every day | ORAL | 3 refills | Status: DC

## 2022-03-01 NOTE — Assessment & Plan Note (Signed)
Continue CPAP.  

## 2022-03-01 NOTE — Research (Signed)
?  Subject Name: Melanie Little ? ?Melanie Little met inclusion and exclusion criteria for the Virtual Care and Social Determinant Interventions for the management of hypertension trial.  The informed consent form, study requirements and expectations were reviewed with the subject by Dr. Oval Linsey and myself. The subject was given the opportunity to read the consent and ask questions. The subject verbalized understanding of the trial requirements.  All questions were addressed prior to the signing of the consent form. The subject agreed to participate in the trial and signed the informed consent. The informed consent was obtained prior to performance of any protocol-specific procedures for the subject.  A copy of the signed informed consent was given to the subject and a copy was placed in the subject's medical record.  ?Melanie Little was randomized to Group 1.  ? ?

## 2022-03-01 NOTE — Progress Notes (Signed)
Advanced [facility]pertension [facility] Initial Assessment:    Date:  03/01/2022   ID:  Melanie Little, DOB 1971-08-01, MRN [id number]  PCP:  [name], FNP  Cardiologist:  None  Nephrologist:  Referring MD: [name], FNP   CC: Hypertension  History of Present Illness:    Melanie Little is a 51 y.o. female with a hx of hypertension, diabetes, and OSA on CPAP, here to establish care in the [facility] Clinic. She saw [name], NP on 12/2020 and her BP was 160/120. She previously had renal artery dopplers 10/2017 which were normal. She had a CT of the Abdomen/Pelvis 01/2019 which showed no abnormal findings. Valsartan/HCTZ was increased 02/20/22.  Today, she appears well. She reports having hypertension since her 69's, with a random onset. Within the past 3 years it has become more difficult to control. She notes trying various medicinal cocktails with little success. Her body adapted to her high readings of 180s-200s, so she suffers from less frequent headaches and other symptoms. Her previous stress test was stopped due to her hypertension. More recently, her BP log for 12/2021 shows continued labile readings ranging from the 140s-200s. In 01/2022 she was started on hydralazine. Then the Valsartan/HCTZ was added, and losartan was stopped. As of last week valsartan was increased to twice a day. She notes some improvement mostly in her diastolic blood pressures. In a typical day she is mostly working at a desk. She may walk 6-7 blocks from her work to the gym. She does not formally exercise very often. If on the treadmill or walking briskly, her heart rate increases and she develops an "achy feeling" in her chest. This feeling has been ongoing for several years and is only with exertion. She reports heavy breathing with exertion and going up stairs as well. Concerning her diet she cooks at home and orders out equally. When cooking she only uses seasoning salt, and typically does not add extra  salt otherwise. May have a glass of wine rarely with social occasions. Previously she was using a GoLean gummy supplement, this was stopped. She plans to start Vitamin B for fatigue. Typically she has a severe migraine headache once a month, and will take 600 mg Ibuprofen. At night she always uses her CPAP. She denies any palpitations, or peripheral edema. No lightheadedness, syncope, orthopnea, or PND.  Previous antihypertensives: Amlodipine - Ankle/hand edema Labetalol Carvedilol spironolactone   Past Medical History:  Diagnosis Date   Abnormal EKG    Abnormal Pap smear of cervix about 2013, 2016   Colpo biopsy with normal result per pt.  colpo 2016 HPV changes   Diabetes mellitus without complication ([place])    DUB (dysfunctional uterine bleeding)    around 2012, saw gyn at the time for eval   Elevated hemoglobin A1c    Fibroid    multiple fibroids   Hypertension    Left ovarian cyst 05/2018   do yearly ultrasound.  Doing GYN ONC consulation for monitoring.    Migraine    OSA on CPAP    Resistant hypertension 05/02/2021    Past Surgical History:  Procedure Laterality Date   BREAST REDUCTION SURGERY  2005   CHOLECYSTECTOMY  2004   INCISION AND DRAINAGE Bilateral 2015   Bilateral axillary region ebaceous cyst   MASS EXCISION N/A 12/02/2020   Procedure: EXCISION OF TWO MIDLINE BACK SUBCUTANEOUS MASSES;  Surgeon: Felicie Morn, MD;  Location: [place];  Service: General;  Laterality: N/A;   REDUCTION MAMMAPLASTY  Current Medications: Current Meds  Medication Sig   Blood Glucose Monitoring Suppl (ONE TOUCH ULTRA MINI) w/Device KIT 1 each by Does not apply route 3 (three) times daily as needed.   clindamycin (CLEOCIN T) 1 % lotion Apply topically.   EPINEPHrine 0.3 mg/0.3 mL IJ SOAJ injection Inject 0.3 mLs into the muscle as needed. For Anaphylaxis   glucose blood test strip Use as instructed   hydrALAZINE (APRESOLINE) 25 MG tablet Take 1 tablet (25  mg total) by mouth 3 (three) times daily.   Lancets (ONETOUCH ULTRASOFT) lancets Use as instructed   nebivolol (BYSTOLIC) 10 MG tablet Take 1 tablet (10 mg total) by mouth daily.   norethindrone (NORLYDA) 0.35 MG tablet TAKE 1 TABLET(0.35 MG) BY MOUTH DAILY   Semaglutide, 2 MG/DOSE, (OZEMPIC, 2 MG/DOSE,) 8 MG/3ML SOPN Inject 2 mg into the skin once a week.   valsartan-hydrochlorothiazide (DIOVAN-HCT) 160-12.5 MG tablet Take 1 tablet by mouth 2 times per day     Allergies:   Patient has no active allergies.   Social History   Socioeconomic History   Marital status: Married    Spouse name: Not on file   Number of children: 1   Years of education: Not on file   Highest education level: Not on file  Occupational History   Occupation: [employer]  Tobacco Use   Smoking status: Never   Smokeless tobacco: Never  Vaping Use   Vaping Use: Never used  Substance and Sexual Activity   Alcohol use: No    Alcohol/week: 0.0 standard drinks   Drug use: No   Sexual activity: Yes    Birth control/protection: Pill    Comment: Micronor  Other Topics Concern   Not on file  Social History Narrative   Work or School: Education officer, 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[employer]      Home Situation: lives with husband      Spiritual Beliefs: Baptist      Lifestyle: walks every other day; diet is bad      Social Determinants of Radio broadcast assistant Strain: Low Risk    Difficulty of Paying Living Expenses: Not hard at all  Food Insecurity: No Food Insecurity   Worried About Charity fundraiser in the Last Year: Never true   Arboriculturist in the Last Year: Never true  Transportation Needs: No Transportation Needs   Lack of Transportation (Medical): No   Lack of Transportation (Non-Medical): No  Physical Activity: Inactive   Days of Exercise per Week: 0 days   Minutes of Exercise per Session: 0 min  Stress: Not on file  Social Connections: Not on file      Family History: The patient's family history includes Breast cancer in her maternal aunt and maternal aunt; CVA (age of onset: 26) in her mother; Deep vein thrombosis in her father; Diabetes in her father.  ROS:   Please see the history of present illness.    (+) Aching pain in chest (+) Shortness of breath (+) Headaches (+) Fatigue All other systems reviewed and are negative.  EKGs/Labs/Other Studies Reviewed:    Lexiscan Myoview 01/31/2021: Nuclear stress EF: 50%. Blood pressure demonstrated a hypertensive response to exercise. ST segment elevation was noted during stress in the aVR leads. The study is normal. This is a low risk study. The left ventricular ejection fraction is mildly decreased (45-54%).   Very elevated blood pressure, initially 207/110 and then 235/117 after lexiscan. Concerning ECG  changes with aVR elevation and horizontal ST depressions as noted. However, normal perfusion without evidence of ischemia. The EF calculates as low normal but visually appears normal.  Echo 01/31/2021:  1. Left ventricular ejection fraction, by estimation, is 60 to 65%. The  left ventricle has normal function. The left ventricle has no regional  wall motion abnormalities. There is moderate left ventricular hypertrophy.  Left ventricular diastolic  parameters were normal. The average left ventricular global longitudinal  strain is -18.9 %. The global longitudinal strain is normal.   2. Right ventricular systolic function is normal. The right ventricular  size is normal. There is normal pulmonary artery systolic pressure. The  estimated right ventricular systolic pressure is 96.2 mmHg.   3. The mitral valve is normal in structure. No evidence of mitral valve  regurgitation.   4. The aortic valve is tricuspid. Aortic valve regurgitation is not  visualized.   5. Aortic dilatation noted. There is borderline dilatation of the  ascending aorta, measuring 38 mm.   6. The inferior vena  cava is normal in size with <50% respiratory  variability, suggesting right atrial pressure of 8 mmHg.   Comparison(s): Prior images unable to be directly viewed, comparison made by report only. Changes from prior study are noted. 11/04/2017: LVEF 60% with moderate to severe LVH.   CT Abdomen/Pelvis 02/26/2019: FINDINGS: Lower Chest: No acute findings.   Hepatobiliary: No hepatic masses identified. Mild diffuse hepatic steatosis noted. Prior cholecystectomy. No evidence of biliary obstruction.   Pancreas:  No mass or inflammatory changes.   Spleen: Within normal limits in size and appearance.   Adrenals/Urinary Tract: No masses identified. No evidence of hydronephrosis.   Stomach/Bowel: No evidence of obstruction, inflammatory process or abnormal fluid collections. Normal appendix visualized.   Vascular/Lymphatic: No pathologically enlarged lymph nodes. No abdominal aortic aneurysm.   Reproductive: Multiple uterine fibroids are seen, largest measuring approximately 3.5 cm. A cystic lesion with a few thin septations and somewhat tubular appearance is seen in the left adnexa which measures 5.0 x 3.1 x 4.2 cm. This is similar in size in appearance compared to previous ultrasound. Differential diagnosis includes cystic ovarian neoplasm and hydrosalpinx. No definite features of malignancy identified. No evidence of ascites.   Other:  None.   Musculoskeletal:  No suspicious bone lesions identified.   IMPRESSION: 1. 5 cm cystic lesion in left adnexa with thin septations and somewhat tubular appearance. Differential diagnosis includes cystic ovarian neoplasm and hydrosalpinx. Consider pelvic MRI without and with contrast for further characterization. 2. Multiple uterine fibroids, largest measuring 3.5 cm. 3. Mild hepatic steatosis.  Bilateral Renal Artery Dopplers 11/15/2017: FINAL INTERPRETATION     Largest Aortic Diameter     1.4 cm     Renal  No evidence of renal artery  stenosis, bilaterally.     Tortuous renal arteries, bilaterally.  Normal and symmetrical kidney size.  Normal cortical thickness.  No evidence of SMA or celiac artery stenosis.  Patent renal veins and IVC.   EKG:  EKG is personally reviewed. 03/01/2022: EKG was not ordered.   Recent Labs: 01/16/2022: ALT 17; BUN 7; Creatinine, Ser 0.72; Potassium 3.6; Sodium 141   Recent Lipid Panel    Component Value Date/Time   CHOL 174 01/16/2022 1702   TRIG 141 01/16/2022 1702   HDL 46 01/16/2022 1702   CHOLHDL 3.8 01/16/2022 1702   CHOLHDL 3.8 01/29/2020 0821   VLDL 30.4 12/03/2016 1233   LDLCALC 103 (H) 01/16/2022 1702   LDLCALC 105 (H)  01/29/2020 0938    Physical Exam:    VS:  BP (!) 152/104 (BP Location: Left Arm, Patient Position: Sitting, Cuff Size: Large)    Pulse 82    Ht _0  (1.651 m)    Wt 198 lb 12.8 oz (90.2 kg)    SpO2 99%    BMI 33.08 kg/m  , BMI Body mass index is 33.08 kg/m. GENERAL:  Well appearing HEENT: Pupils equal round and reactive, fundi not visualized, oral mucosa unremarkable NECK:  No jugular venous distention, waveform within normal limits, carotid upstroke brisk and symmetric, no bruits, no thyromegaly LUNGS:  Clear to auscultation bilaterally HEART:  RRR.  PMI not displaced or sustained,S1 and S2 within normal limits, no S3, no S4, no clicks, no rubs, no murmurs ABD:  Flat, positive bowel sounds normal in frequency in pitch, no bruits, no rebound, no guarding, no midline pulsatile mass, no hepatomegaly, no splenomegaly EXT:  2 plus pulses throughout, no edema, no cyanosis no clubbing SKIN:  No rashes no nodules NEURO:  Cranial nerves II through XII grossly intact, motor grossly intact throughout PSYCH:  Cognitively intact, oriented to person place and time   ASSESSMENT/PLAN:    Resistant hypertension Blood pressure remains poorly controlled.  She has struggled with multiple medication trials and has not seen significant, consistent improvement in her  blood pressure.  Her OSA is treated.  CT scan 01/2019 was negative for adenomas or renal artery stenosis.  She also had a negative renal artery Dopplers in 2018.  Thyroid function has been normal.  We did discuss the importance of reducing her sodium intake and increasing her exercise to at least 150 minutes weekly.  We will refer her to the PR EP program for [facility]  She previously had swelling on amlodipine.  However she was not on a diuretic at the time.  We will add amlodipine 5 mg daily.  Continue valsartan/HCTZ, nebivolol, and hydralazine for now.  We will plan to transition to Tribenzor if she tolerates amlodipine.  She has previously been on spironolactone but could consider resuming this and stopping the hydralazine in the future as well.  OSA (obstructive sleep apnea) Continue CPAP.   Screening for Secondary Hypertension:  Causes 03/01/2022  Drugs/Herbals Screened     - Comments High sodium, rare EtOH, no caffeine, rare NSAIDs  Renovascular HTN Screened     - Comments Renal Doppler negative 2018, CT-A abd negative 2020  Sleep Apnea Screened     - Comments uses CPAP faithfully  Thyroid Disease Screened  Hyperaldosteronism Screened  Pheochromocytoma N/A  Cushing's Syndrome N/A  Hyperparathyroidism Screened  Coarctation of the Aorta Screened     - Comments BP symmetric  Compliance Screened    Relevant Labs/Studies: Basic Labs Latest Ref Rng & Units 01/16/2022 08/22/2021 11/30/2020  Sodium 134 - 144 mmol/L 141 142 140  Potassium 3.5 - 5.2 mmol/L 3.6 3.8 3.6  Creatinine 0.57 - 1.00 mg/dL 0.72 0.71 0.68    Thyroid  Latest Ref Rng & Units 11/09/2020 05/14/2018  TSH mIU/L 2.03 2.80             Renovascular  11/15/2017  Renal Artery [place] Completed Yes     Disposition:    FU with APP/PharmD in 1 month for the next 3 months.   FU with Janese Radabaugh C. Oval Linsey, MD, [facility] in 4 months.   Medication Adjustments/Labs and Tests Ordered: Current medicines are reviewed at length with the patient  today.  Concerns regarding medicines are outlined above.  No orders of the defined types were placed in this encounter.  No orders of the defined types were placed in this encounter.  I,Mathew Stumpf,acting as a Education administrator for Skeet Latch, MD.,have documented all relevant documentation on the behalf of Skeet Latch, MD,as directed by  Skeet Latch, MD while in the presence of Skeet Latch, MD.  I, [place] Oval Linsey, MD have reviewed all documentation for this visit.  The documentation of the exam, diagnosis, procedures, and orders on 03/01/2022 are all accurate and complete.   Signed, Skeet Latch, MD  03/01/2022 9:34 AM    [place]

## 2022-03-01 NOTE — Patient Instructions (Addendum)
Medication Instructions:  START AMLODIPINE 5 MG DAILY    Labwork: NONE   Testing/Procedures: NONE   Follow-Up: 04/04/2022 8 AM WITH PHARM D    You will receive a phone call from the PREP exercise and nutrition program to schedule an initial assessment.  Special Instructions:   MONITOR YOUR BLOOD PRESSURE TWICE A DAY WITH MACHINE PROVED, LOG IN BOOK GIVEN AND BRING TO FOLLOW UP   DASH Eating Plan DASH stands for "Dietary Approaches to Stop Hypertension." The DASH eating plan is a healthy eating plan that has been shown to reduce high blood pressure (hypertension). It may also reduce your risk for type 2 diabetes, heart disease, and stroke. The DASH eating plan may also help with weight loss. What are tips for following this plan?  General guidelines Avoid eating more than 2,300 mg (milligrams) of salt (sodium) a day. If you have hypertension, you may need to reduce your sodium intake to 1,500 mg a day. Limit alcohol intake to no more than 1 drink a day for nonpregnant women and 2 drinks a day for men. One drink equals 12 oz of beer, 5 oz of wine, or 1 oz of hard liquor. Work with your health care provider to maintain a healthy body weight or to lose weight. Ask what an ideal weight is for you. Get at least 30 minutes of exercise that causes your heart to beat faster (aerobic exercise) most days of the week. Activities may include walking, swimming, or biking. Work with your health care provider or diet and nutrition specialist (dietitian) to adjust your eating plan to your individual calorie needs. Reading food labels  Check food labels for the amount of sodium per serving. Choose foods with less than 5 percent of the Daily Value of sodium. Generally, foods with less than 300 mg of sodium per serving fit into this eating plan. To find whole grains, look for the word "whole" as the first word in the ingredient list. Shopping Buy products labeled as "low-sodium" or "no salt added." Buy  fresh foods. Avoid canned foods and premade or frozen meals. Cooking Avoid adding salt when cooking. Use salt-free seasonings or herbs instead of table salt or sea salt. Check with your health care provider or pharmacist before using salt substitutes. Do not fry foods. Cook foods using healthy methods such as baking, boiling, grilling, and broiling instead. Cook with heart-healthy oils, such as olive, canola, soybean, or sunflower oil. Meal planning Eat a balanced diet that includes: 5 or more servings of fruits and vegetables each day. At each meal, try to fill half of your plate with fruits and vegetables. Up to 6-8 servings of whole grains each day. Less than 6 oz of lean meat, poultry, or fish each day. A 3-oz serving of meat is about the same size as a deck of cards. One egg equals 1 oz. 2 servings of low-fat dairy each day. A serving of nuts, seeds, or beans 5 times each week. Heart-healthy fats. Healthy fats called Omega-3 fatty acids are found in foods such as flaxseeds and coldwater fish, like sardines, salmon, and mackerel. Limit how much you eat of the following: Canned or prepackaged foods. Food that is high in trans fat, such as fried foods. Food that is high in saturated fat, such as fatty meat. Sweets, desserts, sugary drinks, and other foods with added sugar. Full-fat dairy products. Do not salt foods before eating. Try to eat at least 2 vegetarian meals each week. Eat more home-cooked food  and less restaurant, buffet, and fast food. When eating at a restaurant, ask that your food be prepared with less salt or no salt, if possible. What foods are recommended? The items listed may not be a complete list. Talk with your dietitian about what dietary choices are best for you. Grains Whole-grain or whole-wheat bread. Whole-grain or whole-wheat pasta. Brown rice. [place]. Bulgur. Whole-grain and low-sodium cereals. Pita bread. Low-fat, low-sodium crackers. Whole-wheat  flour tortillas. Vegetables Fresh or frozen vegetables (raw, steamed, roasted, or grilled). Low-sodium or reduced-sodium tomato and vegetable juice. Low-sodium or reduced-sodium tomato sauce and tomato paste. Low-sodium or reduced-sodium canned vegetables. Fruits All fresh, dried, or frozen fruit. Canned fruit in natural juice (without added sugar). Meat and other protein foods Skinless chicken or [place]. Ground chicken or [place]. Pork with fat trimmed off. Fish and seafood. Egg whites. Dried beans, peas, or lentils. Unsalted nuts, nut butters, and seeds. Unsalted canned beans. Lean cuts of beef with fat trimmed off. Low-sodium, lean deli meat. Dairy Low-fat (1%) or fat-free (skim) milk. Fat-free, low-fat, or reduced-fat cheeses. Nonfat, low-sodium ricotta or cottage cheese. Low-fat or nonfat yogurt. Low-fat, low-sodium cheese. Fats and oils Soft margarine without trans fats. Vegetable oil. Low-fat, reduced-fat, or light mayonnaise and salad dressings (reduced-sodium). Canola, safflower, olive, soybean, and sunflower oils. Avocado. Seasoning and other foods Herbs. Spices. Seasoning mixes without salt. Unsalted popcorn and pretzels. Fat-free sweets. What foods are not recommended? The items listed may not be a complete list. Talk with your dietitian about what dietary choices are best for you. Grains Baked goods made with fat, such as croissants, muffins, or some breads. Dry pasta or rice meal packs. Vegetables Creamed or fried vegetables. Vegetables in a cheese sauce. Regular canned vegetables (not low-sodium or reduced-sodium). Regular canned tomato sauce and paste (not low-sodium or reduced-sodium). Regular tomato and vegetable juice (not low-sodium or reduced-sodium). Angie Fava. Olives. Fruits Canned fruit in a light or heavy syrup. Fried fruit. Fruit in cream or butter sauce. Meat and other protein foods Fatty cuts of meat. Ribs. Fried meat. Berniece Salines. Sausage. Bologna and other processed lunch  meats. Salami. Fatback. Hotdogs. Bratwurst. Salted nuts and seeds. Canned beans with added salt. Canned or smoked fish. Whole eggs or egg yolks. Chicken or [place] with skin. Dairy Whole or 2% milk, cream, and half-and-half. Whole or full-fat cream cheese. Whole-fat or sweetened yogurt. Full-fat cheese. Nondairy creamers. Whipped toppings. Processed cheese and cheese spreads. Fats and oils Butter. Stick margarine. Lard. Shortening. Ghee. Bacon fat. Tropical oils, such as coconut, palm kernel, or palm oil. Seasoning and other foods Salted popcorn and pretzels. Onion salt, garlic salt, seasoned salt, table salt, and sea salt. Worcestershire sauce. Tartar sauce. Barbecue sauce. Teriyaki sauce. Soy sauce, including reduced-sodium. Steak sauce. Canned and packaged gravies. Fish sauce. Oyster sauce. Cocktail sauce. Horseradish that you find on the shelf. Ketchup. Mustard. Meat flavorings and tenderizers. Bouillon cubes. Hot sauce and Tabasco sauce. Premade or packaged marinades. Premade or packaged taco seasonings. Relishes. Regular salad dressings. Where to find more information: National Heart, Lung, and [place]: https://wilson-eaton.com/ American Heart Association: www.heart.org Summary The DASH eating plan is a healthy eating plan that has been shown to reduce high blood pressure (hypertension). It may also reduce your risk for type 2 diabetes, heart disease, and stroke. With the DASH eating plan, you should limit salt (sodium) intake to 2,300 mg a day. If you have hypertension, you may need to reduce your sodium intake to 1,500 mg a day. When on the DASH  eating plan, aim to eat more fresh fruits and vegetables, whole grains, lean proteins, low-fat dairy, and heart-healthy fats. Work with your health care provider or diet and nutrition specialist (dietitian) to adjust your eating plan to your individual calorie needs. This information is not intended to replace advice given to you by your health care  provider. Make sure you discuss any questions you have with your health care provider. Document Released: 12/06/2011 Document Revised: 11/29/2017 Document Reviewed: 12/10/2016 Elsevier Patient Education  2020 [employer].

## 2022-03-01 NOTE — Assessment & Plan Note (Addendum)
Blood pressure remains poorly controlled.  She has struggled with multiple medication trials and has not seen significant, consistent improvement in her blood pressure.  Her OSA is treated.  CT scan 01/2019 was negative for adenomas or renal artery stenosis.  She also had a negative renal artery Dopplers in 2018.  Thyroid function has been normal.  We did discuss the importance of reducing her sodium intake and increasing her exercise to at least 150 minutes weekly.  We will refer her to the PR EP program for [facility].  She previously had swelling on amlodipine.  However she was not on a diuretic at the time.  We will add amlodipine 5 mg daily.  Continue valsartan/HCTZ, nebivolol, and hydralazine for now.  We will plan to transition to Tribenzor if she tolerates amlodipine.  She has previously been on spironolactone but could consider resuming this and stopping the hydralazine in the future as well.  She consents to enroll in our remote patient monitoring study and consents to using the [employer] remote patient monitoring system. ?

## 2022-03-12 ENCOUNTER — Other Ambulatory Visit: Payer: Self-pay | Admitting: Nurse Practitioner

## 2022-03-12 DIAGNOSIS — Z1231 Encounter for screening mammogram for malignant neoplasm of breast: Secondary | ICD-10-CM

## 2022-04-04 ENCOUNTER — Encounter (HOSPITAL_BASED_OUTPATIENT_CLINIC_OR_DEPARTMENT_OTHER): Payer: Self-pay | Admitting: Pharmacist Clinician (PhC)/ Clinical Pharmacy Specialist

## 2022-04-04 ENCOUNTER — Ambulatory Visit (HOSPITAL_BASED_OUTPATIENT_CLINIC_OR_DEPARTMENT_OTHER): Payer: Managed Care, Other (non HMO) | Admitting: Pharmacist Clinician (PhC)/ Clinical Pharmacy Specialist

## 2022-04-04 VITALS — BP 134/77 | HR 71 | Ht 64.0 in | Wt 194.0 lb

## 2022-04-04 DIAGNOSIS — I10 Essential (primary) hypertension: Secondary | ICD-10-CM | POA: Diagnosis not present

## 2022-04-04 LAB — BASIC METABOLIC PANEL
BUN/Creatinine Ratio: 14 (ref 9–23)
BUN: 14 mg/dL (ref 6–24)
CO2: 24 mmol/L (ref 20–29)
Calcium: 9.6 mg/dL (ref 8.7–10.2)
Chloride: 98 mmol/L (ref 96–106)
Creatinine, Ser: 1.02 mg/dL — ABNORMAL HIGH (ref 0.57–1.00)
Glucose: 108 mg/dL — ABNORMAL HIGH (ref 70–99)
Potassium: 3.4 mmol/L — ABNORMAL LOW (ref 3.5–5.2)
Sodium: 140 mmol/L (ref 134–144)
eGFR: 68 mL/min/{1.73_m2} (ref >59)

## 2022-04-04 MED ORDER — HYDRALAZINE HCL 50 MG PO TABS: 50.0000 mg | ORAL_TABLET | Freq: Three times a day (TID) | ORAL | 6 refills | Status: DC

## 2022-04-04 NOTE — Assessment & Plan Note (Signed)
Patient with resistant hypertension, still not at goal, but improving.  Will have her increase hydralazine to 50 mg tid and continue with all other medications.  She has not had BMET since starting valsartan/hydrochlorothizade combination, so will have that drawn today.  She will continue with home monitoring and return in 1 month for follow up.   ?

## 2022-04-04 NOTE — Patient Instructions (Signed)
Return for a a follow up appointment May 3 at 8:30 am ? ?Go to the lab today to check kidney function ? ?Check your blood pressure at home daily and keep record of the readings. ? ?Take your BP meds as follows: ? Increase hydralazine to 50 mg three times daily (take 2 of the 25 mg tabs three times daily until gone) ? Continue all other medications ? ?Bring all of your meds, your BP cuff and your record of home blood pressures to your next appointment.  Exercise as you?re able, try to walk approximately 30 minutes per day.  Keep salt intake to a minimum, especially watch canned and prepared boxed foods.  Eat more fresh fruits and vegetables and fewer canned items.  Avoid eating in fast food restaurants.  ? ? HOW TO TAKE YOUR BLOOD PRESSURE: ?Rest 5 minutes before taking your blood pressure. ? Don?t smoke or drink caffeinated beverages for at least 30 minutes before. ?Take your blood pressure before (not after) you eat. ?Sit comfortably with your back supported and both feet on the floor (don?t cross your legs). ?Elevate your arm to heart level on a table or a desk. ?Use the proper sized cuff. It should fit smoothly and snugly around your bare upper arm. There should be enough room to slip a fingertip under the cuff. The bottom edge of the cuff should be 1 inch above the crease of the elbow. ?Ideally, take 3 measurements at one sitting and record the average. ? ? ?

## 2022-04-04 NOTE — Progress Notes (Signed)
? ? ? ?04/04/2022 ?Mozell Mcclatchy ?1971/05/22 ?[id number] ? ? ?HPI:  Melanie Little is a 51 y.o. female patient of Dr Oval Linsey, with a [place] below who presents today for advanced hypertension clinic follow up.  Patient was seen by Dr. Oval Linsey last month, at which time her blood pressure was 152/104.  She was agreeable to our remote patient monitoring study and randomized to group 1.  Patient reported developing hypertension while in her 12's, but just in the past few years has been hard to control.   ? ?Today she is in the office for follow up.  Has no complaints or side effects from her current regimen.  No signs of edema since re-starting amlodipine.  She is concerned about taking too many medications and notes that the pharmacist at her PCP office suggested to her that nebivolol was not very good at lowering BP. ? ?Past Medical History: ?DM2 A1c 6.3 on Ozempic 2 mg weekly  ?OSA Compliant with CPAP  ?migraine Occurs about once a month, treats with ibuprofen  ?  ?Blood Pressure Goal:  130/80 ? ?Current Medications: amlodipine 5 mg qd - am, valsartan hct 160/12.5 mg bid, nebivolol 10 mg qd - pm, hydralazine 25 mg tid ? ?Family Hx:  mother with history of stroke, first in her 74's, had 15 in 59 years, now deceased - cause of strokes was never discovered; father with hypertension, DM, living; no full siblings; 1 son now 27 healthy ? ?Social Hx: no tobacco, no alcohol with all recent med changes, wasn't sure if she could; no caffeine ? ?Diet: majority home cooked meals, eat out lunch 1-2 times per week; will add little salt when cooking;  vegetables mostly frozen or fresh; mix of proteins ? ?Exercise:  PREP signed up- has not heard from them yet ? ?Home BP readings:  ? AM: 28 readings average 140/80 HR 83 (range 125-161/60-94) ? PM: 25 readings average 138/76 HR 85 (range 121-157/66-89) ? ?Intolerances: amlodipine - edema ? ?Labs: 1/23:  Na 141, K 3.6, Glu 83, BUN 7, SCr 0.72, GFR 103 ? A1c 6.3, TC 174, TG 141, HDL 46, LDL  103 ? ? ?Wt Readings from Last 3 Encounters:  ?04/04/22 194 lb (88 kg)  ?03/01/22 198 lb 12.8 oz (90.2 kg)  ?02/27/22 195 lb (88.5 kg)  ? ?BP Readings from Last 3 Encounters:  ?04/04/22 134/77  ?03/01/22 (!) 152/104  ?02/27/22 120/84  ? ?Pulse Readings from Last 3 Encounters:  ?04/04/22 71  ?03/01/22 82  ?02/27/22 85  ? ? ?Current Outpatient Medications  ?Medication Sig Dispense Refill  ? amLODipine (NORVASC) 5 MG tablet Take 1 tablet (5 mg total) by mouth daily. 90 tablet 3  ? Blood Glucose Monitoring Suppl (ONE TOUCH ULTRA MINI) w/Device KIT 1 each by Does not apply route 3 (three) times daily as needed. 1 kit 1  ? EPINEPHrine 0.3 mg/0.3 mL IJ SOAJ injection Inject 0.3 mLs into the muscle as needed. For Anaphylaxis    ? glucose blood test strip Use as instructed 100 each 12  ? hydrALAZINE (APRESOLINE) 50 MG tablet Take 1 tablet (50 mg total) by mouth 3 (three) times daily. 90 tablet 6  ? Lancets (ONETOUCH ULTRASOFT) lancets Use as instructed 100 each 12  ? nebivolol (BYSTOLIC) 10 MG tablet Take 1 tablet (10 mg total) by mouth daily. 30 tablet 5  ? norethindrone (NORLYDA) 0.35 MG tablet TAKE 1 TABLET(0.35 MG) BY MOUTH DAILY 84 tablet 3  ? Semaglutide, 2 MG/DOSE, (OZEMPIC, 2 MG/DOSE,) 8 MG/3ML  SOPN Inject 2 mg into the skin once a week. 3 mL 5  ? valsartan-hydrochlorothiazide (DIOVAN-HCT) 160-12.5 MG tablet Take 1 tablet by mouth 2 times per day 180 tablet 1  ? ?No current facility-administered medications for this visit.  ? ? ?No Active Allergies ? ?Past Medical History:  ?Diagnosis Date  ? Abnormal EKG   ? Abnormal Pap smear of cervix about 2013, 2016  ? Colpo biopsy with normal result per pt.  colpo 2016 HPV changes  ? Diabetes mellitus without complication ([place])   ? DUB (dysfunctional uterine bleeding)   ? around 2012, saw gyn at the time for eval  ? Elevated hemoglobin A1c   ? Fibroid   ? multiple fibroids  ? Hypertension   ? Left ovarian cyst 05/2018  ? do yearly ultrasound.  Doing GYN ONC consulation for  monitoring.   ? Migraine   ? OSA on CPAP   ? Resistant hypertension 05/02/2021  ? ? ?Blood pressure 134/77, pulse 71, height 5' 4"  (1.626 m), weight 194 lb (88 kg). ? ?Resistant hypertension ?Patient with resistant hypertension, still not at goal, but improving.  Will have her increase hydralazine to 50 mg tid and continue with all other medications.  She has not had BMET since starting valsartan/hydrochlorothizade combination, so will have that drawn today.  She will continue with home monitoring and return in 1 month for follow up.   ? ? ?Tommy Medal PharmD CPP [facility] ?[address] ?[place], [place] [address] ?[phone] ?

## 2022-04-09 ENCOUNTER — Encounter (HOSPITAL_BASED_OUTPATIENT_CLINIC_OR_DEPARTMENT_OTHER): Payer: Self-pay | Admitting: Cardiovascular Disease

## 2022-04-09 DIAGNOSIS — Z79899 Other long term (current) drug therapy: Secondary | ICD-10-CM

## 2022-04-09 MED ORDER — POTASSIUM CHLORIDE CRYS ER 20 MEQ PO TBCR: 20.0000 meq | EXTENDED_RELEASE_TABLET | Freq: Every day | ORAL | 0 refills | Status: DC

## 2022-04-09 NOTE — Telephone Encounter (Signed)
Potassium only mildly low at 3.4. Recommend potassium 21mq daily x 3 days. Repeat BMP in 1 week for monitoring.  ?Creatinine only mildly elevated. Recommend she increase oral hydration.  ? ?Agree with Kristin's recommendation to increase Hydralazine dose to 568m This will help blood pressure and will not hurt the kidneys nor affect potassium level. Getting blood pressure well controlled will actually help to protect the kidneys.  ? ?CCing KrCrittendens FYI only as labs went to her inbasket ? ?CaLoel DubonnetNP  ? ? ?

## 2022-04-10 ENCOUNTER — Ambulatory Visit
Admission: RE | Admit: 2022-04-10 | Discharge: 2022-04-10 | Disposition: A | Discharge status: Home / Self Care | Payer: Managed Care, Other (non HMO) | Source: Ambulatory Visit | Attending: Nurse Practitioner | Admitting: Nurse Practitioner

## 2022-04-10 ENCOUNTER — Other Ambulatory Visit (HOSPITAL_BASED_OUTPATIENT_CLINIC_OR_DEPARTMENT_OTHER): Payer: Self-pay | Admitting: Family

## 2022-04-10 DIAGNOSIS — Z1231 Encounter for screening mammogram for malignant neoplasm of breast: Secondary | ICD-10-CM

## 2022-04-16 ENCOUNTER — Ambulatory Visit: Payer: Managed Care, Other (non HMO) | Admitting: Nurse Practitioner

## 2022-04-16 VITALS — BP 132/76 | HR 79 | Temp 98.4°F | Ht 64.0 in | Wt 193.6 lb

## 2022-04-16 DIAGNOSIS — R202 Paresthesia of skin: Secondary | ICD-10-CM

## 2022-04-16 DIAGNOSIS — E669 Obesity, unspecified: Secondary | ICD-10-CM

## 2022-04-16 DIAGNOSIS — I10 Essential (primary) hypertension: Secondary | ICD-10-CM

## 2022-04-16 DIAGNOSIS — Z6833 Body mass index (BMI) 33.0-33.9, adult: Secondary | ICD-10-CM

## 2022-04-16 DIAGNOSIS — E6609 Other obesity due to excess calories: Secondary | ICD-10-CM

## 2022-04-16 DIAGNOSIS — E1169 Type 2 diabetes mellitus with other specified complication: Secondary | ICD-10-CM | POA: Diagnosis not present

## 2022-04-16 DIAGNOSIS — R7989 Other specified abnormal findings of blood chemistry: Secondary | ICD-10-CM

## 2022-04-16 NOTE — Patient Instructions (Signed)
Hypertension, Adult ?High blood pressure (hypertension) is when the force of blood pumping through the arteries is too strong. The arteries are the blood vessels that carry blood from the heart throughout the body. Hypertension forces the heart to work harder to pump blood and may cause arteries to become narrow or stiff. Untreated or uncontrolled hypertension can lead to a heart attack, heart failure, a stroke, kidney disease, and other problems. ?A blood pressure reading consists of a higher number over a lower number. Ideally, your blood pressure should be below 120/80. The first ("top") number is called the systolic pressure. It is a measure of the pressure in your arteries as your heart beats. The second ("bottom") number is called the diastolic pressure. It is a measure of the pressure in your arteries as the heart relaxes. ?What are the causes? ?The exact cause of this condition is not known. There are some conditions that result in high blood pressure. ?What increases the risk? ?Certain factors may make you more likely to develop high blood pressure. Some of these risk factors are under your control, including: ?Smoking. ?Not getting enough exercise or physical activity. ?Being overweight. ?Having too much fat, sugar, calories, or salt (sodium) in your diet. ?Drinking too much alcohol. ?Other risk factors include: ?Having a personal history of heart disease, diabetes, high cholesterol, or kidney disease. ?Stress. ?Having a family history of high blood pressure and high cholesterol. ?Having obstructive sleep apnea. ?Age. The risk increases with age. ?What are the signs or symptoms? ?High blood pressure may not cause symptoms. Very high blood pressure (hypertensive crisis) may cause: ?Headache. ?Fast or irregular heartbeats (palpitations). ?Shortness of breath. ?Nosebleed. ?Nausea and vomiting. ?Vision changes. ?Severe chest pain, dizziness, and seizures. ?How is this diagnosed? ?This condition is diagnosed by  measuring your blood pressure while you are seated, with your arm resting on a flat surface, your legs uncrossed, and your feet flat on the floor. The cuff of the blood pressure monitor will be placed directly against the skin of your upper arm at the level of your heart. Blood pressure should be measured at least twice using the same arm. Certain conditions can cause a difference in blood pressure between your right and left arms. ?If you have a high blood pressure reading during one visit or you have normal blood pressure with other risk factors, you may be asked to: ?Return on a different day to have your blood pressure checked again. ?Monitor your blood pressure at home for 1 week or longer. ?If you are diagnosed with hypertension, you may have other blood or imaging tests to help your health care provider understand your overall risk for other conditions. ?How is this treated? ?This condition is treated by making healthy lifestyle changes, such as eating healthy foods, exercising more, and reducing your alcohol intake. You may be referred for counseling on a healthy diet and physical activity. ?Your health care provider may prescribe medicine if lifestyle changes are not enough to get your blood pressure under control and if: ?Your systolic blood pressure is above 130. ?Your diastolic blood pressure is above 80. ?Your personal target blood pressure may vary depending on your medical conditions, your age, and other factors. ?Follow these instructions at home: ?Eating and drinking ? ?Eat a diet that is high in fiber and potassium, and low in sodium, added sugar, and fat. An example of this eating plan is called the DASH diet. DASH stands for Dietary Approaches to Stop Hypertension. To eat this way: ?Eat  plenty of fresh fruits and vegetables. Try to fill one half of your plate at each meal with fruits and vegetables. ?Eat whole grains, such as whole-wheat pasta, brown rice, or whole-grain bread. Fill about one  fourth of your plate with whole grains. ?Eat or drink low-fat dairy products, such as skim milk or low-fat yogurt. ?Avoid fatty cuts of meat, processed or cured meats, and poultry with skin. Fill about one fourth of your plate with lean proteins, such as fish, chicken without skin, beans, eggs, or tofu. ?Avoid pre-made and processed foods. These tend to be higher in sodium, added sugar, and fat. ?Reduce your daily sodium intake. Many people with hypertension should eat less than 1,500 mg of sodium a day. ?Do not drink alcohol if: ?Your health care provider tells you not to drink. ?You are pregnant, may be pregnant, or are planning to become pregnant. ?If you drink alcohol: ?Limit how much you have to: ?0-1 drink a day for women. ?0-2 drinks a day for men. ?Know how much alcohol is in your drink. In the [place] one drink equals one 12 oz bottle of beer (355 mL), one 5 oz glass of wine (148 mL), or one 1? oz glass of hard liquor (44 mL). ?Lifestyle ? ?Work with your health care provider to maintain a healthy body weight or to lose weight. Ask what an ideal weight is for you. ?Get at least 30 minutes of exercise that causes your heart to beat faster (aerobic exercise) most days of the week. Activities may include walking, swimming, or biking. ?Include exercise to strengthen your muscles (resistance exercise), such as Pilates or lifting weights, as part of your weekly exercise routine. Try to do these types of exercises for 30 minutes at least 3 days a week. ?Do not use any products that contain nicotine or tobacco. These products include cigarettes, chewing tobacco, and vaping devices, such as e-cigarettes. If you need help quitting, ask your health care provider. ?Monitor your blood pressure at home as told by your health care provider. ?Keep all follow-up visits. This is important. ?Medicines ?Take over-the-counter and prescription medicines only as told by your health care provider. Follow directions carefully. Blood  pressure medicines must be taken as prescribed. ?Do not skip doses of blood pressure medicine. Doing this puts you at risk for problems and can make the medicine less effective. ?Ask your health care provider about side effects or reactions to medicines that you should watch for. ?Contact a health care provider if you: ?Think you are having a reaction to a medicine you are taking. ?Have headaches that keep coming back (recurring). ?Feel dizzy. ?Have swelling in your ankles. ?Have trouble with your vision. ?Get help right away if you: ?Develop a severe headache or confusion. ?Have unusual weakness or numbness. ?Feel faint. ?Have severe pain in your chest or abdomen. ?Vomit repeatedly. ?Have trouble breathing. ?These symptoms may be an emergency. Get help right away. Call 911. ?Do not wait to see if the symptoms will go away. ?Do not drive yourself to the hospital. ?Summary ?Hypertension is when the force of blood pumping through your arteries is too strong. If this condition is not controlled, it may put you at risk for serious complications. ?Your personal target blood pressure may vary depending on your medical conditions, your age, and other factors. For most people, a normal blood pressure is less than 120/80. ?Hypertension is treated with lifestyle changes, medicines, or a combination of both. Lifestyle changes include losing weight, eating a healthy,  low-sodium diet, exercising more, and limiting alcohol. ?This information is not intended to replace advice given to you by your health care provider. Make sure you discuss any questions you have with your health care provider. ?Document Revised: 10/24/2021 Document Reviewed: 10/24/2021 ?Elsevier Patient Education ? Early. ? ?

## 2022-04-16 NOTE — Progress Notes (Signed)
?Industrial/product designer as a Education administrator for [employer], FNP.,have documented all relevant documentation on the behalf of [name], FNP,as directed by  [name], FNP while in the presence of [name], [place]. ? ?This visit occurred during the SARS-CoV-2 public health emergency.  Safety protocols were in place, including screening questions prior to the visit, additional usage of staff PPE, and extensive cleaning of exam room while observing appropriate contact time as indicated for disinfecting solutions. ? ?Subjective:  ?  ? Patient ID: Melanie Little , female    DOB: Jul 19, 1971 , 51 y.o.   MRN: [id number] ? ? ?Chief Complaint  ?Patient presents with  ? Diabetes  ? Hypertension  ? ? ?HPI ? ?Pt here for BP & DM f/u.  ? ?She is taking Ozempic and tolerating well.  ? ?Diabetes ?She presents for her follow-up diabetic visit. She has type 2 diabetes mellitus. Her disease course has been stable. There are no hypoglycemic associated symptoms. Pertinent negatives for diabetes include no chest pain. There are no hypoglycemic complications. Risk factors for coronary artery disease include obesity, sedentary lifestyle and diabetes mellitus. Current diabetic treatment includes oral agent (monotherapy). She rarely participates in exercise.  ?Hypertension ?This is a chronic problem. The current episode started more than 1 year ago. The problem is controlled. Pertinent negatives include no chest pain or palpitations. There are no associated agents to hypertension. Past treatments include beta blockers, alpha 1 blockers and angiotensin blockers. There are no compliance problems.  There is no history of angina. There is no history of chronic renal disease.   ? ?Past Medical History:  ?Diagnosis Date  ? Abnormal EKG   ? Abnormal Pap smear of cervix about 2013, 2016  ? Colpo biopsy with normal result per pt.  colpo 2016 HPV changes  ? Diabetes mellitus without complication ([place])   ? DUB (dysfunctional uterine bleeding)   ?  around 2012, saw gyn at the time for eval  ? Elevated hemoglobin A1c   ? Fibroid   ? multiple fibroids  ? Hypertension   ? Left ovarian cyst 05/2018  ? do yearly ultrasound.  Doing GYN ONC consulation for monitoring.   ? Migraine   ? OSA on CPAP   ? Resistant hypertension 05/02/2021  ?  ? ?Family History  ?Problem Relation Age of Onset  ? CVA Mother 30  ?     deceased, was 43 at the age of her first stroke  ? Diabetes Father   ? Deep vein thrombosis Father   ? Breast cancer Maternal Aunt   ? Breast cancer Maternal Aunt   ? ? ? ?Current Outpatient Medications:  ?  amLODipine (NORVASC) 5 MG tablet, Take 1 tablet (5 mg total) by mouth daily., Disp: 90 tablet, Rfl: 3 ?  Blood Glucose Monitoring Suppl (ONE TOUCH ULTRA MINI) w/Device KIT, 1 each by Does not apply route 3 (three) times daily as needed., Disp: 1 kit, Rfl: 1 ?  EPINEPHrine 0.3 mg/0.3 mL IJ SOAJ injection, Inject 0.3 mLs into the muscle as needed. For Anaphylaxis, Disp: , Rfl:  ?  glucose blood test strip, Use as instructed, Disp: 100 each, Rfl: 12 ?  hydrALAZINE (APRESOLINE) 50 MG tablet, Take 1 tablet (50 mg total) by mouth 3 (three) times daily., Disp: 90 tablet, Rfl: 6 ?  Lancets (ONETOUCH ULTRASOFT) lancets, Use as instructed, Disp: 100 each, Rfl: 12 ?  nebivolol (BYSTOLIC) 10 MG tablet, Take 1 tablet (10 mg total) by mouth daily., Disp: 30 tablet, Rfl:  5 ?  norethindrone (NORLYDA) 0.35 MG tablet, TAKE 1 TABLET(0.35 MG) BY MOUTH DAILY, Disp: 84 tablet, Rfl: 3 ?  Semaglutide, 2 MG/DOSE, (OZEMPIC, 2 MG/DOSE,) 8 MG/3ML SOPN, Inject 2 mg into the skin once a week., Disp: 3 mL, Rfl: 5 ?  valsartan-hydrochlorothiazide (DIOVAN-HCT) 160-12.5 MG tablet, Take 1 tablet by mouth 2 times per day, Disp: 180 tablet, Rfl: 1  ? ?No Active Allergies  ? ?Review of Systems  ?Constitutional: Negative.   ?Respiratory: Negative.    ?Cardiovascular: Negative.  Negative for chest pain and palpitations.  ?Gastrointestinal: Negative.   ?Neurological: Negative.    ?Psychiatric/Behavioral: Negative.     ? ?Today's Vitals  ? 04/16/22 1539  ?BP: 132/76  ?Pulse: 79  ?Temp: 98.4 ?F (36.9 ?C)  ?TempSrc: Oral  ?Weight: 193 lb 9.6 oz (87.8 kg)  ?Height: _0  (1.626 m)  ? ?Body mass index is 33.23 kg/m?.  ?Wt Readings from Last 3 Encounters:  ?04/16/22 193 lb 9.6 oz (87.8 kg)  ?04/04/22 194 lb (88 kg)  ?03/01/22 198 lb 12.8 oz (90.2 kg)  ? ? ?Objective:  ?Physical Exam ?Vitals reviewed.  ?Constitutional:   ?   General: She is not in acute distress. ?   Appearance: Normal appearance. She is well-developed. She is obese.  ?Cardiovascular:  ?   Rate and Rhythm: Normal rate and regular rhythm.  ?   Pulses: Normal pulses.  ?   Heart sounds: Normal heart sounds. No murmur heard. ?Pulmonary:  ?   Effort: Pulmonary effort is normal. No respiratory distress.  ?   Breath sounds: Normal breath sounds. No wheezing.  ?Chest:  ?   Chest wall: No tenderness.  ?Musculoskeletal:     ?   General: Normal range of motion.  ?Skin: ?   General: Skin is warm and dry.  ?   Capillary Refill: Capillary refill takes less than 2 seconds.  ?Neurological:  ?   General: No focal deficit present.  ?   Mental Status: She is alert and oriented to person, place, and time.  ?   Cranial Nerves: No cranial nerve deficit.  ?   Motor: No weakness.  ?Psychiatric:     ?   Mood and Affect: Mood normal.     ?   Behavior: Behavior normal.     ?   Thought Content: Thought content normal.     ?   Judgment: Judgment normal.  ?  ? ?   ?Assessment And Plan:  ?   ?1. Resistant hypertension ?Comments: Blood pressure is better controlled today, continue follow up with Cardiology ? ?2. Class 1 obesity due to excess calories with serious comorbidity and body mass index (BMI) of 33.0 to 33.9 in adult ? She is encouraged to strive for BMI less than 30 to decrease cardiac risk. Advised to aim for at least 150 minutes of exercise per week. ? ?3. Type 2 diabetes mellitus with obesity ([place]) ?Comments: HgbA1c was slightly improved at last  visit, continue Ozempic tolerating well. Encouraged to incorporate more physical activity ?- Lipid panel ?- Hemoglobin A1c ? ?4. Abnormal blood creatinine level ?- BMP8+eGFR ? ?5. Tingling sensation ?- Vitamin B12 ? ? ? ? ?Patient was given opportunity to ask questions. Patient verbalized understanding of the plan and was able to repeat key elements of the plan. All questions were answered to their satisfaction.  ?[name], FNP  ? ?I, [name], FNP, have reviewed all documentation for this visit. The documentation on 04/16/22 for the exam,  diagnosis, procedures, and orders are all accurate and complete.  ? ?IF YOU HAVE BEEN REFERRED TO A SPECIALIST, IT MAY TAKE 1-2 WEEKS TO SCHEDULE/PROCESS THE REFERRAL. IF YOU HAVE NOT HEARD FROM US/SPECIALIST IN TWO WEEKS, PLEASE GIVE [place] A CALL AT [phone].  ? ?THE PATIENT IS ENCOURAGED TO PRACTICE SOCIAL DISTANCING DUE TO THE COVID-19 PANDEMIC.   ?

## 2022-04-17 ENCOUNTER — Encounter: Payer: Self-pay | Admitting: Nurse Practitioner

## 2022-04-17 LAB — LIPID PANEL
Chol/HDL Ratio: 3.5 ratio (ref 0.0–4.4)
Cholesterol, Total: 179 mg/dL (ref 100–199)
HDL: 51 mg/dL (ref >39)
LDL Chol Calc (NIH): 91 mg/dL (ref 0–99)
Triglycerides: 221 mg/dL — ABNORMAL HIGH (ref 0–149)
VLDL Cholesterol Cal: 37 mg/dL (ref 5–40)

## 2022-04-17 LAB — BMP8+EGFR
BUN/Creatinine Ratio: 10 (ref 9–23)
BUN: 10 mg/dL (ref 6–24)
CO2: 24 mmol/L (ref 20–29)
Calcium: 10.2 mg/dL (ref 8.7–10.2)
Chloride: 99 mmol/L (ref 96–106)
Creatinine, Ser: 1.04 mg/dL — ABNORMAL HIGH (ref 0.57–1.00)
Glucose: 106 mg/dL — ABNORMAL HIGH (ref 70–99)
Potassium: 3.4 mmol/L — ABNORMAL LOW (ref 3.5–5.2)
Sodium: 141 mmol/L (ref 134–144)
eGFR: 66 mL/min/{1.73_m2} (ref >59)

## 2022-04-17 LAB — HEMOGLOBIN A1C
Est. average glucose Bld gHb Est-mCnc: 140 mg/dL
Hgb A1c MFr Bld: 6.5 % — ABNORMAL HIGH (ref 4.8–5.6)

## 2022-04-17 LAB — VITAMIN B12: Vitamin B-12: 506 pg/mL (ref 232–1245)

## 2022-04-18 NOTE — Telephone Encounter (Signed)
Please advise 

## 2022-04-27 ENCOUNTER — Encounter: Payer: Self-pay | Admitting: Nurse Practitioner

## 2022-05-02 ENCOUNTER — Encounter (HOSPITAL_BASED_OUTPATIENT_CLINIC_OR_DEPARTMENT_OTHER): Payer: Self-pay | Admitting: Pharmacist Clinician (PhC)/ Clinical Pharmacy Specialist

## 2022-05-02 ENCOUNTER — Ambulatory Visit (HOSPITAL_BASED_OUTPATIENT_CLINIC_OR_DEPARTMENT_OTHER): Payer: Managed Care, Other (non HMO) | Admitting: Pharmacist Clinician (PhC)/ Clinical Pharmacy Specialist

## 2022-05-02 VITALS — BP 126/80 | HR 73 | Ht 64.0 in | Wt 196.0 lb

## 2022-05-02 DIAGNOSIS — I10 Essential (primary) hypertension: Secondary | ICD-10-CM | POA: Diagnosis not present

## 2022-05-02 MED ORDER — VALSARTAN 160 MG PO TABS: 160.0000 mg | ORAL_TABLET | Freq: Every day | ORAL | 3 refills | Status: DC

## 2022-05-02 MED ORDER — SPIRONOLACTONE 25 MG PO TABS: 12.5000 mg | ORAL_TABLET | Freq: Every day | ORAL | 3 refills | Status: DC

## 2022-05-02 NOTE — Assessment & Plan Note (Signed)
Patient with resistant hypertension, currently at goal in office and with morning home readings.  Unfortunately she is frustrated with medication side effects and wants to switch out for meds without these issues.  She cannot tolerate increasing amlodipine 2/2 edema, so will have her stop valsartan hctz and start valsartan 160 mg daily.  She will also add spironolactone 12.5 mg daily and continue with nebivolol.   She can decrease the hydralazine to 25 mg am and noon, continuing 50 mg in the evenings.  We will give her a month on this combination, aware that her pressure may increase some, but hopefully eliminating her concerns.  She will continue with home monitoring and we will see her back in office in one month for follow up.  ?

## 2022-05-02 NOTE — Patient Instructions (Signed)
Return for a a follow up appointment Friday June 16 at 8 am ? ?Go to the lab in 10 days to check kidney function and potassium levels ? ?Check your blood pressure at home daily and keep record of the readings. ? ?Take your BP meds as follows: ? AM: amlodipine 5 mg, hydralazine 25 mg, valsartan 160 mg ? Noon: hydralazine 25 mg ? PM: nebivolol 10 mg, spironolactone 12.5 mg, hydralazine 50 mg  ? ?Bring all of your meds, your BP cuff and your record of home blood pressures to your next appointment.  Exercise as you?re able, try to walk approximately 30 minutes per day.  Keep salt intake to a minimum, especially watch canned and prepared boxed foods.  Eat more fresh fruits and vegetables and fewer canned items.  Avoid eating in fast food restaurants.  ? ? HOW TO TAKE YOUR BLOOD PRESSURE: ?Rest 5 minutes before taking your blood pressure. ? Don?t smoke or drink caffeinated beverages for at least 30 minutes before. ?Take your blood pressure before (not after) you eat. ?Sit comfortably with your back supported and both feet on the floor (don?t cross your legs). ?Elevate your arm to heart level on a table or a desk. ?Use the proper sized cuff. It should fit smoothly and snugly around your bare upper arm. There should be enough room to slip a fingertip under the cuff. The bottom edge of the cuff should be 1 inch above the crease of the elbow. ?Ideally, take 3 measurements at one sitting and record the average. ? ? ?

## 2022-05-02 NOTE — Progress Notes (Signed)
? ? ? ?05/02/2022 ?Melanie Little ?26-Jul-1971 ?[id number] ? ? ?HPI:  Melanie Little is a 51 y.o. female patient of Dr Oval Linsey, with a [place] below who presents today for advanced hypertension clinic follow up.  Patient was seen by Dr. Oval Linsey last month, at which time her blood pressure was 152/104.  She was agreeable to our remote patient monitoring study and randomized to group 1.  Patient reported developing hypertension while in her 32's, but just in the past few years has been hard to control.  At her last visit we increased hydralazine to 50 mg three times daily.  Unfortunately her labs showed a > 40% increase in SCr, as well as drop in potassium, most likely attributable to doubling of valsartan hctz in February.  She was given 3 days of potassium supplementation and encouraged to stay well hydrated.  Repeat labs were unchanged.   ? ?Today she is in the office for follow up.  She is rather concerned about changes in kidney function and potassium level.  She complains of fatigue, to the point where she is napping in her car at lunch hour, has tingling in her hands and feet and her legs feel as though she is lacking circulation.   ? ? ?Past Medical History: ?DM2 A1c 6.3 on Ozempic 2 mg weekly  ?OSA Compliant with CPAP  ?migraine Occurs about once a month, treats with ibuprofen  ?  ?Blood Pressure Goal:  130/80 ? ?Current Medications: amlodipine 5 mg qd - am, valsartan hct 160/12.5 mg bid, nebivolol 10 mg qd - pm, hydralazine 50 mg tid ? ?Family Hx:  mother with history of stroke, first in her 93's, had 45 in 90 years, now deceased - cause of strokes was never discovered; father with hypertension, DM, living; no full siblings; 1 son now 51 healthy ? ?Social Hx: no tobacco, no alcohol with all recent med changes, wasn't sure if she could; no caffeine ? ?Diet: majority home cooked meals, eat out lunch 1-2 times per week; will add little salt when cooking;  vegetables mostly frozen or fresh; mix of  proteins ? ?Exercise:  PREP signed up- has not heard from them yet ? ?Home BP readings:  ? AM: 26 readings average  129/72 (range 109-141/59-82) previous average 140/80 ? PM: 15 readings average  134/74 (range 111-145/54-90) previous average 138/76 ? ?Intolerances: amlodipine 10 mg- edema ? Benazepril - chest pain ? ?Labs: 04/04/22:  na 140, K 3.4, Glu 108, BUN 14, SCr 1.02. GFR 68 ? ?1/23:  Na 141, K 3.6, Glu 83, BUN 7, SCr 0.72, GFR 103 ? A1c 6.3, TC 174, TG 141, HDL 46, LDL 103 ? ? ?Wt Readings from Last 3 Encounters:  ?05/02/22 196 lb (88.9 kg)  ?04/16/22 193 lb 9.6 oz (87.8 kg)  ?04/04/22 194 lb (88 kg)  ? ?BP Readings from Last 3 Encounters:  ?05/02/22 126/80  ?04/16/22 132/76  ?04/04/22 134/77  ? ?Pulse Readings from Last 3 Encounters:  ?05/02/22 73  ?04/16/22 79  ?04/04/22 71  ? ? ?Current Outpatient Medications  ?Medication Sig Dispense Refill  ? amLODipine (NORVASC) 5 MG tablet Take 1 tablet (5 mg total) by mouth daily. 90 tablet 3  ? EPINEPHrine 0.3 mg/0.3 mL IJ SOAJ injection Inject 0.3 mLs into the muscle as needed. For Anaphylaxis    ? hydrALAZINE (APRESOLINE) 50 MG tablet Take 1 tablet (50 mg total) by mouth 3 (three) times daily. 90 tablet 6  ? nebivolol (BYSTOLIC) 10 MG tablet Take 1  tablet (10 mg total) by mouth daily. 30 tablet 5  ? norethindrone (NORLYDA) 0.35 MG tablet TAKE 1 TABLET(0.35 MG) BY MOUTH DAILY 84 tablet 3  ? Semaglutide, 2 MG/DOSE, (OZEMPIC, 2 MG/DOSE,) 8 MG/3ML SOPN Inject 2 mg into the skin once a week. 3 mL 5  ? spironolactone (ALDACTONE) 25 MG tablet Take 0.5 tablets (12.5 mg total) by mouth daily. 45 tablet 3  ? valsartan (DIOVAN) 160 MG tablet Take 1 tablet (160 mg total) by mouth daily. 90 tablet 3  ? Blood Glucose Monitoring Suppl (ONE TOUCH ULTRA MINI) w/Device KIT 1 each by Does not apply route 3 (three) times daily as needed. (Patient not taking: Reported on 05/02/2022) 1 kit 1  ? glucose blood test strip Use as instructed (Patient not taking: Reported on 05/02/2022) 100 each  12  ? Lancets (ONETOUCH ULTRASOFT) lancets Use as instructed (Patient not taking: Reported on 05/02/2022) 100 each 12  ? ?No current facility-administered medications for this visit.  ? ? ?No Active Allergies ? ?Past Medical History:  ?Diagnosis Date  ? Abnormal EKG   ? Abnormal Pap smear of cervix about 2013, 2016  ? Colpo biopsy with normal result per pt.  colpo 2016 HPV changes  ? Diabetes mellitus without complication ([place])   ? DUB (dysfunctional uterine bleeding)   ? around 2012, saw gyn at the time for eval  ? Elevated hemoglobin A1c   ? Fibroid   ? multiple fibroids  ? Hypertension   ? Left ovarian cyst 05/2018  ? do yearly ultrasound.  Doing GYN ONC consulation for monitoring.   ? Migraine   ? OSA on CPAP   ? Resistant hypertension 05/02/2021  ? ? ?Blood pressure 126/80, pulse 73, height 5' 4"  (1.626 m), weight 196 lb (88.9 kg). ? ?Resistant hypertension ?Patient with resistant hypertension, currently at goal in office and with morning home readings.  Unfortunately she is frustrated with medication side effects and wants to switch out for meds without these issues.  She cannot tolerate increasing amlodipine 2/2 edema, so will have her stop valsartan hctz and start valsartan 160 mg daily.  She will also add spironolactone 12.5 mg daily and continue with nebivolol.   She can decrease the hydralazine to 25 mg am and noon, continuing 50 mg in the evenings.  We will give her a month on this combination, aware that her pressure may increase some, but hopefully eliminating her concerns.  She will continue with home monitoring and we will see her back in office in one month for follow up.  ? ? ? ?Tommy Medal PharmD CPP [facility] ?[place] ?[address] ?[place], [place] [address] ?[phone] ?

## 2022-05-11 ENCOUNTER — Telehealth (HOSPITAL_BASED_OUTPATIENT_CLINIC_OR_DEPARTMENT_OTHER): Payer: Self-pay | Admitting: Cardiovascular Disease

## 2022-05-11 ENCOUNTER — Encounter (HOSPITAL_BASED_OUTPATIENT_CLINIC_OR_DEPARTMENT_OTHER): Payer: Self-pay | Admitting: Cardiovascular Disease

## 2022-05-11 DIAGNOSIS — I10 Essential (primary) hypertension: Secondary | ICD-10-CM

## 2022-05-11 DIAGNOSIS — Z79899 Other long term (current) drug therapy: Secondary | ICD-10-CM

## 2022-05-11 MED ORDER — SPIRONOLACTONE 25 MG PO TABS: 25.0000 mg | ORAL_TABLET | Freq: Every day | ORAL | 3 refills | Status: DC

## 2022-05-11 NOTE — Telephone Encounter (Signed)
Skeet Latch, MD ?to Gerald Stabs, RN  Me  Rockne Menghini, RPH-CPP   ?   5:10 PM ?Called and spoke to the patient.  I suspect that not having the hydrochlorothiazide any more is was causing her swelling.  Her numbness and tingling seems to be improving.  This was probably because of the hypokalemia and is improving now that she is on spironolactone.  Recommended that she get compression socks and elevate her legs whenever sitting this weekend.  I also recommended that she increase the spironolactone to 25 mg.  She will come for a basic metabolic panel on Monday.  Please add a magnesium.  Please update her prescription to 25 mg in the computer.  ? ?Tiffany C. Oval Linsey, MD, [facility]  ?05/11/2022  ?5:10 PM  ? ?Kaila RN advised patient  ?

## 2022-05-11 NOTE — Telephone Encounter (Signed)
Follow up response to previously routed telephone encounter ?

## 2022-05-11 NOTE — Telephone Encounter (Signed)
RN Returning call to patient to discuss her mychart message. Patient message was responded to but what not read by patient.  ? ? ?Patient describes a numbness and  tingling, and swelling in her foot. She believes this is due to repeated medication changes stating, " I am annoyed because y'all change my medicine every month." Patient states " My symptoms must be from the medication changes" Patient states that she does not eat salt and elevating her leg does not make sense to her.  ? ? ?Routing to Dr. Oval Linsey, Tommy Medal, and  Alvina Filbert for advisement.  ?

## 2022-05-11 NOTE — Telephone Encounter (Signed)
Labs ordered and prescription updated per Dr. Oval Linsey request. ?

## 2022-05-11 NOTE — Telephone Encounter (Signed)
Dr Oval Linsey spoke with patient  ?

## 2022-05-11 NOTE — Telephone Encounter (Signed)
Patient was calling in regarding her my chart message. Please advise  ?

## 2022-05-15 LAB — BASIC METABOLIC PANEL
BUN/Creatinine Ratio: 10 (ref 9–23)
BUN: 9 mg/dL (ref 6–24)
CO2: 21 mmol/L (ref 20–29)
Calcium: 9 mg/dL (ref 8.7–10.2)
Chloride: 103 mmol/L (ref 96–106)
Creatinine, Ser: 0.88 mg/dL (ref 0.57–1.00)
Glucose: 90 mg/dL (ref 70–99)
Potassium: 4.4 mmol/L (ref 3.5–5.2)
Sodium: 137 mmol/L (ref 134–144)
eGFR: 81 mL/min/{1.73_m2} (ref >59)

## 2022-06-14 NOTE — Progress Notes (Signed)
   06/19/2022 Melanie Little 11/08/1971 [id number]   HPI:  Melanie Little is a 51 y.o. female patient of Dr Ina, with a PMH below who presents today for advanced hypertension clinic follow up.  Patient was seen by Dr. [place] last month, at which time her blood pressure was 152/104.  She was agreeable to our remote patient monitoring study and randomized to group 1.  Patient reported developing hypertension while in her 30's, but just in the past few years has been hard to control.  At her last visit we increased hydralazine to 50 mg three times daily.  Unfortunately her labs showed a > 40% increase in SCr, as well as drop in potassium, most likely attributable to doubling of valsartan hctz in February.  She was given 3 days of potassium supplementation and encouraged to stay well hydrated.  Repeat labs were unchanged.  At her last visit multiple changes were made.  Amlodipine was stopped due to edema and hydrochlorothiazide was switched to spironolactone because of low potassium levels.  We also decreased hydralazine to 25/25/50, as she felt it was making her lethargic.   Today she returns today for follow up.  Notes that the tingling in her hands and feet has cut back significantly, although still occasionally occurs.   Past Medical History: DM2 A1c 6.3 on Ozempic 2 mg weekly  OSA Compliant with CPAP  migraine Occurs about once a month, treats with ibuprofen    Blood Pressure Goal:  130/80  Current Medications: amlodipine 5 mg qd - am, valsartan hct 160 am, nebivolol 10 mg qd - pm, hydralazine 50 mg tid  Family Hx:  mother with history of stroke, first in her 30's, had 9 in 11 years, now deceased - cause of strokes was never discovered; father with hypertension, DM, living; no full siblings; 1 son now 25 healthy  Social Hx: no tobacco, no alcohol with all recent med changes, wasn't sure if she could; no caffeine  Diet: majority home cooked meals, eat out lunch 1-2 times per week;  will add little salt when cooking;  vegetables mostly frozen or fresh; mix of proteins  Exercise:  PREP signed up- has not heard from them yet  Home BP readings:   AM: 6 readings average 137/83  Previous average 129/72   PM: 10 readings average  138/73  Previous average 134/74   Intolerances: amlodipine 10 mg- edema  Benazepril - chest pain  Labs:  05/14/22:  Na 137, K 4.4, Glu 90, BUN 9, SCr 0.88, GFR 81 04/04/22:  Na 140, K 3.4, Glu 108, BUN 14, SCr 1.02. GFR 68  1/23:  Na 141, K 3.6, Glu 83, BUN 7, SCr 0.72, GFR 103  A1c 6.3, TC 174, TG 141, HDL 46, LDL 103   Wt Readings from Last 3 Encounters:  06/15/22 193 lb 12.8 oz (87.9 kg)  05/02/22 196 lb (88.9 kg)  04/16/22 193 lb 9.6 oz (87.8 kg)   BP Readings from Last 3 Encounters:  06/15/22 140/88  05/02/22 126/80  04/16/22 132/76   Pulse Readings from Last 3 Encounters:  06/15/22 80  05/02/22 73  04/16/22 79    Current Outpatient Medications  Medication Sig Dispense Refill   amLODipine (NORVASC) 5 MG tablet Take 1 tablet (5 mg total) by mouth daily. 90 tablet 3   Blood Glucose Monitoring Suppl (ONE TOUCH ULTRA MINI) w/Device KIT 1 each by Does not apply route 3 (three) times daily as needed. 1 kit 1     EPINEPHrine 0.3 mg/0.3 mL IJ SOAJ injection Inject 0.3 mLs into the muscle as needed. For Anaphylaxis     glucose blood test strip Use as instructed 100 each 12   hydrALAZINE (APRESOLINE) 50 MG tablet Take 1 tablet (50 mg total) by mouth 3 (three) times daily. 90 tablet 6   Lancets (ONETOUCH ULTRASOFT) lancets Use as instructed 100 each 12   nebivolol (BYSTOLIC) 10 MG tablet Take 1 tablet (10 mg total) by mouth daily. 30 tablet 5   norethindrone (NORLYDA) 0.35 MG tablet TAKE 1 TABLET(0.35 MG) BY MOUTH DAILY 84 tablet 3   Semaglutide, 2 MG/DOSE, (OZEMPIC, 2 MG/DOSE,) 8 MG/3ML SOPN Inject 2 mg into the skin once a week. 3 mL 5   spironolactone (ALDACTONE) 25 MG tablet Take 1 tablet (25 mg total) by mouth daily. 90 tablet 3    valsartan (DIOVAN) 160 MG tablet Take 1 tablet (160 mg total) by mouth daily. 90 tablet 3   No current facility-administered medications for this visit.    No Active Allergies  Past Medical History:  Diagnosis Date   Abnormal EKG    Abnormal Pap smear of cervix about 2013, 2016   Colpo biopsy with normal result per pt.  colpo 2016 HPV changes   Diabetes mellitus without complication ([place])    DUB (dysfunctional uterine bleeding)    around 2012, saw gyn at the time for eval   Elevated hemoglobin A1c    Fibroid    multiple fibroids   Hypertension    Left ovarian cyst 05/2018   do yearly ultrasound.  Doing GYN ONC consulation for monitoring.    Migraine    OSA on CPAP    Resistant hypertension 05/02/2021    Blood pressure 140/88, pulse 80, height 5' 4" (1.626 m), weight 193 lb 12.8 oz (87.9 kg).  Resistant hypertension Patient with hard to treat hypertension, in part due to challenges with medication.  Home average is higher this month, however only a few readings from which to judge.  She is not interested in increasing medications at this time.  Will reach out to [employer] exercise group and see if they can get her into one of their classes.  She is scheduled to meet with Dr. Oval Linsey in July.     Tommy Medal PharmD CPP [facility] [address] [place], [place] [address] [phone]

## 2022-06-15 ENCOUNTER — Encounter (HOSPITAL_BASED_OUTPATIENT_CLINIC_OR_DEPARTMENT_OTHER): Payer: Self-pay | Admitting: Pharmacist Clinician (PhC)/ Clinical Pharmacy Specialist

## 2022-06-15 ENCOUNTER — Ambulatory Visit (HOSPITAL_BASED_OUTPATIENT_CLINIC_OR_DEPARTMENT_OTHER): Payer: Managed Care, Other (non HMO) | Admitting: Pharmacist Clinician (PhC)/ Clinical Pharmacy Specialist

## 2022-06-15 DIAGNOSIS — I10 Essential (primary) hypertension: Secondary | ICD-10-CM | POA: Diagnosis not present

## 2022-06-15 NOTE — Patient Instructions (Signed)
Return for a a follow up appointment with Dr. Oval Linsey July 20 at 10 am  Check your blood pressure at home daily and keep record of the readings.  Please send in a copy of your home BP readings through [place].  If those readings are still elevated, we can consider increasing spironolactone or valsartan  Take your BP meds as follows:  Continue with your current medications  Bring all of your meds, your BP cuff and your record of home blood pressures to your next appointment.  Exercise as you're able, try to walk approximately 30 minutes per day.  Keep salt intake to a minimum, especially watch canned and prepared boxed foods.  Eat more fresh fruits and vegetables and fewer canned items.  Avoid eating in fast food restaurants.    HOW TO TAKE YOUR BLOOD PRESSURE: Rest 5 minutes before taking your blood pressure.  Don't smoke or drink caffeinated beverages for at least 30 minutes before. Take your blood pressure before (not after) you eat. Sit comfortably with your back supported and both feet on the floor (don't cross your legs). Elevate your arm to heart level on a table or a desk. Use the proper sized cuff. It should fit smoothly and snugly around your bare upper arm. There should be enough room to slip a fingertip under the cuff. The bottom edge of the cuff should be 1 inch above the crease of the elbow. Ideally, take 3 measurements at one sitting and record the average.

## 2022-06-17 ENCOUNTER — Encounter (HOSPITAL_BASED_OUTPATIENT_CLINIC_OR_DEPARTMENT_OTHER): Payer: Self-pay | Admitting: Pharmacist Clinician (PhC)/ Clinical Pharmacy Specialist

## 2022-06-19 NOTE — Assessment & Plan Note (Signed)
Patient with hard to treat hypertension, in part due to challenges with medication.  Home average is higher this month, however only a few readings from which to judge.  She is not interested in increasing medications at this time.  Will reach out to [employer] exercise group and see if they can get her into one of their classes.  She is scheduled to meet with Dr. Oval Linsey in July.

## 2022-06-20 ENCOUNTER — Telehealth: Payer: Self-pay

## 2022-06-20 NOTE — Telephone Encounter (Signed)
Received referral from Pharm D for PRE[facility] LVMT pt requesting call back to discuss

## 2022-06-26 ENCOUNTER — Other Ambulatory Visit: Payer: Self-pay

## 2022-06-26 DIAGNOSIS — E669 Obesity, unspecified: Secondary | ICD-10-CM

## 2022-06-26 MED ORDER — OZEMPIC (2 MG/DOSE) 8 MG/3ML ~~LOC~~ SOPN: 2.0000 mg | PEN_INJECTOR | SUBCUTANEOUS | 5 refills | Status: DC

## 2022-07-05 ENCOUNTER — Telehealth: Payer: Self-pay

## 2022-07-06 NOTE — Telephone Encounter (Signed)
VMF from pt returning call reference PREP referral  Call to pt on 07/05/22-explained PREP to pt as well as schedule and locations.  Will need an evening class. Explained next one will be starting in Sept. At [facility]  T/TH [phone]  Advised will call her closer to start of class to do intake appt

## 2022-07-19 ENCOUNTER — Ambulatory Visit (HOSPITAL_BASED_OUTPATIENT_CLINIC_OR_DEPARTMENT_OTHER): Payer: Managed Care, Other (non HMO) | Admitting: Cardiovascular Disease

## 2022-07-19 ENCOUNTER — Encounter (HOSPITAL_BASED_OUTPATIENT_CLINIC_OR_DEPARTMENT_OTHER): Payer: Self-pay | Admitting: Cardiovascular Disease

## 2022-07-19 DIAGNOSIS — G4733 Obstructive sleep apnea (adult) (pediatric): Secondary | ICD-10-CM | POA: Diagnosis not present

## 2022-07-19 DIAGNOSIS — I10 Essential (primary) hypertension: Secondary | ICD-10-CM | POA: Diagnosis not present

## 2022-07-19 DIAGNOSIS — Z006 Encounter for examination for normal comparison and control in clinical research program: Secondary | ICD-10-CM

## 2022-07-19 NOTE — Assessment & Plan Note (Signed)
Blood pressures have been better controlled.  She will keep working on diet and exercise.  She is struggled with side effects to medications.  Continue amlodipine, hydralazine, nebivolol, spironolactone, and valsartan.  We discussed the fact that she would not qualify for renal denervation under investigational status now given that her blood pressures are pretty well controlled.  However would consider the procedure after FDA approval so that she can potentially get off of some of her medicines.

## 2022-07-19 NOTE — Research (Signed)
I saw pt today after Dr. Blenda Mounts follow up visit. Pt is in Dr. Blenda Mounts Virtual Care HTN Study. Pt filled out research survey. Pt was enrolled in Group 1. Pt has successfully reached her target blood pressure. She has successfully completed the Virtual Care HTN Study.

## 2022-07-19 NOTE — Progress Notes (Signed)
[facility] Initial Assessment:    Date:  07/19/2022   ID:  Melanie Little, DOB 01-16-1971, MRN [id number]  PCP:  [name], FNP  Cardiologist:  None  Nephrologist:  Referring MD: [name], FNP   CC: Hypertension  History of Present Illness:    Melanie Little is a 51 y.o. female with a hx of hypertension, diabetes, and OSA on CPAP, here for follow up.  She was first seen 02/2022 to establish care in the [facility]. She saw [name], NP on 12/2020 and her BP was 160/120. She previously had renal artery dopplers 10/2017 which were normal. She had a CT of the Abdomen/Pelvis 01/2019 which showed no abnormal findings. Valsartan/HCTZ was increased 02/20/22.  She reported first developing hypertension in her 31s.  It became more difficult to control over the preceding 3 years.  In 01/2022 she was started on hydralazine. Then the Valsartan/HCTZ was added, and losartan was stopped.  At her initial visit she was referred to the prep program at the [facility].  She was started on amlodipine with plans to transition to [employer].  She followed up with our pharmacist 03/2022 and hydralazine was increased.  She had hypokalemia and her creatinine increased.  At follow-up she reported fatigue and was concerned about her renal function.  She had increasing edema so amlodipine was reduced.  HCTZ was discontinued and she was switched to valsartan and spironolactone.  Hydralazine was reduced to 25 mg in the morning and afternoon and continued to 50 mg in the evening.  She continued to have edema and amlodipine was discontinued.  Lately she has been feeling well from a cardiac standpoint.  She does have back pain.  She tries to walk for exercise.  She has no exertional chest pain or shortness of breath.  She continues to have some tingling in her hands and feet, mostly at night.  It seems to be better than in the past.  She has swelling in her hands after walking.  She denies any  lower extremity edema, orthopnea, or PND.  She notes that she has not had a menstrual cycle for the last 3 months and is following up with her OB/GYN about this.  At home her blood pressures have been in the 120s to 130s over 60s to 70s.  She continues to limit her sodium intake.  Previous antihypertensives: Amlodipine - Ankle/hand edema Labetalol Carvedilol spironolactone   Past Medical History:  Diagnosis Date   Abnormal EKG    Abnormal Pap smear of cervix about 2013, 2016   Colpo biopsy with normal result per pt.  colpo 2016 HPV changes   Diabetes mellitus without complication ([place])    DUB (dysfunctional uterine bleeding)    around 2012, saw gyn at the time for eval   Elevated hemoglobin A1c    Fibroid    multiple fibroids   Hypertension    Left ovarian cyst 05/2018   do yearly ultrasound.  Doing GYN ONC consulation for monitoring.    Migraine    OSA on CPAP    Resistant hypertension 05/02/2021    Past Surgical History:  Procedure Laterality Date   BREAST REDUCTION SURGERY  2005   CHOLECYSTECTOMY  2004   INCISION AND DRAINAGE Bilateral 2015   Bilateral axillary region ebaceous cyst   MASS EXCISION N/A 12/02/2020   Procedure: EXCISION OF TWO MIDLINE BACK SUBCUTANEOUS MASSES;  Surgeon: Felicie Morn, MD;  Location: [place];  Service: General;  Laterality: N/A;   REDUCTION MAMMAPLASTY Bilateral 2005    Current Medications: Current Meds  Medication Sig   amLODipine (NORVASC) 5 MG tablet Take 1 tablet (5 mg total) by mouth daily.   Blood Glucose Monitoring Suppl (ONE TOUCH ULTRA MINI) w/Device KIT 1 each by Does not apply route 3 (three) times daily as needed.   EPINEPHrine 0.3 mg/0.3 mL IJ SOAJ injection Inject 0.3 mLs into the muscle as needed. For Anaphylaxis   glucose blood test strip Use as instructed   hydrALAZINE (APRESOLINE) 25 MG tablet Take 25 mg by mouth 2 (two) times daily.   hydrALAZINE (APRESOLINE) 50 MG tablet Take 50 mg by mouth  daily.   Lancets (ONETOUCH ULTRASOFT) lancets Use as instructed   nebivolol (BYSTOLIC) 10 MG tablet Take 1 tablet (10 mg total) by mouth daily.   norethindrone (NORLYDA) 0.35 MG tablet TAKE 1 TABLET(0.35 MG) BY MOUTH DAILY   Semaglutide, 2 MG/DOSE, (OZEMPIC, 2 MG/DOSE,) 8 MG/3ML SOPN Inject 2 mg into the skin once a week.   spironolactone (ALDACTONE) 25 MG tablet Take 1 tablet (25 mg total) by mouth daily.   valsartan (DIOVAN) 160 MG tablet Take 1 tablet (160 mg total) by mouth daily.     Allergies:   Patient has no active allergies.   Social History   Socioeconomic History   Marital status: Married    Spouse name: Not on file   Number of children: 1   Years of education: Not on file   Highest education level: Not on file  Occupational History   Occupation: [employer]  Tobacco Use   Smoking status: Never   Smokeless tobacco: Never  Vaping Use   Vaping Use: Never used  Substance and Sexual Activity   Alcohol use: No    Alcohol/week: 0.0 standard drinks of alcohol   Drug use: No   Sexual activity: Yes    Birth control/protection: Pill    Comment: Micronor  Other Topics Concern   Not on file  Social History Narrative   Work or School: Education officer, 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[employer]      Home Situation: lives with husband      Spiritual Beliefs: Baptist      Lifestyle: walks every other day; diet is bad      Social Determinants of Health   Financial Resource Strain: Low Risk  (03/01/2022)   Overall Financial Resource Strain (CARDIA)    Difficulty of Paying Living Expenses: Not hard at all  Food Insecurity: No Food Insecurity (03/01/2022)   Hunger Vital Sign    Worried About Running Out of Food in the Last Year: Never true    [place] in the Last Year: Never true  Transportation Needs: No Transportation Needs (03/01/2022)   PRAPARE - Hydrologist (Medical): No    Lack of Transportation (Non-Medical): No   Physical Activity: Inactive (03/01/2022)   Exercise Vital Sign    Days of Exercise per Week: 0 days    Minutes of Exercise per Session: 0 min  Stress: Not on file  Social Connections: Not on file     Family History: The patient's family history includes Breast cancer in her maternal aunt and maternal aunt; CVA (age of onset: 80) in her mother; Deep vein thrombosis in her father; Diabetes in her father.  ROS:   Please see the history of present illness.    (+) Aching pain in chest (+) Shortness of breath (+)  Headaches (+) Fatigue All other systems reviewed and are negative.  EKGs/Labs/Other Studies Reviewed:    Lexiscan Myoview 01/31/2021: Nuclear stress EF: 50%. Blood pressure demonstrated a hypertensive response to exercise. ST segment elevation was noted during stress in the aVR leads. The study is normal. This is a low risk study. The left ventricular ejection fraction is mildly decreased (45-54%).   Very elevated blood pressure, initially 207/110 and then 235/117 after lexiscan. Concerning ECG changes with aVR elevation and horizontal ST depressions as noted. However, normal perfusion without evidence of ischemia. The EF calculates as low normal but visually appears normal.  Echo 01/31/2021:  1. Left ventricular ejection fraction, by estimation, is 60 to 65%. The  left ventricle has normal function. The left ventricle has no regional  wall motion abnormalities. There is moderate left ventricular hypertrophy.  Left ventricular diastolic  parameters were normal. The average left ventricular global longitudinal  strain is -18.9 %. The global longitudinal strain is normal.   2. Right ventricular systolic function is normal. The right ventricular  size is normal. There is normal pulmonary artery systolic pressure. The  estimated right ventricular systolic pressure is 17.7 mmHg.   3. The mitral valve is normal in structure. No evidence of mitral valve  regurgitation.   4. The  aortic valve is tricuspid. Aortic valve regurgitation is not  visualized.   5. Aortic dilatation noted. There is borderline dilatation of the  ascending aorta, measuring 38 mm.   6. The inferior vena cava is normal in size with <50% respiratory  variability, suggesting right atrial pressure of 8 mmHg.   Comparison(s): Prior images unable to be directly viewed, comparison made by report only. Changes from prior study are noted. 11/04/2017: LVEF 60% with moderate to severe LVH.   CT Abdomen/Pelvis 02/26/2019: FINDINGS: Lower Chest: No acute findings.   Hepatobiliary: No hepatic masses identified. Mild diffuse hepatic steatosis noted. Prior cholecystectomy. No evidence of biliary obstruction.   Pancreas:  No mass or inflammatory changes.   Spleen: Within normal limits in size and appearance.   Adrenals/Urinary Tract: No masses identified. No evidence of hydronephrosis.   Stomach/Bowel: No evidence of obstruction, inflammatory process or abnormal fluid collections. Normal appendix visualized.   Vascular/Lymphatic: No pathologically enlarged lymph nodes. No abdominal aortic aneurysm.   Reproductive: Multiple uterine fibroids are seen, largest measuring approximately 3.5 cm. A cystic lesion with a few thin septations and somewhat tubular appearance is seen in the left adnexa which measures 5.0 x 3.1 x 4.2 cm. This is similar in size in appearance compared to previous ultrasound. Differential diagnosis includes cystic ovarian neoplasm and hydrosalpinx. No definite features of malignancy identified. No evidence of ascites.   Other:  None.   Musculoskeletal:  No suspicious bone lesions identified.   IMPRESSION: 1. 5 cm cystic lesion in left adnexa with thin septations and somewhat tubular appearance. Differential diagnosis includes cystic ovarian neoplasm and hydrosalpinx. Consider pelvic MRI without and with contrast for further characterization. 2. Multiple uterine fibroids,  largest measuring 3.5 cm. 3. Mild hepatic steatosis.  Bilateral Renal Artery Dopplers 11/15/2017: FINAL INTERPRETATION     Largest Aortic Diameter     1.4 cm     Renal  No evidence of renal artery stenosis, bilaterally.     Tortuous renal arteries, bilaterally.  Normal and symmetrical kidney size.  Normal cortical thickness.  No evidence of SMA or celiac artery stenosis.  Patent renal veins and IVC.   EKG:  EKG is personally reviewed. 03/01/2022: EKG was  not ordered.   Recent Labs: 01/16/2022: ALT 17 05/14/2022: BUN 9; Creatinine, Ser 0.88; Potassium 4.4; Sodium 137   Recent Lipid Panel    Component Value Date/Time   CHOL 179 04/16/2022 1640   TRIG 221 (H) 04/16/2022 1640   HDL 51 04/16/2022 1640   CHOLHDL 3.5 04/16/2022 1640   CHOLHDL 3.8 01/29/2020 0821   VLDL 30.4 12/03/2016 1233   LDLCALC 91 04/16/2022 1640   LDLCALC 105 (H) 01/29/2020 0821    Physical Exam:    VS:  BP 124/86 (BP Location: Left Arm, Patient Position: Sitting, Cuff Size: Large)   Pulse 68   Ht '5\' 4"'  (1.626 m)   Wt 189 lb 4.8 oz (85.9 kg)   SpO2 95%   BMI 32.49 kg/m  , BMI Body mass index is 32.49 kg/m. GENERAL:  Well appearing HEENT: Pupils equal round and reactive, fundi not visualized, oral mucosa unremarkable NECK:  No jugular venous distention, waveform within normal limits, carotid upstroke brisk and symmetric, no bruits, no thyromegaly LUNGS:  Clear to auscultation bilaterally HEART:  RRR.  PMI not displaced or sustained,S1 and S2 within normal limits, no S3, no S4, no clicks, no rubs, no murmurs ABD:  Flat, positive bowel sounds normal in frequency in pitch, no bruits, no rebound, no guarding, no midline pulsatile mass, no hepatomegaly, no splenomegaly EXT:  2 plus pulses throughout, no edema, no cyanosis no clubbing SKIN:  No rashes no nodules NEURO:  Cranial nerves II through XII grossly intact, motor grossly intact throughout PSYCH:  Cognitively intact, oriented to person place and  time  ASSESSMENT/PLAN:    Resistant hypertension Blood pressures have been better controlled.  She will keep working on diet and exercise.  She is struggled with side effects to medications.  Continue amlodipine, hydralazine, nebivolol, spironolactone, and valsartan.  We discussed the fact that she would not qualify for renal denervation under investigational status now given that her blood pressures are pretty well controlled.  However would consider the procedure after FDA approval so that she can potentially get off of some of her medicines.  OSA (obstructive sleep apnea) Continue CPAP.    Screening for Secondary Hypertension:     03/01/2022    9:07 AM  Causes  Drugs/Herbals Screened     - Comments High sodium, rare EtOH, no caffeine, rare NSAIDs  Renovascular HTN Screened     - Comments Renal Doppler negative 2018, CT-A abd negative 2020  Sleep Apnea Screened     - Comments uses CPAP faithfully  Thyroid Disease Screened  Hyperaldosteronism Screened  Pheochromocytoma N/A  Cushing's Syndrome N/A  Hyperparathyroidism Screened  Coarctation of the Aorta Screened     - Comments BP symmetric  Compliance Screened    Relevant Labs/Studies:    Latest Ref Rng & Units 05/14/2022    2:09 PM 04/16/2022    4:40 PM 04/04/2022    8:41 AM  Basic Labs  Sodium 134 - 144 mmol/L 137  141  140   Potassium 3.5 - 5.2 mmol/L 4.4  3.4  3.4   Creatinine 0.57 - 1.00 mg/dL 0.88  1.04  1.02        Latest Ref Rng & Units 11/09/2020    3:14 PM 05/14/2018   11:56 AM  Thyroid   TSH mIU/L 2.03  2.80                 11/15/2017   11:52 AM  Renovascular   Renal Artery [place] Completed Yes  Disposition:    FU with Kinley Ferrentino C. Oval Linsey, MD, [facility] in 6 months.   Medication Adjustments/Labs and Tests Ordered: Current medicines are reviewed at length with the patient today.  Concerns regarding medicines are outlined above.   No orders of the defined types were placed in this encounter.  No orders  of the defined types were placed in this encounter.    Signed, Skeet Latch, MD  07/19/2022 10:07 AM    [place]

## 2022-07-19 NOTE — Assessment & Plan Note (Signed)
Continue CPAP.  

## 2022-07-19 NOTE — Patient Instructions (Signed)
Medication Instructions:  Your physician recommends that you continue on your current medications as directed. Please refer to the Current Medication list given to you today.  *If you need a refill on your cardiac medications before your next appointment, please call your pharmacy*  Lab Work: none  Testing/Procedures: none  Follow-Up: At [facility], you and your health needs are our priority.  As part of our continuing mission to provide you with exceptional heart care, we have created designated Provider Care Teams.  These Care Teams include your primary Cardiologist (physician) and Advanced Practice Providers (APPs -  Physician Assistants and Nurse Practitioners) who all work together to provide you with the care you need, when you need it.  We recommend signing up for the patient portal called "MyChart".  Sign up information is provided on this After Visit Summary.  MyChart is used to connect with patients for Virtual Visits (Telemedicine).  Patients are able to view lab/test results, encounter notes, upcoming appointments, etc.  Non-urgent messages can be sent to your provider as well.   To learn more about what you can do with MyChart, go to [email or web address].    Your next appointment:   6 month(s)  The format for your next appointment:   In Person  Provider:   Skeet Latch, MD

## 2022-07-25 ENCOUNTER — Other Ambulatory Visit: Payer: Self-pay

## 2022-07-25 MED ORDER — HYDRALAZINE HCL 25 MG PO TABS: 25.0000 mg | ORAL_TABLET | Freq: Two times a day (BID) | ORAL | 1 refills | Status: DC

## 2022-08-01 ENCOUNTER — Encounter: Payer: Self-pay | Admitting: *Deleted

## 2022-08-02 ENCOUNTER — Encounter: Payer: Self-pay | Admitting: Adult Health

## 2022-08-02 ENCOUNTER — Ambulatory Visit: Payer: Managed Care, Other (non HMO) | Admitting: Adult Health

## 2022-08-02 VITALS — BP 124/78 | HR 75 | Ht 64.0 in | Wt 188.0 lb

## 2022-08-02 DIAGNOSIS — Z9989 Dependence on other enabling machines and devices: Secondary | ICD-10-CM

## 2022-08-02 DIAGNOSIS — G4733 Obstructive sleep apnea (adult) (pediatric): Secondary | ICD-10-CM | POA: Diagnosis not present

## 2022-08-02 NOTE — Patient Instructions (Addendum)
Continue using CPAP nightly and greater than 4 hours each night Home sleep test ordered If your symptoms worsen or you develop new symptoms please let us know.

## 2022-08-02 NOTE — Progress Notes (Signed)
PATIENT: Melanie Little DOB: [birth year]  REASON FOR VISIT: follow up HISTORY FROM: patient Primary neurologist: Dr. Brett Fairy  Chief Complaint  Patient presents with   Follow-up    Pt in 5  pt here  for CPAP follow up  Pt has questions about a new CPAP machine      HISTORY OF PRESENT ILLNESS: Today 08/02/22:Melanie Little is a 51 year old female with a history of obstructive sleep apnea on CPAP.  She returns today for follow-up. Denies any new issues. DL is below. Would like a new machine    08/02/21: Melanie Little is a 51 year old female with a history of obstructive sleep apnea on CPAP.  She returns today for follow-up.  Her download indicates good compliance.  She denies any new issues.  Reports that the CPAP is working well for her.  She returns today for follow-up.    08/01/20: Melanie Little is a 51 year old female with a history of obstructive sleep apnea on CPAP.  Her download indicates that she use her machine nightly for compliance of 100%.  She is averaging greater than 4 hours each night.  On average she uses her machine 7 hours and 39 minutes.  Her residual AHI is 1.9 on 6 cm of water with EPR of 2.  Leak in the 95th percentile is 22.6 L/min.  She reports that the CPAP is working well.  She returns today for an evaluation.  HISTORY (Copied from Dr.Dohmeier's note) -28-2020, patient reports stiff neck, no stuffy nose and  Good compliance with CPAP.  I have the pleasure of meeting today with Melanie Little, a 51 year old left handed AAF with OSA on CPAP. She endorsed the Epworth sleepiness score at 1 out of 24 possible points, fatigue severity at 20.  Her compliance to CPAP is excellent she has used the CPAP every of the last 30 days, with an average of 8 hours and 3 minutes set pressure is 6 cmH2O was 2 cm EPR, but the residual AHI has crept up to 5.5 which is higher than before.  She does have a significant amount of air leakage and apneas are obstructive in nature.  There are some unknown  type apneas that I attributed to air leak.  However with the excellent compliance and the low Epworth Sleepiness Scale I think that we do not necessarily have to make changes unless the patient would like to try a different mask or interface. Her machine was issued October 2016.  She has a history of HTN uncontrolled on 4 medications and was evaluated for various causes. No renal artery stenosis. Now on one medication and doing better.     REVIEW OF SYSTEMS: Out of a complete 14 system review of symptoms, the patient complains only of the following symptoms, and all other reviewed systems are negative.  Epworth sleepiness score2 FSS 15   ALLERGIES: No Active Allergies  HOME MEDICATIONS: Outpatient Medications Prior to Visit  Medication Sig Dispense Refill   amLODipine (NORVASC) 5 MG tablet Take 1 tablet (5 mg total) by mouth daily. 90 tablet 3   Blood Glucose Monitoring Suppl (ONE TOUCH ULTRA MINI) w/Device KIT 1 each by Does not apply route 3 (three) times daily as needed. 1 kit 1   EPINEPHrine 0.3 mg/0.3 mL IJ SOAJ injection Inject 0.3 mLs into the muscle as needed. For Anaphylaxis     glucose blood test strip Use as instructed 100 each 12   hydrALAZINE (APRESOLINE) 25 MG tablet Take 1 tablet (25 mg total)  by mouth 2 (two) times daily. 180 tablet 1   hydrALAZINE (APRESOLINE) 50 MG tablet Take 50 mg by mouth daily.     Lancets (ONETOUCH ULTRASOFT) lancets Use as instructed 100 each 12   nebivolol (BYSTOLIC) 10 MG tablet Take 1 tablet (10 mg total) by mouth daily. 30 tablet 5   norethindrone (NORLYDA) 0.35 MG tablet TAKE 1 TABLET(0.35 MG) BY MOUTH DAILY 84 tablet 3   Semaglutide, 2 MG/DOSE, (OZEMPIC, 2 MG/DOSE,) 8 MG/3ML SOPN Inject 2 mg into the skin once a week. 3 mL 5   spironolactone (ALDACTONE) 25 MG tablet Take 1 tablet (25 mg total) by mouth daily. 90 tablet 3   valsartan (DIOVAN) 160 MG tablet Take 1 tablet (160 mg total) by mouth daily. 90 tablet 3   No facility-administered  medications prior to visit.    PAST MEDICAL HISTORY: Past Medical History:  Diagnosis Date   Abnormal EKG    Abnormal Pap smear of cervix about 2013, 2016   Colpo biopsy with normal result per pt.  colpo 2016 HPV changes   Diabetes mellitus without complication ([place])    DUB (dysfunctional uterine bleeding)    around 2012, saw gyn at the time for eval   Elevated hemoglobin A1c    Fibroid    multiple fibroids   Hypertension    Left ovarian cyst 05/2018   do yearly ultrasound.  Doing GYN ONC consulation for monitoring.    Migraine    OSA on CPAP    Resistant hypertension 05/02/2021    PAST SURGICAL HISTORY: Past Surgical History:  Procedure Laterality Date   BREAST REDUCTION SURGERY  2005   CHOLECYSTECTOMY  2004   INCISION AND DRAINAGE Bilateral 2015   Bilateral axillary region ebaceous cyst   MASS EXCISION N/A 12/02/2020   Procedure: EXCISION OF TWO MIDLINE BACK SUBCUTANEOUS MASSES;  Surgeon: Felicie Morn, MD;  Location: [facility];  Service: General;  Laterality: N/A;   REDUCTION MAMMAPLASTY Bilateral 2005    FAMILY HISTORY: Family History  Problem Relation Age of Onset   CVA Mother 36       deceased, was 52 at the age of her first stroke   Diabetes Father    Deep vein thrombosis Father    Breast cancer Maternal Aunt    Breast cancer Maternal Aunt     SOCIAL HISTORY: Social History   Socioeconomic History   Marital status: Married    Spouse name: Not on file   Number of children: 1   Years of education: Not on file   Highest education level: Not on file  Occupational History   Occupation: [employer]  Tobacco Use   Smoking status: Never   Smokeless tobacco: Never  Vaping Use   Vaping Use: Never used  Substance and Sexual Activity   Alcohol use: No    Alcohol/week: 0.0 standard drinks of alcohol   Drug use: No   Sexual activity: Yes    Birth control/protection: Pill    Comment: Micronor  Other Topics Concern    Not on file  Social History Narrative   Work or School: Education officer, 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[employer]      Home Situation: lives with husband      Spiritual Beliefs: Baptist      Lifestyle: walks every other day; diet is bad      Social Determinants of Health   Financial Resource Strain: Low Risk  (03/01/2022)   Overall Emergency planning/management officer Strain (  CARDIA)    Difficulty of Paying Living Expenses: Not hard at all  Food Insecurity: No Food Insecurity (03/01/2022)   Hunger Vital Sign    Worried About Running Out of Food in the Last Year: Never true    Ran Out of Food in the Last Year: Never true  Transportation Needs: No Transportation Needs (03/01/2022)   PRAPARE - Hydrologist (Medical): No    Lack of Transportation (Non-Medical): No  Physical Activity: Inactive (03/01/2022)   Exercise Vital Sign    Days of Exercise per Week: 0 days    Minutes of Exercise per Session: 0 min  Stress: Not on file  Social Connections: Not on file  Intimate Partner Violence: Not on file      PHYSICAL EXAM  Vitals:   08/02/22 0755  BP: 124/78  Pulse: 75  Weight: 188 lb (85.3 kg)  Height: '5\' 4"'  (1.626 m)    Body mass index is 32.27 kg/m.  Generalized: Well developed, in no acute distress  Chest: Lungs clear to auscultation bilaterally  Neurological examination  Mentation: Alert oriented to time, place, history taking. Follows all commands speech and language fluent Cranial nerve II-XII: Extraocular movements were full, visual field were full on confrontational test Head turning and shoulder shrug  were normal and symmetric. Gait and station: Gait is normal.    DIAGNOSTIC DATA (LABS, IMAGING, TESTING) - I reviewed patient records, labs, notes, testing and imaging myself where available.  Lab Results  Component Value Date   WBC 8.2 11/09/2020   HGB 13.4 11/09/2020   HCT 42.4 11/09/2020   MCV 70.5 (L) 11/09/2020   PLT 475 (H) 11/09/2020       Component Value Date/Time   NA 137 05/14/2022 1409   K 4.4 05/14/2022 1409   CL 103 05/14/2022 1409   CO2 21 05/14/2022 1409   GLUCOSE 90 05/14/2022 1409   GLUCOSE 129 (H) 11/30/2020 0945   BUN 9 05/14/2022 1409   CREATININE 0.88 05/14/2022 1409   CREATININE 0.70 11/09/2020 1514   CALCIUM 9.0 05/14/2022 1409   PROT 7.1 01/16/2022 1702   ALBUMIN 4.7 01/16/2022 1702   AST 12 01/16/2022 1702   ALT 17 01/16/2022 1702   ALKPHOS 80 01/16/2022 1702   BILITOT 0.9 01/16/2022 1702   GFRNONAA >60 11/30/2020 0945   GFRNONAA >89 04/30/2017 1529   GFRAA >60 02/25/2019 1420   GFRAA >89 04/30/2017 1529   Lab Results  Component Value Date   CHOL 179 04/16/2022   HDL 51 04/16/2022   LDLCALC 91 04/16/2022   TRIG 221 (H) 04/16/2022   CHOLHDL 3.5 04/16/2022   Lab Results  Component Value Date   HGBA1C 6.5 (H) 04/16/2022   Lab Results  Component Value Date   VITAMINB12 506 04/16/2022   Lab Results  Component Value Date   TSH 2.03 11/09/2020      ASSESSMENT AND PLAN 51 y.o. year old female  has a past medical history of Abnormal EKG, Abnormal Pap smear of cervix (about 2013, 2016), Diabetes mellitus without complication ([place]), DUB (dysfunctional uterine bleeding), Elevated hemoglobin A1c, Fibroid, Hypertension, Left ovarian cyst (05/2018), Migraine, OSA on CPAP, and Resistant hypertension (05/02/2021). here with:  Obstructive sleep apnea on CPAP  CPAP compliance excellent Residual AHI <5 Encourage patient continue using CPAP nightly and greater than 4 hours each night Home sleep test ordered. Will order new machine pending results If your symptoms worsen or you develop new symptoms please let us know.  FU 1 year or sooner if needed  Ward Givens, MSN, NP-C 08/02/2022, 7:44 AM [facility] [address] [place], [place] [address] [phone]

## 2022-08-25 ENCOUNTER — Encounter: Payer: Self-pay | Admitting: Adult Health

## 2022-08-27 ENCOUNTER — Encounter: Payer: Self-pay | Admitting: Adult Health

## 2022-08-27 ENCOUNTER — Encounter (HOSPITAL_BASED_OUTPATIENT_CLINIC_OR_DEPARTMENT_OTHER): Payer: Self-pay | Admitting: Cardiovascular Disease

## 2022-08-27 NOTE — Telephone Encounter (Signed)
Please advise 

## 2022-08-27 NOTE — Telephone Encounter (Signed)
Duplicate message. 

## 2022-08-30 NOTE — Telephone Encounter (Signed)
HST- [employer] no auth req spoke to [facility] ref # [id number].  Patient is scheduled at [facility] For 09/10/22 at 3:30 pm.  Mailed and mychart message the patient the packet info.

## 2022-09-07 ENCOUNTER — Other Ambulatory Visit: Payer: Self-pay | Admitting: Obstetrics and Gynecology

## 2022-09-07 DIAGNOSIS — D259 Leiomyoma of uterus, unspecified: Secondary | ICD-10-CM

## 2022-09-10 ENCOUNTER — Ambulatory Visit: Payer: Managed Care, Other (non HMO) | Admitting: Neurology

## 2022-09-10 DIAGNOSIS — G4733 Obstructive sleep apnea (adult) (pediatric): Secondary | ICD-10-CM | POA: Diagnosis not present

## 2022-09-12 NOTE — Progress Notes (Signed)
[facility]. Melanie Little   HOME SLEEP TEST REPORT ( by Watch PAT)   STUDY DATE: 09-12-2022    ORDERING CLINICIAN: Ward Givens, NP REFERRING CLINICIAN: Larey Seat, MD    CLINICAL INFORMATION/HISTORY: Today 08/02/22:Melanie Little is a 51 year old female with a history of obstructive sleep apnea on CPAP.  She returns today for follow-up. Denies any new issues. DL is below. Would like a new machine. 08/02/21: Melanie Little is a 51 year old female with a history of obstructive sleep apnea on CPAP.  She returns today for follow-up.  Her download indicates good compliance.  She denies any new issues.  Reports that the CPAP is working well for her.  She returns today for follow-up.   CPAP set up date:  CPAP used at 8 cm H20, 2cm EPR and AHI 1.1/h.      Epworth sleepiness score: 2 /24.on CPAP   BMI: 32 kg/m   Neck Circumference: NA   FINDINGS:   Sleep Summary:   Total Recording Time (hours, min): 9 hours and 18 minutes       Total Sleep Time (hours, min):    7 hours 28 minutes             Percent REM (%): 21.3%                                      Respiratory Indices:   Calculated pAHI (per hour):    47.1/h                         REM pAHI: 71.4/h                                               NREM pAHI:   40.4/h                           Positional AHI:     No data  Snoring: No data                                              Oxygen Saturation Statistics:        O2 Saturation Range (%):   Between a nadir of 84 and a maximum saturation at 99% with a mean saturation of 92%.                                    O2 Saturation (minutes) <89%: 3 minutes         Pulse Rate Statistics:               Pulse Range:    The mean heart rate was 76 bpm, heart rate varied between 58 and 110 bpm             IMPRESSION:  This HST confirms the presence of severe sleep apnea with REM sleep accentuation, frequent brief oxygen desaturations, and overall reduction of sleep  time.   RECOMMENDATION: The patient will continue CPAP therapy with an interface of her  choice, an auto titration CPAP device will be provided with a setting between 5 and 12 cm water pressure, 2 cm expiratory pressure relief, and heated humidification.    INTERPRETING PHYSICIAN:   Larey Seat, MD   Medical Director of [facility] at [facility].

## 2022-09-14 ENCOUNTER — Ambulatory Visit
Admission: RE | Admit: 2022-09-14 | Discharge: 2022-09-14 | Disposition: A | Discharge status: Home / Self Care | Payer: Managed Care, Other (non HMO) | Source: Ambulatory Visit | Attending: Obstetrics and Gynecology | Admitting: Obstetrics and Gynecology

## 2022-09-14 DIAGNOSIS — D259 Leiomyoma of uterus, unspecified: Secondary | ICD-10-CM

## 2022-09-14 HISTORY — PX: IR RADIOLOGIST EVAL & MGMT: IMG5224

## 2022-09-14 NOTE — Consult Note (Signed)
Chief Complaint: Patient was seen in consultation today for symptomatic uterine fibroids  at the request of Sherlyn Hay  Referring Physician(s): Banga,Cecilia Worema  History of Present Illness: Melanie Little is a 51 y.o. female with history of uterine fibroids and heavy menstrual bleeding.  Patient has been diagnosed with uterine fibroids based on prior imaging.  She reports heavy menstrual bleeding for the last 4 years.  Typically her menstrual periods are regular with 4 days of very heavy bleeding and 2 days of spotting.  The 4 days of heavy bleeding are so severe that can be difficult for her to leave the house and work.  She denies interperiod bleeding.  Recently she went 3 months without a cycle but now she has restarted.  She was worked up for menopause and she was told that she is not menopausal at this time.  Pregnancy history is G3, P1.  Patient has no desire for additional pregnancies.  Patient does not complain of dyspareunia.  Last Pap smear was on 09/25/2019 and negative for intraepithelial lesion or malignancy.  Patient reports remote history of pelvic bacterial infections but nothing recently.  Past medical history is significant for poorly controlled hypertension.  Patient is being followed by cardiology.  Patient has prediabetes.  Patient denies urinary symptoms or bowel symptoms.  She does have cramping associated with her menstrual bleeding.  Past Medical History:  Diagnosis Date   Abnormal EKG    Abnormal Pap smear of cervix about 2013, 2016   Colpo biopsy with normal result per pt.  colpo 2016 HPV changes   Diabetes mellitus without complication ([place])    DUB (dysfunctional uterine bleeding)    around 2012, saw gyn at the time for eval   Elevated hemoglobin A1c    Fibroid    multiple fibroids   Hypertension    Left ovarian cyst 05/2018   do yearly ultrasound.  Doing GYN ONC consulation for monitoring.    Migraine    OSA on CPAP    Resistant  hypertension 05/02/2021    Past Surgical History:  Procedure Laterality Date   BREAST REDUCTION SURGERY  2005   CHOLECYSTECTOMY  2004   INCISION AND DRAINAGE Bilateral 2015   Bilateral axillary region ebaceous cyst   MASS EXCISION N/A 12/02/2020   Procedure: EXCISION OF TWO MIDLINE BACK SUBCUTANEOUS MASSES;  Surgeon: Felicie Morn, MD;  Location: [facility];  Service: General;  Laterality: N/A;   REDUCTION MAMMAPLASTY Bilateral 2005    Allergies: Patient has no active allergies.  Medications: Prior to Admission medications   Medication Sig Start Date End Date Taking? Authorizing Provider  amLODipine (NORVASC) 5 MG tablet Take 1 tablet (5 mg total) by mouth daily. 03/01/22   Skeet Latch, MD  Blood Glucose Monitoring Suppl (ONE TOUCH ULTRA MINI) w/Device KIT 1 each by Does not apply route 3 (three) times daily as needed. 05/11/21   Brimage, Ronnette Juniper, DO  EPINEPHrine 0.3 mg/0.3 mL IJ SOAJ injection Inject 0.3 mLs into the muscle as needed. For Anaphylaxis 11/17/21   [provider]  glucose blood test strip Use as instructed 05/15/21   Lyndee Hensen, DO  hydrALAZINE (APRESOLINE) 25 MG tablet Take 1 tablet (25 mg total) by mouth 2 (two) times daily. 07/25/22   [name], FNP  hydrALAZINE (APRESOLINE) 50 MG tablet Take 50 mg by mouth daily.    [provider]  Lancets Glory Rosebush ULTRASOFT) lancets Use as instructed 05/15/21   Lyndee Hensen, DO  nebivolol (  BYSTOLIC) 10 MG tablet Take 1 tablet (10 mg total) by mouth daily. 01/16/22   [name], FNP  norethindrone (NORLYDA) 0.35 MG tablet TAKE 1 TABLET(0.35 MG) BY MOUTH DAILY 01/16/22   [name], FNP  Semaglutide, 2 MG/DOSE, (OZEMPIC, 2 MG/DOSE,) 8 MG/3ML SOPN Inject 2 mg into the skin once a week. 06/26/22   [name], FNP  spironolactone (ALDACTONE) 25 MG tablet Take 1 tablet (25 mg total) by mouth daily. 05/11/22   Skeet Latch, MD  valsartan (DIOVAN) 160 MG tablet Take 1 tablet (160  mg total) by mouth daily. 05/02/22   Skeet Latch, MD     Family History  Problem Relation Age of Onset   CVA Mother 67       deceased, was 71 at the age of her first stroke   Diabetes Father    Deep vein thrombosis Father    Breast cancer Maternal Aunt    Breast cancer Maternal Aunt    Sleep apnea Neg Hx     Social History   Socioeconomic History   Marital status: Married    Spouse name: Not on file   Number of children: 1   Years of education: Not on file   Highest education level: Not on file  Occupational History   Occupation: [employer]  Tobacco Use   Smoking status: Never   Smokeless tobacco: Never  Vaping Use   Vaping Use: Never used  Substance and Sexual Activity   Alcohol use: No    Alcohol/week: 0.0 standard drinks of alcohol   Drug use: No   Sexual activity: Yes    Birth control/protection: Pill    Comment: Micronor  Other Topics Concern   Not on file  Social History Narrative   Work or School: Education officer, 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[employer]      Home Situation: lives with husband      Spiritual Beliefs: Baptist      Lifestyle: walks every other day; diet is bad      Social Determinants of Health   Financial Resource Strain: Low Risk  (03/01/2022)   Overall Financial Resource Strain (CARDIA)    Difficulty of Paying Living Expenses: Not hard at all  Food Insecurity: No Food Insecurity (03/01/2022)   Hunger Vital Sign    Worried About Running Out of Food in the Last Year: Never true    [place] in the Last Year: Never true  Transportation Needs: No Transportation Needs (03/01/2022)   PRAPARE - Hydrologist (Medical): No    Lack of Transportation (Non-Medical): No  Physical Activity: Inactive (03/01/2022)   Exercise Vital Sign    Days of Exercise per Week: 0 days    Minutes of Exercise per Session: 0 min  Stress: Not on file  Social Connections: Not on file    ECOG Status: 1 -  Symptomatic but completely ambulatory  Review of Systems  Constitutional: Negative.   Respiratory: Negative.    Cardiovascular: Negative.   Gastrointestinal: Negative.  Negative for abdominal pain.  Genitourinary:  Positive for menstrual problem and pelvic pain. Negative for difficulty urinating.    Vital Signs: There were no vitals taken for this visit.    Physical Exam Constitutional:      Appearance: Normal appearance.  Cardiovascular:     Rate and Rhythm: Normal rate and regular rhythm.     Heart sounds: Normal heart sounds.     Comments: Difficult to palpate  pedal pulses. Pulmonary:     Effort: Pulmonary effort is normal.     Breath sounds: Normal breath sounds.  Abdominal:     General: There is distension.     Palpations: Abdomen is soft.     Tenderness: There is abdominal tenderness.     Comments: Tenderness and fullness in the midline of the upper pelvis.  Musculoskeletal:        General: No swelling.     Right lower leg: No edema.     Left lower leg: No edema.  Neurological:     Mental Status: She is alert.        Imaging: Reviewed a CT of the abdomen and pelvis from 02/26/2019.  Patient has multiple uterine fibroids on this examination.  Ultrasound report from 08/23/2022 demonstrates anteverted enlarged fibroid uterus.  Normal left and right ovaries.  Question a left hydrosalpinx.  Labs:  08/17/2022 -LH 4.1, FSH 9.2, estradiol 29  BMP: Recent Labs    01/16/22 1702 04/04/22 0841 04/16/22 1640 05/14/22 1409  NA 141 140 141 137  K 3.6 3.4* 3.4* 4.4  CL 105 98 99 103  CO2 '23 24 24 21  ' GLUCOSE 83 108* 106* 90  BUN '7 14 10 9  ' CALCIUM 8.9 9.6 10.2 9.0  CREATININE 0.72 1.02* 1.04* 0.88    LIVER FUNCTION TESTS: Recent Labs    01/16/22 1702  BILITOT 0.9  AST 12  ALT 17  ALKPHOS 80  PROT 7.1  ALBUMIN 4.7    TUMOR MARKERS: No results for input(s): "AFPTM", "CEA", "CA199", "CHROMGRNA" in the last 8760 hours.  Assessment and  Plan:  51 year old female with uterine fibroids and menorrhagia.  Heavy menstrual bleeding has been going on for approximately 4 years and her heavy bleeding can be lifestyle limiting making it difficult for her to work and leave the house.  Patient clearly has evidence of uterine fibroids based prior imaging.  Past medical history is significant for poorly controlled hypertension.  Patient has no interest in future pregnancies and we discussed the treatment options including hysterectomy and uterine artery embolization.  We had a thorough discussion about uterine artery embolization including the procedure itself which includes moderate sedation and arterial access from either the groin or left radial artery approach.  We discussed the risk of the procedure including bleeding, infection and vascular injury.  We discussed the expectations after the embolization procedure which include lighter menstrual periods.  We discussed postembolization syndrome requiring overnight hospitalization and being out of work for approximately 2 weeks.  I believe the patient has a very good understanding of the procedure and she would like to pursue a pelvic MRI to see if she is a candidate for uterine artery embolization.  We will schedule the patient for a pelvic MRI, with and without contrast.  Assuming that there is no contraindication based on MRI, the patient would be a good candidate for uterine artery embolization.  We will contact the patient after the MRI results.  Thank you for this interesting consult.  I greatly enjoyed meeting [facility] and look forward to participating in their care.  A copy of this report was sent to the requesting provider on this date.  Electronically Signed: Burman Riis 09/14/2022, 3:10 PM   I spent a total of  30 Minutes   in face to face in clinical consultation, greater than 50% of which was counseling/coordinating care for symptomatic uterine fibroids.

## 2022-09-17 ENCOUNTER — Other Ambulatory Visit: Payer: Self-pay | Admitting: Obstetrics and Gynecology

## 2022-09-17 DIAGNOSIS — D259 Leiomyoma of uterus, unspecified: Secondary | ICD-10-CM

## 2022-09-18 ENCOUNTER — Telehealth: Payer: Self-pay | Admitting: Neurology

## 2022-09-18 NOTE — Procedures (Signed)
[facility] [place]   HOME SLEEP TEST REPORT ( by Watch PAT)   STUDY DATE: 09-12-2022    ORDERING CLINICIAN: Ward Givens, NP REFERRING CLINICIAN: Larey Seat, MD    CLINICAL INFORMATION/HISTORY: Today 08/02/22:Melanie Little is a 51 year old female with a history of obstructive sleep apnea on CPAP.  She returns today for follow-up. Denies any new issues. DL is below. Would like a new machine. 08/02/21: Melanie Little is a 51 year old female with a history of obstructive sleep apnea on CPAP.  She returns today for follow-up.  Her download indicates good compliance.  She denies any new issues.  Reports that the CPAP is working well for her.  She returns today for follow-up.   CPAP set up date:  CPAP used at 8 cm H20, 2cm EPR and AHI 1.1/h.      Epworth sleepiness score: 2 /24.on CPAP   BMI: 32 kg/m   Neck Circumference: NA   FINDINGS:   Sleep Summary:   Total Recording Time (hours, min): 9 hours and 18 minutes       Total Sleep Time (hours, min):    7 hours 28 minutes             Percent REM (%): 21.3%                                      Respiratory Indices:   Calculated pAHI (per hour):    47.1/h                         REM pAHI: 71.4/h                                               NREM pAHI:   40.4/h                           Positional AHI:     No data  Snoring: No data                                              Oxygen Saturation Statistics:        O2 Saturation Range (%):   Between a nadir of 84 and a maximum saturation at 99% with a mean saturation of 92%.                                    O2 Saturation (minutes) <89%: 3 minutes         Pulse Rate Statistics:               Pulse Range:    The mean heart rate was 76 bpm, heart rate varied between 58 and 110 bpm             IMPRESSION:  This HST confirms the presence of severe sleep apnea with REM sleep accentuation, frequent brief oxygen desaturations, and overall reduction of sleep time.    RECOMMENDATION: The patient will continue CPAP therapy with an interface of her choice, an  auto titration CPAP device will be provided with a setting between 5 and 12 cm water pressure, 2 cm expiratory pressure relief, and heated humidification.    INTERPRETING PHYSICIAN:   Larey Seat, MD   Medical Director of [facility] at [facility].

## 2022-09-18 NOTE — Telephone Encounter (Signed)
[facility] [employer] [place]   HOME SLEEP TEST REPORT ( by Watch PAT)   STUDY DATE: 09-12-2022    ORDERING CLINICIAN: Ward Givens, NP REFERRING CLINICIAN: Larey Seat, MD    CLINICAL INFORMATION/HISTORY: Today 08/02/22:Ms. Vessel is a 51 year old female with a history of obstructive sleep apnea on CPAP.  She returns today for follow-up. Denies any new issues. DL is below. Would like a new machine. 08/02/21: Ms. Dettloff is a 51 year old female with a history of obstructive sleep apnea on CPAP.  She returns today for follow-up.  Her download indicates good compliance.  She denies any new issues.  Reports that the CPAP is working well for her.  She returns today for follow-up.   CPAP set up date:  CPAP used at 8 cm H20, 2cm EPR and AHI 1.1/h.      Epworth sleepiness score: 2 /24.on CPAP   BMI: 32 kg/m   Neck Circumference: NA   FINDINGS:   Sleep Summary:   Total Recording Time (hours, min): 9 hours and 18 minutes       Total Sleep Time (hours, min):    7 hours 28 minutes             Percent REM (%): 21.3%                                      Respiratory Indices:   Calculated pAHI (per hour):    47.1/h                         REM pAHI: 71.4/h                                               NREM pAHI:   40.4/h                           Positional AHI:     No data  Snoring: No data                                              Oxygen Saturation Statistics:        O2 Saturation Range (%):   Between a nadir of 84 and a maximum saturation at 99% with a mean saturation of 92%.                                    O2 Saturation (minutes) <89%: 3 minutes         Pulse Rate Statistics:               Pulse Range:    The mean heart rate was 76 bpm, heart rate varied between 58 and 110 bpm             IMPRESSION:  This HST confirms the presence of severe sleep apnea with REM sleep accentuation, frequent brief oxygen desaturations, and overall reduction of sleep time.    RECOMMENDATION: The patient will continue CPAP therapy with an interface of her choice, an  auto titration CPAP device will be provided with a setting between 5 and 12 cm water pressure, 2 cm expiratory pressure relief, and heated humidification.    INTERPRETING PHYSICIAN:   Larey Seat, MD   Medical Director of [facility] at [facility]

## 2022-09-19 ENCOUNTER — Other Ambulatory Visit: Payer: Self-pay | Admitting: Adult Health

## 2022-09-19 DIAGNOSIS — G4733 Obstructive sleep apnea (adult) (pediatric): Secondary | ICD-10-CM

## 2022-09-20 ENCOUNTER — Telehealth: Payer: Self-pay | Admitting: *Deleted

## 2022-09-20 DIAGNOSIS — Z9989 Dependence on other enabling machines and devices: Secondary | ICD-10-CM

## 2022-09-20 NOTE — Telephone Encounter (Signed)
I called the pt and let her know her sleep study confirmed presence of severe OSA. Pt would like a travel machine. If insurance won't pay, she will proceed with a regular home machine but still would like a prescription for the travel machine so she has option to purchase OOP. She also wants to switch DME companies. We discussed. Will switch to [facility].

## 2022-09-24 NOTE — Telephone Encounter (Signed)
Let order both and see what happens

## 2022-09-25 NOTE — Addendum Note (Signed)
Addended by: Trudie Buckler on: 09/25/2022 10:05 AM   Modules accepted: Orders

## 2022-09-25 NOTE — Telephone Encounter (Signed)
Referral for DME switch, new CPAP machine, and new travel CPAP machine all faxed to [place] [facility]. Received a receipt of confirmation.

## 2022-09-25 NOTE — Addendum Note (Signed)
Addended by: Gildardo Griffes on: 09/25/2022 08:15 AM   Modules accepted: Orders

## 2022-10-09 ENCOUNTER — Encounter: Payer: Self-pay | Admitting: Nurse Practitioner

## 2022-10-09 ENCOUNTER — Other Ambulatory Visit (HOSPITAL_COMMUNITY)
Admission: RE | Admit: 2022-10-09 | Discharge: 2022-10-09 | Disposition: A | Discharge status: Home / Self Care | Payer: Managed Care, Other (non HMO) | Source: Ambulatory Visit | Attending: Nurse Practitioner | Admitting: Nurse Practitioner

## 2022-10-09 ENCOUNTER — Ambulatory Visit: Payer: Managed Care, Other (non HMO) | Admitting: Nurse Practitioner

## 2022-10-09 VITALS — BP 128/82 | HR 81 | Temp 98.1°F | Ht 64.0 in | Wt 189.0 lb

## 2022-10-09 DIAGNOSIS — H6121 Impacted cerumen, right ear: Secondary | ICD-10-CM | POA: Diagnosis not present

## 2022-10-09 DIAGNOSIS — Z6832 Body mass index (BMI) 32.0-32.9, adult: Secondary | ICD-10-CM

## 2022-10-09 DIAGNOSIS — Z0000 Encounter for general adult medical examination without abnormal findings: Secondary | ICD-10-CM | POA: Diagnosis not present

## 2022-10-09 DIAGNOSIS — E6609 Other obesity due to excess calories: Secondary | ICD-10-CM

## 2022-10-09 DIAGNOSIS — I10 Essential (primary) hypertension: Secondary | ICD-10-CM

## 2022-10-09 DIAGNOSIS — E1169 Type 2 diabetes mellitus with other specified complication: Secondary | ICD-10-CM | POA: Diagnosis not present

## 2022-10-09 DIAGNOSIS — R202 Paresthesia of skin: Secondary | ICD-10-CM | POA: Diagnosis not present

## 2022-10-09 DIAGNOSIS — R7989 Other specified abnormal findings of blood chemistry: Secondary | ICD-10-CM | POA: Diagnosis not present

## 2022-10-09 DIAGNOSIS — N912 Amenorrhea, unspecified: Secondary | ICD-10-CM

## 2022-10-09 DIAGNOSIS — Z79899 Other long term (current) drug therapy: Secondary | ICD-10-CM

## 2022-10-09 DIAGNOSIS — E669 Obesity, unspecified: Secondary | ICD-10-CM

## 2022-10-09 DIAGNOSIS — Z124 Encounter for screening for malignant neoplasm of cervix: Secondary | ICD-10-CM

## 2022-10-09 DIAGNOSIS — Z2821 Immunization not carried out because of patient refusal: Secondary | ICD-10-CM

## 2022-10-09 LAB — POCT URINALYSIS DIPSTICK
Bilirubin, UA: NEGATIVE
Blood, UA: NEGATIVE
Glucose, UA: NEGATIVE
Ketones, UA: NEGATIVE
Leukocytes, UA: NEGATIVE
Nitrite, UA: NEGATIVE
Protein, UA: NEGATIVE
Spec Grav, UA: 1.025
Urobilinogen, UA: 0.2 U/dL
pH, UA: 6

## 2022-10-09 MED ORDER — NEBIVOLOL HCL 10 MG PO TABS: 10.0000 mg | ORAL_TABLET | Freq: Every day | ORAL | 5 refills | Status: DC

## 2022-10-09 NOTE — Progress Notes (Signed)
I,[name],acting as a Education administrator for [name], FNP.,have documented all relevant documentation on the behalf of [name], FNP,as directed by  [name], FNP while in the presence of [name], [place].   Subjective:     Patient ID: Melanie Little , female    DOB: [birth year] , 50 y.o.   MRN: [id number]   Chief Complaint  Patient presents with   Annual Exam    HPI  Here for HM.   She was told by GYN that her prolactin levels were high, she was given a med she would like discuss today. She was seen by Dr. Terri Piedra. She had been without a menstrual cycle x 3 months and seen Dr Terri Piedra was told prolactin levels were elevated.   She reports having tingling when taking her spironolactone. Mostly felt at night. She is drinking more water. She is to having a new machine for her CPAP next week - followed by [facility].   She has not done a PAP.      Past Medical History:  Diagnosis Date   Abnormal EKG    Abnormal Pap smear of cervix about 2013, 2016   Colpo biopsy with normal result per pt.  colpo 2016 HPV changes   Diabetes mellitus without complication ([place])    DUB (dysfunctional uterine bleeding)    around 2012, saw gyn at the time for eval   Elevated hemoglobin A1c    Fibroid    multiple fibroids   Hypertension    Left ovarian cyst 05/2018   do yearly ultrasound.  Doing GYN ONC consulation for monitoring.    Migraine    OSA on CPAP    Resistant hypertension 05/02/2021     Family History  Problem Relation Age of Onset   CVA Mother 32       deceased, was 39 at the age of her first stroke   Diabetes Father    Deep vein thrombosis Father    Breast cancer Maternal Aunt    Breast cancer Maternal Aunt    Sleep apnea Neg Hx      Current Outpatient Medications:    amLODipine (NORVASC) 5 MG tablet, Take 1 tablet (5 mg total) by mouth daily., Disp: 90 tablet, Rfl: 3   Blood Glucose Monitoring Suppl (ONE TOUCH ULTRA MINI) w/Device KIT, 1 each by Does not apply route 3  (three) times daily as needed., Disp: 1 kit, Rfl: 1   EPINEPHrine 0.3 mg/0.3 mL IJ SOAJ injection, Inject 0.3 mLs into the muscle as needed. For Anaphylaxis, Disp: , Rfl:    glucose blood test strip, Use as instructed, Disp: 100 each, Rfl: 12   hydrALAZINE (APRESOLINE) 25 MG tablet, Take 1 tablet (25 mg total) by mouth 2 (two) times daily., Disp: 180 tablet, Rfl: 1   hydrALAZINE (APRESOLINE) 50 MG tablet, Take 50 mg by mouth daily., Disp: , Rfl:    Lancets (ONETOUCH ULTRASOFT) lancets, Use as instructed, Disp: 100 each, Rfl: 12   norethindrone (NORLYDA) 0.35 MG tablet, TAKE 1 TABLET(0.35 MG) BY MOUTH DAILY, Disp: 84 tablet, Rfl: 3   Semaglutide, 2 MG/DOSE, (OZEMPIC, 2 MG/DOSE,) 8 MG/3ML SOPN, Inject 2 mg into the skin once a week., Disp: 3 mL, Rfl: 5   spironolactone (ALDACTONE) 25 MG tablet, Take 1 tablet (25 mg total) by mouth daily., Disp: 90 tablet, Rfl: 3   valsartan (DIOVAN) 160 MG tablet, Take 1 tablet (160 mg total) by mouth daily., Disp: 90 tablet, Rfl: 3   nebivolol (BYSTOLIC) 10  MG tablet, Take 1 tablet (10 mg total) by mouth daily., Disp: 30 tablet, Rfl: 5   Semaglutide, 1 MG/DOSE, 4 MG/3ML SOPN, Inject 1 mg into the skin once a week., Disp: 3 mL, Rfl: 3   Vitamin D, Ergocalciferol, (DRISDOL) 1.25 MG (50000 UNIT) CAPS capsule, Take 1 capsule (50,000 Units total) by mouth every 7 (seven) days., Disp: 12 capsule, Rfl: 1   Allergies  Allergen Reactions   Sulfamethoxazole-Trimethoprim Other (See Comments), Hives, Itching and Swelling      The patient states she uses OCP (estrogen/progesterone) for birth control.  Patient's last menstrual period was 09/29/2022.. Negative for Dysmenorrhea and Negative for Menorrhagia. Negative for: breast discharge, breast lump(s), breast pain and breast self exam. Associated symptoms include abnormal vaginal bleeding. Pertinent negatives include abnormal bleeding (hematology), anxiety, decreased libido, depression, difficulty falling sleep, dyspareunia,  history of infertility, nocturia, sexual dysfunction, sleep disturbances, urinary incontinence, urinary urgency, vaginal discharge and vaginal itching. Diet regular.  The patient states her exercise level is minimal 2 days a week for 1.5 hours. Cardiologist [facility] 2 times a week for 12 weeks.   The patient's tobacco use is:  Social History   Tobacco Use  Smoking Status Never  Smokeless Tobacco Never   She has been exposed to passive smoke. The patient's alcohol use is:  Social History   Substance and Sexual Activity  Alcohol Use No   Alcohol/week: 0.0 standard drinks of alcohol   Additional information: Last pap 09/25/2019, next one scheduled for today, she has seen GYN previously    Review of Systems  Constitutional: Negative.   HENT: Negative.    Eyes: Negative.   Respiratory: Negative.    Cardiovascular: Negative.   Gastrointestinal: Negative.   Endocrine: Negative.   Genitourinary: Negative.   Musculoskeletal: Negative.   Skin: Negative.   Allergic/Immunologic: Negative.   Neurological: Negative.   Hematological: Negative.   Psychiatric/Behavioral: Negative.       Today's Vitals   10/09/22 1519  BP: 128/82  Pulse: 81  Temp: 98.1 F (36.7 C)  SpO2: 98%  Weight: 189 lb (85.7 kg)  Height: '5\' 4"'  (1.626 m)  PainSc: 0-No pain   Body mass index is 32.44 kg/m.  Wt Readings from Last 3 Encounters:  10/15/22 191 lb (86.6 kg)  10/09/22 189 lb (85.7 kg)  08/02/22 188 lb (85.3 kg)    Objective:  Physical Exam Vitals reviewed.  Constitutional:      General: She is not in acute distress.    Appearance: Normal appearance. She is well-developed. She is obese.  HENT:     Head: Normocephalic and atraumatic.     Right Ear: Hearing and external ear normal. There is impacted cerumen.     Left Ear: Hearing, tympanic membrane, ear canal and external ear normal. There is no impacted cerumen.     Nose:     Comments: Deferred - masked     Mouth/Throat:     Comments: Deferred - masked Eyes:     General: Lids are normal.     Extraocular Movements: Extraocular movements intact.     Conjunctiva/sclera: Conjunctivae normal.     Pupils: Pupils are equal, round, and reactive to light.     Funduscopic exam:    Right eye: No papilledema.        Left eye: No papilledema.  Neck:     Thyroid: No thyroid mass.     Vascular: No carotid bruit.  Cardiovascular:  Rate and Rhythm: Normal rate and regular rhythm.     Pulses: Normal pulses.     Heart sounds: Normal heart sounds. No murmur heard. Pulmonary:     Effort: Pulmonary effort is normal.     Breath sounds: Normal breath sounds.  Chest:     Chest wall: No mass.  Breasts:    Tanner Score is 5.     Right: Normal. No mass or tenderness.     Left: Normal. No mass or tenderness.  Abdominal:     General: Abdomen is flat. Bowel sounds are normal. There is no distension.     Palpations: Abdomen is soft.     Tenderness: There is no abdominal tenderness.  Genitourinary:    Rectum: Guaiac result negative.  Musculoskeletal:        General: No swelling. Normal range of motion.     Cervical back: Full passive range of motion without pain, normal range of motion and neck supple.     Right lower leg: No edema.     Left lower leg: No edema.  Lymphadenopathy:     Upper Body:     Right upper body: No supraclavicular, axillary or pectoral adenopathy.     Left upper body: No supraclavicular, axillary or pectoral adenopathy.  Skin:    General: Skin is warm and dry.     Capillary Refill: Capillary refill takes less than 2 seconds.  Neurological:     General: No focal deficit present.     Mental Status: She is alert and oriented to person, place, and time.     Cranial Nerves: No cranial nerve deficit.     Sensory: No sensory deficit.  Psychiatric:        Mood and Affect: Mood normal.        Behavior: Behavior normal.        Thought Content: Thought content normal.        Judgment:  Judgment normal.         Assessment And Plan:     1. Encounter for annual health examination  2. Class 1 obesity due to excess calories with serious comorbidity and body mass index (BMI) of 32.0 to 32.9 in adult  3. Influenza vaccination declined  4. Primary hypertension Comments: Blood pressure is better controlled, continue current medications and f/u with Cardiology. HR 72 with NSR - POCT Urinalysis Dipstick (81002) - Microalbumin / creatinine urine ratio - EKG 12-Lead - nebivolol (BYSTOLIC) 10 MG tablet; Take 1 tablet (10 mg total) by mouth daily.  Dispense: 30 tablet; Refill: 5  5. Type 2 diabetes mellitus with obesity ([place]) Comments: HgbA1c was slightly elevated at last visit. Continue current medications - Hemoglobin A1c - TSH - Lipid panel  6. Abnormal blood creatinine level Comments: Will recheck levels today, encouraged to stay well hydrated with water - CMP14+EGFR  7. Other long term (current) drug therapy - CBC - TSH  8. Tingling of both feet Comments: Will check for any metabolic causes.  - Vitamin B12 - VITAMIN D 25 Hydroxy (Vit-D Deficiency, Fractures)  9. Amenorrhea Comments: prolactin elevated with Dr. Terri Piedra and would like repeated. She was given Bromocriptine but not started on the medication. will get records - Prolactin  10. Impacted cerumen of right ear Comments: Water lavage done with good results - Ear Lavage  11. Encounter for Papanicolaou smear of cervix - Cytology -Pap Smear She is encouraged to strive for BMI less than 30 to decrease cardiac risk. Advised to aim for at  least 150 minutes of exercise per week.     Patient was given opportunity to ask questions. Patient verbalized understanding of the plan and was able to repeat key elements of the plan. All questions were answered to their satisfaction.   [name], FNP   I, [name], FNP, have reviewed all documentation for this visit. The documentation on 10/22/22 for the  exam, diagnosis, procedures, and orders are all accurate and complete.   THE PATIENT IS ENCOURAGED TO PRACTICE SOCIAL DISTANCING DUE TO THE COVID-19 PANDEMIC.

## 2022-10-10 LAB — CMP14+EGFR
ALT: 13 IU/L (ref 0–32)
AST: 14 IU/L (ref 0–40)
Albumin/Globulin Ratio: 1.9 (ref 1.2–2.2)
Albumin: 4.9 g/dL (ref 3.9–4.9)
Alkaline Phosphatase: 70 IU/L (ref 44–121)
BUN/Creatinine Ratio: 11 (ref 9–23)
BUN: 10 mg/dL (ref 6–24)
Bilirubin Total: 1.4 mg/dL — ABNORMAL HIGH (ref 0.0–1.2)
CO2: 18 mmol/L — ABNORMAL LOW (ref 20–29)
Calcium: 9.7 mg/dL (ref 8.7–10.2)
Chloride: 101 mmol/L (ref 96–106)
Creatinine, Ser: 0.87 mg/dL (ref 0.57–1.00)
Globulin, Total: 2.6 g/dL (ref 1.5–4.5)
Glucose: 76 mg/dL (ref 70–99)
Potassium: 3.9 mmol/L (ref 3.5–5.2)
Sodium: 137 mmol/L (ref 134–144)
Total Protein: 7.5 g/dL (ref 6.0–8.5)
eGFR: 82 mL/min/{1.73_m2} (ref >59)

## 2022-10-10 LAB — CBC
Hematocrit: 38.6 % (ref 34.0–46.6)
Hemoglobin: 12.7 g/dL (ref 11.1–15.9)
MCH: 22.5 pg — ABNORMAL LOW (ref 26.6–33.0)
MCHC: 32.9 g/dL (ref 31.5–35.7)
MCV: 68 fL — ABNORMAL LOW (ref 79–97)
Platelets: 435 10*3/uL (ref 150–450)
RBC: 5.64 x10E6/uL — ABNORMAL HIGH (ref 3.77–5.28)
RDW: 15.8 % — ABNORMAL HIGH (ref 11.7–15.4)
WBC: 8.9 10*3/uL (ref 3.4–10.8)

## 2022-10-10 LAB — LIPID PANEL
Chol/HDL Ratio: 3.9 ratio (ref 0.0–4.4)
Cholesterol, Total: 188 mg/dL (ref 100–199)
HDL: 48 mg/dL (ref >39)
LDL Chol Calc (NIH): 107 mg/dL — ABNORMAL HIGH (ref 0–99)
Triglycerides: 190 mg/dL — ABNORMAL HIGH (ref 0–149)
VLDL Cholesterol Cal: 33 mg/dL (ref 5–40)

## 2022-10-10 LAB — HEMOGLOBIN A1C
Est. average glucose Bld gHb Est-mCnc: 128 mg/dL
Hgb A1c MFr Bld: 6.1 % — ABNORMAL HIGH (ref 4.8–5.6)

## 2022-10-10 LAB — MICROALBUMIN / CREATININE URINE RATIO
Creatinine, Urine: 148.4 mg/dL
Microalb/Creat Ratio: 4 mg/g creat (ref 0–29)
Microalbumin, Urine: 5.9 ug/mL

## 2022-10-10 LAB — TSH: TSH: 4.03 u[IU]/mL (ref 0.450–4.500)

## 2022-10-10 LAB — PROLACTIN: Prolactin: 46 ng/mL — ABNORMAL HIGH (ref 4.8–23.3)

## 2022-10-10 LAB — VITAMIN D 25 HYDROXY (VIT D DEFICIENCY, FRACTURES): Vit D, 25-Hydroxy: 13.2 ng/mL — ABNORMAL LOW (ref 30.0–100.0)

## 2022-10-10 LAB — VITAMIN B12: Vitamin B-12: 503 pg/mL (ref 232–1245)

## 2022-10-11 LAB — CYTOLOGY - PAP
Adequacy: ABSENT
Diagnosis: NEGATIVE

## 2022-10-14 ENCOUNTER — Ambulatory Visit
Admission: RE | Admit: 2022-10-14 | Discharge: 2022-10-14 | Disposition: A | Discharge status: Home / Self Care | Payer: Managed Care, Other (non HMO) | Source: Ambulatory Visit | Attending: Obstetrics and Gynecology | Admitting: Obstetrics and Gynecology

## 2022-10-14 DIAGNOSIS — D259 Leiomyoma of uterus, unspecified: Secondary | ICD-10-CM

## 2022-10-14 MED ORDER — GADOPICLENOL 0.5 MMOL/ML IV SOLN
8.5000 mL | Freq: Once | INTRAVENOUS | Status: AC | PRN
  Administered 2022-10-14: 8.5 mL via INTRAVENOUS

## 2022-10-16 ENCOUNTER — Other Ambulatory Visit: Payer: Self-pay | Admitting: Nurse Practitioner

## 2022-10-16 ENCOUNTER — Encounter: Payer: Self-pay | Admitting: Nurse Practitioner

## 2022-10-16 DIAGNOSIS — R7989 Other specified abnormal findings of blood chemistry: Secondary | ICD-10-CM

## 2022-10-16 MED ORDER — VITAMIN D (ERGOCALCIFEROL) 1.25 MG (50000 UNIT) PO CAPS: 50000.0000 [IU] | ORAL_CAPSULE | ORAL | 1 refills | Status: DC

## 2022-10-16 NOTE — Progress Notes (Signed)
[facility] PREP Evaluation  Patient Details  Name: Melanie Little MRN: [id number] Date of Birth: 03/11/1971 Age: 51 y.o. PCP: [name], FNP  Vitals:   10/15/22 1800  BP: 138/88  Pulse: 83  SpO2: 99%  Weight: 191 lb (86.6 kg)     [facility] Eval - 10/16/22 1600       [facility] "PREP" Location   [facility] "PREP" Location [facility]      Referral    Referring Provider Pharm D    Reason for referral Hypertension;High Cholesterol   pre diabetes   Program Start Date 10/16/22   T/TH 6p-715p x 12 wks     Measurement   Waist Circumference 42 inches    Hip Circumference 43.5 inches    Body fat 40.4 percent      Information for Trainer   Goals Goal weight of 150-160, lose 20 lbs during PREP, feel better, more energy, get biomarkers under control    Current Exercise none    Orthopedic Concerns back pain, lower    Pertinent Medical History HTN, prediabetes, high cholesterol    Current Barriers mindset    Restrictions/Precautions --   none   Medications that affect exercise Beta blocker      Timed Up and Go (TUGS)   Timed Up and Go Low risk <9 seconds      Mobility and Daily Activities   I find it easy to walk up or down two or more flights of stairs. 4    I have no trouble taking out the trash. 4    I do housework such as vacuuming and dusting on my own without difficulty. 4    I can easily lift a gallon of milk (8lbs). 4    I can easily walk a mile. 4    I have no trouble reaching into high cupboards or reaching down to pick up something from the floor. 4    I do not have trouble doing out-door work such as Armed forces logistics/support/administrative officer, raking leaves, or gardening. 1      Mobility and Daily Activities   I feel younger than my age. 1    I feel independent. 4    I feel energetic. 2    I live an active life.  2    I feel strong. 2    I feel healthy. 1    I feel active as other people my age. 1      How fit and strong are you.   Fit and Strong Total Score 38            Past Medical  History:  Diagnosis Date   Abnormal EKG    Abnormal Pap smear of cervix about 2013, 2016   Colpo biopsy with normal result per pt.  colpo 2016 HPV changes   Diabetes mellitus without complication ([place])    DUB (dysfunctional uterine bleeding)    around 2012, saw gyn at the time for eval   Elevated hemoglobin A1c    Fibroid    multiple fibroids   Hypertension    Left ovarian cyst 05/2018   do yearly ultrasound.  Doing GYN ONC consulation for monitoring.    Migraine    OSA on CPAP    Resistant hypertension 05/02/2021   Past Surgical History:  Procedure Laterality Date   BREAST REDUCTION SURGERY  2005   CHOLECYSTECTOMY  2004   INCISION AND DRAINAGE Bilateral 2015   Bilateral axillary region ebaceous cyst   IR  RADIOLOGIST EVAL & MGMT  09/14/2022   MASS EXCISION N/A 12/02/2020   Procedure: EXCISION OF TWO MIDLINE BACK SUBCUTANEOUS MASSES;  Surgeon: Felicie Morn, MD;  Location: [place];  Service: General;  Laterality: N/A;   REDUCTION MAMMAPLASTY Bilateral 2005   Social History   Tobacco Use  Smoking Status Never  Smokeless Tobacco Never    Barnett Hatter 10/16/2022, 4:27 PM

## 2022-10-18 ENCOUNTER — Encounter: Payer: Self-pay | Admitting: Nurse Practitioner

## 2022-10-19 ENCOUNTER — Other Ambulatory Visit: Payer: Self-pay

## 2022-10-19 MED ORDER — SEMAGLUTIDE (1 MG/DOSE) 4 MG/3ML ~~LOC~~ SOPN: 1.0000 mg | PEN_INJECTOR | SUBCUTANEOUS | 3 refills | Status: DC

## 2022-10-22 ENCOUNTER — Encounter: Payer: Self-pay | Admitting: Nurse Practitioner

## 2022-10-26 NOTE — Progress Notes (Signed)
[facility] PREP Weekly Session  Patient Details  Name: Melanie Little MRN: [id number] Date of Birth: 01/25/1971 Age: 51 y.o. PCP: [name], FNP  Vitals:   10/23/22 1800  Weight: 191 lb 12.8 oz (87 kg)     [facility] Weekly seesion - 10/26/22 1400       [facility] "PREP" Location   [facility] "PREP" Location [facility]      Weekly Session   Topic Discussed Importance of resistance training;Other ways to be active    Minutes exercised this week 45 minutes    Classes attended to date [place] 10/26/2022, 2:16 PM

## 2022-10-31 NOTE — Progress Notes (Signed)
[facility] PREP Weekly Session  Patient Details  Name: Melanie Little MRN: [id number] Date of Birth: 12/15/1971 Age: 51 y.o. PCP: [name], FNP  Vitals:   10/30/22 1800  Weight: 191 lb 3.2 oz (86.7 kg)     [facility] Weekly seesion - 10/31/22 1300       [facility] "PREP" Location   [facility] "PREP" [facility]      Weekly Session   Topic Discussed Healthy eating tips    Minutes exercised this week 30 minutes    Classes attended to date [place] 10/31/2022, 1:26 PM

## 2022-11-01 ENCOUNTER — Other Ambulatory Visit (HOSPITAL_COMMUNITY): Payer: Self-pay | Admitting: Diagnostic Radiology

## 2022-11-01 DIAGNOSIS — D259 Leiomyoma of uterus, unspecified: Secondary | ICD-10-CM

## 2022-11-04 ENCOUNTER — Ambulatory Visit
Admission: RE | Admit: 2022-11-04 | Discharge: 2022-11-04 | Disposition: A | Discharge status: Home / Self Care | Payer: Managed Care, Other (non HMO) | Source: Ambulatory Visit | Attending: Nurse Practitioner | Admitting: Nurse Practitioner

## 2022-11-04 DIAGNOSIS — R7989 Other specified abnormal findings of blood chemistry: Secondary | ICD-10-CM

## 2022-11-04 MED ORDER — GADOPICLENOL 0.5 MMOL/ML IV SOLN
9.0000 mL | Freq: Once | INTRAVENOUS | Status: AC | PRN
  Administered 2022-11-04: 4 mL via INTRAVENOUS

## 2022-11-05 ENCOUNTER — Other Ambulatory Visit: Payer: Self-pay | Admitting: Nurse Practitioner

## 2022-11-05 DIAGNOSIS — E119 Type 2 diabetes mellitus without complications: Secondary | ICD-10-CM

## 2022-11-07 ENCOUNTER — Ambulatory Visit
Admission: RE | Admit: 2022-11-07 | Discharge: 2022-11-07 | Disposition: A | Discharge status: Home / Self Care | Payer: Managed Care, Other (non HMO) | Source: Ambulatory Visit | Attending: Diagnostic Radiology | Admitting: Diagnostic Radiology

## 2022-11-07 DIAGNOSIS — D259 Leiomyoma of uterus, unspecified: Secondary | ICD-10-CM

## 2022-11-07 NOTE — Progress Notes (Signed)
Chief Complaint: Patient was consulted remotely today ([name]) for review of pelvic MRI   Referring Physician(s): Sherlyn Hay  History of Present Illness: Melanie Little is a 51 y.o. female with uterine fibroids and heavy menstrual bleeding.  I saw the patient in consultation on 09/14/2022 and we discussed uterine artery embolization.  Patient underwent a pelvic MRI on 10/14/2022.  MRI demonstrated numerous uterine fibroids, largest measuring 7.3 cm.  There were no intracavitary or significantly pedunculated fibroids.  A small endometrioma in the left adnexa adjacent to the left ovary was identified.  Patient had additional questions about uterine artery embolization.  She wanted to know if all of the fibroids will be treated at once and she wanted to know if the uterine fibroids would grow back.  I explained to her that majority if not all of the uterine fibroids are typically treated with bilateral uterine artery embolization procedure unless the patient has variant anatomy.  We discussed how the uterus and the fibroids will shrink over time and she could be expect approximately 50% shrinkage of the fibroids in 6 to 12 months.  I explained to her that successfully treated and infarcted fibroids should not grow back but she is always at risk for developing new fibroids.  However, based on her age I am not overly concerned that she will have significant new fibroid growth in the future.  Past Medical History:  Diagnosis Date   Abnormal EKG    Abnormal Pap smear of cervix about 2013, 2016   Colpo biopsy with normal result per pt.  colpo 2016 HPV changes   Diabetes mellitus without complication ([place])    DUB (dysfunctional uterine bleeding)    around 2012, saw gyn at the time for eval   Elevated hemoglobin A1c    Fibroid    multiple fibroids   Hypertension    Left ovarian cyst 05/2018   do yearly ultrasound.  Doing GYN ONC consulation for monitoring.    Migraine    OSA on  CPAP    Resistant hypertension 05/02/2021    Past Surgical History:  Procedure Laterality Date   BREAST REDUCTION SURGERY  2005   CHOLECYSTECTOMY  2004   INCISION AND DRAINAGE Bilateral 2015   Bilateral axillary region ebaceous cyst   IR RADIOLOGIST EVAL & MGMT  09/14/2022   MASS EXCISION N/A 12/02/2020   Procedure: EXCISION OF TWO MIDLINE BACK SUBCUTANEOUS MASSES;  Surgeon: Felicie Morn, MD;  Location: [facility];  Service: General;  Laterality: N/A;   REDUCTION MAMMAPLASTY Bilateral 2005    Allergies: Sulfamethoxazole-trimethoprim  Medications: Prior to Admission medications   Medication Sig Start Date End Date Taking? Authorizing Provider  amLODipine (NORVASC) 5 MG tablet Take 1 tablet (5 mg total) by mouth daily. 03/01/22   Skeet Latch, MD  Blood Glucose Monitoring Suppl (ONE TOUCH ULTRA MINI) w/Device KIT 1 each by Does not apply route 3 (three) times daily as needed. 05/11/21   Brimage, Ronnette Juniper, DO  EPINEPHrine 0.3 mg/0.3 mL IJ SOAJ injection Inject 0.3 mLs into the muscle as needed. For Anaphylaxis 11/17/21   [provider]  glucose blood test strip Use as instructed 05/15/21   Lyndee Hensen, DO  hydrALAZINE (APRESOLINE) 25 MG tablet Take 1 tablet (25 mg total) by mouth 2 (two) times daily. 07/25/22   [name], FNP  hydrALAZINE (APRESOLINE) 50 MG tablet Take 50 mg by mouth daily.    [provider]  Lancets [facility] ULTRASOFT) lancets Use as  instructed 05/15/21   Lyndee Hensen, DO  nebivolol (BYSTOLIC) 10 MG tablet Take 1 tablet (10 mg total) by mouth daily. 10/09/22   [name], FNP  norethindrone (NORLYDA) 0.35 MG tablet TAKE 1 TABLET(0.35 MG) BY MOUTH DAILY 01/16/22   [name], FNP  Semaglutide, 1 MG/DOSE, 4 MG/3ML SOPN Inject 1 mg into the skin once a week. 10/19/22   [name], FNP  Semaglutide, 2 MG/DOSE, (OZEMPIC, 2 MG/DOSE,) 8 MG/3ML SOPN Inject 2 mg into the skin once a week. 06/26/22   [name], FNP   spironolactone (ALDACTONE) 25 MG tablet Take 1 tablet (25 mg total) by mouth daily. 05/11/22   Skeet Latch, MD  valsartan (DIOVAN) 160 MG tablet Take 1 tablet (160 mg total) by mouth daily. 05/02/22   Skeet Latch, MD  Vitamin D, Ergocalciferol, (DRISDOL) 1.25 MG (50000 UNIT) CAPS capsule Take 1 capsule (50,000 Units total) by mouth every 7 (seven) days. 10/16/22   [name], FNP     Family History  Problem Relation Age of Onset   CVA Mother 93       deceased, was 49 at the age of her first stroke   Diabetes Father    Deep vein thrombosis Father    Breast cancer Maternal Aunt    Breast cancer Maternal Aunt    Sleep apnea Neg Hx     Social History   Socioeconomic History   Marital status: Married    Spouse name: Not on file   Number of children: 1   Years of education: Not on file   Highest education level: Not on file  Occupational History   Occupation: [employer]  Tobacco Use   Smoking status: Never   Smokeless tobacco: Never  Vaping Use   Vaping Use: Never used  Substance and Sexual Activity   Alcohol use: No    Alcohol/week: 0.0 standard drinks of alcohol   Drug use: No   Sexual activity: Yes    Birth control/protection: Pill    Comment: [place]  Other Topics Concern   Not on file  Social History Narrative   Work or School: Education officer, 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[employer]      Home Situation: lives with husband      Spiritual Beliefs: Baptist      Lifestyle: walks every other day; diet is bad      Social Determinants of Health   Financial Resource Strain: Low Risk  (03/01/2022)   Overall Financial Resource Strain (CARDIA)    Difficulty of Paying Living Expenses: Not hard at all  Food Insecurity: No Food Insecurity (03/01/2022)   Hunger Vital Sign    Worried About Running Out of Food in the Last Year: Never true    [place] in the Last Year: Never true  Transportation Needs: No Transportation Needs  (03/01/2022)   PRAPARE - Hydrologist (Medical): No    Lack of Transportation (Non-Medical): No  Physical Activity: Inactive (03/01/2022)   Exercise Vital Sign    Days of Exercise per Week: 0 days    Minutes of Exercise per Session: 0 min  Stress: Not on file  Social Connections: Not on file      Review of Systems  Genitourinary:  Positive for menstrual problem.      Physical Exam No direct physical exam was performed   Vital Signs: LMP 09/29/2022   Imaging: MR Brain W Wo Contrast  Result Date: 11/07/2022 CLINICAL DATA:  Elevated prolactin levels. EXAM: MRI HEAD WITHOUT AND WITH CONTRAST TECHNIQUE: Multiplanar, multiecho pulse sequences of the brain and surrounding structures were obtained without and with intravenous contrast. CONTRAST:  4 mL Vueway COMPARISON:  MRI of the head without contrast 03/19/2018. FINDINGS: Brain: No acute infarct, hemorrhage, or mass lesion is present. Periventricular and scattered subcortical T2 hyperintensities are mild to moderately advanced for age. This has advanced since the prior exam. Deep brain nuclei are within normal limits. The ventricles are of normal size. No significant extraaxial fluid collection is present. The internal auditory canals are within normal limits. The brainstem and cerebellum are within normal limits. Postcontrast images demonstrate no pathologic enhancement. Dedicated imaging of pituitary demonstrates a normal size gland. Dedicated imaging the pituitary demonstrates asymmetry of the gland, larger on the right. The pituitary stalk is midline. A central and right paracentral peripherally enhancing lesion measures 9 x 7 mm on coronal images. Vascular: Insert normal flow Skull and upper cervical spine: The craniocervical junction is normal. Upper cervical spine is within normal limits. Marrow signal is unremarkable. Sinuses/Orbits: The paranasal sinuses and mastoid air cells are clear. The globes and orbits  are within normal limits. IMPRESSION: 1. 9 x 7 mm central and right paracentral peripherally enhancing lesion within the pituitary gland compatible with a pituitary microadenoma. 2. Progressive periventricular and scattered subcortical T2 hyperintensities bilaterally. The finding is nonspecific but can be seen in the setting of chronic microvascular ischemia, a demyelinating process such as multiple sclerosis, vasculitis, complicated migraine headaches, or as the sequelae of a prior infectious or inflammatory process. Electronically Signed   By: San Morelle M.D.   On: 11/07/2022 08:33   MR PELVIS W WO CONTRAST  Result Date: 10/15/2022 CLINICAL DATA:  Symptomatic uterine fibroids.  Treatment planning. EXAM: MRI PELVIS WITHOUT AND WITH CONTRAST TECHNIQUE: Multiplanar multisequence MR imaging of the pelvis was performed both before and after administration of intravenous contrast. CONTRAST:  9 mL Vueway COMPARISON:  None Available. FINDINGS: Lower Urinary Tract: No bladder or urethral abnormality identified. Bowel:  Unremarkable visualized pelvic bowel loops. Vascular/Lymphatic: No pathologically enlarged lymph nodes or other significant abnormality. Reproductive: -- Uterus: Measures 16.6 x 12.7 by 14.5 cm (volume = 1600 cm^3). Diffuse uterine involvement by numerous fibroids are seen which are submucosal, intramural, and subserosal in location. These fibroids range in size from 1 cm to the largest in the posterior corpus measuring 7.3 cm in maximum diameter. No abnormal endometrial thickening seen. Normal appearance of cervix and vagina. -- Intracavitary fibroids:  None. -- Pedunculated fibroids: None. -- Fibroid contrast enhancement: All fibroids show diffuse heterogeneous contrast enhancement, without significant degeneration/devascularization. -- Right ovary:  Appears normal.  No mass identified. -- Left ovary: A 1.8 cm benign hemorrhagic cyst is noted in the left ovary. A cystic lesion is also seen  along the medial aspect of the left ovary posterior wall of the uterus, which shows T1 hyperintensity, thin T2 hypointense rim, and lack of contrast enhancement. This measures 3.4 x 1.6 cm, and is suspicious for a small endometrioma. Other: Trace amount of free fluid noted. Musculoskeletal:  Unremarkable. IMPRESSION: Diffuse uterine involvement by numerous fibroids, largest measuring 7.3 cm. No intracavitary or pedunculated fibroids identified. Small endometrioma in left adnexa adjacent to the ovary. Electronically Signed   By: Marlaine Hind M.D.   On: 10/15/2022 15:38    Labs:  CBC: Recent Labs    10/09/22 1649  WBC 8.9  HGB 12.7  HCT 38.6  PLT 435    COAGS:  No results for input(s): "INR", "APTT" in the last 8760 hours.  BMP: Recent Labs    04/04/22 0841 04/16/22 1640 05/14/22 1409 10/09/22 1649  NA 140 141 137 137  K 3.4* 3.4* 4.4 3.9  CL 98 99 103 101  CO2 _0 18*  GLUCOSE 108* 106* 90 76  BUN _1 CALCIUM 9.6 10.2 9.0 9.7  CREATININE 1.02* 1.04* 0.88 0.87    LIVER FUNCTION TESTS: Recent Labs    01/16/22 1702 10/09/22 1649  BILITOT 0.9 1.4*  AST 12 14  ALT 17 13  ALKPHOS 80 70  PROT 7.1 7.5  ALBUMIN 4.7 4.9    TUMOR MARKERS: No results for input(s): "AFPTM", "CEA", "CA199", "CHROMGRNA" in the last 8760 hours.  Assessment and Plan:  51 year old female with numerous uterine fibroids and menorrhagia.  I reviewed the patient's MRI from 10/14/2022 and she is a good candidate for uterine artery embolization procedure.  There is a pedunculated fibroid along the anterior fundus measuring up to 3.4 cm but this has a broad-based attachment to the uterus.  We discussed the patient's concerns about incomplete fibroid treatment and potential regrowth of fibroids.  Patient has no other additional questions or concerns at this time.  Patient is scheduled for uterine artery embolization on 12/25/2022.  She will contact us if she has any questions or concerns prior to  the procedure.   Electronically Signed: Burman Riis 11/07/2022, 3:21 PM   I spent a total of    10 Minutes in remote  clinical consultation, greater than 50% of which was counseling/coordinating care for uterine fibroids and menorrhagia.    Visit type: Audio only (telephone). Audio (no video) only due to patient preference and technical limitations. Alternative for in-person consultation at [facility], [address] [place], [place], [place]. This visit type was conducted due to national recommendations for restrictions regarding the COVID-19 Pandemic (e.g. social distancing).  This format is felt to be most appropriate for this patient at this time.  All issues noted in this document were discussed and addressed.  Patient ID: Melanie Little, female   DOB: [birth year], 51 y.o.   MRN: [id number]

## 2022-11-08 ENCOUNTER — Encounter: Payer: Self-pay | Admitting: Nurse Practitioner

## 2022-11-08 ENCOUNTER — Other Ambulatory Visit: Payer: Self-pay | Admitting: Nurse Practitioner

## 2022-11-08 ENCOUNTER — Encounter: Payer: Self-pay | Admitting: Lab

## 2022-11-08 DIAGNOSIS — R9389 Abnormal findings on diagnostic imaging of other specified body structures: Secondary | ICD-10-CM

## 2022-11-08 DIAGNOSIS — D352 Benign neoplasm of pituitary gland: Secondary | ICD-10-CM

## 2022-11-08 HISTORY — PX: IR RADIOLOGIST EVAL & MGMT: IMG5224

## 2022-11-14 NOTE — Progress Notes (Signed)
[facility] PREP Weekly Session  Patient Details  Name: WAVA KILDOW MRN: [id number] Date of Birth: 30-May-1971 Age: 51 y.o. PCP: [name], FNP  Vitals:   11/06/22 1800  Weight: 193 lb 6.4 oz (87.7 kg)     [facility] Weekly seesion - 11/14/22 1500       [facility] "PREP" Location   [facility] "PREP" Location [facility]      Weekly Session   Topic Discussed Health habits   sugar demo   Minutes exercised this week 55 minutes    Classes attended to date 5           Late entry   Barnett Hatter 11/14/2022, 3:49 PM

## 2022-11-15 NOTE — Progress Notes (Signed)
[facility] PREP Weekly Session  Patient Details  Name: Melanie Little MRN: [id number] Date of Birth: [birth year] Age: 51 y.o. PCP: [name], FNP  Vitals:   11/13/22 1800  Weight: 192 lb (87.1 kg)     [facility] Weekly seesion - 11/15/22 1100       [facility] "PREP" Location   [facility] "PREP" [facility]      Weekly Session   Topic Discussed Restaurant Eating   Salt demo   Minutes exercised this week 40 minutes    Classes attended to date [place] 11/15/2022, 11:52 AM

## 2022-11-20 ENCOUNTER — Telehealth: Payer: Self-pay | Admitting: Neurology

## 2022-11-20 ENCOUNTER — Encounter: Payer: Self-pay | Admitting: Neurology

## 2022-11-20 ENCOUNTER — Ambulatory Visit: Payer: Managed Care, Other (non HMO) | Admitting: Neurology

## 2022-11-20 VITALS — BP 160/88 | HR 80 | Ht 64.0 in | Wt 193.0 lb

## 2022-11-20 DIAGNOSIS — N912 Amenorrhea, unspecified: Secondary | ICD-10-CM | POA: Diagnosis not present

## 2022-11-20 DIAGNOSIS — D352 Benign neoplasm of pituitary gland: Secondary | ICD-10-CM | POA: Diagnosis not present

## 2022-11-20 DIAGNOSIS — G43711 Chronic migraine without aura, intractable, with status migrainosus: Secondary | ICD-10-CM | POA: Diagnosis not present

## 2022-11-20 NOTE — Progress Notes (Signed)
SLEEP MEDICINE and NEUROLOGY CLINIC  PATIENT: Melanie Little DOB: [birth year]  REASON FOR VISIT: follow up HISTORY FROM: patient Primary neurologist: Dr. Brett Fairy  Chief Complaint  Patient presents with   Follow-up    Pt in room #10 with her son. Pt here today for newly diagnosed pituitary adenoma and prolactinemia. Not for f/u OSA on CPAP and test results.     HISTORY OF PRESENT ILLNESS: Today 11/20/22:Ms. Melanie Little is a 51 year old female with a history of obstructive sleep apnea on CPAP.   I have the pleasure of seeing her today in the presence of her son, she had recently learned that a 3 months amenorrhea spell was related to elevated prolactin levels.  Once these were documented an MRI of the brain was obtained which confirmed a 7 mm x 9 mm pituitary adenoma.  She started on bromocriptine and has been prescribed bromocriptine for time of 45 days.  She was worried that the adenoma may be related or causing headaches which at this time is unlikely.  It is certainly the cause for amenorrhea. Now on bromocriptine she should be able to resume a menstrual cycle and she has been on birth control pills to regulate these.  In addition there was progressive microvascular ischemia noted but the patient does have a history of hypertension and diabetes and these are known risk factors for the condition.  We are not meeting today to follow-up on her recent home sleep test and new CPAP prescription.  This will be done by our nurse practitioner in a visit on 7 December 23.     08/01/20: Ms. Melanie Little is a 51 year old female with a history of obstructive sleep apnea on CPAP.  Her download indicates that she use her machine nightly for compliance of 100%.  She is averaging greater than 4 hours each night.  On average she uses her machine 7 hours and 39 minutes.  Her residual AHI is 1.9 on 6 cm of water with EPR of 2.  Leak in the 95th percentile is 22.6 L/min.  She reports that the CPAP is working well.   She returns today for an evaluation.  HISTORY (Copied from Dr.Deandrae Wajda's note) -28-2020, patient reports stiff neck, no stuffy nose and  Good compliance with CPAP.  I have the pleasure of meeting today with Melanie Little, a 51 year old left handed AAF with OSA on CPAP. She endorsed the Epworth sleepiness score at 1 out of 24 possible points, fatigue severity at 20.  Her compliance to CPAP is excellent she has used the CPAP every of the last 30 days, with an average of 8 hours and 3 minutes set pressure is 6 cmH2O was 2 cm EPR, but the residual AHI has crept up to 5.5 which is higher than before.  She does have a significant amount of air leakage and apneas are obstructive in nature.  There are some unknown type apneas that I attributed to air leak.  However with the excellent compliance and the low Epworth Sleepiness Scale I think that we do not necessarily have to make changes unless the patient would like to try a different mask or interface. Her machine was issued October 2016.  She has a history of HTN uncontrolled on 4 medications and was evaluated for various causes. No renal artery stenosis. Now on one medication and doing better.     REVIEW OF SYSTEMS: Out of a complete 14 system review of symptoms, the patient complains only of the following symptoms, and  all other reviewed systems are negative.  Headaches, no nausea, photophobia, phonophobia.  Wakes up from HA, usually just before her menstrual cycle starts.  Throbbing quality. No visual aura.     .How likely are you to doze in the following situations: 0 = not likely, 1 = slight chance, 2 = moderate chance, 3 = high chance  Sitting and Reading? Watching Television? Sitting inactive in a public place (theater or meeting)? Lying down in the afternoon when circumstances permit? Sitting and talking to someone? Sitting quietly after lunch without alcohol? In a car, while stopped for a few minutes in traffic? As a passenger in a car  for an hour without a break?  Total =0/ 24    ALLERGIES: Allergies  Allergen Reactions   Sulfamethoxazole-Trimethoprim Other (See Comments), Hives, Itching and Swelling    HOME MEDICATIONS: Outpatient Medications Prior to Visit  Medication Sig Dispense Refill   amLODipine (NORVASC) 5 MG tablet Take 1 tablet (5 mg total) by mouth daily. 90 tablet 3   hydrALAZINE (APRESOLINE) 25 MG tablet Take 1 tablet (25 mg total) by mouth 2 (two) times daily. 180 tablet 1   hydrALAZINE (APRESOLINE) 50 MG tablet Take 50 mg by mouth daily.     Lancets (ONETOUCH ULTRASOFT) lancets Use as instructed 100 each 12   nebivolol (BYSTOLIC) 10 MG tablet Take 1 tablet (10 mg total) by mouth daily. 30 tablet 5   norethindrone (NORLYDA) 0.35 MG tablet TAKE 1 TABLET(0.35 MG) BY MOUTH DAILY 84 tablet 3   Semaglutide, 1 MG/DOSE, 4 MG/3ML SOPN Inject 1 mg into the skin once a week. 3 mL 3   Semaglutide, 2 MG/DOSE, (OZEMPIC, 2 MG/DOSE,) 8 MG/3ML SOPN Inject 2 mg into the skin once a week. 3 mL 5   spironolactone (ALDACTONE) 25 MG tablet Take 1 tablet (25 mg total) by mouth daily. 90 tablet 3   valsartan (DIOVAN) 160 MG tablet Take 1 tablet (160 mg total) by mouth daily. 90 tablet 3   Vitamin D, Ergocalciferol, (DRISDOL) 1.25 MG (50000 UNIT) CAPS capsule Take 1 capsule (50,000 Units total) by mouth every 7 (seven) days. 12 capsule 1   Blood Glucose Monitoring Suppl (ONE TOUCH ULTRA MINI) w/Device KIT 1 each by Does not apply route 3 (three) times daily as needed. (Patient not taking: Reported on 11/20/2022) 1 kit 1   EPINEPHrine 0.3 mg/0.3 mL IJ SOAJ injection Inject 0.3 mLs into the muscle as needed. For Anaphylaxis (Patient not taking: Reported on 11/20/2022)     glucose blood test strip Use as instructed (Patient not taking: Reported on 11/20/2022) 100 each 12   No facility-administered medications prior to visit.    PAST MEDICAL HISTORY: Past Medical History:  Diagnosis Date   Abnormal EKG    Abnormal Pap smear  of cervix about 2013, 2016   Colpo biopsy with normal result per pt.  colpo 2016 HPV changes   Diabetes mellitus without complication ([place])    DUB (dysfunctional uterine bleeding)    around 2012, saw gyn at the time for eval   Elevated hemoglobin A1c    Fibroid    multiple fibroids   Hypertension    Left ovarian cyst 05/2018   do yearly ultrasound.  Doing GYN ONC consulation for monitoring.    Migraine    OSA on CPAP    Resistant hypertension 05/02/2021    PAST SURGICAL HISTORY: Past Surgical History:  Procedure Laterality Date   BREAST REDUCTION SURGERY  2005   CHOLECYSTECTOMY  2004  INCISION AND DRAINAGE Bilateral 2015   Bilateral axillary region ebaceous cyst   IR RADIOLOGIST EVAL & MGMT  09/14/2022   IR RADIOLOGIST EVAL & MGMT  11/08/2022   MASS EXCISION N/A 12/02/2020   Procedure: EXCISION OF TWO MIDLINE BACK SUBCUTANEOUS MASSES;  Surgeon: Felicie Morn, MD;  Location: [facility];  Service: General;  Laterality: N/A;   REDUCTION MAMMAPLASTY Bilateral 2005    FAMILY HISTORY: Family History  Problem Relation Age of Onset   CVA Mother 66       deceased, was 81 at the age of her first stroke   Diabetes Father    Deep vein thrombosis Father    Breast cancer Maternal Aunt    Breast cancer Maternal Aunt    Sleep apnea Neg Hx     SOCIAL HISTORY: Social History   Socioeconomic History   Marital status: Married    Spouse name: Not on file   Number of children: 1   Years of education: Not on file   Highest education level: Not on file  Occupational History   Occupation: [employer]  Tobacco Use   Smoking status: Never   Smokeless tobacco: Never  Vaping Use   Vaping Use: Never used  Substance and Sexual Activity   Alcohol use: No    Alcohol/week: 0.0 standard drinks of alcohol   Drug use: No   Sexual activity: Yes    Birth control/protection: Pill    Comment: Micronor  Other Topics Concern   Not on file  Social History  Narrative   Work or School: Education officer, 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[employer]      Home Situation: lives with husband      Spiritual Beliefs: Baptist      Lifestyle: walks every other day; diet is bad      Social Determinants of Health   Financial Resource Strain: Low Risk  (03/01/2022)   Overall Financial Resource Strain (CARDIA)    Difficulty of Paying Living Expenses: Not hard at all  Food Insecurity: No Food Insecurity (03/01/2022)   Hunger Vital Sign    Worried About Running Out of Food in the Last Year: Never true    [place] in the Last Year: Never true  Transportation Needs: No Transportation Needs (03/01/2022)   PRAPARE - Hydrologist (Medical): No    Lack of Transportation (Non-Medical): No  Physical Activity: Inactive (03/01/2022)   Exercise Vital Sign    Days of Exercise per Week: 0 days    Minutes of Exercise per Session: 0 min  Stress: Not on file  Social Connections: Not on file  Intimate Partner Violence: Not on file      PHYSICAL EXAM  Vitals:   11/20/22 1036  BP: (!) 160/88  Pulse: 80  Weight: 193 lb (87.5 kg)  Height: _0  (1.626 m)    Body mass index is 33.13 kg/m.  Generalized: Well developed, in no acute distress  Chest: Lungs clear to auscultation bilaterally  Neurological examination  No changes in a taste or smell.  CN : Peripheral visual field by finger perimetry intact.   Equal pupil size and reactive to light.  Mentation: Alert oriented to time, place, history taking. Follows all commands speech and language fluent  Head turning and shoulder shrug  were normal and symmetric. Gait and station: Gait is normal.    DIAGNOSTIC DATA (LABS, IMAGING, TESTING) - I reviewed patient records, labs, notes,  testing and imaging myself where available.  Lab Results  Component Value Date   WBC 8.9 10/09/2022   HGB 12.7 10/09/2022   HCT 38.6 10/09/2022   MCV 68 (L) 10/09/2022   PLT 435 10/09/2022       Component Value Date/Time   NA 137 10/09/2022 1649   K 3.9 10/09/2022 1649   CL 101 10/09/2022 1649   CO2 18 (L) 10/09/2022 1649   GLUCOSE 76 10/09/2022 1649   GLUCOSE 129 (H) 11/30/2020 0945   BUN 10 10/09/2022 1649   CREATININE 0.87 10/09/2022 1649   CREATININE 0.70 11/09/2020 1514   CALCIUM 9.7 10/09/2022 1649   PROT 7.5 10/09/2022 1649   ALBUMIN 4.9 10/09/2022 1649   AST 14 10/09/2022 1649   ALT 13 10/09/2022 1649   ALKPHOS 70 10/09/2022 1649   BILITOT 1.4 (H) 10/09/2022 1649   GFRNONAA >60 11/30/2020 0945   GFRNONAA >89 04/30/2017 1529   GFRAA >60 02/25/2019 1420   GFRAA >89 04/30/2017 1529   Lab Results  Component Value Date   CHOL 188 10/09/2022   HDL 48 10/09/2022   LDLCALC 107 (H) 10/09/2022   TRIG 190 (H) 10/09/2022   CHOLHDL 3.9 10/09/2022   Lab Results  Component Value Date   HGBA1C 6.1 (H) 10/09/2022   Lab Results  Component Value Date   VITAMINB12 503 10/09/2022   Lab Results  Component Value Date   TSH 4.030 10/09/2022     0 Result Notes     1 Patient Communication      Component Ref Range & Units 1 mo ago  Prolactin 4.8 - 23.3 ng/mL 46.0 High   Resulting Agency        IMPRESSION: 1. 9 x 7 mm central and right paracentral peripherally enhancing lesion within the pituitary gland compatible with a pituitary microadenoma. 2. Progressive periventricular and scattered subcortical T2 hyperintensities bilaterally. The finding is nonspecific but can be seen in the setting of chronic microvascular ischemia, a demyelinating process such as multiple sclerosis, vasculitis, complicated migraine headaches, or as the sequelae of a prior infectious or inflammatory process.     Electronically Signed   By: San Morelle M.D.   On: 11/07/2022 08:33     ASSESSMENT AND PLAN 51 y.o. year old female  has a past medical history of Abnormal EKG, Abnormal Pap smear of cervix (about 2013, 2016), Diabetes mellitus without complication ([place]),  DUB (dysfunctional uterine bleeding), Elevated hemoglobin A1c, Fibroid, Hypertension, Left ovarian cyst (05/2018), Migraine, OSA on CPAP, and Resistant hypertension (05/02/2021). here with:  Headaches and Prolactinemia- the  brain MRI followed, PCP and Gyn MD have cooperated.  NEWLY found Prolactinoma 9 by 7 mm - stared on Bromocriptine 11-10-2022. Marland Kitchen   She is on birthcontrol pills to regulate the menstrual cycle, but was amenorrhoeic for the last 4 cycles- that was also leading to evaluation - prolactin stopped her cycle. She needs referral for optic fileds( Visual fields ) and repeat Prolactin level after 45 days on meds, MRI repeat,    PLAN:  REFERRAL to ophthalmology for visual fields.  Referral for new PROLACTINLEVEL>  Referral for new brain MRI in February 2024. RV with me in March 2024.    Larey Seat, MD  11/20/2022, 11:14 AM [facility] [address], [place], [place] [address] [phone]     Addendum     1) recent sleep study, patient is to follow in the next months with NP millikan on CPAP compliance.  [facility]  at [employer] [place]   HOME SLEEP TEST REPORT ( by [facility])   STUDY DATE: 09-12-2022    ORDERING CLINICIAN: Ward Givens, NP REFERRING CLINICIAN: Larey Seat, MD    CLINICAL INFORMATION/HISTORY: Today 08/02/22:Ms. Dicola is a 51 year old female with a history of obstructive sleep apnea on CPAP.  She returns today for follow-up. Denies any new issues. DL is below. Would like a new machine. 08/02/21: Ms. Rickel is a 51 year old female with a history of obstructive sleep apnea on CPAP.  She returns today for follow-up.  Her download indicates good compliance.  She denies any new issues.  Reports that the CPAP is working well for her.  She returns today for follow-up.   CPAP set up date:  CPAP used at 8 cm H20, 2cm EPR and AHI 1.1/h.      Epworth sleepiness score: 2 /24.on CPAP   BMI: 32 kg/m   Neck  Circumference: NA                     Respiratory Indices:   Calculated pAHI (per hour):    47.1/h                         REM pAHI: 71.4/h                                               NREM pAHI:   40.4/h                           Positional AHI:     No data  Snoring: No data                                                IMPRESSION:  This HST confirms the presence of severe sleep apnea with REM sleep accentuation, frequent brief oxygen desaturations, and overall reduction of sleep time.   RECOMMENDATION: The patient will continue CPAP therapy with an interface of her choice, an auto titration CPAP device will be provided with a setting between 5 and 12 cm water pressure, 2 cm expiratory pressure relief, and heated humidification.    INTERPRETING PHYSICIAN:   Larey Seat, MD   Medical Director of [facility] at [facility].

## 2022-11-20 NOTE — Telephone Encounter (Signed)
[employer] sent to [facility] they obtain auth [phone] 

## 2022-11-20 NOTE — Patient Instructions (Signed)
Pituitary Tumors  Pituitary tumors are abnormal growths found in the pituitary gland. The pituitary gland is a pea-sized gland located in the base of the skull, behind the bridge of the nose. It makes hormones that affect growth and the functions of other glands in the body. In most cases, pituitary tumors grow slowly, are not cancerous (are benign), and do not spread to other parts of the body. These tumors are best treated when they are found and diagnosed early. A pituitary tumor may produce extra hormones (functioning tumor) or not (non-functioning tumor). A pituitary tumor may cause: Cushing disease. In this disease, the pituitary gland produces too much of a hormone called adrenocorticotropic hormone (ACTH). This hormone causes the adrenal glands to make high levels of another hormone called cortisol. This causes fat to build up in the face, back, and chest while the arms and legs become thin. Acromegaly. This is a condition in which the hands, feet, and face are larger than normal. Breast milk production, even when there is no pregnancy. What are the causes? The cause of most pituitary tumors is not known. In some cases, pituitary tumors may be passed from parent to child (inherited). What increases the risk? You are more likely to develop this condition if: You have a family history of pituitary tumors. You have certain syndromes caused by unwanted changes (mutations) in your genes. These include multiple endocrine neoplasia type 1 (MEN1) and 4 (MEN4), McCune-Albright syndrome, and Carney complex. What are the signs or symptoms? Symptoms depend on the size of the tumor and the specific hormones that a functioning tumor makes. Symptoms may include: Headaches. Seizures or loss of consciousness. Vision problems and eye muscle weakness. Weakness or low energy. Clear fluid draining from the nose. Changes in the sense of smell. Loss of body hair. Nausea and vomiting. Problems caused by the  production of too many hormones, such as: Inability to get pregnant after a year of having sex regularly without using birth control (infertility). Loss of menstrual periods in women. Abnormal growth. Diabetes insipidus or diabetes mellitus. High blood pressure (hypertension). Inability to tolerate heat or cold. Increase in sweating. Joint pain. Other skin and body changes. Nipple discharge. Changes in mood, or depression. Decreased sexual function. In some cases, there are no symptoms. How is this diagnosed? This condition may be diagnosed based on: A physical exam and your medical history. Blood or urine tests to check your hormone levels. Imaging tests such as a CT or MRI scan. Removal and examination of a small tumor tissue (biopsy). How is this treated? Treatment depends on the type of pituitary tumor you have and your overall health. Treatments may include: Surgical removal of the tumor. Using high doses of X-ray energy to kill tumor cells (radiation). Using certain medicines to stop the pituitary gland from producing too many hormones. Using certain medicines to kill abnormal or cancerous cells (chemotherapy) or to treat symptoms from the tumor. If you have a family history of pituitary tumors, you may need to have regular blood tests to monitor pituitary hormone levels. Follow these instructions at home: If directed, follow instructions from your health care provider about measuring how much urine you pass. Drink enough fluid to keep your urine pale yellow. If your nose is draining clear fluid, do not pick your nose or remove any crusting. Tell your health care provider if this condition worsens. Do not do any activities that require straining, such as heavy lifting. Ask your health care provider what activities are  safe for you. Take over-the-counter and prescription medicines only as told by your health care provider. Keep all follow-up visits. This is important, especially  if you have a family history of pituitary tumors and you need regular blood tests. Where to find more information [place]: www.cancer.gov Contact a health care provider if: You have sudden, unusual thirst. You are urinating more often than usual. You have a headache that does not go away. You develop new changes in your vision. You have new or increased clear fluid leaking from your nose or ears. You have a sensation of fluid trickling down the back of your throat. You have a salty taste in your mouth. You have trouble concentrating. Get help right away if: Your symptoms suddenly become severe. You have a nosebleed that does not stop after a few minutes. You have a fever of over 101F (38.3C). You have a severe headache. You have a stiff neck. You are confused or not as alert as usual. You have chest pain. You have shortness of breath. These symptoms may represent a serious problem that is an emergency. Do not wait to see if the symptoms will go away. Get medical help right away. Call your local emergency services (911 in the [place] Do not drive yourself to the hospital. Summary Pituitary tumors are abnormal growths found in the pituitary gland. Depending on the tumor type, this can cause your body to make excessive amounts of certain hormones. Symptoms of a pituitary tumor depend on the size of the tumor and the specific hormones that a functioning tumor makes. Some tumors may not cause symptoms. Treatment depends on the type of pituitary tumor you have and your overall health. Keep all follow-up visits. This is important, especially if you have a family history of pituitary tumors and you need regular blood tests. This information is not intended to replace advice given to you by your health care provider. Make sure you discuss any questions you have with your health care provider. Document Revised: 11/22/2020 Document Reviewed: 11/22/2020 Elsevier Patient Education   Melanie Little.

## 2022-11-20 NOTE — Telephone Encounter (Signed)
Referral for Ophthalmology faxed to [facility]. HYHOO:[phone], Fax: [phone]

## 2022-11-21 NOTE — Progress Notes (Signed)
[facility] PREP Weekly Session  Patient Details  Name: Melanie Little MRN: [id number] Date of Birth: August 20, 1971 Age: 51 y.o. PCP: [name], FNP  There were no vitals filed for this visit.   [facility] Weekly seesion - 11/21/22 0800       [facility] "PREP" Location   [facility]" [facility]      Weekly Session   Topic Discussed Eating for the season;Stress management and problem solving   reset goals   Minutes exercised this week 45 minutes    Classes attended to date [place] 11/21/2022, 8:53 AM

## 2022-11-27 ENCOUNTER — Other Ambulatory Visit (INDEPENDENT_AMBULATORY_CARE_PROVIDER_SITE_OTHER): Payer: Self-pay

## 2022-11-27 DIAGNOSIS — N912 Amenorrhea, unspecified: Secondary | ICD-10-CM

## 2022-11-27 DIAGNOSIS — Z79899 Other long term (current) drug therapy: Secondary | ICD-10-CM

## 2022-11-27 DIAGNOSIS — Z0289 Encounter for other administrative examinations: Secondary | ICD-10-CM

## 2022-11-27 DIAGNOSIS — G43711 Chronic migraine without aura, intractable, with status migrainosus: Secondary | ICD-10-CM

## 2022-11-27 DIAGNOSIS — D352 Benign neoplasm of pituitary gland: Secondary | ICD-10-CM

## 2022-11-28 ENCOUNTER — Other Ambulatory Visit: Payer: Self-pay | Admitting: *Deleted

## 2022-11-28 ENCOUNTER — Encounter: Payer: Self-pay | Admitting: Neurology

## 2022-11-28 DIAGNOSIS — D352 Benign neoplasm of pituitary gland: Secondary | ICD-10-CM

## 2022-11-28 DIAGNOSIS — N912 Amenorrhea, unspecified: Secondary | ICD-10-CM

## 2022-11-28 DIAGNOSIS — G43711 Chronic migraine without aura, intractable, with status migrainosus: Secondary | ICD-10-CM

## 2022-11-28 LAB — BASIC METABOLIC PANEL
BUN/Creatinine Ratio: 12 (ref 9–23)
BUN: 10 mg/dL (ref 6–24)
CO2: 23 mmol/L (ref 20–29)
Calcium: 9.1 mg/dL (ref 8.7–10.2)
Chloride: 104 mmol/L (ref 96–106)
Creatinine, Ser: 0.85 mg/dL (ref 0.57–1.00)
Glucose: 108 mg/dL — ABNORMAL HIGH (ref 70–99)
Potassium: 4.4 mmol/L (ref 3.5–5.2)
Sodium: 139 mmol/L (ref 134–144)
eGFR: 84 mL/min/{1.73_m2} (ref >59)

## 2022-11-29 LAB — HM DIABETES EYE EXAM

## 2022-12-05 ENCOUNTER — Other Ambulatory Visit: Payer: Managed Care, Other (non HMO)

## 2022-12-05 ENCOUNTER — Encounter: Payer: Self-pay | Admitting: Obstetrics and Gynecology

## 2022-12-05 ENCOUNTER — Ambulatory Visit: Payer: Managed Care, Other (non HMO) | Admitting: Obstetrics and Gynecology

## 2022-12-05 ENCOUNTER — Other Ambulatory Visit: Payer: Self-pay

## 2022-12-05 VITALS — BP 130/64 | HR 77 | Ht 64.0 in | Wt 195.8 lb

## 2022-12-05 DIAGNOSIS — D252 Subserosal leiomyoma of uterus: Secondary | ICD-10-CM

## 2022-12-05 DIAGNOSIS — Z3041 Encounter for surveillance of contraceptive pills: Secondary | ICD-10-CM | POA: Diagnosis not present

## 2022-12-05 DIAGNOSIS — G43711 Chronic migraine without aura, intractable, with status migrainosus: Secondary | ICD-10-CM

## 2022-12-05 DIAGNOSIS — D250 Submucous leiomyoma of uterus: Secondary | ICD-10-CM

## 2022-12-05 DIAGNOSIS — N912 Amenorrhea, unspecified: Secondary | ICD-10-CM

## 2022-12-05 DIAGNOSIS — D251 Intramural leiomyoma of uterus: Secondary | ICD-10-CM | POA: Diagnosis not present

## 2022-12-05 DIAGNOSIS — D352 Benign neoplasm of pituitary gland: Secondary | ICD-10-CM

## 2022-12-05 MED ORDER — NORETHINDRONE 0.35 MG PO TABS: ORAL_TABLET | ORAL | 4 refills | Status: DC

## 2022-12-05 NOTE — Progress Notes (Unsigned)
PATIENT: Melanie Little DOB: [birth year]  REASON FOR VISIT: follow up HISTORY FROM: patient PRIMARY NEUROLOGIST: Dr. Vickey Huger  Chief Complaint  Patient presents with   Follow-up    Rm 19, alone.  Cpap ok.       HISTORY OF PRESENT ILLNESS: Today 12/06/22:  Melanie Little is a 51 year old female with a history of obstructive sleep apnea on CPAP.  She returns today for follow-up.  She received a new machine however she is upset that it is the same model that she had.  Reports that her DME company told her that the newest model is hard to obtain.  She denies any new issues with the CPAP.  Her download is below    REVIEW OF SYSTEMS: Out of a complete 14 system review of symptoms, the patient complains only of the following symptoms, and all other reviewed systems are negative.  FSS 9  ESS 2  ALLERGIES: Allergies  Allergen Reactions   Sulfamethoxazole-Trimethoprim Other (See Comments), Hives, Itching and Swelling    HOME MEDICATIONS: Outpatient Medications Prior to Visit  Medication Sig Dispense Refill   amLODipine (NORVASC) 5 MG tablet Take 1 tablet (5 mg total) by mouth daily. 90 tablet 3   Blood Glucose Monitoring Suppl (ONE TOUCH ULTRA MINI) w/Device KIT 1 each by Does not apply route 3 (three) times daily as needed. 1 kit 1   EPINEPHrine 0.3 mg/0.3 mL IJ SOAJ injection Inject 0.3 mLs into the muscle as needed. For Anaphylaxis     glucose blood test strip Use as instructed 100 each 12   hydrALAZINE (APRESOLINE) 25 MG tablet Take 1 tablet (25 mg total) by mouth 2 (two) times daily. 180 tablet 1   hydrALAZINE (APRESOLINE) 50 MG tablet Take 50 mg by mouth daily.     Lancets (ONETOUCH ULTRASOFT) lancets Use as instructed 100 each 12   nebivolol (BYSTOLIC) 10 MG tablet Take 1 tablet (10 mg total) by mouth daily. 30 tablet 5   norethindrone (NORLYDA) 0.35 MG tablet TAKE 1 TABLET(0.35 MG) BY MOUTH DAILY 84 tablet 4   Semaglutide, 2 MG/DOSE, (OZEMPIC, 2 MG/DOSE,) 8 MG/3ML SOPN  Inject 2 mg into the skin once a week. 3 mL 5   spironolactone (ALDACTONE) 25 MG tablet Take 1 tablet (25 mg total) by mouth daily. 90 tablet 3   valsartan (DIOVAN) 160 MG tablet Take 1 tablet (160 mg total) by mouth daily. 90 tablet 3   Vitamin D, Ergocalciferol, (DRISDOL) 1.25 MG (50000 UNIT) CAPS capsule Take 1 capsule (50,000 Units total) by mouth every 7 (seven) days. 12 capsule 1   Semaglutide, 1 MG/DOSE, 4 MG/3ML SOPN Inject 1 mg into the skin once a week. (Patient not taking: Reported on 12/06/2022) 3 mL 3   No facility-administered medications prior to visit.    PAST MEDICAL HISTORY: Past Medical History:  Diagnosis Date   Abnormal EKG    Abnormal Pap smear of cervix about 2013, 2016   Colpo biopsy with normal result per pt.  colpo 2016 HPV changes   Diabetes mellitus without complication (HCC)    DUB (dysfunctional uterine bleeding)    around 2012, saw gyn at the time for eval   Elevated hemoglobin A1c    Fibroid    multiple fibroids   Hypertension    Left ovarian cyst 05/2018   do yearly ultrasound.  Doing GYN ONC consulation for monitoring.    Migraine    OSA on CPAP    Resistant hypertension 05/02/2021  PAST SURGICAL HISTORY: Past Surgical History:  Procedure Laterality Date   BREAST REDUCTION SURGERY  2005   CHOLECYSTECTOMY  2004   INCISION AND DRAINAGE Bilateral 2015   Bilateral axillary region ebaceous cyst   IR RADIOLOGIST EVAL & MGMT  09/14/2022   IR RADIOLOGIST EVAL & MGMT  11/08/2022   MASS EXCISION N/A 12/02/2020   Procedure: EXCISION OF TWO MIDLINE BACK SUBCUTANEOUS MASSES;  Surgeon: Quentin Ore, MD;  Location: [facility];  Service: General;  Laterality: N/A;   REDUCTION MAMMAPLASTY Bilateral 2005    FAMILY HISTORY: Family History  Problem Relation Age of Onset   CVA Mother 40       deceased, was 56 at the age of her first stroke   Diabetes Father    Deep vein thrombosis Father    Breast cancer Maternal Aunt    Breast  cancer Maternal Aunt    Sleep apnea Neg Hx     SOCIAL HISTORY: Social History   Socioeconomic History   Marital status: Married    Spouse name: Not on file   Number of children: 1   Years of education: Not on file   Highest education level: Not on file  Occupational History   Occupation: [employer]  Tobacco Use   Smoking status: Never   Smokeless tobacco: Never  Vaping Use   Vaping Use: Never used  Substance and Sexual Activity   Alcohol use: No    Alcohol/week: 0.0 standard drinks of alcohol   Drug use: No   Sexual activity: Yes    Birth control/protection: Pill    Comment: Micronor  Other Topics Concern   Not on file  Social History Narrative   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[employer]      Home Situation: lives with husband      Spiritual Beliefs: [employer]      Lifestyle: walks every other day; diet is bad      Social Determinants of Health   Financial Resource Strain: Low Risk  (03/01/2022)   Overall Financial Resource Strain (CARDIA)    Difficulty of Paying Living Expenses: Not hard at all  Food Insecurity: No Food Insecurity (03/01/2022)   Hunger Vital Sign    Worried About Running Out of Food in the Last Year: Never true    Ran Out of Food in the Last Year: Never true  Transportation Needs: No Transportation Needs (03/01/2022)   PRAPARE - Administrator, Civil Service (Medical): No    Lack of Transportation (Non-Medical): No  Physical Activity: Inactive (03/01/2022)   Exercise Vital Sign    Days of Exercise per Week: 0 days    Minutes of Exercise per Session: 0 min  Stress: Not on file  Social Connections: Not on file  Intimate Partner Violence: Not on file      PHYSICAL EXAM  Vitals:   12/06/22 0840  BP: 118/72  Pulse: 70  Weight: 193 lb 6.4 oz (87.7 kg)  Height: 5\' 4"  (1.626 m)   Body mass index is 33.2 kg/m.  Generalized: Well developed, in no acute distress  Chest: Lungs  clear to auscultation bilaterally  Neurological examination  Mentation: Alert oriented to time, place, history taking. Follows all commands speech and language fluent Cranial nerve II-XII: Extraocular movements were full, visual field were full on confrontational test Head turning and shoulder shrug  were normal and symmetric. Gait and station: Gait is normal.    DIAGNOSTIC  DATA (LABS, IMAGING, TESTING) - I reviewed patient records, labs, notes, testing and imaging myself where available.  Lab Results  Component Value Date   WBC 8.9 10/09/2022   HGB 12.7 10/09/2022   HCT 38.6 10/09/2022   MCV 68 (L) 10/09/2022   PLT 435 10/09/2022      Component Value Date/Time   NA 139 11/27/2022 1137   K 4.4 11/27/2022 1137   CL 104 11/27/2022 1137   CO2 23 11/27/2022 1137   GLUCOSE 108 (H) 11/27/2022 1137   GLUCOSE 129 (H) 11/30/2020 0945   BUN 10 11/27/2022 1137   CREATININE 0.85 11/27/2022 1137   CREATININE 0.70 11/09/2020 1514   CALCIUM 9.1 11/27/2022 1137   PROT 7.5 10/09/2022 1649   ALBUMIN 4.9 10/09/2022 1649   AST 14 10/09/2022 1649   ALT 13 10/09/2022 1649   ALKPHOS 70 10/09/2022 1649   BILITOT 1.4 (H) 10/09/2022 1649   GFRNONAA >60 11/30/2020 0945   GFRNONAA >89 04/30/2017 1529   GFRAA >60 02/25/2019 1420   GFRAA >89 04/30/2017 1529   Lab Results  Component Value Date   CHOL 188 10/09/2022   HDL 48 10/09/2022   LDLCALC 107 (H) 10/09/2022   TRIG 190 (H) 10/09/2022   CHOLHDL 3.9 10/09/2022   Lab Results  Component Value Date   HGBA1C 6.1 (H) 10/09/2022   Lab Results  Component Value Date   VITAMINB12 503 10/09/2022   Lab Results  Component Value Date   TSH 4.030 10/09/2022      ASSESSMENT AND PLAN 51 y.o. year old female  has a past medical history of Abnormal EKG, Abnormal Pap smear of cervix (about 2013, 2016), Diabetes mellitus without complication (HCC), DUB (dysfunctional uterine bleeding), Elevated hemoglobin A1c, Fibroid, Hypertension, Left ovarian  cyst (05/2018), Migraine, OSA on CPAP, and Resistant hypertension (05/02/2021). here with:  OSA on CPAP  - CPAP compliance excellent - Good treatment of AHI  - Encourage patient to use CPAP nightly and > 4 hours each night - F/U in 1 year or sooner if needed    Butch Penny, MSN, NP-C 12/06/2022, 8:58 AM [facility] [address], [address] [place], [place] [address] [phone]

## 2022-12-05 NOTE — Progress Notes (Signed)
Weight 12/5 correction

## 2022-12-05 NOTE — Progress Notes (Deleted)
[facility] PREP Weekly Session  Patient Details  Name: ROSANNA BICKLE MRN: [id number] Date of Birth: 28-Nov-1971 Age: 51 y.o. PCP: [name], FNP  There were no vitals filed for this visit.   [facility] Weekly seesion - 12/05/22 1200       [facility] "PREP" Location   [facility] "PREP" Location [facility]      Weekly Session   Topic Discussed --   Portions   Minutes exercised this week 30 minutes    Classes attended to date [place] 12/05/2022, 12:07 PM

## 2022-12-05 NOTE — Patient Instructions (Addendum)
[place] = uterine artery embolization  Medications that can lesson bleeding  Lupron (leuprolide acetate) is the injection that can shrink fibroids and medical menopause   Myfembree is a pill that can help with fibroids and bleeding and pain   Most definitive is a hysterectomy   Can refill your birth control

## 2022-12-05 NOTE — Progress Notes (Signed)
[facility] PREP Weekly Session  Patient Details  Name: Melanie Little MRN: [id number] Date of Birth: 12-Dec-1971 Age: 51 y.o. PCP: [name], FNP  Vitals:   12/04/22 1800  Weight: 206 lb 3.2 oz (93.5 kg)     [facility] Weekly seesion - 12/05/22 1200       [facility] "PREP" Location   [facility] "PREP" Location [facility]      Weekly Session   Topic Discussed --   Portions   Minutes exercised this week 30 minutes    Classes attended to date [place] 12/05/2022, 12:08 PM

## 2022-12-05 NOTE — Progress Notes (Signed)
GYNECOLOGY VISIT  Patient name: Melanie Little MRN [id number]  Date of birth: 1971/06/15 Chief Complaint:   Fibroids   History:  Melanie Little is a 51 y.o. [phone] being seen today for fibroid management.  Wanting to establish care. Having blood clots. Gets migraines 1 week prior. Curerntly following with neurology for pituitary adenoma. Menses currently heavy and painful. Does not want a hysterectomy. Originally scheduled for [place] but cancelled procedure as she does not want a procedure that may not work. Currently on POP for contraception. Interested in therapies that will shrink fibroids. Currently has increased abdominal size due to fibroids and bleeding. No pain with intercourse though has decreased desire due to multiple medical issues being dealt with at this time. Currently on ozempic for pre-DM.  Past Medical History:  Diagnosis Date   Abnormal EKG    Abnormal Pap smear of cervix about 2013, 2016   Colpo biopsy with normal result per pt.  colpo 2016 HPV changes   Diabetes mellitus without complication ([place])    DUB (dysfunctional uterine bleeding)    around 2012, saw gyn at the time for eval   Elevated hemoglobin A1c    Fibroid    multiple fibroids   Hypertension    Left ovarian cyst 05/2018   do yearly ultrasound.  Doing GYN ONC consulation for monitoring.    Migraine    OSA on CPAP    Resistant hypertension 05/02/2021    Past Surgical History:  Procedure Laterality Date   BREAST REDUCTION SURGERY  2005   CHOLECYSTECTOMY  2004   INCISION AND DRAINAGE Bilateral 2015   Bilateral axillary region ebaceous cyst   IR RADIOLOGIST EVAL & MGMT  09/14/2022   IR RADIOLOGIST EVAL & MGMT  11/08/2022   MASS EXCISION N/A 12/02/2020   Procedure: EXCISION OF TWO MIDLINE BACK SUBCUTANEOUS MASSES;  Surgeon: Felicie Morn, MD;  Location: [place] [place];  Service: General;  Laterality: N/A;   REDUCTION MAMMAPLASTY Bilateral 2005    The following portions of the  patient's history were reviewed and updated as appropriate: allergies, current medications, past family history, past medical history, past social history, past surgical history and problem list.   Health Maintenance:   Last pap 09/2022. Results were: NILM w/ HRHPV not done. H/O abnormal pap: no Last mammogram: 03/2022. Results were: normal. Family h/o breast cancer: no   Review of Systems:  Pertinent items are noted in HPI. Comprehensive review of systems was otherwise negative.   Objective:  Physical Exam BP 130/64   Pulse 77   Ht '5\' 4"'$  (1.626 m)   Wt 195 lb 12.8 oz (88.8 kg)   LMP 11/21/2022 (Exact Date)   BMI 33.61 kg/m    Physical Exam Vitals and nursing note reviewed.  Constitutional:      Appearance: Normal appearance.  HENT:     Head: Normocephalic and atraumatic.  Cardiovascular:     Rate and Rhythm: Normal rate and regular rhythm.  Pulmonary:     Effort: Pulmonary effort is normal.     Breath sounds: Normal breath sounds.  Abdominal:     Palpations: Abdomen is soft.     Comments: Enlarged uterus, ~16 wk and mildly tender  Skin:    General: Skin is warm and dry.  Neurological:     General: No focal deficit present.     Mental Status: She is alert.  Psychiatric:        Mood and Affect: Mood normal.  Behavior: Behavior normal.        Thought Content: Thought content normal.        Judgment: Judgment normal.      Labs and Imaging IR Radiologist Eval & Mgmt  Result Date: 11/08/2022 Please refer to notes tab for details about interventional procedure. (Op Note)      Assessment & Plan:   1. Intramural, submucous, and subserous leiomyoma of uterus Discussed management options for abnormal uterine bleeding including NSAIDs (Naproxen), tranexamic acid (Lysteda), oral progesterone, Depo Provera, Levonogestrel IUD, endometrial ablation or hysterectomy as definitive surgical management.  Discussed risks and benefits of each method.     Contraindications  to [facility]. Does not want hysterectomy at this time. Going to look more into GnRH agonist and antagonist. Going to get prolactinoma dealt with and continue POP for now. Will touch base in 3-4 months about her bleeding and if any changes with the treatments she is undergoing for other co morbidities.       Routine preventative health maintenance measures emphasized.  Darliss Cheney, MD [facility] [place]

## 2022-12-06 ENCOUNTER — Encounter: Payer: Self-pay | Admitting: Adult Health

## 2022-12-06 ENCOUNTER — Ambulatory Visit: Payer: Managed Care, Other (non HMO) | Admitting: Adult Health

## 2022-12-06 VITALS — BP 118/72 | HR 70 | Ht 64.0 in | Wt 193.4 lb

## 2022-12-06 DIAGNOSIS — G4733 Obstructive sleep apnea (adult) (pediatric): Secondary | ICD-10-CM

## 2022-12-06 LAB — PROLACTIN: Prolactin: 19.9 ng/mL (ref 4.8–23.3)

## 2022-12-06 LAB — FOLLICLE STIMULATING HORMONE: FSH: 5.5 m[IU]/mL

## 2022-12-06 LAB — LUTEINIZING HORMONE: LH: 6.2 m[IU]/mL

## 2022-12-06 NOTE — Progress Notes (Signed)
Spoke w/ MM,NP. She spoke with pt about labs at appt this morning.

## 2022-12-11 ENCOUNTER — Other Ambulatory Visit (HOSPITAL_BASED_OUTPATIENT_CLINIC_OR_DEPARTMENT_OTHER): Payer: Self-pay | Admitting: Cardiovascular Disease

## 2022-12-11 NOTE — Telephone Encounter (Signed)
Rx request sent to pharmacy.  

## 2022-12-19 NOTE — Progress Notes (Signed)
[facility] PREP Weekly Session  Patient Details  Name: Melanie Little MRN: [id number] Date of Birth: 10-05-1971 Age: 51 y.o. PCP: [name], FNP  Vitals:   12/18/22 1800  Weight: 190 lb 9.6 oz (86.5 kg)     [facility] Weekly seesion - 12/19/22 1500       [facility] "PREP" Location   [facility] "PREP" Location [facility]      Weekly Session   Topic Discussed Calorie breakdown    Minutes exercised this week 55 minutes    Classes attended to date 13             [name] 12/19/2022, 4:00 PM

## 2022-12-25 ENCOUNTER — Ambulatory Visit (HOSPITAL_COMMUNITY): Payer: Managed Care, Other (non HMO)

## 2022-12-25 ENCOUNTER — Other Ambulatory Visit (HOSPITAL_COMMUNITY): Payer: Managed Care, Other (non HMO)

## 2023-01-10 ENCOUNTER — Other Ambulatory Visit: Payer: Self-pay | Admitting: Nurse Practitioner

## 2023-01-17 NOTE — Progress Notes (Signed)
[facility] PREP Evaluation  Patient Details  Name: ERNA BROSSARD MRN: [id number] Date of Birth: April 18, 1971 Age: 52 y.o. PCP: [name], FNP  Vitals:   01/17/23 1739  BP: 138/78  Pulse: 78  SpO2: 99%  Weight: 192 lb 9.6 oz (87.4 kg)     [facility] Eval - 01/17/23 1700       [facility] "PREP" Location   [facility] "PREP" Location [facility]      Referral    Referring Provider [employer] Start Date --   finish date 01/17/23     Measurement   Waist Circumference 42 inches    Hip Circumference 43.5 inches    Body fat 39.9 percent      Mobility and Daily Activities   I find it easy to walk up or down two or more flights of stairs. 4    I have no trouble taking out the trash. 4    I do housework such as vacuuming and dusting on my own without difficulty. 4    I can easily lift a gallon of milk (8lbs). 4    I can easily walk a mile. 3    I have no trouble reaching into high cupboards or reaching down to pick up something from the floor. 3    I do not have trouble doing out-door work such as Armed forces logistics/support/administrative officer, raking leaves, or gardening. 3      Mobility and Daily Activities   I feel younger than my age. 1    I feel independent. 4    I feel energetic. 2    I live an active life.  4    I feel strong. 3    I feel healthy. 2    I feel active as other people my age. 2      How fit and strong are you.   Fit and Strong Total Score 43            Past Medical History:  Diagnosis Date   Abnormal EKG    Abnormal Pap smear of cervix about 2013, 2016   Colpo biopsy with normal result per pt.  colpo 2016 HPV changes   Diabetes mellitus without complication ([place])    DUB (dysfunctional uterine bleeding)    around 2012, saw gyn at the time for eval   Elevated hemoglobin A1c    Fibroid    multiple fibroids   Hypertension    Left ovarian cyst 05/2018   do yearly ultrasound.  Doing GYN ONC consulation for monitoring.    Migraine    OSA on CPAP    Resistant hypertension 05/02/2021    Past Surgical History:  Procedure Laterality Date   BREAST REDUCTION SURGERY  2005   CHOLECYSTECTOMY  2004   INCISION AND DRAINAGE Bilateral 2015   Bilateral axillary region ebaceous cyst   IR RADIOLOGIST EVAL & MGMT  09/14/2022   IR RADIOLOGIST EVAL & MGMT  11/08/2022   MASS EXCISION N/A 12/02/2020   Procedure: EXCISION OF TWO MIDLINE BACK SUBCUTANEOUS MASSES;  Surgeon: Felicie Morn, MD;  Location: [place];  Service: General;  Laterality: N/A;   REDUCTION MAMMAPLASTY Bilateral 2005   Social History   Tobacco Use  Smoking Status Never  Smokeless Tobacco Never  Attended >16 workouts, 10 educational sessions of 12 Fit testing: Cardio march: 288 to 289 Sit to stand: 14 to 20 Bicep curl: 22 to 26 Balance remains good Talked about ways to fit  in fitness throughout the day or at home due to long work days   Barnett Hatter 01/17/2023, 5:42 PM

## 2023-01-29 ENCOUNTER — Encounter: Payer: Self-pay | Admitting: Nurse Practitioner

## 2023-01-29 ENCOUNTER — Other Ambulatory Visit: Payer: Self-pay | Admitting: Nurse Practitioner

## 2023-01-29 DIAGNOSIS — E1169 Type 2 diabetes mellitus with other specified complication: Secondary | ICD-10-CM

## 2023-01-30 ENCOUNTER — Other Ambulatory Visit: Payer: Self-pay

## 2023-01-30 DIAGNOSIS — E1169 Type 2 diabetes mellitus with other specified complication: Secondary | ICD-10-CM

## 2023-01-30 MED ORDER — OZEMPIC (2 MG/DOSE) 8 MG/3ML ~~LOC~~ SOPN: 2.0000 mg | PEN_INJECTOR | SUBCUTANEOUS | 5 refills | Status: DC

## 2023-02-04 ENCOUNTER — Encounter (HOSPITAL_BASED_OUTPATIENT_CLINIC_OR_DEPARTMENT_OTHER): Payer: Self-pay | Admitting: Cardiovascular Disease

## 2023-02-04 ENCOUNTER — Ambulatory Visit (HOSPITAL_BASED_OUTPATIENT_CLINIC_OR_DEPARTMENT_OTHER): Payer: Managed Care, Other (non HMO) | Admitting: Cardiovascular Disease

## 2023-02-04 VITALS — BP 127/77 | HR 71 | Ht 64.0 in | Wt 192.0 lb

## 2023-02-04 DIAGNOSIS — I1A0 Resistant hypertension: Secondary | ICD-10-CM | POA: Diagnosis not present

## 2023-02-04 NOTE — Progress Notes (Signed)
Advanced [facility]pertension Clinic:    Date:  02/04/2023   ID:  Melanie Little, DOB December 24, 1971, MRN [id number]  PCP:  Melanie Brine, FNP  Cardiologist:  None  Nephrologist:  Referring MD: Melanie Brine, FNP   CC: Hypertension  History of Present Illness:    Melanie Little is a 52 y.o. female with a hx of hypertension, diabetes, and OSA on CPAP, here for follow up.  She was first seen 02/2022 to establish care in the [facility]. She saw Melanie Brine, NP on 12/2020 and her BP was 160/120. She previously had renal artery Dopplers 10/2017 which were normal. She had a CT of the Abdomen/Pelvis 01/2019 which showed no a adrenal adenomas.  Valsartan/HCTZ was increased 02/20/22.  She reported first developing hypertension in her 86s.  It became more difficult to control over the preceding 3 years.  In 01/2022 she was started on hydralazine. Then the Valsartan/HCTZ was added, and losartan was stopped.  She was started on amlodipine with plans to transition to [employer].  She followed up with our pharmacist 03/2022 and hydralazine was increased.  She had hypokalemia and her creatinine increased.  At follow-up she reported fatigue and was concerned about her renal function.  She had increasing edema so amlodipine was reduced.  HCTZ was discontinued and she was switched to valsartan and spironolactone.  Hydralazine was reduced to 25 mg in the morning and afternoon and continued to 50 mg in the evening.  She continued to have edema and amlodipine was discontinued.  At her last appointment 06/2022 she was feeling well and her blood pressures were better controlled.  She participated in the PREP program at the [facility].  She recently commenced a burn boot camp regimen. She confirms that she has not experienced any chest pain or pressure during her exercise sessions.  Overall she feels well.   The patient has been monitoring her blood pressure at home, noting stable readings around 128/80. She expresses  interest in discussing the potential for reducing her blood pressure medication in the future.  Additionally, [name] describes persistent aching and tingling in her legs, particularly noticeable at night. She also has a history of chronic lower back pain, which she currently manages with the use of a TENS unit.  Previous antihypertensives: Amlodipine - Ankle/hand edema Labetalol Carvedilol spironolactone   Past Medical History:  Diagnosis Date   Abnormal EKG    Abnormal Pap smear of cervix about 2013, 2016   Colpo biopsy with normal result per pt.  colpo 2016 HPV changes   Diabetes mellitus without complication ([place])    DUB (dysfunctional uterine bleeding)    around 2012, saw gyn at the time for eval   Elevated hemoglobin A1c    Fibroid    multiple fibroids   Hypertension    Left ovarian cyst 05/2018   do yearly ultrasound.  Doing GYN ONC consulation for monitoring.    Migraine    OSA on CPAP    Resistant hypertension 05/02/2021    Past Surgical History:  Procedure Laterality Date   BREAST REDUCTION SURGERY  2005   CHOLECYSTECTOMY  2004   INCISION AND DRAINAGE Bilateral 2015   Bilateral axillary region ebaceous cyst   IR RADIOLOGIST EVAL & MGMT  09/14/2022   IR RADIOLOGIST EVAL & MGMT  11/08/2022   MASS EXCISION N/A 12/02/2020   Procedure: EXCISION OF TWO MIDLINE BACK SUBCUTANEOUS MASSES;  Surgeon: Felicie Morn, MD;  Location: [facility];  Service: General;  Laterality: N/A;   REDUCTION MAMMAPLASTY Bilateral 2005    Current Medications: Current Meds  Medication Sig   amLODipine (NORVASC) 5 MG tablet Take 1 tablet (5 mg total) by mouth daily.   Blood Glucose Monitoring Suppl (ONE TOUCH ULTRA MINI) w/Device KIT 1 each by Does not apply route 3 (three) times daily as needed.   EPINEPHrine 0.3 mg/0.3 mL IJ SOAJ injection Inject 0.3 mLs into the muscle as needed. For Anaphylaxis   glucose blood test strip Use as instructed   hydrALAZINE (APRESOLINE)  25 MG tablet TAKE 1 TABLET(25 MG) BY MOUTH TWICE DAILY   hydrALAZINE (APRESOLINE) 50 MG tablet Take 50 mg by mouth daily at 12 noon.   Lancets (ONETOUCH ULTRASOFT) lancets Use as instructed   nebivolol (BYSTOLIC) 10 MG tablet Take 1 tablet (10 mg total) by mouth daily.   norethindrone (NORLYDA) 0.35 MG tablet TAKE 1 TABLET(0.35 MG) BY MOUTH DAILY   Semaglutide, 2 MG/DOSE, (OZEMPIC, 2 MG/DOSE,) 8 MG/3ML SOPN Inject 2 mg into the skin once a week.   spironolactone (ALDACTONE) 25 MG tablet Take 1 tablet (25 mg total) by mouth daily.   valsartan (DIOVAN) 160 MG tablet Take 1 tablet (160 mg total) by mouth daily.     Allergies:   Sulfamethoxazole-trimethoprim   Social History   Socioeconomic History   Marital status: Married    Spouse name: Not on file   Number of children: 1   Years of education: Not on file   Highest education level: Not on file  Occupational History   Occupation: [employer]  Tobacco Use   Smoking status: Never   Smokeless tobacco: Never  Vaping Use   Vaping Use: Never used  Substance and Sexual Activity   Alcohol use: No    Alcohol/week: 0.0 standard drinks of alcohol   Drug use: No   Sexual activity: Yes    Birth control/protection: Pill    Comment: Micronor  Other Topics Concern   Not on file  Social History Narrative   Work or School: Education officer, 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[place]      Home Situation: lives with husband      Spiritual Beliefs: Baptist      Lifestyle: walks every other day; diet is bad      Social Determinants of Health   Financial Resource Strain: Low Risk  (03/01/2022)   Overall Financial Resource Strain (CARDIA)    Difficulty of Paying Living Expenses: Not hard at all  Food Insecurity: No Food Insecurity (03/01/2022)   Hunger Vital Sign    Worried About Running Out of Food in the Last Year: Never true    [place] in the Last Year: Never true  Transportation Needs: No Transportation Needs  (03/01/2022)   PRAPARE - Hydrologist (Medical): No    Lack of Transportation (Non-Medical): No  Physical Activity: Inactive (03/01/2022)   Exercise Vital Sign    Days of Exercise per Week: 0 days    Minutes of Exercise per Session: 0 min  Stress: Not on file  Social Connections: Not on file     Family History: The patient's family history includes Breast cancer in her maternal aunt and maternal aunt; CVA (age of onset: 30) in her mother; Deep vein thrombosis in her father; Diabetes in her father. There is no history of Sleep apnea.  ROS:   Please see the history of present illness.    (+) Aching pain in  chest (+) Shortness of breath (+) Headaches (+) Fatigue All other systems reviewed and are negative.  EKGs/Labs/Other Studies Reviewed:    Lexiscan Myoview 01/31/2021: Nuclear stress EF: 50%. Blood pressure demonstrated a hypertensive response to exercise. ST segment elevation was noted during stress in the aVR leads. The study is normal. This is a low risk study. The left ventricular ejection fraction is mildly decreased (45-54%).   Very elevated blood pressure, initially 207/110 and then 235/117 after lexiscan. Concerning ECG changes with aVR elevation and horizontal ST depressions as noted. However, normal perfusion without evidence of ischemia. The EF calculates as low normal but visually appears normal.  Echo 01/31/2021:  1. Left ventricular ejection fraction, by estimation, is 60 to 65%. The  left ventricle has normal function. The left ventricle has no regional  wall motion abnormalities. There is moderate left ventricular hypertrophy.  Left ventricular diastolic  parameters were normal. The average left ventricular global longitudinal  strain is -18.9 %. The global longitudinal strain is normal.   2. Right ventricular systolic function is normal. The right ventricular  size is normal. There is normal pulmonary artery systolic pressure. The   estimated right ventricular systolic pressure is 36.1 mmHg.   3. The mitral valve is normal in structure. No evidence of mitral valve  regurgitation.   4. The aortic valve is tricuspid. Aortic valve regurgitation is not  visualized.   5. Aortic dilatation noted. There is borderline dilatation of the  ascending aorta, measuring 38 mm.   6. The inferior vena cava is normal in size with <50% respiratory  variability, suggesting right atrial pressure of 8 mmHg.   Comparison(s): Prior images unable to be directly viewed, comparison made by report only. Changes from prior study are noted. 11/04/2017: LVEF 60% with moderate to severe LVH.   CT Abdomen/Pelvis 02/26/2019: FINDINGS: Lower Chest: No acute findings.   Hepatobiliary: No hepatic masses identified. Mild diffuse hepatic steatosis noted. Prior cholecystectomy. No evidence of biliary obstruction.   Pancreas:  No mass or inflammatory changes.   Spleen: Within normal limits in size and appearance.   Adrenals/Urinary Tract: No masses identified. No evidence of hydronephrosis.   Stomach/Bowel: No evidence of obstruction, inflammatory process or abnormal fluid collections. Normal appendix visualized.   Vascular/Lymphatic: No pathologically enlarged lymph nodes. No abdominal aortic aneurysm.   Reproductive: Multiple uterine fibroids are seen, largest measuring approximately 3.5 cm. A cystic lesion with a few thin septations and somewhat tubular appearance is seen in the left adnexa which measures 5.0 x 3.1 x 4.2 cm. This is similar in size in appearance compared to previous ultrasound. Differential diagnosis includes cystic ovarian neoplasm and hydrosalpinx. No definite features of malignancy identified. No evidence of ascites.   Other:  None.   Musculoskeletal:  No suspicious bone lesions identified.   IMPRESSION: 1. 5 cm cystic lesion in left adnexa with thin septations and somewhat tubular appearance. Differential diagnosis  includes cystic ovarian neoplasm and hydrosalpinx. Consider pelvic MRI without and with contrast for further characterization. 2. Multiple uterine fibroids, largest measuring 3.5 cm. 3. Mild hepatic steatosis.  Bilateral Renal Artery Dopplers 11/15/2017: FINAL INTERPRETATION     Largest Aortic Diameter     1.4 cm     Renal  No evidence of renal artery stenosis, bilaterally.     Tortuous renal arteries, bilaterally.  Normal and symmetrical kidney size.  Normal cortical thickness.  No evidence of SMA or celiac artery stenosis.  Patent renal veins and IVC.   EKG:  EKG  is personally reviewed. 02/04/23: Sinus rhythm.  Rate 71 bpm.    Recent Labs: 10/09/2022: ALT 13; Hemoglobin 12.7; Platelets 435; TSH 4.030 11/27/2022: BUN 10; Creatinine, Ser 0.85; Potassium 4.4; Sodium 139   Recent Lipid Panel    Component Value Date/Time   CHOL 188 10/09/2022 1649   TRIG 190 (H) 10/09/2022 1649   HDL 48 10/09/2022 1649   CHOLHDL 3.9 10/09/2022 1649   CHOLHDL 3.8 01/29/2020 0821   VLDL 30.4 12/03/2016 1233   LDLCALC 107 (H) 10/09/2022 1649   LDLCALC 105 (H) 01/29/2020 0821    Physical Exam:    VS:  BP 127/77 (BP Location: Left Arm, Patient Position: Sitting, Cuff Size: Large)   Pulse 71   Ht '5\' 4"'$  (1.626 m)   Wt 192 lb (87.1 kg)   BMI 32.96 kg/m  , BMI Body mass index is 32.96 kg/m. GENERAL:  Well appearing HEENT: Pupils equal round and reactive, fundi not visualized, oral mucosa unremarkable NECK:  No jugular venous distention, waveform within normal limits, carotid upstroke brisk and symmetric, no bruits, no thyromegaly LUNGS:  Clear to auscultation bilaterally HEART:  RRR.  PMI not displaced or sustained,S1 and S2 within normal limits, no S3, no S4, no clicks, no rubs, no murmurs ABD:  Flat, positive bowel sounds normal in frequency in pitch, no bruits, no rebound, no guarding, no midline pulsatile mass, no hepatomegaly, no splenomegaly EXT:  2 plus pulses throughout, no edema,  no cyanosis no clubbing SKIN:  No rashes no nodules NEURO:  Cranial nerves II through XII grossly intact, motor grossly intact throughout PSYCH:  Cognitively intact, oriented to person place and time  ASSESSMENT/PLAN:    # Hypertension:  - Continue current antihypertensive medications.  Shew could like to reduce her regimen.  Given that her BP at the upper limit of control, we won't make any changes at this time.  Continue amlodipine, hydralazine, nebivolol, spironolactone and valsartan. - Encourage regular home blood pressure monitoring - Reassess in six months for potential medication adjustment if blood pressure is consistently below 130/80 - Continue with regular exercise for at least 150 minutes weekly  # OSA: Continue CPAP.  Screening for Secondary Hypertension:     03/01/2022    9:07 AM  Causes  Drugs/Herbals Screened     - Comments High sodium, rare EtOH, no caffeine, rare NSAIDs  Renovascular HTN Screened     - Comments Renal Doppler negative 2018, CT-A abd negative 2020  Sleep Apnea Screened     - Comments uses CPAP faithfully  Thyroid Disease Screened  Hyperaldosteronism Screened  Pheochromocytoma N/A  Cushing's Syndrome N/A  Hyperparathyroidism Screened  Coarctation of the Aorta Screened     - Comments BP symmetric  Compliance Screened    Relevant Labs/Studies:    Latest Ref Rng & Units 11/27/2022   11:37 AM 10/09/2022    4:49 PM 05/14/2022    2:09 PM  Basic Labs  Sodium 134 - 144 mmol/L 139  137  137   Potassium 3.5 - 5.2 mmol/L 4.4  3.9  4.4   Creatinine 0.57 - 1.00 mg/dL 0.85  0.87  0.88        Latest Ref Rng & Units 10/09/2022    4:49 PM 11/09/2020    3:14 PM  Thyroid   TSH 0.450 - 4.500 uIU/mL 4.030  2.03                 11/15/2017   11:52 AM  Renovascular   Renal Artery [place]  Completed Yes     Disposition:    FU with Mckinze Poirier C. Oval Linsey, MD, [facility] in 6 months.   Medication Adjustments/Labs and Tests Ordered: Current medicines are  reviewed at length with the patient today.  Concerns regarding medicines are outlined above.   Orders Placed This Encounter  Procedures   EKG 12-Lead   No orders of the defined types were placed in this encounter.    Signed, Skeet Latch, MD  02/04/2023 2:42 PM    [facility]

## 2023-02-04 NOTE — Patient Instructions (Addendum)
Medication Instructions:  Your Physician recommend you continue on your current medication as directed.    Follow-Up: August 8th at 4pm with Dr. Oval Linsey at [facility]

## 2023-02-09 ENCOUNTER — Ambulatory Visit
Admission: RE | Admit: 2023-02-09 | Discharge: 2023-02-09 | Disposition: A | Discharge status: Home / Self Care | Payer: Managed Care, Other (non HMO) | Source: Ambulatory Visit | Attending: Neurology | Admitting: Neurology

## 2023-02-09 ENCOUNTER — Other Ambulatory Visit (HOSPITAL_BASED_OUTPATIENT_CLINIC_OR_DEPARTMENT_OTHER): Payer: Self-pay | Admitting: Cardiovascular Disease

## 2023-02-09 DIAGNOSIS — G43711 Chronic migraine without aura, intractable, with status migrainosus: Secondary | ICD-10-CM

## 2023-02-09 DIAGNOSIS — D352 Benign neoplasm of pituitary gland: Secondary | ICD-10-CM

## 2023-02-09 HISTORY — DX: Benign neoplasm of pituitary gland: D35.2

## 2023-02-09 MED ORDER — GADOPICLENOL 0.5 MMOL/ML IV SOLN
4.0000 mL | Freq: Once | INTRAVENOUS | Status: AC | PRN
  Administered 2023-02-09: 4 mL via INTRAVENOUS

## 2023-02-11 NOTE — Telephone Encounter (Signed)
Rx request sent to pharmacy.  

## 2023-02-12 ENCOUNTER — Encounter: Payer: Self-pay | Admitting: Neurology

## 2023-02-14 ENCOUNTER — Ambulatory Visit: Payer: Managed Care, Other (non HMO) | Admitting: Nurse Practitioner

## 2023-02-14 ENCOUNTER — Encounter: Payer: Self-pay | Admitting: Nurse Practitioner

## 2023-02-14 VITALS — BP 126/80 | HR 65 | Temp 98.0°F | Ht 64.0 in | Wt 189.0 lb

## 2023-02-14 DIAGNOSIS — E7800 Pure hypercholesterolemia, unspecified: Secondary | ICD-10-CM | POA: Diagnosis not present

## 2023-02-14 DIAGNOSIS — D352 Benign neoplasm of pituitary gland: Secondary | ICD-10-CM

## 2023-02-14 DIAGNOSIS — E6609 Other obesity due to excess calories: Secondary | ICD-10-CM

## 2023-02-14 DIAGNOSIS — E1169 Type 2 diabetes mellitus with other specified complication: Secondary | ICD-10-CM | POA: Diagnosis not present

## 2023-02-14 DIAGNOSIS — E669 Obesity, unspecified: Secondary | ICD-10-CM

## 2023-02-14 DIAGNOSIS — I10 Essential (primary) hypertension: Secondary | ICD-10-CM | POA: Diagnosis not present

## 2023-02-14 DIAGNOSIS — R7989 Other specified abnormal findings of blood chemistry: Secondary | ICD-10-CM

## 2023-02-14 DIAGNOSIS — Z6832 Body mass index (BMI) 32.0-32.9, adult: Secondary | ICD-10-CM

## 2023-02-14 MED ORDER — MOUNJARO 5 MG/0.5ML ~~LOC~~ SOAJ: 5.0000 mg | SUBCUTANEOUS | 0 refills | Status: DC

## 2023-02-14 NOTE — Progress Notes (Signed)
I,Sheena H Holbrook,acting as a Education administrator for [name], FNP.,have documented all relevant documentation on the behalf of [name], FNP,as directed by  [name], FNP while in the presence of [name], [place].    Subjective:     Patient ID: Melanie Little , female    DOB: Mar 20, 1971 , 52 y.o.   MRN: [id number]   Chief Complaint  Patient presents with   Hypertension    HPI  Patient presents today for Htn follow up. Patient has no other complaints or concerns.   She is to see the Neurologist in March. She has completed the medication from the GYN which was still low. She can come by anytime for prolactin. She did a repeat MRI this past Saturday.   She is doing burn boot camp. That is helping to curve her cravings.   Wt Readings from Last 3 Encounters: 02/14/23 : 189 lb (85.7 kg) 02/04/23 : 192 lb (87.1 kg) 01/17/23 : 192 lb 9.6 oz (87.4 kg)    Diabetes She presents for her follow-up diabetic visit. She has type 2 diabetes mellitus. There are no hypoglycemic associated symptoms. Pertinent negatives for diabetes include no chest pain. There are no hypoglycemic complications. There are no diabetic complications. Current diabetic treatment includes oral agent (monotherapy). She is following a generally healthy diet. When asked about meal planning, she reported none. She participates in exercise intermittently. Eye exam is not current.  Hypertension Pertinent negatives include no chest pain or palpitations.     Past Medical History:  Diagnosis Date   Abnormal EKG    Abnormal Pap smear of cervix about 2013, 2016   Colpo biopsy with normal result per pt.  colpo 2016 HPV changes   Diabetes mellitus without complication ([place])    DUB (dysfunctional uterine bleeding)    around 2012, saw gyn at the time for eval   Elevated hemoglobin A1c    Fibroid    multiple fibroids   Hypertension    Left ovarian cyst 05/2018   do yearly ultrasound.  Doing GYN ONC consulation for  monitoring.    Migraine    OSA on CPAP    Resistant hypertension 05/02/2021     Family History  Problem Relation Age of Onset   CVA Mother 57       deceased, was 43 at the age of her first stroke   Diabetes Father    Deep vein thrombosis Father    Breast cancer Maternal Aunt    Breast cancer Maternal Aunt    Sleep apnea Neg Hx      Current Outpatient Medications:    amLODipine (NORVASC) 5 MG tablet, TAKE 1 TABLET(5 MG) BY MOUTH DAILY, Disp: 90 tablet, Rfl: 3   Blood Glucose Monitoring Suppl (ONE TOUCH ULTRA MINI) w/Device KIT, 1 each by Does not apply route 3 (three) times daily as needed., Disp: 1 kit, Rfl: 1   EPINEPHrine 0.3 mg/0.3 mL IJ SOAJ injection, Inject 0.3 mLs into the muscle as needed. For Anaphylaxis, Disp: , Rfl:    glucose blood test strip, Use as instructed, Disp: 100 each, Rfl: 12   hydrALAZINE (APRESOLINE) 25 MG tablet, TAKE 1 TABLET(25 MG) BY MOUTH TWICE DAILY, Disp: 180 tablet, Rfl: 1   hydrALAZINE (APRESOLINE) 50 MG tablet, Take 50 mg by mouth daily at 12 noon., Disp: , Rfl:    Lancets (ONETOUCH ULTRASOFT) lancets, Use as instructed, Disp: 100 each, Rfl: 12   nebivolol (BYSTOLIC) 10 MG tablet, Take 1 tablet (10 mg total)  by mouth daily., Disp: 30 tablet, Rfl: 5   norethindrone (NORLYDA) 0.35 MG tablet, TAKE 1 TABLET(0.35 MG) BY MOUTH DAILY, Disp: 84 tablet, Rfl: 4   Semaglutide, 2 MG/DOSE, (OZEMPIC, 2 MG/DOSE,) 8 MG/3ML SOPN, Inject 2 mg into the skin once a week., Disp: 3 mL, Rfl: 5   spironolactone (ALDACTONE) 25 MG tablet, Take 1 tablet (25 mg total) by mouth daily., Disp: 90 tablet, Rfl: 3   tirzepatide (MOUNJARO) 5 MG/0.5ML Pen, Inject 5 mg into the skin once a week., Disp: 2 mL, Rfl: 0   valsartan (DIOVAN) 160 MG tablet, Take 1 tablet (160 mg total) by mouth daily., Disp: 90 tablet, Rfl: 3   bromocriptine (PARLODEL) 2.5 MG tablet, Take 1 tablet (2.5 mg total) by mouth daily., Disp: 90 tablet, Rfl: 3   Allergies  Allergen Reactions    Sulfamethoxazole-Trimethoprim Other (See Comments), Hives, Itching and Swelling     Review of Systems  Constitutional: Negative.   Respiratory: Negative.    Cardiovascular: Negative.  Negative for chest pain, palpitations and leg swelling.  Neurological: Negative.   Psychiatric/Behavioral: Negative.       Today's Vitals   02/14/23 1539  BP: 126/80  Pulse: 65  Temp: 98 F (36.7 C)  TempSrc: Oral  SpO2: 99%  Weight: 189 lb (85.7 kg)  Height: '5\' 4"'$  (1.626 m)   Body mass index is 32.44 kg/m.   Objective:  Physical Exam Vitals reviewed.  Constitutional:      General: She is not in acute distress.    Appearance: Normal appearance. She is well-developed. She is obese.  Cardiovascular:     Rate and Rhythm: Normal rate and regular rhythm.     Pulses: Normal pulses.     Heart sounds: Normal heart sounds. No murmur heard. Pulmonary:     Effort: Pulmonary effort is normal. No respiratory distress.     Breath sounds: Normal breath sounds. No wheezing.  Chest:     Chest wall: No tenderness.  Musculoskeletal:        General: Normal range of motion.  Skin:    General: Skin is warm and dry.     Capillary Refill: Capillary refill takes less than 2 seconds.  Neurological:     General: No focal deficit present.     Mental Status: She is alert and oriented to person, place, and time.     Cranial Nerves: No cranial nerve deficit.     Motor: No weakness.  Psychiatric:        Mood and Affect: Mood normal.        Behavior: Behavior normal.        Thought Content: Thought content normal.        Judgment: Judgment normal.         Assessment And Plan:     1. Type 2 diabetes mellitus with obesity ([place]) Comments: HgbA1c is stable, will try her on Mounjaro pending insurance approval. - Hemoglobin A1c - tirzepatide (MOUNJARO) 5 MG/0.5ML Pen; Inject 5 mg into the skin once a week.  Dispense: 2 mL; Refill: 0  2. Primary hypertension Comments: Blood pressure is much better controlled.   Continue current follow up with cardiology - BMP8+eGFR  3. Elevated cholesterol Comments: .Cholesterol levels are stable.  No current medications.  Encouraged to continue low-fat diet. - Lipid panel  4. Pituitary adenoma ([place]) Comments: Continue follow-up with neurology. - Prolactin  5. Prolactin increased Comments: Prolactin level remains elevated she has done a repeat MRI for neurology to evaluate.  Will continue to monitor in conjunction with neurology - Prolactin     Patient was given opportunity to ask questions. Patient verbalized understanding of the plan and was able to repeat key elements of the plan. All questions were answered to their satisfaction.  [name], FNP   I, [name], FNP, have reviewed all documentation for this visit. The documentation on 02/14/23 for the exam, diagnosis, procedures, and orders are all accurate and complete.   IF YOU HAVE BEEN REFERRED TO A SPECIALIST, IT MAY TAKE 1-2 WEEKS TO SCHEDULE/PROCESS THE REFERRAL. IF YOU HAVE NOT HEARD FROM US/SPECIALIST IN TWO WEEKS, PLEASE GIVE [place] A CALL AT [phone] X [phone].   THE PATIENT IS ENCOURAGED TO PRACTICE SOCIAL DISTANCING DUE TO THE COVID-19 PANDEMIC.

## 2023-02-15 ENCOUNTER — Telehealth: Payer: Self-pay | Admitting: Neurology

## 2023-02-15 ENCOUNTER — Telehealth: Payer: Self-pay

## 2023-02-15 DIAGNOSIS — G43711 Chronic migraine without aura, intractable, with status migrainosus: Secondary | ICD-10-CM

## 2023-02-15 DIAGNOSIS — D352 Benign neoplasm of pituitary gland: Secondary | ICD-10-CM

## 2023-02-15 LAB — BMP8+EGFR
BUN/Creatinine Ratio: 10 (ref 9–23)
BUN: 8 mg/dL (ref 6–24)
CO2: 19 mmol/L — ABNORMAL LOW (ref 20–29)
Calcium: 9.4 mg/dL (ref 8.7–10.2)
Chloride: 105 mmol/L (ref 96–106)
Creatinine, Ser: 0.81 mg/dL (ref 0.57–1.00)
Glucose: 86 mg/dL (ref 70–99)
Potassium: 3.7 mmol/L (ref 3.5–5.2)
Sodium: 144 mmol/L (ref 134–144)
eGFR: 89 mL/min/{1.73_m2} (ref >59)

## 2023-02-15 LAB — HEMOGLOBIN A1C
Est. average glucose Bld gHb Est-mCnc: 131 mg/dL
Hgb A1c MFr Bld: 6.2 % — ABNORMAL HIGH (ref 4.8–5.6)

## 2023-02-15 LAB — LIPID PANEL
Chol/HDL Ratio: 3.2 ratio (ref 0.0–4.4)
Cholesterol, Total: 155 mg/dL (ref 100–199)
HDL: 48 mg/dL (ref >39)
LDL Chol Calc (NIH): 82 mg/dL (ref 0–99)
Triglycerides: 140 mg/dL (ref 0–149)
VLDL Cholesterol Cal: 25 mg/dL (ref 5–40)

## 2023-02-15 LAB — PROLACTIN: Prolactin: 49.6 ng/mL — ABNORMAL HIGH (ref 4.8–33.4)

## 2023-02-15 MED ORDER — BROMOCRIPTINE MESYLATE 2.5 MG PO TABS: 2.5000 mg | ORAL_TABLET | Freq: Every day | ORAL | 3 refills | Status: DC

## 2023-02-15 NOTE — Telephone Encounter (Signed)
PA required for [facility]  PA started via [facility] Key: [id number]   This request has been approved using information available on the patient's profile. [id number];Review Type:Prior Auth;Coverage Start Date:01/16/2023;Coverage End Date:02/15/2024

## 2023-02-15 NOTE — Telephone Encounter (Signed)
Bromocryptine had regulated the prolactin production, but after a treatment course of 45 days commenced  the level is rising to the pro treatment levels again. Needs to resume medication.   Hello Melanie Little, I will order bromocryptine and we will follow yearly with MRI,based on MRI and symptoms may have yearly visual field examination-Plus  this adenoma is likely resectable transsphenoidal - do you have a neurosurgeon preference ?  Marland Kitchen Will you follow up with pituitary hormone screening yearly?  If we at [facility] follow up yearly as well, we could alternate every 6 months.  Arta Bruce.

## 2023-02-18 NOTE — Telephone Encounter (Signed)
Okay, we can check her levels yearly. I do not have a preference for neurosurgery. Thank you   Doreene Burke

## 2023-02-20 ENCOUNTER — Telehealth: Payer: Self-pay | Admitting: Adult Health

## 2023-02-20 NOTE — Addendum Note (Signed)
Addended by: Darleen Crocker on: 02/20/2023 07:21 AM   Modules accepted: Orders

## 2023-02-20 NOTE — Telephone Encounter (Signed)
Referral for Neurosurgery fax to [facility] to see Dr. Ashok Pall. Phone: [phone]: [phone]

## 2023-02-20 NOTE — Telephone Encounter (Signed)
Referral to NS, Dr Christella Noa placed as requested by Dr Brett Fairy.

## 2023-02-26 ENCOUNTER — Encounter: Payer: Self-pay | Admitting: Nurse Practitioner

## 2023-02-26 DIAGNOSIS — R7989 Other specified abnormal findings of blood chemistry: Secondary | ICD-10-CM | POA: Insufficient documentation

## 2023-02-26 DIAGNOSIS — E7800 Pure hypercholesterolemia, unspecified: Secondary | ICD-10-CM | POA: Insufficient documentation

## 2023-02-26 DIAGNOSIS — I10 Essential (primary) hypertension: Secondary | ICD-10-CM | POA: Insufficient documentation

## 2023-02-26 HISTORY — DX: Pure hypercholesterolemia, unspecified: E78.00

## 2023-03-14 ENCOUNTER — Other Ambulatory Visit: Payer: Self-pay | Admitting: Neurology

## 2023-03-14 ENCOUNTER — Ambulatory Visit (INDEPENDENT_AMBULATORY_CARE_PROVIDER_SITE_OTHER): Payer: Managed Care, Other (non HMO) | Admitting: Neurology

## 2023-03-14 ENCOUNTER — Encounter: Payer: Self-pay | Admitting: Neurology

## 2023-03-14 VITALS — BP 139/90 | HR 78 | Ht 64.0 in | Wt 185.2 lb

## 2023-03-14 DIAGNOSIS — N912 Amenorrhea, unspecified: Secondary | ICD-10-CM

## 2023-03-14 DIAGNOSIS — G4733 Obstructive sleep apnea (adult) (pediatric): Secondary | ICD-10-CM | POA: Insufficient documentation

## 2023-03-14 DIAGNOSIS — D352 Benign neoplasm of pituitary gland: Secondary | ICD-10-CM | POA: Diagnosis not present

## 2023-03-14 NOTE — Patient Instructions (Signed)
Prolactin Level Test Why am I having this test? The prolactin level test is often used to diagnose and monitor problems with the pituitary gland, such as pituitary tumors. It may also be used to help find the cause of certain other conditions, such as an abnormal absence of menstrual cycles (amenorrhea) or a thyroid gland that does not produce enough hormones (hypothyroidism). Your health care provider may order this test if you have: Irregular menstrual periods. Loss of libido. Milky fluid coming from your nipples (when not breastfeeding). Tiredness (fatigue). What is being tested? This test measures the amount of prolactin in your blood. Prolactin is a hormone that is produced by the pituitary gland. Prolactin levels normally go up and down, or fluctuate, due to stress, illness, trauma, or surgery. Increased levels can also be caused by tumors or other health problems. What kind of sample is taken?  A blood sample is required for this test. It is usually collected by inserting a needle into a blood vessel. Tell a health care provider about: All medicines you are taking, including vitamins, herbs, eye drops, creams, and over-the-counter medicines. How are the results reported? Your test results will be reported as values that indicate the amount of prolactin in your blood. Your health care provider will compare your results to normal ranges that were established after testing a large group of people (reference ranges). Reference ranges may vary among labs and hospitals. For this test, common reference ranges are: Adult female: 3-13 ng/mL. Adult female: 3-27 ng/mL. Pregnant female: 20-400 ng/mL. What do the results mean? Increased levels of prolactin may mean that you have: A pituitary gland tumor. Amenorrhea. Hypothyroidism. Certain pituitary or reproductive syndromes. Kidney failure. Decreased levels of prolactin may indicate: Lack of blood to the pituitary gland. Pituitary gland  failure. Talk with your health care provider about what your results mean. Questions to ask your health care provider Ask your health care provider, or the department that is doing the test: When will my results be ready? How will I get my results? What are my treatment options? What other tests do I need? What are my next steps? Summary The prolactin level test is often used to diagnose and monitor problems with the pituitary gland, such as pituitary tumors. It may also be used to help find the cause of certain other conditions, such as amenorrhea or hypothyroidism. This test measures the amount of prolactin in your blood. Prolactin is a hormone that is produced by the pituitary gland. Prolactin levels normally go up and down, or fluctuate, due to stress, illness, trauma, or surgery. Increased levels can also be caused by tumors or other health problems. Talk with your health care provider about what your results mean. This information is not intended to replace advice given to you by your health care provider. Make sure you discuss any questions you have with your health care provider. Document Revised: 11/22/2020 Document Reviewed: 11/22/2020 Elsevier Patient Education  [place].

## 2023-03-14 NOTE — Progress Notes (Signed)
Provider:  Larey Seat, MD  Primary Care Physician:  [name], [address] [place] [address]     Referring Provider: [name], [address],  [place] [address]    [place]         Chief Complaint according to patient   Patient presents with:     New Patient (Initial Visit)           HISTORY OF PRESENT ILLNESS:    03-14-2023:  Melanie Little is a 52 y.o. female patient who is here for revisit 03/14/2023 with CPAP compliance data , a HST confirmed the need for ongoing CPAP therapy and she presented with her new CPAP.  This 52 year old woman with migraines with catamenial  pattern,  headache and had irregular cycles, finally amenorrhea- her gynecologist checked for hormone levels- and found elevated  Prolactin ( only 46 , and normal FSH and LH- she was not menopausal. An MRI  of the brain found a pituitary microadenoma, 8 by 5 mm, as of 11-27-2022.  Chief concern according to patient :  " Will I have to take carbegolide ? Bromocriptine for the rest of my life".  Melanie Little was seen by our neurosurgical colleagues at [facility] neurosurgery and the MRI studies show an 8 x 5 microadenoma it has been unchanged in comparison to the scan from November 04, 2022.  They typically present with values of prolactin well over 150/200 this 1 was just under 100.  The gland also appears to sit below the current calcium at this time.  She has no symptoms except that she had an irregular menses she never had milk flow. Once she was placed on bromocriptine her menstrual cycle became irregular.  She is also carrying the diagnosis of hypertension diabetes and has sleep apnea.  She reports no visual restriction of the field.  Memory language attention span.  She is not menopausal yet she will be 52 and may she was switched to Nebulized which Dr. Christella Noa prescribed as a low-dose of 1.5 mg twice a week.  And he will be willing to  refill the medication also I can take this over if needed.  As long as her menstrual cycle remains regular for the time being I think this is a sure sign the prolactin did interfere with her menstrual regularity.  I also have the pleasure of looking at today's compliance data for CPAP which are excellent.  100% compliance at 6 cm water pressure with 3 cm EPR her residual AHI is 1.2/h.  She does have significant air leakage but she does not have central apneas arising.  8 hours and 18 minutes is her average use at time there is no need for any changes and she actually likes her CPAP.    She endorsed today the Epworth Sleepiness Scale at 1 out of 24 points and her fatigue severity at 11 out of 63 points.  Both of these measures are followed for the [employer] quality assurance program.     MM 08/02/22:Melanie Little is a 52 year old female with a history of obstructive sleep apnea on CPAP.  She returns today for follow-up. Denies any new issues. DL is below. Would like a new machine. 08/02/21: Melanie Little is a 52 year old female with a history of obstructive sleep apnea on CPAP.  She returns today for follow-up.  Her download indicates good compliance.  She denies any new issues.  Reports that the CPAP is working well for her.  She returns today for follow-up.   CPAP set up date:  CPAP used at 8 cm H20, 2cm EPR and AHI 1.1/h.   Melanie Little is  a married African-American and left-handed female, presents today with a concern of excessive daytime fatigue and associated poor and nonrestorative sleep. She is especially concerned because her ability to focus seems to be impaired and sometimes she appears more forgetful than she could attribute to age or any other medical factors. She endorsed today the fatigue severity scale at 43 points and the Epworth sleepiness score at 9 points her husband has told her that she snores. Her husband now also has noted her to have apneas. He witnessed some. She feels that she doesn't get  enough sleep and not restorative sleep in general. Risk factors are recent weight gain more than 20 pounds over the last 24 month. When she traveled with her sister last year, the to women share the bedroom. Her snoring was recorded by her roommate. Her cardiologist also advised her that her blood pressure was not responding to several drugs as well that this may be related to an underlying condition of obstructive sleep apnea. She spoke to her primary care physician, . Blima Singer, DO  to be referred here to [facility] since her husband is also a patient here. 3 years ago , while the patient lived in [place], her primary care physician Dr. Teryl Lucy ordered a home sleep test she brought me a copy of the results. She was actually not diagnosed with significant apnea in Summer 2013 her AHI was 3 and her RDI was 9. He listed the following diagnosis in her history; excessive daytime sleepiness, essential hypertension on more than 2 drugs, she does not have a history of congestive heart disease, diabetes or cardiovascular disease.   Sleep habits are as follows: The patient onset my question about her preferred bedtime as "anytime I can".  She returns from work between 5:30 and 6 PM and often likes to take a nap to get refreshed. These naps last 1 hour. She does not recall any dreams during naps. She fights dozing off in the evening hours and has sometimes trouble to stay awake watching a TV show for example. She usually transfers to the bedroom between 8:30 and 9 PM, she likes to watch TV in the bedroom. She will switch the TV off before falling asleep. The bedroom is otherwise cool, quiet and dark. She shares the bed with her husband. She has to go to the bathroom to urinate 5-6 times each night fragmenting her sleep. She estimates getting less than 5 hours of sleep and an average night. She does recall dreaming at nighttime. No nightmares and not specifically vivid dreams were  reported. She rises at 6:50 AM . Usually listens to the news before she gets up, she wakes up spontaneously before the alarm rings. She sleeps on one pillow and prefers sleeping on the side, she does not have a sleep problem. When she wakes up she usually is still in the lateral recumbent position. Her breakfast is usually to go smoothly or yogurt she does not drink coffee or caffeine at it beverages at all. She drinks water and juice. At work she is without any access to natural daylight, working  in office. 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  Interval history on 03/04/2017,CD Melanie Little is here today with  an excellent compliance report of 97%, fatigue severity reduced to 15 points from previously 25, Epworth sleepiness score is endorsed at 0 points. Continued air leaks, but mask is more comfortable. Snores when mask dislodges, dry mouth.  Explained how to set humidifier.   UPDATE 3/13/2019CM  Melanie Little, 52 year old female returns for follow-up with history of obstructive sleep apnea on CPAP.  She also has new complaint today of headache that usually occurs during her menstrual cycle but does not go away afterwards.  It is a dull headache without precipitating factors.  She has been taking Excedrin Migraine with little benefit.  She also complains of intermittent numbness mostly on the left side.  There is a family history of stroke in her mother.  She herself is hypertensive and is on 3 blood pressure medications.  CPAP compliance dated 02/10/2018-03/11/2018 shows compliance greater than 4 hours at 100%.  Average usage 7 hours 44 minutes.  Set pressure 6 cm.  EPR level 2.  AHI 3.2. ESS 0     Updated 07-28-2019, patient reports stiff neck, no stuffy nose and  Good compliance with CPAP.  I have the pleasure of meeting today with Melanie Little, a 52 year old left handed AAF with OSA on CPAP. She endorsed the Epworth sleepiness score at 1 out of 24 possible  points, fatigue severity at 20.  Her compliance to CPAP is excellent she has used the CPAP every of the last 30 days, with an average of 8 hours and 3 minutes set pressure is 6 cmH2O was 2 cm EPR, but the residual AHI has crept up to 5.5 which is higher than before.  She does have a significant amount of air leakage and apneas are obstructive in nature.  There are some unknown type apneas that I attributed to air leak.  However with the excellent compliance and the low Epworth Sleepiness Scale I think that we do not necessarily have to make changes unless the patient would like to try a different mask or interface. Her machine was issued October 2016.  She has a history of HTN uncontrolled on 4 medications and was evaluated for various causes. No renal artery stenosis. Now on one medication and doing better.       Review of Systems: Out of a complete 14 system review, the patient complains of only the following symptoms, and all other reviewed systems are negative.:   I am not sleepy anymore.    How likely are you to doze in the following situations: 0 = not likely, 1 = slight chance, 2 = moderate chance, 3 = high chance   Sitting and Reading? Watching Television? Sitting inactive in a public place (theater or meeting)? As a passenger in a car for an hour without a break? Lying down in the afternoon when circumstances permit? Sitting and talking to someone? Sitting quietly after lunch without alcohol? In a car, while stopped for a few minutes in traffic?   Total = 1/ 24 points   FSS endorsed at 11/ 63 points.   Social History   Socioeconomic History   Marital status: Married    Spouse name: Not on file   Number of children: 1   Years of education: Not on file   Highest education level: Not on file  Occupational History   Occupation: [employer]  Tobacco Use   Smoking status: Never   Smokeless tobacco: Never  Vaping Use   Vaping Use: Never used  Substance and  Sexual Activity  Alcohol use: No    Alcohol/week: 0.0 standard drinks of alcohol   Drug use: No   Sexual activity: Yes    Birth control/protection: Pill    Comment: Micronor  Other Topics Concern   Not on file  Social History Narrative   Work or School: Education officer, 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[employer]      Home Situation: lives with husband      Spiritual Beliefs: Baptist      Lifestyle: walks every other day; diet is bad      Social Determinants of Health   Financial Resource Strain: Low Risk  (03/01/2022)   Overall Financial Resource Strain (CARDIA)    Difficulty of Paying Living Expenses: Not hard at all  Food Insecurity: No Food Insecurity (03/01/2022)   Hunger Vital Sign    Worried About Running Out of Food in the Last Year: Never true    [place] in the Last Year: Never true  Transportation Needs: No Transportation Needs (03/01/2022)   PRAPARE - Hydrologist (Medical): No    Lack of Transportation (Non-Medical): No  Physical Activity: Inactive (03/01/2022)   Exercise Vital Sign    Days of Exercise per Week: 0 days    Minutes of Exercise per Session: 0 min  Stress: Not on file  Social Connections: Not on file    Family History  Problem Relation Age of Onset   CVA Mother 29       deceased, was 37 at the age of her first stroke   Diabetes Father    Deep vein thrombosis Father    Breast cancer Maternal Aunt    Breast cancer Maternal Aunt    Sleep apnea Neg Hx     Past Medical History:  Diagnosis Date   Abnormal EKG    Abnormal Pap smear of cervix about 2013, 2016   Colpo biopsy with normal result per pt.  colpo 2016 HPV changes   Diabetes mellitus without complication ([place])    DUB (dysfunctional uterine bleeding)    around 2012, saw gyn at the time for eval   Elevated hemoglobin A1c    Fibroid    multiple fibroids   Hypertension    Left ovarian cyst 05/2018   do yearly ultrasound.  Doing GYN ONC consulation for  monitoring.    Migraine    OSA on CPAP    Resistant hypertension 05/02/2021    Past Surgical History:  Procedure Laterality Date   BREAST REDUCTION SURGERY  2005   CHOLECYSTECTOMY  2004   INCISION AND DRAINAGE Bilateral 2015   Bilateral axillary region ebaceous cyst   IR RADIOLOGIST EVAL & MGMT  09/14/2022   IR RADIOLOGIST EVAL & MGMT  11/08/2022   MASS EXCISION N/A 12/02/2020   Procedure: EXCISION OF TWO MIDLINE BACK SUBCUTANEOUS MASSES;  Surgeon: Felicie Morn, MD;  Location: [facility];  Service: General;  Laterality: N/A;   REDUCTION MAMMAPLASTY Bilateral 2005     Current Outpatient Medications on File Prior to Visit  Medication Sig Dispense Refill   amLODipine (NORVASC) 5 MG tablet TAKE 1 TABLET(5 MG) BY MOUTH DAILY 90 tablet 3   Blood Glucose Monitoring Suppl (ONE TOUCH ULTRA MINI) w/Device KIT 1 each by Does not apply route 3 (three) times daily as needed. 1 kit 1   cabergoline (DOSTINEX) 0.5 MG tablet Take 0.25 mg by mouth 2 (two) times a week.     EPINEPHrine 0.3 mg/0.3 mL IJ  SOAJ injection Inject 0.3 mLs into the muscle as needed. For Anaphylaxis     glucose blood test strip Use as instructed 100 each 12   hydrALAZINE (APRESOLINE) 25 MG tablet TAKE 1 TABLET(25 MG) BY MOUTH TWICE DAILY 180 tablet 1   hydrALAZINE (APRESOLINE) 50 MG tablet Take 50 mg by mouth daily at 12 noon.     Lancets (ONETOUCH ULTRASOFT) lancets Use as instructed 100 each 12   nebivolol (BYSTOLIC) 10 MG tablet Take 1 tablet (10 mg total) by mouth daily. 30 tablet 5   norethindrone (NORLYDA) 0.35 MG tablet TAKE 1 TABLET(0.35 MG) BY MOUTH DAILY 84 tablet 4   spironolactone (ALDACTONE) 25 MG tablet Take 1 tablet (25 mg total) by mouth daily. 90 tablet 3   tirzepatide (MOUNJARO) 5 MG/0.5ML Pen Inject 5 mg into the skin once a week. 2 mL 0   valsartan (DIOVAN) 160 MG tablet Take 1 tablet (160 mg total) by mouth daily. 90 tablet 3   bromocriptine (PARLODEL) 2.5 MG tablet Take 1 tablet (2.5 mg  total) by mouth daily. 90 tablet 3   Semaglutide, 2 MG/DOSE, (OZEMPIC, 2 MG/DOSE,) 8 MG/3ML SOPN Inject 2 mg into the skin once a week. (Patient not taking: Reported on 03/14/2023) 3 mL 5   No current facility-administered medications on file prior to visit.    Allergies  Allergen Reactions   Sulfamethoxazole-Trimethoprim Other (See Comments), Hives, Itching and Swelling     DIAGNOSTIC DATA (LABS, IMAGING, TESTING) - I reviewed patient records, labs, notes, testing and imaging myself where available. We reviewed the last MRI 11-27-2022 here together. 03-14-2023  Lab Results  Component Value Date   WBC 8.9 10/09/2022   HGB 12.7 10/09/2022   HCT 38.6 10/09/2022   MCV 68 (L) 10/09/2022   PLT 435 10/09/2022      Component Value Date/Time   NA 144 02/14/2023 1633   K 3.7 02/14/2023 1633   CL 105 02/14/2023 1633   CO2 19 (L) 02/14/2023 1633   GLUCOSE 86 02/14/2023 1633   GLUCOSE 129 (H) 11/30/2020 0945   BUN 8 02/14/2023 1633   CREATININE 0.81 02/14/2023 1633   CREATININE 0.70 11/09/2020 1514   CALCIUM 9.4 02/14/2023 1633   PROT 7.5 10/09/2022 1649   ALBUMIN 4.9 10/09/2022 1649   AST 14 10/09/2022 1649   ALT 13 10/09/2022 1649   ALKPHOS 70 10/09/2022 1649   BILITOT 1.4 (H) 10/09/2022 1649   GFRNONAA >60 11/30/2020 0945   GFRNONAA >89 04/30/2017 1529   GFRAA >60 02/25/2019 1420   GFRAA >89 04/30/2017 1529   Lab Results  Component Value Date   CHOL 155 02/14/2023   HDL 48 02/14/2023   LDLCALC 82 02/14/2023   TRIG 140 02/14/2023   CHOLHDL 3.2 02/14/2023   Lab Results  Component Value Date   HGBA1C 6.2 (H) 02/14/2023   Lab Results  Component Value Date   VITAMINB12 503 10/09/2022   Lab Results  Component Value Date   TSH 4.030 10/09/2022   HST 09-10-2022:  MPRESSION:  This HST confirms the presence of severe sleep apnea with REM sleep accentuation, frequent brief oxygen desaturations, and overall reduction of sleep time.   RECOMMENDATION: The patient will  continue CPAP therapy with an interface of her choice, an auto titration CPAP device will be provided with a setting between 5 and 12 cm water pressure, 2 cm expiratory pressure relief, and heated humidification.  PHYSICAL EXAM:  Today's Vitals   03/14/23 0822  BP: (!) 139/90  Pulse:  78  Weight: 185 lb 3.2 oz (84 kg)  Height: '5\' 4"'$  (1.626 m)   Body mass index is 31.79 kg/m.   Wt Readings from Last 3 Encounters:  03/14/23 185 lb 3.2 oz (84 kg)  02/14/23 189 lb (85.7 kg)  02/04/23 192 lb (87.1 kg)     Ht Readings from Last 3 Encounters:  03/14/23 '5\' 4"'$  (1.626 m)  02/14/23 '5\' 4"'$  (1.626 m)  02/04/23 '5\' 4"'$  (1.626 m)       General: The patient is awake, alert and appears not in acute distress. The patient is well groomed. Head: Normocephalic, atraumatic.  Neck is supple. Mallampati 3,  neck circumference:15.5  inches . Nasal airflow congested  patent.  Cardiovascular:  Regular rate and cardiac rhythm by pulse,  without distended neck veins. Respiratory: Lungs are clear to auscultation.  Skin:  Without evidence of ankle edema, or rash. Trunk: The patient's posture is erect.     Generalized: Well developed, in no acute distress  Chest: Lungs clear to auscultation bilaterally   Neurological examination  No changes in a taste or smell. She is congested today.  CN : Peripheral visual field by finger perimetry intact.   Equal pupil size and reactive to light.  Mentation: Alert oriented to time, place, history taking. Follows all commands speech and language fluent  Head turning and shoulder shrug  were normal and symmetric. Gait and station: Gait is normal.  Coordination: Rapid alternating movements in the fingers/hands were of normal speed.  The Finger-to-nose maneuver was intact without evidence of ataxia, dysmetria or tremor.  Deep tendon reflexes: in the  upper and lower extremities are symmetric and intact.  Babinski response was deferred .    ASSESSMENT AND PLAN 51  y.o. year old female  here with:    1) severe OSA , confirmed again by HST . Over 5 years on CPAP now  100% compliance of CPAP, no residual sleepiness or fatigue.   2) microadenoma , very small prolactinoma, with reduction in prolactin allowing for regular menstrual cycles again- on 0.5 mg  cabergoline twice weekly, Dr. Christella Noa initiated this treatment but she will regular follow up here and we can provide this medication or her PCP.   3) we will repeat an MRI in 12 - 24 months. RV with [place] every year.  CPAP and prolactin check at the same time.      I plan to follow up either personally or through our NP within 12  months.   I would like to thank [name], [place] [name], [address],  [place] [address] for allowing me to meet with and to take care of this pleasant patient.   CC: I will share my notes with NP [name], NP .  After spending a total time of  35  minutes face to face and additional time for physical and neurologic examination, review of laboratory studies,  personal review of imaging studies, reports and results of other testing and review of referral information / records as far as provided in visit,   Electronically signed by: Larey Seat, MD 03/14/2023 8:29 AM  [facility] certified by The [employer] of Sleep Medicine and Diplomate of the [employer] of Sleep Medicine. Board certified In Neurology through the [place], Fellow of the [employer] of Neurology. Medical Director of [employer].

## 2023-03-18 ENCOUNTER — Other Ambulatory Visit: Payer: Self-pay | Admitting: Nurse Practitioner

## 2023-03-18 DIAGNOSIS — E1169 Type 2 diabetes mellitus with other specified complication: Secondary | ICD-10-CM

## 2023-03-22 ENCOUNTER — Other Ambulatory Visit: Payer: Self-pay | Admitting: Nurse Practitioner

## 2023-04-09 ENCOUNTER — Encounter: Payer: Self-pay | Admitting: Obstetrics and Gynecology

## 2023-04-09 ENCOUNTER — Telehealth: Payer: Managed Care, Other (non HMO) | Admitting: Obstetrics and Gynecology

## 2023-04-09 VITALS — Ht 64.0 in | Wt 193.0 lb

## 2023-04-09 DIAGNOSIS — D252 Subserosal leiomyoma of uterus: Secondary | ICD-10-CM | POA: Diagnosis not present

## 2023-04-09 DIAGNOSIS — D251 Intramural leiomyoma of uterus: Secondary | ICD-10-CM

## 2023-04-09 DIAGNOSIS — D250 Submucous leiomyoma of uterus: Secondary | ICD-10-CM | POA: Diagnosis not present

## 2023-04-09 NOTE — Progress Notes (Signed)
GYNECOLOGY VIRTUAL VISIT ENCOUNTER NOTE  Provider location: Center for [facility]   Patient location: Home  I connected with Melanie Little on 04/09/23 at 11:15 AM EDT by MyChart Video Encounter and verified that I am speaking with the correct person using two identifiers.   I discussed the limitations, risks, security and privacy concerns of performing an evaluation and management service virtually and the availability of in person appointments. I also discussed with the patient that there may be a patient responsible charge related to this service. The patient expressed understanding and agreed to proceed.   History:  Melanie Little is a 52 y.o. [phone] female being evaluated today for AUB-L. Has been on medication for prolactinemia with continued abnormal bleeding.      Past Medical History:  Diagnosis Date   Abnormal EKG    Abnormal Pap smear of cervix about 2013, 2016   Colpo biopsy with normal result per pt.  colpo 2016 HPV changes   Diabetes mellitus without complication    DUB (dysfunctional uterine bleeding)    around 2012, saw gyn at the time for eval   Elevated hemoglobin A1c    Fibroid    multiple fibroids   Hypertension    Left ovarian cyst 05/2018   do yearly ultrasound.  Doing GYN ONC consulation for monitoring.    Migraine    OSA on CPAP    Resistant hypertension 05/02/2021   Past Surgical History:  Procedure Laterality Date   BREAST REDUCTION SURGERY  2005   CHOLECYSTECTOMY  2004   INCISION AND DRAINAGE Bilateral 2015   Bilateral axillary region ebaceous cyst   IR RADIOLOGIST EVAL & MGMT  09/14/2022   IR RADIOLOGIST EVAL & MGMT  11/08/2022   MASS EXCISION N/A 12/02/2020   Procedure: EXCISION OF TWO MIDLINE BACK SUBCUTANEOUS MASSES;  Surgeon: Quentin Ore, MD;  Location: [facility];  Service: General;  Laterality: N/A;   REDUCTION MAMMAPLASTY Bilateral 2005   The following portions of the patient's  history were reviewed and updated as appropriate: allergies, current medications, past family history, past medical history, past social history, past surgical history and problem list.    Review of Systems:  Pertinent items noted in HPI and remainder of comprehensive ROS otherwise negative.  Physical Exam:   General:  Alert, oriented and cooperative. Patient appears to be in no acute distress.  Mental Status: Normal mood and affect. Normal behavior. Normal judgment and thought content.   Respiratory: Normal respiratory effort, no problems with respiration noted  Rest of physical exam deferred due to type of encounter  Labs and Imaging No results found for this or any previous visit (from the past 336 hour(s)). No results found.     Assessment and Plan:     1. Intramural, submucous, and subserous leiomyoma of uterus Would be interested in lupron but would like to first know if there would be any interaction with current medications. Would prefer to do medication or injection to see if that helps.       I discussed the assessment and treatment plan with the patient. The patient was provided an opportunity to ask questions and all were answered. The patient agreed with the plan and demonstrated an understanding of the instructions.   The patient was advised to call back or seek an in-person evaluation/go to the ED if the symptoms worsen or if the condition fails to improve as anticipated.  I provided 5 minutes  of face-to-face time during this encounter.   Darliss Cheney, MD Center for [facility], [place]

## 2023-04-15 ENCOUNTER — Other Ambulatory Visit: Payer: Self-pay | Admitting: Nurse Practitioner

## 2023-04-15 DIAGNOSIS — E1169 Type 2 diabetes mellitus with other specified complication: Secondary | ICD-10-CM

## 2023-04-15 MED ORDER — MOUNJARO 7.5 MG/0.5ML ~~LOC~~ SOAJ: 7.5000 mg | SUBCUTANEOUS | 2 refills | Status: DC

## 2023-04-22 ENCOUNTER — Encounter: Payer: Self-pay | Admitting: Obstetrics and Gynecology

## 2023-04-22 ENCOUNTER — Other Ambulatory Visit: Payer: Self-pay | Admitting: Obstetrics and Gynecology

## 2023-04-22 DIAGNOSIS — D250 Submucous leiomyoma of uterus: Secondary | ICD-10-CM

## 2023-04-22 MED ORDER — LEUPROLIDE ACETATE 3.75 MG IM KIT: 3.7500 mg | PACK | Freq: Once | INTRAMUSCULAR | 1 refills | Status: AC

## 2023-04-24 ENCOUNTER — Encounter: Payer: Self-pay | Admitting: Nurse Practitioner

## 2023-04-24 NOTE — Telephone Encounter (Signed)
Dose change?  Thanks!

## 2023-04-25 NOTE — Telephone Encounter (Signed)
75 mg

## 2023-05-13 ENCOUNTER — Other Ambulatory Visit: Payer: Self-pay | Admitting: Nurse Practitioner

## 2023-05-13 DIAGNOSIS — Z1231 Encounter for screening mammogram for malignant neoplasm of breast: Secondary | ICD-10-CM

## 2023-05-15 ENCOUNTER — Other Ambulatory Visit (HOSPITAL_BASED_OUTPATIENT_CLINIC_OR_DEPARTMENT_OTHER): Payer: Self-pay | Admitting: Cardiovascular Disease

## 2023-05-16 ENCOUNTER — Encounter (HOSPITAL_BASED_OUTPATIENT_CLINIC_OR_DEPARTMENT_OTHER): Payer: Self-pay | Admitting: Cardiovascular Disease

## 2023-05-16 DIAGNOSIS — I10 Essential (primary) hypertension: Secondary | ICD-10-CM

## 2023-05-16 MED ORDER — SPIRONOLACTONE 25 MG PO TABS: 25.0000 mg | ORAL_TABLET | Freq: Every day | ORAL | 3 refills | Status: DC

## 2023-05-16 MED ORDER — HYDRALAZINE HCL 25 MG PO TABS: ORAL_TABLET | ORAL | 3 refills | Status: DC

## 2023-05-16 MED ORDER — NEBIVOLOL HCL 10 MG PO TABS: 10.0000 mg | ORAL_TABLET | Freq: Every day | ORAL | 5 refills | Status: DC

## 2023-05-16 MED ORDER — HYDRALAZINE HCL 50 MG PO TABS: 50.0000 mg | ORAL_TABLET | Freq: Every day | ORAL | 3 refills | Status: DC

## 2023-05-16 MED ORDER — VALSARTAN 160 MG PO TABS: 160.0000 mg | ORAL_TABLET | Freq: Every day | ORAL | 3 refills | Status: DC

## 2023-05-16 MED ORDER — AMLODIPINE BESYLATE 5 MG PO TABS: ORAL_TABLET | ORAL | 3 refills | Status: DC

## 2023-05-17 ENCOUNTER — Encounter: Payer: Self-pay | Admitting: Obstetrics and Gynecology

## 2023-05-17 ENCOUNTER — Other Ambulatory Visit: Payer: Self-pay | Admitting: Obstetrics and Gynecology

## 2023-05-17 DIAGNOSIS — D250 Submucous leiomyoma of uterus: Secondary | ICD-10-CM

## 2023-05-17 MED ORDER — MYFEMBREE 40-1-0.5 MG PO TABS: 1.0000 | ORAL_TABLET | Freq: Every day | ORAL | 6 refills | Status: DC

## 2023-06-07 ENCOUNTER — Ambulatory Visit
Admission: RE | Admit: 2023-06-07 | Discharge: 2023-06-07 | Disposition: A | Discharge status: Home / Self Care | Payer: Managed Care, Other (non HMO) | Source: Ambulatory Visit

## 2023-06-07 DIAGNOSIS — Z1231 Encounter for screening mammogram for malignant neoplasm of breast: Secondary | ICD-10-CM

## 2023-06-17 ENCOUNTER — Encounter: Payer: Self-pay | Admitting: Nurse Practitioner

## 2023-06-17 ENCOUNTER — Ambulatory Visit (INDEPENDENT_AMBULATORY_CARE_PROVIDER_SITE_OTHER): Payer: Managed Care, Other (non HMO) | Admitting: Nurse Practitioner

## 2023-06-17 VITALS — BP 112/80 | HR 69 | Temp 98.4°F | Ht 64.0 in | Wt 181.6 lb

## 2023-06-17 DIAGNOSIS — D352 Benign neoplasm of pituitary gland: Secondary | ICD-10-CM

## 2023-06-17 DIAGNOSIS — G8929 Other chronic pain: Secondary | ICD-10-CM

## 2023-06-17 DIAGNOSIS — M545 Low back pain, unspecified: Secondary | ICD-10-CM

## 2023-06-17 DIAGNOSIS — I10 Essential (primary) hypertension: Secondary | ICD-10-CM | POA: Diagnosis not present

## 2023-06-17 DIAGNOSIS — E1169 Type 2 diabetes mellitus with other specified complication: Secondary | ICD-10-CM | POA: Diagnosis not present

## 2023-06-17 DIAGNOSIS — Z6831 Body mass index (BMI) 31.0-31.9, adult: Secondary | ICD-10-CM

## 2023-06-17 DIAGNOSIS — Z2821 Immunization not carried out because of patient refusal: Secondary | ICD-10-CM

## 2023-06-17 DIAGNOSIS — E6609 Other obesity due to excess calories: Secondary | ICD-10-CM

## 2023-06-17 MED ORDER — MOUNJARO 10 MG/0.5ML ~~LOC~~ SOAJ
10.0000 mg | SUBCUTANEOUS | 1 refills | Status: DC
Start: 2023-06-17 — End: 2023-08-28

## 2023-06-17 NOTE — Progress Notes (Signed)
I,Jeanita Carneiro,acting as a Neurosurgeon for [name], FNP.,have documented all relevant documentation on the behalf of [name], FNP,as directed by  [name], FNP while in the presence of [name], FNP.  Subjective:  Patient ID: Melanie Little , female    DOB: April 08, 1971 , 52 y.o.   MRN: [id number]  Chief Complaint  Patient presents with   Diabetes   Hypertension    HPI  Patient presents today for dm, and bp check. Patient reports compliance with medications and has no other concerns today. Patient denies any SOB, Chest pain, and headaches. Patient reports constant pain in her lower left side, patient reports its more of the pack of her hip. She is not exercising as much was taking a swimming class but is catching up on things so not exercising as much.   BP Readings from Last 3 Encounters: 06/17/23 : 112/80 03/14/23 : (!) 139/90 02/14/23 : 126/80       Past Medical History:  Diagnosis Date   Abnormal EKG    Abnormal Pap smear of cervix about 2013, 2016   Colpo biopsy with normal result per pt.  colpo 2016 HPV changes   Diabetes mellitus without complication (HCC)    DUB (dysfunctional uterine bleeding)    around 2012, saw gyn at the time for eval   Elevated hemoglobin A1c    Fibroid    multiple fibroids   Hypertension    Left ovarian cyst 05/2018   do yearly ultrasound.  Doing GYN ONC consulation for monitoring.    Migraine    OSA on CPAP    Resistant hypertension 05/02/2021     Family History  Problem Relation Age of Onset   CVA Mother 54       deceased, was 60 at the age of her first stroke   Diabetes Father    Deep vein thrombosis Father    Breast cancer Maternal Aunt    Breast cancer Maternal Aunt    Sleep apnea Neg Hx      Current Outpatient Medications:    amLODipine (NORVASC) 5 MG tablet, TAKE 1 TABLET(5 MG) BY MOUTH DAILY, Disp: 90 tablet, Rfl: 3   cabergoline (DOSTINEX) 0.5 MG tablet, Take 0.25 mg by mouth 2 (two) times a week., Disp: , Rfl:     EPINEPHrine 0.3 mg/0.3 mL IJ SOAJ injection, Inject 0.3 mLs into the muscle as needed. For Anaphylaxis, Disp: , Rfl:    hydrALAZINE (APRESOLINE) 25 MG tablet, TAKE 1 TABLET(25 MG) BY MOUTH TWICE DAILY, Disp: 180 tablet, Rfl: 3   hydrALAZINE (APRESOLINE) 50 MG tablet, Take 1 tablet (50 mg total) by mouth daily at 12 noon., Disp: 90 tablet, Rfl: 3   nebivolol (BYSTOLIC) 10 MG tablet, Take 1 tablet (10 mg total) by mouth daily., Disp: 30 tablet, Rfl: 5   norethindrone (NORLYDA) 0.35 MG tablet, TAKE 1 TABLET(0.35 MG) BY MOUTH DAILY, Disp: 84 tablet, Rfl: 4   Relugolix-Estradiol-Norethind (MYFEMBREE) 40-1-0.5 MG TABS, Take 1 tablet by mouth daily., Disp: 28 tablet, Rfl: 6   spironolactone (ALDACTONE) 25 MG tablet, Take 1 tablet (25 mg total) by mouth daily., Disp: 90 tablet, Rfl: 3   tirzepatide (MOUNJARO) 10 MG/0.5ML Pen, Inject 10 mg into the skin once a week., Disp: 3 mL, Rfl: 1   valsartan (DIOVAN) 160 MG tablet, Take 1 tablet (160 mg total) by mouth daily., Disp: 90 tablet, Rfl: 3   Blood Glucose Monitoring Suppl (ONE TOUCH ULTRA MINI) w/Device KIT, 1 each by Does not apply  route 3 (three) times daily as needed. (Patient not taking: Reported on 06/17/2023), Disp: 1 kit, Rfl: 1   glucose blood test strip, Use as instructed (Patient not taking: Reported on 06/17/2023), Disp: 100 each, Rfl: 12   Lancets (ONETOUCH ULTRASOFT) lancets, Use as instructed (Patient not taking: Reported on 06/17/2023), Disp: 100 each, Rfl: 12   Allergies  Allergen Reactions   Sulfamethoxazole-Trimethoprim Other (See Comments), Hives, Itching and Swelling     Review of Systems  Constitutional: Negative.   Respiratory: Negative.    Cardiovascular: Negative.  Negative for chest pain, palpitations and leg swelling.  Musculoskeletal:  Positive for arthralgias (hip pain).  Neurological: Negative.   Psychiatric/Behavioral: Negative.       Today's Vitals   06/17/23 1617  BP: 112/80  Pulse: 69  Temp: 98.4 F (36.9 C)   TempSrc: Oral  Weight: 181 lb 9.6 oz (82.4 kg)  Height: 5\' 4"  (1.626 m)  PainSc: 6   PainLoc: Back   Body mass index is 31.17 kg/m.  Wt Readings from Last 3 Encounters:  06/17/23 181 lb 9.6 oz (82.4 kg)  04/09/23 193 lb (87.5 kg)  03/14/23 185 lb 3.2 oz (84 kg)    The 10-year ASCVD risk score (Arnett DK, et al., 2019) is: 5%   Values used to calculate the score:     Age: 89 years     Sex: Female     Is Non-Hispanic African American: Yes     Diabetic: Yes     Tobacco smoker: No     Systolic Blood Pressure: 112 mmHg     Is BP treated: Yes     HDL Cholesterol: 46 mg/dL     Total Cholesterol: 163 mg/dL  Objective:  Physical Exam Vitals reviewed.  Constitutional:      General: She is not in acute distress.    Appearance: Normal appearance. She is well-developed. She is obese.  Cardiovascular:     Rate and Rhythm: Normal rate and regular rhythm.     Pulses: Normal pulses.     Heart sounds: Normal heart sounds. No murmur heard. Pulmonary:     Effort: Pulmonary effort is normal. No respiratory distress.     Breath sounds: Normal breath sounds. No wheezing.  Chest:     Chest wall: No tenderness.  Musculoskeletal:        General: Normal range of motion.  Skin:    General: Skin is warm and dry.     Capillary Refill: Capillary refill takes less than 2 seconds.  Neurological:     General: No focal deficit present.     Mental Status: She is alert and oriented to person, place, and time.     Cranial Nerves: No cranial nerve deficit.     Motor: No weakness.  Psychiatric:        Mood and Affect: Mood normal.        Behavior: Behavior normal.        Thought Content: Thought content normal.        Judgment: Judgment normal.         Assessment And Plan:  1. Type 2 diabetes mellitus with obesity (HCC) Comments: HgbA1c is improving, continue current medications. Will try to send increased dose of Mounjaro - tirzepatide [facility]) 10 MG/0.5ML Pen; Inject 10 mg into the skin  once a week.  Dispense: 3 mL; Refill: 1 - Hemoglobin A1c; Future - Lipid panel; Future  2. Primary hypertension Comments: Blood pressure is well controlled, continue f/u with  Cardiology - Basic metabolic panel; Future  3. Chronic left-sided low back pain without sciatica Comments: Constant pain to her low back radiating around to hip will check xray. - DG HIP UNILAT W OR W/O PELVIS 2-3 VIEWS LEFT; Future  4. Pituitary microadenoma (HCC) Comments: Will recheck prolactin level. - Prolactin; Future  5. Herpes zoster vaccination declined Declines shingrix, educated on disease process and is aware if he changes his mind to notify office  6. Class 1 obesity due to excess calories with serious comorbidity and body mass index (BMI) of 31.0 to 31.9 in adult She is encouraged to strive for BMI less than 30 to decrease cardiac risk. Advised to aim for at least 150 minutes of exercise per week.   Return for schedule lab visit for wednesday.  Patient was given opportunity to ask questions. Patient verbalized understanding of the plan and was able to repeat key elements of the plan. All questions were answered to their satisfaction.  [name], FNP  I, [name], FNP, have reviewed all documentation for this visit. The documentation on 06/17/23 for the exam, diagnosis, procedures, and orders are all accurate and complete.   IF YOU HAVE BEEN REFERRED TO A SPECIALIST, IT MAY TAKE 1-2 WEEKS TO SCHEDULE/PROCESS THE REFERRAL. IF YOU HAVE NOT HEARD FROM US/SPECIALIST IN TWO WEEKS, PLEASE GIVE [place] A CALL AT [phone] X [phone].

## 2023-06-18 ENCOUNTER — Encounter: Payer: Self-pay | Admitting: Nurse Practitioner

## 2023-06-19 ENCOUNTER — Ambulatory Visit
Admission: RE | Admit: 2023-06-19 | Discharge: 2023-06-19 | Disposition: A | Discharge status: Home / Self Care | Payer: Managed Care, Other (non HMO) | Source: Ambulatory Visit | Attending: Nurse Practitioner | Admitting: Nurse Practitioner

## 2023-06-19 ENCOUNTER — Other Ambulatory Visit (INDEPENDENT_AMBULATORY_CARE_PROVIDER_SITE_OTHER): Payer: Managed Care, Other (non HMO)

## 2023-06-19 DIAGNOSIS — E1169 Type 2 diabetes mellitus with other specified complication: Secondary | ICD-10-CM

## 2023-06-19 DIAGNOSIS — Z23 Encounter for immunization: Secondary | ICD-10-CM | POA: Diagnosis not present

## 2023-06-19 DIAGNOSIS — M545 Low back pain, unspecified: Secondary | ICD-10-CM

## 2023-06-19 DIAGNOSIS — D352 Benign neoplasm of pituitary gland: Secondary | ICD-10-CM

## 2023-06-19 DIAGNOSIS — I10 Essential (primary) hypertension: Secondary | ICD-10-CM

## 2023-06-20 LAB — BASIC METABOLIC PANEL
BUN/Creatinine Ratio: 8 — ABNORMAL LOW (ref 9–23)
BUN: 7 mg/dL (ref 6–24)
CO2: 20 mmol/L (ref 20–29)
Calcium: 8.7 mg/dL (ref 8.7–10.2)
Chloride: 105 mmol/L (ref 96–106)
Creatinine, Ser: 0.91 mg/dL (ref 0.57–1.00)
Glucose: 81 mg/dL (ref 70–99)
Potassium: 4.3 mmol/L (ref 3.5–5.2)
Sodium: 140 mmol/L (ref 134–144)
eGFR: 77 mL/min/{1.73_m2} (ref >59)

## 2023-06-20 LAB — LIPID PANEL
Chol/HDL Ratio: 3.5 ratio (ref 0.0–4.4)
Cholesterol, Total: 163 mg/dL (ref 100–199)
HDL: 46 mg/dL (ref >39)
LDL Chol Calc (NIH): 89 mg/dL (ref 0–99)
Triglycerides: 164 mg/dL — ABNORMAL HIGH (ref 0–149)
VLDL Cholesterol Cal: 28 mg/dL (ref 5–40)

## 2023-06-20 LAB — PROLACTIN: Prolactin: 12.2 ng/mL (ref 4.8–33.4)

## 2023-06-20 LAB — HEMOGLOBIN A1C
Est. average glucose Bld gHb Est-mCnc: 117 mg/dL
Hgb A1c MFr Bld: 5.7 % — ABNORMAL HIGH (ref 4.8–5.6)

## 2023-06-28 ENCOUNTER — Encounter: Payer: Self-pay | Admitting: Nurse Practitioner

## 2023-07-09 ENCOUNTER — Other Ambulatory Visit: Payer: Self-pay | Admitting: Nurse Practitioner

## 2023-07-09 DIAGNOSIS — E7800 Pure hypercholesterolemia, unspecified: Secondary | ICD-10-CM

## 2023-07-09 MED ORDER — ATORVASTATIN CALCIUM 10 MG PO TABS
10.0000 mg | ORAL_TABLET | Freq: Every day | ORAL | 2 refills | Status: DC
Start: 2023-07-09 — End: 2023-10-15

## 2023-07-16 ENCOUNTER — Other Ambulatory Visit: Payer: Self-pay | Admitting: Nurse Practitioner

## 2023-07-16 DIAGNOSIS — G8929 Other chronic pain: Secondary | ICD-10-CM

## 2023-08-08 ENCOUNTER — Ambulatory Visit (HOSPITAL_BASED_OUTPATIENT_CLINIC_OR_DEPARTMENT_OTHER): Payer: Managed Care, Other (non HMO) | Admitting: Cardiovascular Disease

## 2023-08-08 ENCOUNTER — Encounter (HOSPITAL_BASED_OUTPATIENT_CLINIC_OR_DEPARTMENT_OTHER): Payer: Self-pay | Admitting: Cardiovascular Disease

## 2023-08-08 VITALS — BP 111/71 | HR 72 | Ht 64.0 in | Wt 184.8 lb

## 2023-08-08 DIAGNOSIS — E669 Obesity, unspecified: Secondary | ICD-10-CM

## 2023-08-08 DIAGNOSIS — G4733 Obstructive sleep apnea (adult) (pediatric): Secondary | ICD-10-CM

## 2023-08-08 DIAGNOSIS — I1A0 Resistant hypertension: Secondary | ICD-10-CM

## 2023-08-08 DIAGNOSIS — E1169 Type 2 diabetes mellitus with other specified complication: Secondary | ICD-10-CM

## 2023-08-08 DIAGNOSIS — I517 Cardiomegaly: Secondary | ICD-10-CM

## 2023-08-08 NOTE — Patient Instructions (Signed)
Medication Instructions:  Your physician recommends that you continue on your current medications as directed. Please refer to the Current Medication list given to you today.   Labwork: NONE  Testing/Procedures: CALCIUM SCORE - THIS WILL COST YOU $99 OUT OF POCKET   Follow-Up: 6 MONTHS WITH DR  OR Ronn Melena NP

## 2023-08-08 NOTE — Progress Notes (Deleted)
Advanced Hypertension Clinic:    Date:  08/08/2023   ID:  Melanie Little, DOB [birth year], MRN [id number]  PCP:  [name], FNP  Cardiologist:  None  Nephrologist:  Referring MD: [name], FNP   CC: Hypertension  History of Present Illness:    Melanie Little is a 52 y.o. female with a hx of hypertension, diabetes, and OSA on CPAP, here for follow up.  She was first seen 02/2022 to establish care in the [facility] Hypertension [facility]. She saw [name], NP on 12/2020 and her BP was 160/120. She previously had renal artery Dopplers 10/2017 which were normal. She had a CT of the Abdomen/Pelvis 01/2019 which showed no a adrenal adenomas.  Valsartan/HCTZ was increased 02/20/22.  She reported first developing hypertension in her 30s.  It became more difficult to control over the preceding 3 years.  In 01/2022 she was started on hydralazine. Then the Valsartan/HCTZ was added, and losartan was stopped.  She was started on amlodipine with plans to transition to [employer].  She followed up with our pharmacist 03/2022 and hydralazine was increased.  She had hypokalemia and her creatinine increased.  At follow-up she reported fatigue and was concerned about her renal function.  She had increasing edema so amlodipine was reduced.  HCTZ was discontinued and she was switched to valsartan and spironolactone.  Hydralazine was reduced to 25 mg in the morning and afternoon and continued to 50 mg in the evening.  She continued to have edema and amlodipine was discontinued.  At her appointment 06/2022 she was feeling well and her blood pressures were better controlled.  S Previous antihypertensives: Amlodipine - Ankle/hand edema Labetalol Carvedilol spironolactone   Past Medical History:  Diagnosis Date   Abnormal EKG    Abnormal Pap smear of cervix about 2013, 2016   Colpo biopsy with normal result per pt.  colpo 2016 HPV changes   Diabetes mellitus without complication (HCC)    DUB (dysfunctional  uterine bleeding)    around 2012, saw gyn at the time for eval   Elevated hemoglobin A1c    Fibroid    multiple fibroids   Hypertension    Left ovarian cyst 05/2018   do yearly ultrasound.  Doing GYN ONC consulation for monitoring.    Migraine    OSA on CPAP    Resistant hypertension 05/02/2021    Past Surgical History:  Procedure Laterality Date   BREAST REDUCTION SURGERY  2005   CHOLECYSTECTOMY  2004   INCISION AND DRAINAGE Bilateral 2015   Bilateral axillary region ebaceous cyst   IR RADIOLOGIST EVAL & MGMT  09/14/2022   IR RADIOLOGIST EVAL & MGMT  11/08/2022   MASS EXCISION N/A 12/02/2020   Procedure: EXCISION OF TWO MIDLINE BACK SUBCUTANEOUS MASSES;  Surgeon: Quentin Ore, MD;  Location: [facility];  Service: General;  Laterality: N/A;   REDUCTION MAMMAPLASTY Bilateral 2005    Current Medications: No outpatient medications have been marked as taking for the 08/08/23 encounter (Appointment) with Chilton Si, MD.     Allergies:   Sulfamethoxazole-trimethoprim   Social History   Socioeconomic History   Marital status: Married    Spouse name: Not on file   Number of children: 1   Years of education: Not on file   Highest education level: Master's degree (e.g., MA, MS, MEng, MEd, MSW, MBA)  Occupational History   Occupation: [employer]  Tobacco Use   Smoking status: Never   Smokeless tobacco:  Never  Vaping Use   Vaping status: Never Used  Substance and Sexual Activity   Alcohol use: No    Alcohol/week: 0.0 standard drinks of alcohol   Drug use: No   Sexual activity: Yes    Birth control/protection: Pill    Comment: Micronor  Other Topics Concern   Not on file  Social History Narrative   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[employer]      Home Situation: lives with husband      Spiritual Beliefs: Baptist      Lifestyle: walks every other day; diet is bad      Social Determinants  of Health   Financial Resource Strain: Low Risk  (06/13/2023)   Overall Financial Resource Strain (CARDIA)    Difficulty of Paying Living Expenses: Not hard at all  Food Insecurity: No Food Insecurity (06/13/2023)   Hunger Vital Sign    Worried About Running Out of Food in the Last Year: Never true    Ran Out of Food in the Last Year: Never true  Transportation Needs: No Transportation Needs (06/13/2023)   PRAPARE - Administrator, Civil Service (Medical): No    Lack of Transportation (Non-Medical): No  Physical Activity: Unknown (06/13/2023)   Exercise Vital Sign    Days of Exercise per Week: 0 days    Minutes of Exercise per Session: Not on file  Stress: No Stress Concern Present (06/13/2023)   [employer] of Occupational Health - Occupational Stress Questionnaire    Feeling of Stress : Not at all  Social Connections: Moderately Integrated (06/13/2023)   Social Connection and Isolation Panel [NHANES]    Frequency of Communication with Friends and Family: Three times a week    Frequency of Social Gatherings with Friends and Family: Once a week    Attends Religious Services: More than 4 times per year    Active Member of [employer] or Organizations: No    Attends Engineer, structural: Not on file    Marital Status: Married     Family History: The patient's family history includes Breast cancer in her maternal aunt and maternal aunt; CVA (age of onset: 73) in her mother; Deep vein thrombosis in her father; Diabetes in her father. There is no history of Sleep apnea.  ROS:   Please see the history of present illness.    (+) Aching pain in chest (+) Shortness of breath (+) Headaches (+) Fatigue All other systems reviewed and are negative.  EKGs/Labs/Other Studies Reviewed:    Lexiscan Myoview 01/31/2021: Nuclear stress EF: 50%. Blood pressure demonstrated a hypertensive response to exercise. ST segment elevation was noted during stress in the aVR leads. The  study is normal. This is a low risk study. The left ventricular ejection fraction is mildly decreased (45-54%).   Very elevated blood pressure, initially 207/110 and then 235/117 after lexiscan. Concerning ECG changes with aVR elevation and horizontal ST depressions as noted. However, normal perfusion without evidence of ischemia. The EF calculates as low normal but visually appears normal.  Echo 01/31/2021:  1. Left ventricular ejection fraction, by estimation, is 60 to 65%. The  left ventricle has normal function. The left ventricle has no regional  wall motion abnormalities. There is moderate left ventricular hypertrophy.  Left ventricular diastolic  parameters were normal. The average left ventricular global longitudinal  strain is -18.9 %. The global longitudinal strain is normal.   2. Right ventricular systolic function is normal. The  right ventricular  size is normal. There is normal pulmonary artery systolic pressure. The  estimated right ventricular systolic pressure is 27.9 mmHg.   3. The mitral valve is normal in structure. No evidence of mitral valve  regurgitation.   4. The aortic valve is tricuspid. Aortic valve regurgitation is not  visualized.   5. Aortic dilatation noted. There is borderline dilatation of the  ascending aorta, measuring 38 mm.   6. The inferior vena cava is normal in size with <50% respiratory  variability, suggesting right atrial pressure of 8 mmHg.   Comparison(s): Prior images unable to be directly viewed, comparison made by report only. Changes from prior study are noted. 11/04/2017: LVEF 60% with moderate to severe LVH.   CT Abdomen/Pelvis 02/26/2019: FINDINGS: Lower Chest: No acute findings.   Hepatobiliary: No hepatic masses identified. Mild diffuse hepatic steatosis noted. Prior cholecystectomy. No evidence of biliary obstruction.   Pancreas:  No mass or inflammatory changes.   Spleen: Within normal limits in size and appearance.    Adrenals/Urinary Tract: No masses identified. No evidence of hydronephrosis.   Stomach/Bowel: No evidence of obstruction, inflammatory process or abnormal fluid collections. Normal appendix visualized.   Vascular/Lymphatic: No pathologically enlarged lymph nodes. No abdominal aortic aneurysm.   Reproductive: Multiple uterine fibroids are seen, largest measuring approximately 3.5 cm. A cystic lesion with a few thin septations and somewhat tubular appearance is seen in the left adnexa which measures 5.0 x 3.1 x 4.2 cm. This is similar in size in appearance compared to previous ultrasound. Differential diagnosis includes cystic ovarian neoplasm and hydrosalpinx. No definite features of malignancy identified. No evidence of ascites.   Other:  None.   Musculoskeletal:  No suspicious bone lesions identified.   IMPRESSION: 1. 5 cm cystic lesion in left adnexa with thin septations and somewhat tubular appearance. Differential diagnosis includes cystic ovarian neoplasm and hydrosalpinx. Consider pelvic MRI without and with contrast for further characterization. 2. Multiple uterine fibroids, largest measuring 3.5 cm. 3. Mild hepatic steatosis.  Bilateral Renal Artery Dopplers 11/15/2017: FINAL INTERPRETATION     Largest Aortic Diameter     1.4 cm     Renal  No evidence of renal artery stenosis, bilaterally.     Tortuous renal arteries, bilaterally.  Normal and symmetrical kidney size.  Normal cortical thickness.  No evidence of SMA or celiac artery stenosis.  Patent renal veins and IVC.   EKG:  EKG is personally reviewed. 02/04/23: Sinus rhythm.  Rate 71 bpm.    Recent Labs: 10/09/2022: ALT 13; Hemoglobin 12.7; Platelets 435; TSH 4.030 06/19/2023: BUN 7; Creatinine, Ser 0.91; Potassium 4.3; Sodium 140   Recent Lipid Panel    Component Value Date/Time   CHOL 163 06/19/2023 0855   TRIG 164 (H) 06/19/2023 0855   HDL 46 06/19/2023 0855   CHOLHDL 3.5 06/19/2023 0855    CHOLHDL 3.8 01/29/2020 0821   VLDL 30.4 12/03/2016 1233   LDLCALC 89 06/19/2023 0855   LDLCALC 105 (H) 01/29/2020 0821    Physical Exam:    VS:  There were no vitals taken for this visit. , BMI There is no height or weight on file to calculate BMI. GENERAL:  Well appearing HEENT: Pupils equal round and reactive, fundi not visualized, oral mucosa unremarkable NECK:  No jugular venous distention, waveform within normal limits, carotid upstroke brisk and symmetric, no bruits, no thyromegaly LUNGS:  Clear to auscultation bilaterally HEART:  RRR.  PMI not displaced or sustained,S1 and S2 within normal limits, no S3,  no S4, no clicks, no rubs, no murmurs ABD:  Flat, positive bowel sounds normal in frequency in pitch, no bruits, no rebound, no guarding, no midline pulsatile mass, no hepatomegaly, no splenomegaly EXT:  2 plus pulses throughout, no edema, no cyanosis no clubbing SKIN:  No rashes no nodules NEURO:  Cranial nerves II through XII grossly intact, motor grossly intact throughout PSYCH:  Cognitively intact, oriented to person place and time  ASSESSMENT/PLAN:      Screening for Secondary Hypertension:     03/01/2022    9:07 AM  Causes  Drugs/Herbals Screened     - Comments High sodium, rare EtOH, no caffeine, rare NSAIDs  Renovascular HTN Screened     - Comments Renal Doppler negative 2018, CT-A abd negative 2020  Sleep Apnea Screened     - Comments uses CPAP faithfully  Thyroid Disease Screened  Hyperaldosteronism Screened  Pheochromocytoma N/A  Cushing's Syndrome N/A  Hyperparathyroidism Screened  Coarctation of the Aorta Screened     - Comments BP symmetric  Compliance Screened    Relevant Labs/Studies:    Latest Ref Rng & Units 06/19/2023    8:55 AM 02/14/2023    4:33 PM 11/27/2022   11:37 AM  Basic Labs  Sodium 134 - 144 mmol/L 140  144  139   Potassium 3.5 - 5.2 mmol/L 4.3  3.7  4.4   Creatinine 0.57 - 1.00 mg/dL 9.60  4.54  0.98        Latest Ref Rng &  Units 10/09/2022    4:49 PM 11/09/2020    3:14 PM  Thyroid   TSH 0.450 - 4.500 uIU/mL 4.030  2.03                 11/15/2017   11:52 AM  Renovascular   Renal Artery [place] Completed Yes     Disposition:    FU with  C. Duke Salvia, MD, [facility] in 6 months.   Medication Adjustments/Labs and Tests Ordered: Current medicines are reviewed at length with the patient today.  Concerns regarding medicines are outlined above.   No orders of the defined types were placed in this encounter.  No orders of the defined types were placed in this encounter.    Signed, Chilton Si, MD  08/08/2023 1:35 PM    [facility]

## 2023-08-08 NOTE — Progress Notes (Signed)
Advanced Hypertension Clinic Follow-up:    Date:  08/08/2023   ID:  Melanie Little, DOB 29-Sep-1971, MRN [id number]  PCP:  [name], FNP  Cardiologist:  None  Nephrologist:  Referring MD: [name], FNP   CC: Hypertension  History of Present Illness:    Melanie Little is a 52 y.o. female with a hx of hypertension, diabetes, and OSA on CPAP, here for follow up.  She was first seen 02/2022 to establish care in the [facility]pert[facility]. She saw [name], NP on 12/2020 and her BP was 160/120. She previously had renal artery Dopplers 10/2017 which were normal. She had a CT of the Abdomen/Pelvis 01/2019 which showed no adrenal adenomas.  Valsartan/HCTZ was increased 02/20/22.  She reported first developing hypertension in her 30s.  It became more difficult to control over the preceding 3 years.  In 01/2022 she was started on hydralazine. Then the Valsartan/HCTZ was added, and losartan was stopped.  She was started on amlodipine with plans to transition to [employer].  She followed up with our pharmacist 03/2022 and hydralazine was increased.  She had hypokalemia and her creatinine increased.  At follow-up she reported fatigue and was concerned about her renal function.  She had increasing edema so amlodipine was reduced.  HCTZ was discontinued and she was switched to valsartan and spironolactone.  Hydralazine was reduced to 25 mg in the morning and afternoon and continued to 50 mg in the evening.  She continued to have edema and amlodipine was discontinued.  At her appointment 06/2022 she was feeling well and her blood pressures were better controlled. Today, she is accompanied by her husband. She reports taking her hydralazine 25 mg in the morning and at night. She has stopped taking her noon-time dose of 50 mg as she was feeling fatigued at work. In the office today her blood pressure is 111/71. Her home blood pressures have been averaging 120s/80s. Also she has been hesitant to start  atorvastatin 10 mg, which was recently prescribed by her PCP. We discussed calcium scoring and she is interested. For a time she was doing well with exercise with PREP, swimming lessons. Lately she has fallen out of the habit of routine exercise, but she plans to keep working on this by starting a regimen of walking 30 minutes three times weekly. She denies any palpitations, chest pain, shortness of breath, peripheral edema, lightheadedness, headaches, syncope, orthopnea, or PND.  Previous antihypertensives: Amlodipine - Ankle/hand edema Labetalol Carvedilol spironolactone   Past Medical History:  Diagnosis Date   Abnormal EKG    Abnormal Pap smear of cervix about 2013, 2016   Colpo biopsy with normal result per pt.  colpo 2016 HPV changes   Diabetes mellitus without complication (HCC)    DUB (dysfunctional uterine bleeding)    around 2012, saw gyn at the time for eval   Elevated hemoglobin A1c    Fibroid    multiple fibroids   Hypertension    Left ovarian cyst 05/2018   do yearly ultrasound.  Doing GYN ONC consulation for monitoring.    Migraine    OSA on CPAP    Resistant hypertension 05/02/2021    Past Surgical History:  Procedure Laterality Date   BREAST REDUCTION SURGERY  2005   CHOLECYSTECTOMY  2004   INCISION AND DRAINAGE Bilateral 2015   Bilateral axillary region ebaceous cyst   IR RADIOLOGIST EVAL & MGMT  09/14/2022   IR RADIOLOGIST EVAL & MGMT  11/08/2022   MASS  EXCISION N/A 12/02/2020   Procedure: EXCISION OF TWO MIDLINE BACK SUBCUTANEOUS MASSES;  Surgeon: Quentin Ore, MD;  Location: [facility];  Service: General;  Laterality: N/A;   REDUCTION MAMMAPLASTY Bilateral 2005    Current Medications: Current Meds  Medication Sig   amLODipine (NORVASC) 5 MG tablet TAKE 1 TABLET(5 MG) BY MOUTH DAILY   Blood Glucose Monitoring Suppl (ONE TOUCH ULTRA MINI) w/Device KIT 1 each by Does not apply route 3 (three) times daily as needed.   cabergoline  (DOSTINEX) 0.5 MG tablet Take 0.25 mg by mouth 2 (two) times a week.   EPINEPHrine 0.3 mg/0.3 mL IJ SOAJ injection Inject 0.3 mLs into the muscle as needed. For Anaphylaxis   glucose blood test strip Use as instructed   hydrALAZINE (APRESOLINE) 25 MG tablet TAKE 1 TABLET(25 MG) BY MOUTH TWICE DAILY   Lancets (ONETOUCH ULTRASOFT) lancets Use as instructed   nebivolol (BYSTOLIC) 10 MG tablet Take 1 tablet (10 mg total) by mouth daily.   norethindrone (NORLYDA) 0.35 MG tablet TAKE 1 TABLET(0.35 MG) BY MOUTH DAILY   Relugolix-Estradiol-Norethind (MYFEMBREE) 40-1-0.5 MG TABS Take 1 tablet by mouth daily.   spironolactone (ALDACTONE) 25 MG tablet Take 1 tablet (25 mg total) by mouth daily.   tirzepatide (MOUNJARO) 10 MG/0.5ML Pen Inject 10 mg into the skin once a week.   valsartan (DIOVAN) 160 MG tablet Take 1 tablet (160 mg total) by mouth daily.   [DISCONTINUED] hydrALAZINE (APRESOLINE) 50 MG tablet Take 1 tablet (50 mg total) by mouth daily at 12 noon.     Allergies:   Sulfamethoxazole-trimethoprim   Social History   Socioeconomic History   Marital status: Married    Spouse name: Not on file   Number of children: 1   Years of education: Not on file   Highest education level: Master's degree (e.g., MA, MS, MEng, MEd, MSW, MBA)  Occupational History   Occupation: [employer]  Tobacco Use   Smoking status: Never   Smokeless tobacco: Never  Vaping Use   Vaping status: Never Used  Substance and Sexual Activity   Alcohol use: No    Alcohol/week: 0.0 standard drinks of alcohol   Drug use: No   Sexual activity: Yes    Birth control/protection: Pill    Comment: [employer]ronor  Other Topics Concern   Not on file  Social History Narrative   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[employer]      Home Situation: lives with husband      Spiritual Beliefs: Baptist      Lifestyle: walks every other day; diet is bad      Social Determinants of  Health   Financial Resource Strain: Low Risk  (06/13/2023)   Overall Financial Resource Strain (CARDIA)    Difficulty of Paying Living Expenses: Not hard at all  Food Insecurity: No Food Insecurity (06/13/2023)   Hunger Vital Sign    Worried About Running Out of Food in the Last Year: Never true    Ran Out of Food in the Last Year: Never true  Transportation Needs: No Transportation Needs (06/13/2023)   PRAPARE - Administrator, Civil Service (Medical): No    Lack of Transportation (Non-Medical): No  Physical Activity: Unknown (06/13/2023)   Exercise Vital Sign    Days of Exercise per Week: 0 days    Minutes of Exercise per Session: Not on file  Stress: No Stress Concern Present (06/13/2023)   [place]  Institute of Occupational Health - Occupational Stress Questionnaire    Feeling of Stress : Not at all  Social Connections: Moderately Integrated (06/13/2023)   Social Connection and Isolation Panel [NHANES]    Frequency of Communication with Friends and Family: Three times a week    Frequency of Social Gatherings with Friends and Family: Once a week    Attends Religious Services: More than 4 times per year    Active Member of [employer] or Organizations: No    Attends Engineer, structural: Not on file    Marital Status: Married     Family History: The patient's family history includes Breast cancer in her maternal aunt and maternal aunt; CVA (age of onset: 2) in her mother; Deep vein thrombosis in her father; Diabetes in her father. There is no history of Sleep apnea.  ROS:   Please see the history of present illness. All other systems reviewed and are negative.  EKGs/Labs/Other Studies Reviewed:    Lexiscan Myoview 01/31/2021: Nuclear stress EF: 50%. Blood pressure demonstrated a hypertensive response to exercise. ST segment elevation was noted during stress in the aVR leads. The study is normal. This is a low risk study. The left ventricular ejection fraction is  mildly decreased (45-54%).   Very elevated blood pressure, initially 207/110 and then 235/117 after lexiscan. Concerning ECG changes with aVR elevation and horizontal ST depressions as noted. However, normal perfusion without evidence of ischemia. The EF calculates as low normal but visually appears normal.  Echo 01/31/2021:  1. Left ventricular ejection fraction, by estimation, is 60 to 65%. The  left ventricle has normal function. The left ventricle has no regional  wall motion abnormalities. There is moderate left ventricular hypertrophy.  Left ventricular diastolic  parameters were normal. The average left ventricular global longitudinal  strain is -18.9 %. The global longitudinal strain is normal.   2. Right ventricular systolic function is normal. The right ventricular  size is normal. There is normal pulmonary artery systolic pressure. The  estimated right ventricular systolic pressure is 27.9 mmHg.   3. The mitral valve is normal in structure. No evidence of mitral valve  regurgitation.   4. The aortic valve is tricuspid. Aortic valve regurgitation is not  visualized.   5. Aortic dilatation noted. There is borderline dilatation of the  ascending aorta, measuring 38 mm.   6. The inferior vena cava is normal in size with <50% respiratory  variability, suggesting right atrial pressure of 8 mmHg.   Comparison(s): Prior images unable to be directly viewed, comparison made by report only. Changes from prior study are noted. 11/04/2017: LVEF 60% with moderate to severe LVH.   CT Abdomen/Pelvis 02/26/2019: FINDINGS: Lower Chest: No acute findings.   Hepatobiliary: No hepatic masses identified. Mild diffuse hepatic steatosis noted. Prior cholecystectomy. No evidence of biliary obstruction.   Pancreas:  No mass or inflammatory changes.   Spleen: Within normal limits in size and appearance.   Adrenals/Urinary Tract: No masses identified. No evidence of hydronephrosis.    Stomach/Bowel: No evidence of obstruction, inflammatory process or abnormal fluid collections. Normal appendix visualized.   Vascular/Lymphatic: No pathologically enlarged lymph nodes. No abdominal aortic aneurysm.   Reproductive: Multiple uterine fibroids are seen, largest measuring approximately 3.5 cm. A cystic lesion with a few thin septations and somewhat tubular appearance is seen in the left adnexa which measures 5.0 x 3.1 x 4.2 cm. This is similar in size in appearance compared to previous ultrasound. Differential diagnosis includes cystic ovarian neoplasm  and hydrosalpinx. No definite features of malignancy identified. No evidence of ascites.   Other:  None.   Musculoskeletal:  No suspicious bone lesions identified.   IMPRESSION: 1. 5 cm cystic lesion in left adnexa with thin septations and somewhat tubular appearance. Differential diagnosis includes cystic ovarian neoplasm and hydrosalpinx. Consider pelvic MRI without and with contrast for further characterization. 2. Multiple uterine fibroids, largest measuring 3.5 cm. 3. Mild hepatic steatosis.  Bilateral Renal Artery Dopplers 11/15/2017: FINAL INTERPRETATION     Largest Aortic Diameter     1.4 cm     Renal  No evidence of renal artery stenosis, bilaterally.     Tortuous renal arteries, bilaterally.  Normal and symmetrical kidney size.  Normal cortical thickness.  No evidence of SMA or celiac artery stenosis.  Patent renal veins and IVC.   EKG:  EKG is personally reviewed. 08/08/2023:  Not ordered. 02/04/23: Sinus rhythm.  Rate 71 bpm.    Recent Labs: 10/09/2022: ALT 13; Hemoglobin 12.7; Platelets 435; TSH 4.030 06/19/2023: BUN 7; Creatinine, Ser 0.91; Potassium 4.3; Sodium 140   Recent Lipid Panel    Component Value Date/Time   CHOL 163 06/19/2023 0855   TRIG 164 (H) 06/19/2023 0855   HDL 46 06/19/2023 0855   CHOLHDL 3.5 06/19/2023 0855   CHOLHDL 3.8 01/29/2020 0821   VLDL 30.4 12/03/2016 1233    LDLCALC 89 06/19/2023 0855   LDLCALC 105 (H) 01/29/2020 0821    Physical Exam:    VS:  BP 111/71 (BP Location: Right Arm, Patient Position: Sitting, Cuff Size: Normal)   Pulse 72   Ht 5\' 4"  (1.626 m)   Wt 184 lb 12.8 oz (83.8 kg)   SpO2 99%   BMI 31.72 kg/m  , BMI Body mass index is 31.72 kg/m. GENERAL:  Well appearing HEENT: Pupils equal round and reactive, fundi not visualized, oral mucosa unremarkable NECK:  No jugular venous distention, waveform within normal limits, carotid upstroke brisk and symmetric, no bruits, no thyromegaly LUNGS:  Clear to auscultation bilaterally HEART:  RRR.  PMI not displaced or sustained,S1 and S2 within normal limits, no S3, no S4, no clicks, no rubs, no murmurs ABD:  Flat, positive bowel sounds normal in frequency in pitch, no bruits, no rebound, no guarding, no midline pulsatile mass, no hepatomegaly, no splenomegaly EXT:  2 plus pulses throughout, no edema, no cyanosis no clubbing SKIN:  No rashes no nodules NEURO:  Cranial nerves II through XII grossly intact, motor grossly intact throughout PSYCH:  Cognitively intact, oriented to person place and time  ASSESSMENT/PLAN:    # Hypertension Well controlled with current regimen. Patient reports fatigue with additional midday Hydralazine dose, which has been discontinued. Blood pressure readings consistently around 120/80. -Continue current regimen of Hydralazine 25mg  in the morning and evening. -Check blood pressure regularly and report if consistently lower or if symptoms of hypotension occur.  # Diabetes Mellitus Prediabetes range currently, but has been in diabetes range in the past. Patient is not currently on a statin, but has been recommended to start one by primary care physician. -Order coronary calcium score to assess for presence of coronary artery disease and to guide decision on statin therapy. -Continue current diabetes management and monitor HbA1c.  # Exercise and Weight  Management Patient has set a goal to walk for 30 minutes three times a week at work. -Encourage adherence to exercise plan and regular physical activity.  Follow-up in 6 months to reassess blood pressure control and diabetes management. If blood  pressure readings become consistently lower or symptoms of hypotension occur, patient to contact office sooner.        Screening for Secondary Hypertension:     03/01/2022    9:07 AM  Causes  Drugs/Herbals Screened     - Comments High sodium, rare EtOH, no caffeine, rare NSAIDs  Renovascular HTN Screened     - Comments Renal Doppler negative 2018, CT-A abd negative 2020  Sleep Apnea Screened     - Comments uses CPAP faithfully  Thyroid Disease Screened  Hyperaldosteronism Screened  Pheochromocytoma N/A  Cushing's Syndrome N/A  Hyperparathyroidism Screened  Coarctation of the Aorta Screened     - Comments BP symmetric  Compliance Screened    Relevant Labs/Studies:    Latest Ref Rng & Units 06/19/2023    8:55 AM 02/14/2023    4:33 PM 11/27/2022   11:37 AM  Basic Labs  Sodium 134 - 144 mmol/L 140  144  139   Potassium 3.5 - 5.2 mmol/L 4.3  3.7  4.4   Creatinine 0.57 - 1.00 mg/dL 4.78  2.95  6.21        Latest Ref Rng & Units 10/09/2022    4:49 PM 11/09/2020    3:14 PM  Thyroid   TSH 0.450 - 4.500 uIU/mL 4.030  2.03                 11/15/2017   11:52 AM  Renovascular   Renal Artery [place] Completed Yes     Disposition:    FU with  C. Duke Salvia, MD, [facility] in 6 months.  Medication Adjustments/Labs and Tests Ordered: Current medicines are reviewed at length with the patient today.  Concerns regarding medicines are outlined above.   Orders Placed This Encounter  Procedures   CT CARDIAC SCORING (SELF PAY ONLY)   No orders of the defined types were placed in this encounter.  I,Mathew Stumpf,acting as a Neurosurgeon for Chilton Si, MD.,have documented all relevant documentation on the behalf of Chilton Si, MD,as  directed by  Chilton Si, MD while in the presence of Chilton Si, MD.  I,  C. Duke Salvia, MD have reviewed all documentation for this visit.  The documentation of the exam, diagnosis, procedures, and orders on 08/08/2023 are all accurate and complete.   Signed, Chilton Si, MD  08/08/2023 5:06 PM    [facility]

## 2023-08-09 ENCOUNTER — Encounter: Payer: Self-pay | Admitting: Orthopedic Surgery

## 2023-08-09 ENCOUNTER — Other Ambulatory Visit: Payer: Self-pay

## 2023-08-09 ENCOUNTER — Ambulatory Visit (INDEPENDENT_AMBULATORY_CARE_PROVIDER_SITE_OTHER): Payer: Managed Care, Other (non HMO) | Admitting: Orthopedic Surgery

## 2023-08-09 VITALS — BP 131/85 | HR 73 | Ht 62.0 in | Wt 182.0 lb

## 2023-08-09 DIAGNOSIS — M545 Low back pain, unspecified: Secondary | ICD-10-CM | POA: Diagnosis not present

## 2023-08-09 NOTE — Progress Notes (Signed)
Orthopedic Spine Surgery Office Note  Assessment: Patient is a 52 y.o. female with left-sided low back pain in the area of the SI joint and the L5/S1 facet. Differential at this point includes SI joint pathology and L5/S1 facet arthropathy   Plan: -Explained that initially conservative treatment is tried as a significant number of patients may experience relief with these treatment modalities. Discussed that the conservative treatments include:  -activity modification  -physical therapy  -over the counter pain medications  -medrol dosepak  -lumbar steroid injections -Patient has tried tylenol, ibuprofen  -Recommended diagnostic/therapeutic left SI joint injection -Patient should return to office in 4 weeks, x-rays at next visit: none   Patient expressed understanding of the plan and all questions were answered to the patient's satisfaction.   ___________________________________________________________________________   History:  Patient is a 52 y.o. female who presents today for lumbar spine.  Patient has had over 1 year of left-sided low back pain.  There was no trauma or injury that preceded the onset of pain.  Pain does not radiate into either lower extremity.  She feels the pain on a daily basis.  She said sometimes it is tender even to touch over the area.  She notices the pain mostly with activity but will feel it at rest as well.  She has not noticed any particular movements or positions that make it better or worse.   Weakness: denies Symptoms of imbalance: denies Paresthesias and numbness: denies Bowel or bladder incontinence: denies Saddle anesthesia: denies  Treatments tried: tylenol, ibuprofen  Review of systems: Denies fevers and chills, night sweats, unexplained weight loss, history of cancer, pain that wakes them at night  Past medical history: Migraines HTN Sleep apnea  Allergies: bactrim  Past surgical history:  Breast reduction  surgery Cholecystectomy I&D sebaceous cyst Excision of subcutaneous mass  Social history: Denies use of nicotine product (smoking, vaping, patches, smokeless) Alcohol use: denies Denies recreational drug use   Physical Exam:  BMI of 33.3  General: no acute distress, appears stated age Neurologic: alert, answering questions appropriately, following commands Respiratory: unlabored breathing on room air, symmetric chest rise Psychiatric: appropriate affect, normal cadence to speech   MSK (spine):  -Strength exam      Left  Right EHL    5/5  5/5 TA    5/5  5/5 GSC    5/5  5/5 Knee extension  5/5  5/5 Hip flexion   5/5  5/5  -Sensory exam    Sensation intact to light touch in L3-S1 nerve distributions of bilateral lower extremities  -Achilles DTR: 2/4 on the left, 2/4 on the right -Patellar tendon DTR: 2/4 on the left, 2/4 on the right  -Straight leg raise: Negative bilaterally -Femoral nerve stretch test: negative bilaterally -Clonus: no beats bilaterally  -Left hip exam: No pain to range of motion, negative Stinchfield, positive FABER, positive SI joint compression test, tender palpation over the SI joint, negative Gaenslen's, negative SI joint distraction test -Right hip exam: No pain through range of motion  Imaging: XR of the lumbar spine from 08/09/2023 was independently reviewed and interpreted, showing disc height loss at L5/S1. No other significant degenerative changes. No evidence of instability on flexion/extension views. No fracture or dislocation seen.    Patient name: Melanie Little Patient MRN: [id number] Date of visit: 08/09/23

## 2023-08-19 ENCOUNTER — Encounter: Payer: Self-pay | Admitting: Sports Medicine

## 2023-08-19 ENCOUNTER — Ambulatory Visit (INDEPENDENT_AMBULATORY_CARE_PROVIDER_SITE_OTHER): Payer: Managed Care, Other (non HMO) | Admitting: Sports Medicine

## 2023-08-19 ENCOUNTER — Other Ambulatory Visit: Payer: Self-pay

## 2023-08-19 DIAGNOSIS — M545 Low back pain, unspecified: Secondary | ICD-10-CM

## 2023-08-19 DIAGNOSIS — M533 Sacrococcygeal disorders, not elsewhere classified: Secondary | ICD-10-CM | POA: Diagnosis not present

## 2023-08-19 DIAGNOSIS — G8929 Other chronic pain: Secondary | ICD-10-CM | POA: Diagnosis not present

## 2023-08-19 NOTE — Progress Notes (Signed)
   Procedure Note  Patient: Melanie Little             Date of Birth: November 08, 1971           MRN: [id number]             Visit Date: 08/19/2023  Procedures: Visit Diagnoses:  1. Chronic left SI joint pain   2. Chronic left-sided low back pain without sciatica    U/S-guided SI-joint injection, left   After discussion of risk/benefits/indications, informed verbal consent was obtained. A timeout was then performed. The patient was positioned in a prone position on exam room table with a pillow placed under the pelvis for mild hip flexion. The SI joint area was cleaned and prepped with betadine and alcohol swabs. Sterile ultrasound gel was applied and the ultrasound transducer was placed in an anatomic axial plane over the PSIS, then moved distally over the SI-joint. Using ultrasound guidance, a 22-gauge, 3.5" needle was inserted from a medial to lateral approach utilizing an in-plane approach and directed into the SI-joint. The SI-joint was then injected with a mixture of 4:2 lidocaine:depomedrol with visualization of the injectate flow into the SI-joint under ultrasound visualization. The patient tolerated the procedure well without immediate complications.  - I evaluated the patient about 5 minutes post-injection and she was doing well without AE's - follow-up with Dr. Christell Constant as indicated; I am happy to see them as needed  Madelyn Brunner, DO Primary Care Sports Medicine Physician  [facility] - Orthoped[facility]  This note was dictated using Dragon naturally speaking software and may contain errors in syntax, spelling, or content which have not been identified prior to signing this note.

## 2023-08-28 ENCOUNTER — Other Ambulatory Visit: Payer: Self-pay

## 2023-08-28 ENCOUNTER — Encounter: Payer: Self-pay | Admitting: Nurse Practitioner

## 2023-08-28 DIAGNOSIS — E669 Obesity, unspecified: Secondary | ICD-10-CM

## 2023-08-28 MED ORDER — MOUNJARO 10 MG/0.5ML ~~LOC~~ SOAJ: 10.0000 mg | SUBCUTANEOUS | 1 refills | Status: DC

## 2023-09-09 ENCOUNTER — Ambulatory Visit: Payer: Managed Care, Other (non HMO) | Admitting: Orthopedic Surgery

## 2023-09-09 DIAGNOSIS — M47819 Spondylosis without myelopathy or radiculopathy, site unspecified: Secondary | ICD-10-CM | POA: Diagnosis not present

## 2023-09-09 NOTE — Progress Notes (Signed)
Orthopedic Spine Surgery Office Note   Assessment: Patient is a 52 y.o. female with left-sided low back pain in the area of the SI joint and L5/S1 facets. She did not get any relief with SI injection so possibly coming from L5/S1 facets. Other potential etiology is L5/S1 DDD     Plan: -Patient has tried tylenol, ibuprofen, left SI joint injection -Recommended diagnostic/therapeutic L5/S1 facet injection -Patient has gotten relief with prior work place chair. Will provide her with prescription at our next visit  -Patient should return to office in 4 weeks, x-rays at next visit: none     Patient expressed understanding of the plan and all questions were answered to the patient's satisfaction.    ___________________________________________________________________________     History:   Patient is a 52 y.o. female who presents today for follow up on her lumbar spine. After our last visit, she got a left SI joint injection with Dr. Shon Baton. She got a little bit of relief with this injection. She said her pain went from a 8 to a 6 our of 10. This pain relief last for 2 days. Then, pain returned to an 8 our of 10. She feels the pain on a daily basis. She feels it regardless of activity. Still no pain radiating into either lower extremity. Pain is felt mostly on the left side of the lower back.    Treatments tried: tylenol, ibuprofen, left SI joint injection     Physical Exam:   General: no acute distress, appears stated age Neurologic: alert, answering questions appropriately, following commands Respiratory: unlabored breathing on room air, symmetric chest rise Psychiatric: appropriate affect, normal cadence to speech     MSK (spine):   -Strength exam                                                   Left                  Right EHL                              5/5                  5/5 TA                                 5/5                  5/5 GSC                             5/5                   5/5 Knee extension            5/5                  5/5 Hip flexion                    5/5                  5/5   -Sensory exam  Sensation intact to light touch in L3-S1 nerve distributions of bilateral lower extremities    Imaging: XR of the lumbar spine from 08/09/2023 was previously independently reviewed and interpreted, showing disc height loss at L5/S1. No other significant degenerative changes. No evidence of instability on flexion/extension views. No fracture or dislocation seen.   MRI of the pelvis from 06/19/2023 was reviewed and showed DDD at L5/S1 and facet arthropathy at that level    Patient name: Melanie Little Patient MRN: [id number] Date of visit: 09/09/23

## 2023-09-10 ENCOUNTER — Encounter: Payer: Self-pay | Admitting: Orthopedic Surgery

## 2023-09-11 ENCOUNTER — Encounter: Payer: Self-pay | Admitting: Radiology

## 2023-09-13 ENCOUNTER — Ambulatory Visit: Payer: Managed Care, Other (non HMO) | Admitting: Internal Medicine

## 2023-09-24 ENCOUNTER — Ambulatory Visit (HOSPITAL_BASED_OUTPATIENT_CLINIC_OR_DEPARTMENT_OTHER)
Admission: RE | Admit: 2023-09-24 | Discharge: 2023-09-24 | Disposition: A | Discharge status: Home / Self Care | Payer: Managed Care, Other (non HMO) | Source: Ambulatory Visit | Attending: Cardiovascular Disease | Admitting: Cardiovascular Disease

## 2023-09-24 DIAGNOSIS — E1169 Type 2 diabetes mellitus with other specified complication: Secondary | ICD-10-CM | POA: Insufficient documentation

## 2023-09-24 DIAGNOSIS — E669 Obesity, unspecified: Secondary | ICD-10-CM | POA: Insufficient documentation

## 2023-09-24 DIAGNOSIS — I1A0 Resistant hypertension: Secondary | ICD-10-CM | POA: Insufficient documentation

## 2023-09-24 DIAGNOSIS — G4733 Obstructive sleep apnea (adult) (pediatric): Secondary | ICD-10-CM | POA: Insufficient documentation

## 2023-09-24 DIAGNOSIS — I517 Cardiomegaly: Secondary | ICD-10-CM | POA: Insufficient documentation

## 2023-10-01 ENCOUNTER — Encounter (HOSPITAL_BASED_OUTPATIENT_CLINIC_OR_DEPARTMENT_OTHER): Payer: Self-pay | Admitting: Cardiovascular Disease

## 2023-10-01 NOTE — Telephone Encounter (Signed)
Please advise 

## 2023-10-15 ENCOUNTER — Ambulatory Visit (INDEPENDENT_AMBULATORY_CARE_PROVIDER_SITE_OTHER): Payer: Managed Care, Other (non HMO) | Admitting: Nurse Practitioner

## 2023-10-15 ENCOUNTER — Encounter: Payer: Self-pay | Admitting: Nurse Practitioner

## 2023-10-15 VITALS — BP 120/70 | HR 85 | Temp 98.6°F | Ht 62.0 in | Wt 181.0 lb

## 2023-10-15 DIAGNOSIS — E6609 Other obesity due to excess calories: Secondary | ICD-10-CM

## 2023-10-15 DIAGNOSIS — I10 Essential (primary) hypertension: Secondary | ICD-10-CM

## 2023-10-15 DIAGNOSIS — D352 Benign neoplasm of pituitary gland: Secondary | ICD-10-CM | POA: Diagnosis not present

## 2023-10-15 DIAGNOSIS — R5383 Other fatigue: Secondary | ICD-10-CM

## 2023-10-15 DIAGNOSIS — E1169 Type 2 diabetes mellitus with other specified complication: Secondary | ICD-10-CM

## 2023-10-15 DIAGNOSIS — L0292 Furuncle, unspecified: Secondary | ICD-10-CM

## 2023-10-15 DIAGNOSIS — Z2821 Immunization not carried out because of patient refusal: Secondary | ICD-10-CM

## 2023-10-15 DIAGNOSIS — Z23 Encounter for immunization: Secondary | ICD-10-CM | POA: Diagnosis not present

## 2023-10-15 DIAGNOSIS — Z6833 Body mass index (BMI) 33.0-33.9, adult: Secondary | ICD-10-CM

## 2023-10-15 DIAGNOSIS — E7800 Pure hypercholesterolemia, unspecified: Secondary | ICD-10-CM

## 2023-10-15 DIAGNOSIS — Z0000 Encounter for general adult medical examination without abnormal findings: Secondary | ICD-10-CM

## 2023-10-15 DIAGNOSIS — Z79899 Other long term (current) drug therapy: Secondary | ICD-10-CM

## 2023-10-15 DIAGNOSIS — E66811 Obesity, class 1: Secondary | ICD-10-CM

## 2023-10-15 LAB — POCT URINALYSIS DIP (CLINITEK)
Blood, UA: NEGATIVE
Glucose, UA: NEGATIVE mg/dL
Ketones, POC UA: NEGATIVE mg/dL
Leukocytes, UA: NEGATIVE
Nitrite, UA: NEGATIVE
POC PROTEIN,UA: NEGATIVE
Spec Grav, UA: >=1.03 — AB (ref 1.010–1.025)
Urobilinogen, UA: 0.2 U/dL
pH, UA: 6 (ref 5.0–8.0)

## 2023-10-15 MED ORDER — CEPHALEXIN 500 MG PO CAPS
500.0000 mg | ORAL_CAPSULE | Freq: Four times a day (QID) | ORAL | 0 refills | Status: AC
Start: 2023-10-15 — End: 2023-10-25

## 2023-10-15 MED ORDER — CABERGOLINE 0.5 MG PO TABS: 0.2500 mg | ORAL_TABLET | ORAL | 2 refills | Status: DC

## 2023-10-15 NOTE — Patient Instructions (Addendum)
Health Maintenance  Topic Date Due   Complete foot exam   04/17/2023   Yearly kidney health urinalysis for diabetes  10/10/2023   COVID-19 Vaccine (6 - 2023-24 season) 10/31/2023*   Flu Shot  03/30/2024*   Eye exam for diabetics  11/30/2023   Zoster (Shingles) Vaccine (2 of 2) 12/10/2023   Hemoglobin A1C  12/19/2023   Yearly kidney function blood test for diabetes  06/18/2024   Cologuard (Stool DNA test)  01/21/2025   Mammogram  06/06/2025   Pap with HPV screening  10/09/2025   DTaP/Tdap/Td vaccine (4 - Td or Tdap) 06/18/2033   Hepatitis C Screening  Completed   HIV Screening  Completed   HPV Vaccine  Aged Out  *Topic was postponed. The date shown is not the original due date.   Send [place] a message about your birth control and your mounjaro (if they have the 12.5 mg dose in stock)

## 2023-10-15 NOTE — Progress Notes (Signed)
Madelaine Bhat, CMA,acting as a Neurosurgeon for [name], FNP.,have documented all relevant documentation on the behalf of [name], FNP,as directed by  [name], FNP while in the presence of [name], FNP.  Subjective:    Patient ID: Melanie Little , female    DOB: Jul 27, 1971 , 52 y.o.   MRN: [id number]  No chief complaint on file.   HPI  Patient presents today for HM, Patient reports compliance with medication. Patient denies any chest pain, SOB, or headaches. Patient has no concerns today. Patient previously has an EKG at cardiologist. She has been to Dr. Duke Salvia. She has been to orthopedics for her back and has a f/u in November for her pain.   BP Readings from Last 3 Encounters: 10/15/23 : 120/70 08/09/23 : 131/85 08/08/23 : 111/71    Wt Readings from Last 3 Encounters: 10/15/23 : 181 lb (82.1 kg) 08/09/23 : 182 lb (82.6 kg) 08/08/23 : 184 lb 12.8 oz (83.8 kg)       Past Medical History:  Diagnosis Date   Abnormal EKG    Abnormal Pap smear of cervix about 2013, 2016   Colpo biopsy with normal result per pt.  colpo 2016 HPV changes   Diabetes mellitus without complication (HCC)    DUB (dysfunctional uterine bleeding)    around 2012, saw gyn at the time for eval   Elevated hemoglobin A1c    Fibroid    multiple fibroids   Hypertension    Left ovarian cyst 05/2018   do yearly ultrasound.  Doing GYN ONC consulation for monitoring.    Migraine    OSA on CPAP    Resistant hypertension 05/02/2021     Family History  Problem Relation Age of Onset   CVA Mother 16       deceased, was 70 at the age of her first stroke   Stroke Mother    Diabetes Father    Deep vein thrombosis Father    Breast cancer Maternal Aunt    Breast cancer Maternal Aunt    Sleep apnea Neg Hx      Current Outpatient Medications:    amLODipine (NORVASC) 5 MG tablet, TAKE 1 TABLET(5 MG) BY MOUTH DAILY, Disp: 90 tablet, Rfl: 3   Blood Glucose Monitoring Suppl (ONE TOUCH ULTRA MINI)  w/Device KIT, 1 each by Does not apply route 3 (three) times daily as needed., Disp: 1 kit, Rfl: 1   EPINEPHrine 0.3 mg/0.3 mL IJ SOAJ injection, Inject 0.3 mLs into the muscle as needed. For Anaphylaxis, Disp: , Rfl:    glucose blood test strip, Use as instructed, Disp: 100 each, Rfl: 12   hydrALAZINE (APRESOLINE) 25 MG tablet, TAKE 1 TABLET(25 MG) BY MOUTH TWICE DAILY, Disp: 180 tablet, Rfl: 3   Lancets (ONETOUCH ULTRASOFT) lancets, Use as instructed, Disp: 100 each, Rfl: 12   nebivolol (BYSTOLIC) 10 MG tablet, Take 1 tablet (10 mg total) by mouth daily., Disp: 30 tablet, Rfl: 5   norethindrone (NORLYDA) 0.35 MG tablet, TAKE 1 TABLET(0.35 MG) BY MOUTH DAILY, Disp: 84 tablet, Rfl: 4   Relugolix-Estradiol-Norethind (MYFEMBREE) 40-1-0.5 MG TABS, Take 1 tablet by mouth daily., Disp: 28 tablet, Rfl: 6   spironolactone (ALDACTONE) 25 MG tablet, Take 1 tablet (25 mg total) by mouth daily., Disp: 90 tablet, Rfl: 3   tirzepatide (MOUNJARO) 10 MG/0.5ML Pen, Inject 10 mg into the skin once a week., Disp: 3 mL, Rfl: 1   valsartan (DIOVAN) 160 MG tablet, Take 1 tablet (160 mg  total) by mouth daily., Disp: 90 tablet, Rfl: 3   cabergoline (DOSTINEX) 0.5 MG tablet, Take 0.5 tablets (0.25 mg total) by mouth 2 (two) times a week., Disp: 10 tablet, Rfl: 2   tirzepatide (MOUNJARO) 12.5 MG/0.5ML Pen, Inject 12.5 mg into the skin once a week., Disp: 2 mL, Rfl: 0   Allergies  Allergen Reactions   Sulfamethoxazole-Trimethoprim Other (See Comments), Hives, Itching and Swelling      The patient states she uses OCP (estrogen/progesterone) for birth control. Patient's last menstrual period was 09/24/2023.. Negative for Dysmenorrhea and Negative for Menorrhagia. Negative for: breast discharge, breast lump(s), breast pain and breast self exam. Associated symptoms include abnormal vaginal bleeding. Pertinent negatives include abnormal bleeding (hematology), anxiety, decreased libido, depression, difficulty falling sleep,  dyspareunia, history of infertility, nocturia, sexual dysfunction, sleep disturbances, urinary incontinence, urinary urgency, vaginal discharge and vaginal itching. Diet regular; she does not eat too much. The patient states her exercise level is none.   The patient's tobacco use is:  Social History   Tobacco Use  Smoking Status Never  Smokeless Tobacco Never  . She has been exposed to passive smoke. The patient's alcohol use is:  Social History   Substance and Sexual Activity  Alcohol Use No  Additional information: Last pap 10/09/2022, next one scheduled for 10/09/2025.    Review of Systems  Constitutional: Negative.   HENT: Negative.    Eyes: Negative.   Respiratory: Negative.    Cardiovascular: Negative.   Gastrointestinal: Negative.   Endocrine: Negative.   Genitourinary: Negative.   Musculoskeletal: Negative.   Skin: Negative.   Allergic/Immunologic: Negative.   Neurological: Negative.   Hematological: Negative.   Psychiatric/Behavioral: Negative.       Today's Vitals   10/15/23 1527  BP: 120/70  Pulse: 85  Temp: 98.6 F (37 C)  TempSrc: Oral  Weight: 181 lb (82.1 kg)  Height: 5\' 2"  (1.575 m)  PainSc: 6   PainLoc: Back   Body mass index is 33.11 kg/m.  Wt Readings from Last 3 Encounters:  10/15/23 181 lb (82.1 kg)  08/09/23 182 lb (82.6 kg)  08/08/23 184 lb 12.8 oz (83.8 kg)     Objective:  Physical Exam Vitals reviewed.  Constitutional:      General: She is not in acute distress.    Appearance: Normal appearance. She is well-developed. She is obese.  HENT:     Head: Normocephalic and atraumatic.     Right Ear: Hearing, tympanic membrane, ear canal and external ear normal. There is no impacted cerumen.     Left Ear: Hearing, tympanic membrane, ear canal and external ear normal. There is no impacted cerumen.     Nose: Nose normal.     Mouth/Throat:     Mouth: Mucous membranes are moist.  Eyes:     General: Lids are normal.     Extraocular  Movements: Extraocular movements intact.     Conjunctiva/sclera: Conjunctivae normal.     Pupils: Pupils are equal, round, and reactive to light.     Funduscopic exam:    Right eye: No papilledema.        Left eye: No papilledema.  Neck:     Thyroid: No thyroid mass.     Vascular: No carotid bruit.  Cardiovascular:     Rate and Rhythm: Normal rate and regular rhythm.     Pulses: Normal pulses.     Heart sounds: Normal heart sounds. No murmur heard. Pulmonary:     Effort: Pulmonary effort is  normal. No respiratory distress.     Breath sounds: Normal breath sounds. No wheezing.  Chest:     Chest wall: No mass.  Breasts:    Tanner Score is 5.     Right: Normal. No mass or tenderness.     Left: Normal. No mass or tenderness.  Abdominal:     General: Abdomen is flat. Bowel sounds are normal. There is no distension.     Palpations: Abdomen is soft.     Tenderness: There is no abdominal tenderness.  Musculoskeletal:        General: No swelling or tenderness. Normal range of motion.     Cervical back: Full passive range of motion without pain, normal range of motion and neck supple.     Right lower leg: No edema.     Left lower leg: No edema.  Lymphadenopathy:     Upper Body:     Right upper body: No supraclavicular, axillary or pectoral adenopathy.     Left upper body: No supraclavicular, axillary or pectoral adenopathy.  Skin:    General: Skin is warm and dry.     Capillary Refill: Capillary refill takes less than 2 seconds.  Neurological:     General: No focal deficit present.     Mental Status: She is alert and oriented to person, place, and time.     Cranial Nerves: No cranial nerve deficit.     Sensory: No sensory deficit.     Motor: No weakness.  Psychiatric:        Mood and Affect: Mood normal.        Behavior: Behavior normal.        Thought Content: Thought content normal.        Judgment: Judgment normal.      Title   Diabetic Foot Exam - detailed Date & Time:  10/15/2023  4:15 PM Diabetic Foot exam was performed with the following findings: Yes  Visual Foot Exam completed.: Yes  Is there a history of foot ulcer?: No Is there a foot ulcer now?: No Is there swelling?: No Is there elevated skin temperature?: No Is there abnormal foot shape?: No Is there a claw toe deformity?: No Are the toenails long?: No Are the toenails thick?: No Are the toenails ingrown?: No Is the skin thin, fragile, shiny and hairless?": No Normal Range of Motion?: Yes Is there foot or ankle muscle weakness?: No Do you have pain in calf while walking?: No Are the shoes appropriate in style and fit?: Yes Can the patient see the bottom of their feet?: Yes Pulse Foot Exam completed.: Yes   Right Posterior Tibialis: Present Left posterior Tibialis: Present   Right Dorsalis Pedis: Present Left Dorsalis Pedis: Present     Sensory Foot Exam Completed.: Yes Semmes-Weinstein Monofilament Test "+" means "has sensation" and "-" means "no sensation"   R Site 1-Great Toe: Pos L Site 1-Great Toe: Pos   R Site 4: Pos L Site 4: Pos   R Site 6: Pos L Site 6: Pos     Image components are not supported.   Image components are not supported. Image components are not supported.  [place] Comments        Assessment And Plan:     Encounter for annual health examination Assessment & Plan: Behavior modifications discussed and diet history reviewed.   Pt will continue to exercise regularly and modify diet with low GI, plant based foods and decrease intake of processed foods.  Recommend  intake of daily multivitamin, Vitamin D, and calcium.  Recommend mammogram and colonoscopy for preventive screenings, as well as recommend immunizations that include influenza (declined), TDAP, and Shingles    Elevated cholesterol Assessment & Plan: Cholesterol levels are stable. Diet controlled, encouraged to eat a low fat diet.   Orders: -     Lipid panel  Type 2 diabetes mellitus  with obesity (HCC) Assessment & Plan: Hgba1c was slightly elevated at last visit, continue current medications.   Orders: -     POCT URINALYSIS DIP (CLINITEK) -     Microalbumin / creatinine urine ratio -     CMP14+EGFR -     Hemoglobin A1c  Primary hypertension Assessment & Plan: Blood pressure is well controlled, continue current medications.   Orders: -     CMP14+EGFR  Influenza vaccination declined Assessment & Plan: Patient declined influenza vaccination at this time. Patient is aware that influenza vaccine prevents illness in 70% of healthy people, and reduces hospitalizations to 30-70% in elderly. This vaccine is recommended annually. Education has been provided regarding the importance of this vaccine but patient still declined. Advised may receive this vaccine at local pharmacy or Health Dept.or vaccine clinic. Aware to provide a copy of the vaccination record if obtained from local pharmacy or Health Dept.  Pt is willing to accept risk associated with refusing vaccination.    Need for zoster vaccination Assessment & Plan: Received first shingrix, repeat in 2-6 months  Orders: -     Varicella-zoster vaccine IM  COVID-19 vaccination declined Assessment & Plan: Declines covid 19 vaccine. Discussed risk of covid 51 and if she changes her mind about the vaccine to call the office. Education has been provided regarding the importance of this vaccine but patient still declined. Advised may receive this vaccine at local pharmacy or Health Dept.or vaccine clinic. Aware to provide a copy of the vaccination record if obtained from local pharmacy or Health Dept.  Encouraged to take multivitamin, vitamin d, vitamin c and zinc to increase immune system. Aware can call office if would like to have vaccine here at office. Verbalized acceptance and understanding.    Class 1 obesity due to excess calories with serious comorbidity and body mass index (BMI) of 33.0 to 33.9 in  adult Assessment & Plan: She is encouraged to strive for BMI less than 30 to decrease cardiac risk. Advised to aim for at least 150 minutes of exercise per week.    Other long term (current) drug therapy -     CBC with Differential/Platelet  Pituitary microadenoma [facility]) Assessment & Plan: Continue current medications.   Orders: -     Cabergoline; Take 0.5 tablets (0.25 mg total) by mouth 2 (two) times a week.  Dispense: 10 tablet; Refill: 2  Boil Assessment & Plan: Area between breast is raised and slightly tender. Will treat with antibiotic. Encouraged to apply warm compresses  Orders: -     Cephalexin; Take 1 capsule (500 mg total) by mouth 4 (four) times daily for 10 days.  Dispense: 40 capsule; Refill: 0  Decreased energy Assessment & Plan: Will check for metabolic causes.  Orders: -     Vitamin B12     Return for 1 year physical, controlled DM check 4 months. Patient was given opportunity to ask questions. Patient verbalized understanding of the plan and was able to repeat key elements of the plan. All questions were answered to their satisfaction.   [name], FNP  I, [name], FNP, have  reviewed all documentation for this visit. The documentation on 10/15/23 for the exam, diagnosis, procedures, and orders are all accurate and complete.

## 2023-10-16 ENCOUNTER — Other Ambulatory Visit: Payer: Self-pay

## 2023-10-16 LAB — CMP14+EGFR
ALT: 10 IU/L (ref 0–32)
AST: 14 IU/L (ref 0–40)
Albumin: 4.6 g/dL (ref 3.9–4.9)
Alkaline Phosphatase: 69 IU/L (ref 44–121)
BUN/Creatinine Ratio: 8 — ABNORMAL LOW (ref 9–23)
BUN: 7 mg/dL (ref 6–24)
Bilirubin Total: 1 mg/dL (ref 0.0–1.2)
CO2: 17 mmol/L — ABNORMAL LOW (ref 20–29)
Calcium: 9.4 mg/dL (ref 8.7–10.2)
Chloride: 104 mmol/L (ref 96–106)
Creatinine, Ser: 0.92 mg/dL (ref 0.57–1.00)
Globulin, Total: 2.9 g/dL (ref 1.5–4.5)
Glucose: 68 mg/dL — ABNORMAL LOW (ref 70–99)
Potassium: 4.1 mmol/L (ref 3.5–5.2)
Sodium: 140 mmol/L (ref 134–144)
Total Protein: 7.5 g/dL (ref 6.0–8.5)
eGFR: 76 mL/min/1.73

## 2023-10-16 LAB — LIPID PANEL
Chol/HDL Ratio: 3.4 {ratio} (ref 0.0–4.4)
Cholesterol, Total: 175 mg/dL (ref 100–199)
HDL: 51 mg/dL (ref >39)
LDL Chol Calc (NIH): 96 mg/dL (ref 0–99)
Triglycerides: 160 mg/dL — ABNORMAL HIGH (ref 0–149)
VLDL Cholesterol Cal: 28 mg/dL (ref 5–40)

## 2023-10-16 LAB — CBC WITH DIFFERENTIAL/PLATELET
Basophils Absolute: 0.1 10*3/uL (ref 0.0–0.2)
Basos: 1 %
EOS (ABSOLUTE): 0.2 10*3/uL (ref 0.0–0.4)
Eos: 2 %
Hematocrit: 38.5 % (ref 34.0–46.6)
Hemoglobin: 12.3 g/dL (ref 11.1–15.9)
Immature Grans (Abs): 0 10*3/uL (ref 0.0–0.1)
Immature Granulocytes: 0 %
Lymphocytes Absolute: 2.4 10*3/uL (ref 0.7–3.1)
Lymphs: 27 %
MCH: 22.6 pg — ABNORMAL LOW (ref 26.6–33.0)
MCHC: 31.9 g/dL (ref 31.5–35.7)
MCV: 71 fL — ABNORMAL LOW (ref 79–97)
Monocytes Absolute: 0.6 10*3/uL (ref 0.1–0.9)
Monocytes: 7 %
Neutrophils Absolute: 5.8 10*3/uL (ref 1.4–7.0)
Neutrophils: 63 %
Platelets: 418 10*3/uL (ref 150–450)
RBC: 5.45 x10E6/uL — ABNORMAL HIGH (ref 3.77–5.28)
RDW: 17.4 % — ABNORMAL HIGH (ref 11.7–15.4)
WBC: 9.1 10*3/uL (ref 3.4–10.8)

## 2023-10-16 LAB — MICROALBUMIN / CREATININE URINE RATIO
Creatinine, Urine: 224.4 mg/dL
Microalb/Creat Ratio: 4 mg/g{creat} (ref 0–29)
Microalbumin, Urine: 9.3 ug/mL

## 2023-10-16 LAB — HEMOGLOBIN A1C
Est. average glucose Bld gHb Est-mCnc: 114 mg/dL
Hgb A1c MFr Bld: 5.6 % (ref 4.8–5.6)

## 2023-10-16 LAB — VITAMIN B12: Vitamin B-12: 808 pg/mL (ref 232–1245)

## 2023-10-17 ENCOUNTER — Other Ambulatory Visit: Payer: Self-pay | Admitting: Nurse Practitioner

## 2023-10-17 ENCOUNTER — Encounter: Payer: Self-pay | Admitting: Nurse Practitioner

## 2023-10-17 DIAGNOSIS — E1169 Type 2 diabetes mellitus with other specified complication: Secondary | ICD-10-CM

## 2023-10-21 ENCOUNTER — Other Ambulatory Visit: Payer: Self-pay

## 2023-10-21 MED ORDER — TIRZEPATIDE 12.5 MG/0.5ML ~~LOC~~ SOAJ: 12.5000 mg | SUBCUTANEOUS | 0 refills | Status: DC

## 2023-10-27 DIAGNOSIS — Z2821 Immunization not carried out because of patient refusal: Secondary | ICD-10-CM | POA: Insufficient documentation

## 2023-10-27 DIAGNOSIS — Z0000 Encounter for general adult medical examination without abnormal findings: Secondary | ICD-10-CM | POA: Insufficient documentation

## 2023-10-27 DIAGNOSIS — Z23 Encounter for immunization: Secondary | ICD-10-CM | POA: Insufficient documentation

## 2023-10-27 DIAGNOSIS — L0292 Furuncle, unspecified: Secondary | ICD-10-CM | POA: Insufficient documentation

## 2023-10-27 DIAGNOSIS — R5383 Other fatigue: Secondary | ICD-10-CM | POA: Insufficient documentation

## 2023-10-27 NOTE — Assessment & Plan Note (Signed)
Continue current medications. 

## 2023-10-27 NOTE — Assessment & Plan Note (Signed)
Received first shingrix, repeat in 2-6 months

## 2023-10-27 NOTE — Assessment & Plan Note (Signed)
Behavior modifications discussed and diet history reviewed.   Pt will continue to exercise regularly and modify diet with low GI, plant based foods and decrease intake of processed foods.  Recommend intake of daily multivitamin, Vitamin D, and calcium.  Recommend mammogram and colonoscopy for preventive screenings, as well as recommend immunizations that include influenza (declined), TDAP, and Shingles

## 2023-10-27 NOTE — Assessment & Plan Note (Signed)
Area between breast is raised and slightly tender. Will treat with antibiotic. Encouraged to apply warm compresses

## 2023-10-27 NOTE — Assessment & Plan Note (Signed)
Will check for metabolic causes.

## 2023-10-27 NOTE — Assessment & Plan Note (Signed)

## 2023-10-27 NOTE — Assessment & Plan Note (Signed)
She is encouraged to strive for BMI less than 30 to decrease cardiac risk. Advised to aim for at least 150 minutes of exercise per week.

## 2023-10-27 NOTE — Assessment & Plan Note (Signed)
Blood pressure is well controlled, continue current medications.

## 2023-10-27 NOTE — Assessment & Plan Note (Signed)

## 2023-10-27 NOTE — Assessment & Plan Note (Addendum)
Hgba1c was slightly elevated at last visit, continue current medications. Diabetes foot exam done.

## 2023-10-27 NOTE — Assessment & Plan Note (Signed)
Cholesterol levels are stable. Diet controlled, encouraged to eat a low fat diet.

## 2023-11-06 ENCOUNTER — Other Ambulatory Visit (HOSPITAL_BASED_OUTPATIENT_CLINIC_OR_DEPARTMENT_OTHER): Payer: Self-pay | Admitting: Cardiovascular Disease

## 2023-11-06 DIAGNOSIS — I10 Essential (primary) hypertension: Secondary | ICD-10-CM

## 2023-11-12 ENCOUNTER — Telehealth: Payer: Self-pay

## 2023-11-12 NOTE — Telephone Encounter (Signed)
Patient wanted to cancel her Esi  injection. She changed her mind and would like to discuss other options. Can you please advise ?

## 2023-11-14 NOTE — Telephone Encounter (Signed)
I called and lmom that if she would like to discuss her options to please call our office back and schedule appt with Dr. Christell Constant.

## 2023-11-17 ENCOUNTER — Other Ambulatory Visit: Payer: Self-pay | Admitting: Nurse Practitioner

## 2023-11-18 ENCOUNTER — Other Ambulatory Visit: Payer: Self-pay | Admitting: Nurse Practitioner

## 2023-11-18 ENCOUNTER — Encounter: Payer: Managed Care, Other (non HMO) | Admitting: Physical Medicine and Rehabilitation

## 2023-11-18 MED ORDER — TIRZEPATIDE 12.5 MG/0.5ML ~~LOC~~ SOAJ: 12.5000 mg | SUBCUTANEOUS | 0 refills | Status: DC

## 2023-11-18 NOTE — Telephone Encounter (Signed)
I sent it in 

## 2023-12-06 ENCOUNTER — Other Ambulatory Visit: Payer: Self-pay | Admitting: Obstetrics and Gynecology

## 2023-12-06 DIAGNOSIS — Z3041 Encounter for surveillance of contraceptive pills: Secondary | ICD-10-CM

## 2023-12-06 LAB — HM DIABETES EYE EXAM

## 2023-12-09 ENCOUNTER — Encounter: Payer: Self-pay | Admitting: Obstetrics and Gynecology

## 2023-12-10 ENCOUNTER — Telehealth: Payer: Self-pay | Admitting: Lactation Services

## 2023-12-10 MED ORDER — NORETHINDRONE 0.35 MG PO TABS: 1.0000 | ORAL_TABLET | Freq: Every day | ORAL | 5 refills | Status: DC

## 2023-12-10 NOTE — Telephone Encounter (Signed)
Received medication refill request for Norethindrone refill. Per chart review, Dr. Briscoe Deutscher sent message yesterday to see if taking both Norethindrone and Myfembree.   Called and spoke with patient, she is only taking Norethindrone as Myfembree not covered by insurance.   Reviewed with patient it is time for her annual exam and to call the office to schedule. Reviewed I will send in refills until she can come in for her appointment. Patient voiced understanding.

## 2024-02-05 ENCOUNTER — Other Ambulatory Visit: Payer: Self-pay | Admitting: Nurse Practitioner

## 2024-02-05 DIAGNOSIS — D352 Benign neoplasm of pituitary gland: Secondary | ICD-10-CM

## 2024-02-12 ENCOUNTER — Encounter (HOSPITAL_BASED_OUTPATIENT_CLINIC_OR_DEPARTMENT_OTHER): Payer: Managed Care, Other (non HMO) | Admitting: Cardiovascular Disease

## 2024-02-17 ENCOUNTER — Encounter: Payer: Self-pay | Admitting: Nurse Practitioner

## 2024-02-17 ENCOUNTER — Ambulatory Visit: Payer: Managed Care, Other (non HMO) | Admitting: Nurse Practitioner

## 2024-02-17 VITALS — BP 130/76 | HR 85 | Temp 98.6°F | Ht 62.0 in | Wt 179.4 lb

## 2024-02-17 DIAGNOSIS — E1169 Type 2 diabetes mellitus with other specified complication: Secondary | ICD-10-CM | POA: Diagnosis not present

## 2024-02-17 DIAGNOSIS — D352 Benign neoplasm of pituitary gland: Secondary | ICD-10-CM

## 2024-02-17 DIAGNOSIS — E669 Obesity, unspecified: Secondary | ICD-10-CM

## 2024-02-17 DIAGNOSIS — E66811 Obesity, class 1: Secondary | ICD-10-CM

## 2024-02-17 DIAGNOSIS — Z6832 Body mass index (BMI) 32.0-32.9, adult: Secondary | ICD-10-CM

## 2024-02-17 DIAGNOSIS — Z2821 Immunization not carried out because of patient refusal: Secondary | ICD-10-CM | POA: Insufficient documentation

## 2024-02-17 DIAGNOSIS — L0292 Furuncle, unspecified: Secondary | ICD-10-CM

## 2024-02-17 DIAGNOSIS — L720 Epidermal cyst: Secondary | ICD-10-CM

## 2024-02-17 DIAGNOSIS — I10 Essential (primary) hypertension: Secondary | ICD-10-CM

## 2024-02-17 DIAGNOSIS — E6609 Other obesity due to excess calories: Secondary | ICD-10-CM

## 2024-02-17 MED ORDER — AMOXICILLIN-POT CLAVULANATE 875-125 MG PO TABS: 1.0000 | ORAL_TABLET | Freq: Two times a day (BID) | ORAL | 0 refills | Status: DC

## 2024-02-17 MED ORDER — DOXYCYCLINE HYCLATE 50 MG PO CAPS
50.0000 mg | ORAL_CAPSULE | Freq: Every day | ORAL | 1 refills | Status: DC
Start: 2024-02-17 — End: 2024-10-27

## 2024-02-17 MED ORDER — TIRZEPATIDE 12.5 MG/0.5ML ~~LOC~~ SOAJ: 12.5000 mg | SUBCUTANEOUS | 1 refills | Status: DC

## 2024-02-17 NOTE — Progress Notes (Signed)
 Melanie Little, CMA,acting as a Neurosurgeon for Melanie Felts, FNP.,have documented all relevant documentation on the behalf of Melanie Felts, FNP,as directed by  Melanie Felts, FNP while in the presence of Melanie Felts, FNP.  Subjective:  Patient ID: Melanie Little , female    DOB: [birth year] , 53 y.o.   MRN: [id number]  Chief Complaint  Patient presents with   Hypertension    HPI  Patient presents today for a bp and dm follow up, Patient reports compliance with medication. Patient denies any chest pain, SOB, or headaches. Patient reports she has a small cyst under her armpit and was usually treated with augmentin. She will get them often and will take augmentin.       Past Medical History:  Diagnosis Date   Abnormal EKG    Abnormal Pap smear of cervix about 2013, 2016   Colpo biopsy with normal result per pt.  colpo 2016 HPV changes   Diabetes mellitus without complication (HCC)    DUB (dysfunctional uterine bleeding)    around 2012, saw gyn at the time for eval   Elevated hemoglobin A1c    Fibroid    multiple fibroids   Hypertension    Left ovarian cyst 05/2018   do yearly ultrasound.  Doing GYN ONC consulation for monitoring.    Migraine    OSA on CPAP    Resistant hypertension 05/02/2021     Family History  Problem Relation Age of Onset   CVA Mother 70       deceased, was 75 at the age of her first stroke   Stroke Mother    Diabetes Father    Deep vein thrombosis Father    Breast cancer Maternal Aunt    Breast cancer Maternal Aunt    Sleep apnea Neg Hx      Current Outpatient Medications:    amLODipine (NORVASC) 5 MG tablet, TAKE 1 TABLET(5 MG) BY MOUTH DAILY, Disp: 90 tablet, Rfl: 3   amoxicillin-clavulanate (AUGMENTIN) 875-125 MG tablet, Take 1 tablet by mouth 2 (two) times daily., Disp: 14 tablet, Rfl: 0   Blood Glucose Monitoring Suppl (ONE TOUCH ULTRA MINI) w/Device KIT, 1 each by Does not apply route 3 (three) times daily as needed., Disp: 1 kit, Rfl: 1    cabergoline (DOSTINEX) 0.5 MG tablet, TAKE 1/2 TABLET BY MOUTH 2 TIMES A WEEK, Disp: 8 tablet, Rfl: 0   doxycycline (VIBRAMYCIN) 50 MG capsule, Take 1 capsule (50 mg total) by mouth daily., Disp: 90 capsule, Rfl: 1   EPINEPHrine 0.3 mg/0.3 mL IJ SOAJ injection, Inject 0.3 mLs into the muscle as needed. For Anaphylaxis, Disp: , Rfl:    glucose blood test strip, Use as instructed, Disp: 100 each, Rfl: 12   hydrALAZINE (APRESOLINE) 25 MG tablet, TAKE 1 TABLET(25 MG) BY MOUTH TWICE DAILY, Disp: 180 tablet, Rfl: 3   Lancets (ONETOUCH ULTRASOFT) lancets, Use as instructed, Disp: 100 each, Rfl: 12   nebivolol (BYSTOLIC) 10 MG tablet, TAKE 1 TABLET(10 MG) BY MOUTH DAILY, Disp: 30 tablet, Rfl: 5   norethindrone (MICRONOR) 0.35 MG tablet, Take 1 tablet (0.35 mg total) by mouth daily., Disp: 28 tablet, Rfl: 5   norethindrone (NORLYDA) 0.35 MG tablet, TAKE 1 TABLET(0.35 MG) BY MOUTH DAILY, Disp: 84 tablet, Rfl: 4   spironolactone (ALDACTONE) 25 MG tablet, Take 1 tablet (25 mg total) by mouth daily., Disp: 90 tablet, Rfl: 3   valsartan (DIOVAN) 160 MG tablet, Take 1 tablet (160 mg total) by mouth daily., Disp:  90 tablet, Rfl: 3   tirzepatide (MOUNJARO) 12.5 MG/0.5ML Pen, Inject 12.5 mg into the skin once a week., Disp: 6 mL, Rfl: 1   Allergies  Allergen Reactions   Sulfamethoxazole-Trimethoprim Other (See Comments), Hives, Itching and Swelling     Review of Systems  Constitutional: Negative.   Respiratory: Negative.    Cardiovascular: Negative.  Negative for chest pain, palpitations and leg swelling.  Musculoskeletal:  Negative for arthralgias.  Neurological: Negative.   Psychiatric/Behavioral: Negative.       Today's Vitals   02/17/24 1623  BP: 130/76  Pulse: 85  Temp: 98.6 F (37 C)  TempSrc: Oral  Weight: 179 lb 6.4 oz (81.4 kg)  Height: 5\' 2"  (1.575 m)  PainSc: 0-No pain   Body mass index is 32.81 kg/m.  Wt Readings from Last 3 Encounters:  02/17/24 179 lb 6.4 oz (81.4 kg)  10/15/23  181 lb (82.1 kg)  08/09/23 182 lb (82.6 kg)     Objective:  Physical Exam Vitals reviewed.  Constitutional:      General: She is not in acute distress.    Appearance: Normal appearance. She is well-developed. She is obese.  Cardiovascular:     Rate and Rhythm: Normal rate and regular rhythm.     Pulses: Normal pulses.     Heart sounds: Normal heart sounds. No murmur heard. Pulmonary:     Effort: Pulmonary effort is normal. No respiratory distress.     Breath sounds: Normal breath sounds. No wheezing.  Chest:     Chest wall: No tenderness.  Musculoskeletal:        General: Normal range of motion.  Skin:    General: Skin is warm and dry.     Capillary Refill: Capillary refill takes less than 2 seconds.  Neurological:     General: No focal deficit present.     Mental Status: She is alert and oriented to person, place, and time.     Cranial Nerves: No cranial nerve deficit.     Motor: No weakness.  Psychiatric:        Mood and Affect: Mood normal.        Behavior: Behavior normal.        Thought Content: Thought content normal.        Judgment: Judgment normal.         Assessment And Plan:  Type 2 diabetes mellitus with obesity (HCC) Assessment & Plan: Hgba1c was slightly elevated at last visit, continue current medications.   Orders: -     Hemoglobin A1c -     Tirzepatide; Inject 12.5 mg into the skin once a week.  Dispense: 6 mL; Refill: 1  Primary hypertension Assessment & Plan: Blood pressure is well controlled, continue current medications.   Orders: -     BMP8+eGFR  Pituitary microadenoma [facility]) Assessment & Plan: Will recheck prolactin level  Orders: -     Prolactin  Epidermal cyst Assessment & Plan: These are occurring between her breast and bilateral axilla. Will treat with low dose doxycycline to hopefully avoid having frequent exacerbations  Orders: -     Doxycycline Hyclate; Take 1 capsule (50 mg total) by mouth daily.  Dispense: 90 capsule;  Refill: 1 -     Amoxicillin-Pot Clavulanate; Take 1 tablet by mouth 2 (two) times daily.  Dispense: 14 tablet; Refill: 0  Herpes zoster vaccination declined Assessment & Plan: Declines shingrix, educated on disease process and is aware if he changes his mind to notify office  COVID-19 vaccination declined Assessment & Plan: Declines covid 19 vaccine. Discussed risk of covid 42 and if she changes her mind about the vaccine to call the office. Education has been provided regarding the importance of this vaccine but patient still declined. Advised may receive this vaccine at local pharmacy or Health Dept.or vaccine clinic. Aware to provide a copy of the vaccination record if obtained from local pharmacy or Health Dept.  Encouraged to take multivitamin, vitamin d, vitamin c and zinc to increase immune system. Aware can call office if would like to have vaccine here at office. Verbalized acceptance and understanding.    Class 1 obesity due to excess calories with serious comorbidity and body mass index (BMI) of 32.0 to 32.9 in adult Assessment & Plan: She is encouraged to strive for BMI less than 30 to decrease cardiac risk. Advised to aim for at least 150 minutes of exercise per week.    Recurrent boils Assessment & Plan: These are occurring between her breast and bilateral axilla. Will treat with low dose doxycycline to hopefully avoid having frequent exacerbations     Return for controlled DM check 4 months.  Patient was given opportunity to ask questions. Patient verbalized understanding of the plan and was able to repeat key elements of the plan. All questions were answered to their satisfaction.    Jeanell Sparrow, FNP, have reviewed all documentation for this visit. The documentation on 02/17/24 for the exam, diagnosis, procedures, and orders are all accurate and complete.   IF YOU HAVE BEEN REFERRED TO A SPECIALIST, IT MAY TAKE 1-2 WEEKS TO SCHEDULE/PROCESS THE REFERRAL. IF YOU  HAVE NOT HEARD FROM US/SPECIALIST IN TWO WEEKS, PLEASE GIVE [place] A CALL AT [phone].

## 2024-02-18 LAB — HEMOGLOBIN A1C
Est. average glucose Bld gHb Est-mCnc: 120 mg/dL
Hgb A1c MFr Bld: 5.8 % — ABNORMAL HIGH (ref 4.8–5.6)

## 2024-02-18 LAB — BMP8+EGFR
BUN/Creatinine Ratio: 8 — ABNORMAL LOW (ref 9–23)
BUN: 6 mg/dL (ref 6–24)
CO2: 23 mmol/L (ref 20–29)
Calcium: 9.1 mg/dL (ref 8.7–10.2)
Chloride: 102 mmol/L (ref 96–106)
Creatinine, Ser: 0.8 mg/dL (ref 0.57–1.00)
Glucose: 82 mg/dL (ref 70–99)
Potassium: 4.2 mmol/L (ref 3.5–5.2)
Sodium: 137 mmol/L (ref 134–144)
eGFR: 90 mL/min/{1.73_m2} (ref >59)

## 2024-02-18 LAB — PROLACTIN: Prolactin: 55.3 ng/mL — ABNORMAL HIGH (ref 4.8–33.4)

## 2024-02-18 NOTE — Progress Notes (Unsigned)
 53 y.o. [id number] Married [place] American female here for NEW GYN/annual exam.    Taking Micronor. When she has her menstrual cycle, her bleeding is heavy enough to wear a diaper.   Patient skipped cycles and was dx with a prolactinoma.  Taking Dostinex twice a week.  She has seen a neurosurgeon regarding the prolactinoma, who initially prescribed the medication.  Surgery was not recommended.   Has pelvic pressure and burning on the lining of her lower abdomen.   It is there most of the time.   Patient saw Dr. Briscoe Deutscher regarding her fibroids, and Lupron was not possible as insurance would not cover this.   She has had consultation with interventional radiology for potential uterine artery embolization.  This left her with a lot of doubts about this procedure.   She is currently declining hysterectomy.   Received medication from her PCP to treat hidradenitis.   Partner has bladder cancer and had surgery.   PCP: [name], FNP   Patient's last menstrual period was 01/23/2024.           Sexually active: Yes.    The current method of family planning is oral progesterone-only contraceptive.    Menopausal hormone therapy:  n/a Exercising: No.   Smoker:  no  OB History  Gravida Para Term Preterm AB Living  4 1 1  0 3 1  SAB IAB Ectopic Multiple Live Births  0 3 0 0 1    # Outcome Date GA Lbr Len/2nd Weight Sex Type Anes PTL Lv  4 Term 05/11/97 [redacted]w[redacted]d   M Vag-Spont  N LIV  3 IAB           2 IAB           1 IAB              HEALTH MAINTENANCE: Last 2 paps:  10/09/22 neg, 09/25/19 neg: HR HPV neg History of abnormal Pap or positive HPV:  no Mammogram:   06/07/23 Breast Density Cat B, BI-RADS CAT 1 neg Colonoscopy:  cologuard unsure when, she will check the date on this.  Bone Density:  n/a  Result  n/a   Immunization History  Administered Date(s) Administered   Moderna Covid-19 Vaccine Bivalent Booster 33yrs & up 09/29/2022   PFIZER(Purple Top)SARS-COV-2 Vaccination  02/13/2020, 03/09/2020, 09/25/2020, 09/07/2021   Tdap 12/31/2009, 07/15/2012, 06/19/2023   Zoster Recombinant(Shingrix) 10/15/2023      reports that she has never smoked. She has never used smokeless tobacco. She reports that she does not drink alcohol and does not use drugs.  Past Medical History:  Diagnosis Date   Abnormal EKG    Abnormal Pap smear of cervix about 2013, 2016   Colpo biopsy with normal result per pt.  colpo 2016 HPV changes   Diabetes mellitus without complication (HCC)    DUB (dysfunctional uterine bleeding)    around 2012, saw gyn at the time for eval   Elevated hemoglobin A1c    Fibroid    multiple fibroids   Hypertension    Left ovarian cyst 05/2018   do yearly ultrasound.  Doing GYN ONC consulation for monitoring.    Migraine    OSA on CPAP    Pituitary adenoma (HCC) 02/09/2023   MIR - 8 x 5 mm   Resistant hypertension 05/02/2021    Past Surgical History:  Procedure Laterality Date   BREAST REDUCTION SURGERY  2005   CHOLECYSTECTOMY  2004   EYE SURGERY     INCISION  AND DRAINAGE Bilateral 2015   Bilateral axillary region ebaceous cyst   IR RADIOLOGIST EVAL & MGMT  09/14/2022   IR RADIOLOGIST EVAL & MGMT  11/08/2022   MASS EXCISION N/A 12/02/2020   Procedure: EXCISION OF TWO MIDLINE BACK SUBCUTANEOUS MASSES;  Surgeon: Quentin Ore, MD;  Location: [facility];  Service: General;  Laterality: N/A;   REDUCTION MAMMAPLASTY Bilateral 2005    Current Outpatient Medications  Medication Sig Dispense Refill   amLODipine (NORVASC) 5 MG tablet TAKE 1 TABLET(5 MG) BY MOUTH DAILY 90 tablet 3   amoxicillin-clavulanate (AUGMENTIN) 875-125 MG tablet Take 1 tablet by mouth 2 (two) times daily. 14 tablet 0   Blood Glucose Monitoring Suppl (ONE TOUCH ULTRA MINI) w/Device KIT 1 each by Does not apply route 3 (three) times daily as needed. 1 kit 1   cabergoline (DOSTINEX) 0.5 MG tablet TAKE 1/2 TABLET BY MOUTH 2 TIMES A WEEK 8 tablet 0    doxycycline (VIBRAMYCIN) 50 MG capsule Take 1 capsule (50 mg total) by mouth daily. 90 capsule 1   EPINEPHrine 0.3 mg/0.3 mL IJ SOAJ injection Inject 0.3 mLs into the muscle as needed. For Anaphylaxis     glucose blood test strip Use as instructed 100 each 12   hydrALAZINE (APRESOLINE) 25 MG tablet TAKE 1 TABLET(25 MG) BY MOUTH TWICE DAILY 180 tablet 3   Lancets (ONETOUCH ULTRASOFT) lancets Use as instructed 100 each 12   nebivolol (BYSTOLIC) 10 MG tablet TAKE 1 ZOXWRU(04 MG) BY MOUTH DAILY 30 tablet 5   spironolactone (ALDACTONE) 25 MG tablet Take 1 tablet (25 mg total) by mouth daily. 90 tablet 3   tirzepatide (MOUNJARO) 12.5 MG/0.5ML Pen Inject 12.5 mg into the skin once a week. 6 mL 1   valsartan (DIOVAN) 160 MG tablet Take 1 tablet (160 mg total) by mouth daily. 90 tablet 3   norethindrone (MICRONOR) 0.35 MG tablet Take 1 tablet (0.35 mg total) by mouth daily. 84 tablet 3   No current facility-administered medications for this visit.    ALLERGIES: Sulfamethoxazole-trimethoprim  Family History  Problem Relation Age of Onset   CVA Mother 102       deceased, was 71 at the age of her first stroke   Stroke Mother    Diabetes Father    Deep vein thrombosis Father    Breast cancer Maternal Aunt    Breast cancer Maternal Aunt    Sleep apnea Neg Hx     Review of Systems  All other systems reviewed and are negative.   PHYSICAL EXAM:  BP 128/76 (BP Location: Right Arm, Patient Position: Sitting, Cuff Size: Small)   Pulse 88   Ht 5' 4.5" (1.638 m)   Wt 175 lb (79.4 kg)   LMP 01/23/2024   SpO2 100%   BMI 29.57 kg/m     General appearance: alert, cooperative and appears stated age Head: normocephalic, without obvious abnormality, atraumatic Neck: no adenopathy, supple, symmetrical, trachea midline and thyroid normal to inspection and palpation Lungs: clear to auscultation bilaterally Breasts: keloid noted on scar formation and boils, no masses or tenderness, No nipple retraction or  dimpling, No nipple discharge or bleeding, No axillary adenopathy Heart: regular rate and rhythm Abdomen: soft, non-tender; uterus to 3 - 4 cm above the umbilicus on the left.   Extremities: extremities normal, atraumatic, no cyanosis or edema Skin: skin color, texture, turgor normal. Keloids noted.  Lymph nodes: cervical, supraclavicular, and axillary nodes normal. Neurologic: grossly normal  Pelvic: External genitalia:  no lesions              No abnormal inguinal nodes palpated.              Urethra:  normal appearing urethra with no masses, tenderness or lesions              Bartholins and Skenes: normal                 Vagina: normal appearing vagina with normal color and discharge, no lesions              Cervix: no lesions              Pap taken: no Bimanual Exam:  Uterus:  23 week size uterus extending from hip to hip.              Adnexa: no mass, fullness, tenderness              Rectal exam: yes.  Confirms.              Anus:  normal sphincter tone, no lesions  Chaperone was present for exam:  Warren Lacy, CMA  ASSESSMENT: Well woman visit with gynecologic exam. Fibroids.  23 week size uterus.  Increase in size since last office visit.  Hx large left ovarian cyst.  Resolved on [place] 05/24/20.  Hx CIN I. Keloid scars form breast reduction.  Hidradenitis.  HTN. Pituitary adenoma.  On Dostinex.  Elevated HgbA1C.  On Mounjaro.  FH CVA.  PHQ-9: 0 On Monjauro.  PLAN: Mammogram screening discussed. Self breast awareness reviewed. Pap and HRV collected:  no.  Due in 2026.  Guidelines for Calcium, Vitamin D, regular exercise program including cardiovascular and weight bearing exercise. Medication refills:  Micronor.  We discussed uterine artery embolization, Depo Lupron, and Myfembree, Depo Provera.   She is concerned about interaction with other medications.   Up to Date information provided about uterine artery embolization.  We discussed the possibility for a second opinion  regarding uterine artery embolization.  Return for pelvic ultrasound.  Follow up:  for yearly exam also.

## 2024-02-28 ENCOUNTER — Encounter: Payer: Self-pay | Admitting: Nurse Practitioner

## 2024-02-28 NOTE — Assessment & Plan Note (Signed)
 Will recheck prolactin level

## 2024-02-28 NOTE — Assessment & Plan Note (Signed)
 Blood pressure is well controlled, continue current medications.

## 2024-02-28 NOTE — Assessment & Plan Note (Signed)
 Declines shingrix, educated on disease process and is aware if he changes his mind to notify office

## 2024-02-28 NOTE — Assessment & Plan Note (Signed)
 Hgba1c was slightly elevated at last visit, continue current medications.

## 2024-02-28 NOTE — Assessment & Plan Note (Signed)

## 2024-02-28 NOTE — Assessment & Plan Note (Signed)
 These are occurring between her breast and bilateral axilla. Will treat with low dose doxycycline to hopefully avoid having frequent exacerbations

## 2024-02-28 NOTE — Assessment & Plan Note (Signed)
 She is encouraged to strive for BMI less than 30 to decrease cardiac risk. Advised to aim for at least 150 minutes of exercise per week.

## 2024-03-02 ENCOUNTER — Telehealth: Payer: Self-pay

## 2024-03-02 NOTE — Telephone Encounter (Signed)
PA for [facility] sent to plan.

## 2024-03-03 ENCOUNTER — Encounter: Payer: Self-pay | Admitting: Obstetrics and Gynecology

## 2024-03-03 ENCOUNTER — Ambulatory Visit (INDEPENDENT_AMBULATORY_CARE_PROVIDER_SITE_OTHER): Payer: Managed Care, Other (non HMO) | Admitting: Obstetrics and Gynecology

## 2024-03-03 VITALS — BP 128/76 | HR 88 | Ht 64.5 in | Wt 175.0 lb

## 2024-03-03 DIAGNOSIS — Z01419 Encounter for gynecological examination (general) (routine) without abnormal findings: Secondary | ICD-10-CM

## 2024-03-03 DIAGNOSIS — D219 Benign neoplasm of connective and other soft tissue, unspecified: Secondary | ICD-10-CM

## 2024-03-03 DIAGNOSIS — Z1331 Encounter for screening for depression: Secondary | ICD-10-CM

## 2024-03-03 MED ORDER — NORETHINDRONE 0.35 MG PO TABS: 1.0000 | ORAL_TABLET | Freq: Every day | ORAL | 3 refills | Status: DC

## 2024-03-03 NOTE — Patient Instructions (Addendum)
 Uterine Artery Embolization for Fibroids Uterine fibroids are abnormal growths that can grow in the uterus. These growths are not cancer. But they cause heavy bleeding and pain during a period. Fibroids can vary in size, shape, weight, and where they grow in your uterus. Uterine artery embolization is a procedure to shrink the fibroids. In this procedure, a soft tube called a catheter is used to put tiny particles that block the blood supply into the fibroid. This causes the fibroid to shrink. Tell a health care provider about: Any allergies you have. All medicines you're taking. These include vitamins, herbs, eye drops, creams, and over-the-counter medicines. Any problems you or family members have had with anesthesia. Any bleeding problems you have. Any surgeries you've had. Any medical conditions you have. Whether you're pregnant or may be pregnant. What are the risks? Your health care provider will talk with you about risks. These include: Bleeding. Allergies to medicines or dyes. Damage to nearby structures or organs. Infection, including blood infection. Injury to the uterus from lower blood supply. Lack of monthly periods. Other problems may happen, such as: Urinary tract infection or urinary retention. Vessel or nerve injury. A blood clot in the legs or lungs. What happens before the procedure? When to stop eating and drinking Follow what your provider tells you about what you may eat and drink. This may include: 8 hours before the procedure Stop eating most food. Do not eat meat, fried foods, or fatty foods. Eat only light foods, such as toast or crackers. All liquids are okay except energy drinks and alcohol. 6 hours before the procedure Stop eating. Drink only clear liquids, such as water, clear fruit juice, black coffee, plain tea, and sports drinks. Do not drink energy drinks or alcohol. 2 hours before the procedure Stop drinking all liquids. You may be allowed to  take medicines with small sips of water. If you don't do what your provider tells you, your procedure may be delayed or canceled. Medicines Ask your provider about: Changing or stopping your regular medicines. These include any diabetes medicines or blood thinners you take. Taking medicines such as aspirin and ibuprofen. These medicines can thin your blood. Do not take these medicines unless your provider tells you to. Surgery safety Ask your provider what steps will be taken to help prevent infection. These steps may include: Removing hair at the surgery site. Washing skin with a soap that kills germs. Taking antibiotics. General instructions Do not smoke, vape, or use any products that have nicotine or tobacco for at least 4 weeks before the procedure. If you need help to quit, ask your provider. If you'll be going home right after the procedure, plan to have a responsible adult: Take you home from the hospital or clinic. You won't be allowed to drive. Care for you for the time you're told. You'll be asked to pee before the procedure. What happens during the procedure?  You will be given: A sedative to help you relax. Anesthesia to keep you from feeling pain. A small cut (incision) will be made in the artery in your groin or arm. A catheter will be put in the artery and guided to your uterus. Images and X-rays will be taken while dye is put through the catheter. This shows the blood supply to your uterus and fibroids. Tiny particles, about the size of grains of sand, will be put through the catheter. They'll lodge into the arteries in your uterus. This will block the blood supply to the  fibroids and help shrink them. The procedure will be done again on the artery on the other side of your uterus. The catheter will be taken out. A bandage will be placed over the cut that was made in your groin or arm. The procedure may vary among providers and hospitals. What happens after the  procedure? Your blood pressure, heart rate, breathing rate, and blood oxygen level will be monitored until you leave the hospital or clinic. You may need to lie flat and still, while putting pressure on the incision site. You'll be given medicine to treat pain. You may throw up or feel like throwing up. You'll be given medicine for this. This information is not intended to replace advice given to you by your health care provider. Make sure you discuss any questions you have with your health care provider. Document Revised: 04/10/2023 Document Reviewed: 04/10/2023 Elsevier Patient Education  2024 [employer]  EXERCISE AND DIET:  We recommended that you start or continue a regular exercise program for good health. Regular exercise means any activity that makes your heart beat faster and makes you sweat.  We recommend exercising at least 30 minutes per day at least 3 days a week, preferably 4 or 5.  We also recommend a diet low in fat and sugar.  Inactivity, poor dietary choices and obesity can cause diabetes, heart attack, stroke, and kidney damage, among others.    ALCOHOL AND SMOKING:  Women should limit their alcohol intake to no more than 7 drinks/beers/glasses of wine (combined, not each!) per week. Moderation of alcohol intake to this level decreases your risk of breast cancer and liver damage. And of course, no recreational drugs are part of a healthy lifestyle.  And absolutely no smoking or even second hand smoke. Most people know smoking can cause heart and lung diseases, but did you know it also contributes to weakening of your bones? Aging of your skin?  Yellowing of your teeth and nails?  CALCIUM AND VITAMIN D:  Adequate intake of calcium and Vitamin D are recommended.  The recommendations for exact amounts of these supplements seem to change often, but generally speaking 600 mg of calcium (either carbonate or citrate) and 800 units of Vitamin D per day seems prudent. Certain women may  benefit from higher intake of Vitamin D.  If you are among these women, your doctor will have told you during your visit.    PAP SMEARS:  Pap smears, to check for cervical cancer or precancers,  have traditionally been done yearly, although recent scientific advances have shown that most women can have pap smears less often.  However, every woman still should have a physical exam from her gynecologist every year. It will include a breast check, inspection of the vulva and vagina to check for abnormal growths or skin changes, a visual exam of the cervix, and then an exam to evaluate the size and shape of the uterus and ovaries.  And after 51 years of age, a rectal exam is indicated to check for rectal cancers. We will also provide age appropriate advice regarding health maintenance, like when you should have certain vaccines, screening for sexually transmitted diseases, bone density testing, colonoscopy, mammograms, etc.   MAMMOGRAMS:  All women over 82 years old should have a yearly mammogram. Many facilities now offer a "3D" mammogram, which may cost around $50 extra out of pocket. If possible,  we recommend you accept the option to have the 3D mammogram performed.  It both reduces  the number of women who will be called back for extra views which then turn out to be normal, and it is better than the routine mammogram at detecting truly abnormal areas.    COLONOSCOPY:  Colonoscopy to screen for colon cancer is recommended for all women at age 39.  We know, you hate the idea of the prep.  We agree, BUT, having colon cancer and not knowing it is worse!!  Colon cancer so often starts as a polyp that can be seen and removed at colonscopy, which can quite literally save your life!  And if your first colonoscopy is normal and you have no family history of colon cancer, most women don't have to have it again for 10 years.  Once every ten years, you can do something that may end up saving your life, right?  We will be  happy to help you get it scheduled when you are ready.  Be sure to check your insurance coverage so you understand how much it will cost.  It may be covered as a preventative service at no cost, but you should check your particular policy.

## 2024-03-05 ENCOUNTER — Ambulatory Visit: Admitting: Neurology

## 2024-03-05 ENCOUNTER — Encounter: Payer: Self-pay | Admitting: Neurology

## 2024-03-05 ENCOUNTER — Ambulatory Visit: Payer: Managed Care, Other (non HMO) | Admitting: Neurology

## 2024-03-05 VITALS — BP 136/90 | HR 82 | Ht 64.0 in | Wt 176.6 lb

## 2024-03-05 DIAGNOSIS — G4733 Obstructive sleep apnea (adult) (pediatric): Secondary | ICD-10-CM | POA: Diagnosis not present

## 2024-03-05 DIAGNOSIS — R7989 Other specified abnormal findings of blood chemistry: Secondary | ICD-10-CM

## 2024-03-05 DIAGNOSIS — D352 Benign neoplasm of pituitary gland: Secondary | ICD-10-CM

## 2024-03-05 NOTE — Progress Notes (Signed)
 Provider:  Melvyn Novas, MD  Primary Care Physician:  [name], FNP [address] [place] [address]     Referring Provider: [name], Fnp [address] [place],  [place] [address]          Chief Complaint according to patient   Patient presents with:                HISTORY OF PRESENT ILLNESS:  Melanie Little is a 53 y.o. female patient who is here for revisit 03/05/2024 for  OSA on CPAP. She presents with high compliance. .  Chief concern according to patient :   I have missed some days of my pergolide and had increased level of Prolactin, now resumed therapy.  Has stayed on CPAP for OSA, compliantly . Residual AHI is less than 1 /h.  She is reporting some migraines, not an increase in frequency or intensity.      03-14-2023:  Melanie Little is a 53 y.o. female patient who is here for revisit 03/14/2023 with CPAP compliance data , a HST confirmed the need for ongoing CPAP therapy and she presented with her new CPAP.  This 53 year old woman with migraines with catamenial  pattern,  headache and had irregular cycles, finally amenorrhea- her gynecologist checked for hormone levels- and found elevated  Prolactin ( only 46 , and normal FSH and LH- she was not menopausal. An MRI  of the brain found a pituitary microadenoma, 8 by 5 mm, as of 11-27-2022.  Chief concern according to patient :  " Will I have to take carbegolide ? Bromocriptine for the rest of my life".   Melanie Little was seen by our neurosurgical colleagues at [facility] neurosurgery and the MRI studies show an 8 x 5 microadenoma it has been unchanged in comparison to the scan from November 04, 2022.  They typically present with values of prolactin well over 150/200 this 1 was just under 100.  The gland also appears to sit below the current calcium at this time.  She has no symptoms except that she had an irregular menses she never had milk flow. Once she was placed on bromocriptine  her menstrual cycle became irregular.  She is also carrying the diagnosis of hypertension diabetes and has sleep apnea.  She reports no visual restriction of the field.  Memory language attention span.  She is not menopausal yet she will be 52 and may she was switched to Nebulized which Dr. Franky Macho prescribed as a low-dose of 1.5 mg twice a week.  And he will be willing to refill the medication also I can take this over if needed.  As long as her menstrual cycle remains regular for the time being I think this is a sure sign the prolactin did interfere with her menstrual regularity.   I also have the pleasure of looking at today's compliance data for CPAP which are excellent.  100% compliance at 6 cm water pressure with 3 cm EPR her residual AHI is 1.2/h.  She does have significant air leakage but she does not have central apneas arising.  8 hours and 18 minutes is her average use at time there is no need for any changes and she actually likes her CPAP.     Review of Systems: Out of a complete 14 system review, the patient complains of only the following symptoms, and all other reviewed systems are negative.:   ESS 3/  24   FSS 9/ 63  Social History   Socioeconomic History   Marital status: Married    Spouse name: Not on file   Number of children: 1   Years of education: Not on file   Highest education level: Master's degree (e.g., MA, MS, MEng, MEd, MSW, MBA)  Occupational History   Occupation: [employer]  Tobacco Use   Smoking status: Never   Smokeless tobacco: Never  Vaping Use   Vaping status: Never Used  Substance and Sexual Activity   Alcohol use: No   Drug use: Never   Sexual activity: Yes    Birth control/protection: Pill    Comment: Micronor  Other Topics Concern   Not on file  Social History Narrative   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[employer]      Home Situation: lives with husband      Spiritual Beliefs: Baptist       Lifestyle: walks every other day; diet is bad      Social Drivers of Corporate investment banker Strain: Low Risk  (02/13/2024)   Overall Financial Resource Strain (CARDIA)    Difficulty of Paying Living Expenses: Not hard at all  Food Insecurity: No Food Insecurity (02/13/2024)   Hunger Vital Sign    Worried About Running Out of Food in the Last Year: Never true    Ran Out of Food in the Last Year: Never true  Transportation Needs: No Transportation Needs (02/13/2024)   PRAPARE - Administrator, Civil Service (Medical): No    Lack of Transportation (Non-Medical): No  Physical Activity: Unknown (02/13/2024)   Exercise Vital Sign    Days of Exercise per Week: 0 days    Minutes of Exercise per Session: Not on file  Stress: No Stress Concern Present (02/13/2024)   [employer] of Occupational Health - Occupational Stress Questionnaire    Feeling of Stress : Not at all  Social Connections: Moderately Integrated (02/13/2024)   Social Connection and Isolation Panel [NHANES]    Frequency of Communication with Friends and Family: Twice a week    Frequency of Social Gatherings with Friends and Family: Once a week    Attends Religious Services: More than 4 times per year    Active Member of [employer] or Organizations: No    Attends Engineer, structural: Not on file    Marital Status: Married    Family History  Problem Relation Age of Onset   CVA Mother 82       deceased, was 27 at the age of her first stroke   Stroke Mother    Diabetes Father    Deep vein thrombosis Father    Breast cancer Maternal Aunt    Breast cancer Maternal Aunt    Sleep apnea Neg Hx     Past Medical History:  Diagnosis Date   Abnormal EKG    Abnormal Pap smear of cervix about 2013, 2016   Colpo biopsy with normal result per pt.  colpo 2016 HPV changes   Diabetes mellitus without complication (HCC)    DUB (dysfunctional uterine bleeding)    around 2012, saw gyn at the time for eval    Elevated hemoglobin A1c    Fibroid    multiple fibroids   Hypertension    Left ovarian cyst 05/2018   do yearly ultrasound.  Doing GYN ONC consulation for monitoring.    Migraine    OSA on CPAP  Pituitary adenoma (HCC) 02/09/2023   MIR - 8 x 5 mm   Resistant hypertension 05/02/2021    Past Surgical History:  Procedure Laterality Date   BREAST REDUCTION SURGERY  2005   CHOLECYSTECTOMY  2004   EYE SURGERY     INCISION AND DRAINAGE Bilateral 2015   Bilateral axillary region ebaceous cyst   IR RADIOLOGIST EVAL & MGMT  09/14/2022   IR RADIOLOGIST EVAL & MGMT  11/08/2022   MASS EXCISION N/A 12/02/2020   Procedure: EXCISION OF TWO MIDLINE BACK SUBCUTANEOUS MASSES;  Surgeon: Quentin Ore, MD;  Location: [facility];  Service: General;  Laterality: N/A;   REDUCTION MAMMAPLASTY Bilateral 2005     Current Outpatient Medications on File Prior to Visit  Medication Sig Dispense Refill   amLODipine (NORVASC) 5 MG tablet TAKE 1 TABLET(5 MG) BY MOUTH DAILY 90 tablet 3   amoxicillin-clavulanate (AUGMENTIN) 875-125 MG tablet Take 1 tablet by mouth 2 (two) times daily. 14 tablet 0   Blood Glucose Monitoring Suppl (ONE TOUCH ULTRA MINI) w/Device KIT 1 each by Does not apply route 3 (three) times daily as needed. 1 kit 1   cabergoline (DOSTINEX) 0.5 MG tablet TAKE 1/2 TABLET BY MOUTH 2 TIMES A WEEK 8 tablet 0   doxycycline (VIBRAMYCIN) 50 MG capsule Take 1 capsule (50 mg total) by mouth daily. 90 capsule 1   EPINEPHrine 0.3 mg/0.3 mL IJ SOAJ injection Inject 0.3 mLs into the muscle as needed. For Anaphylaxis     glucose blood test strip Use as instructed 100 each 12   hydrALAZINE (APRESOLINE) 25 MG tablet TAKE 1 TABLET(25 MG) BY MOUTH TWICE DAILY 180 tablet 3   Lancets (ONETOUCH ULTRASOFT) lancets Use as instructed 100 each 12   nebivolol (BYSTOLIC) 10 MG tablet TAKE 1 WUJWJX(91 MG) BY MOUTH DAILY 30 tablet 5   norethindrone (MICRONOR) 0.35 MG tablet Take 1 tablet (0.35 mg  total) by mouth daily. 84 tablet 3   spironolactone (ALDACTONE) 25 MG tablet Take 1 tablet (25 mg total) by mouth daily. 90 tablet 3   tirzepatide (MOUNJARO) 12.5 MG/0.5ML Pen Inject 12.5 mg into the skin once a week. 6 mL 1   valsartan (DIOVAN) 160 MG tablet Take 1 tablet (160 mg total) by mouth daily. 90 tablet 3   No current facility-administered medications on file prior to visit.    Allergies  Allergen Reactions   Sulfamethoxazole-Trimethoprim Other (See Comments), Hives, Itching and Swelling     DIAGNOSTIC DATA (LABS, IMAGING, TESTING) - I reviewed patient records, labs, notes, testing and imaging myself where available.  Lab Results  Component Value Date   WBC 9.1 10/15/2023   HGB 12.3 10/15/2023   HCT 38.5 10/15/2023   MCV 71 (L) 10/15/2023   PLT 418 10/15/2023      Component Value Date/Time   NA 137 02/17/2024 1709   K 4.2 02/17/2024 1709   CL 102 02/17/2024 1709   CO2 23 02/17/2024 1709   GLUCOSE 82 02/17/2024 1709   GLUCOSE 129 (H) 11/30/2020 0945   BUN 6 02/17/2024 1709   CREATININE 0.80 02/17/2024 1709   CREATININE 0.70 11/09/2020 1514   CALCIUM 9.1 02/17/2024 1709   PROT 7.5 10/15/2023 1619   ALBUMIN 4.6 10/15/2023 1619   AST 14 10/15/2023 1619   ALT 10 10/15/2023 1619   ALKPHOS 69 10/15/2023 1619   BILITOT 1.0 10/15/2023 1619   GFRNONAA >60 11/30/2020 0945   GFRNONAA >89 04/30/2017 1529   GFRAA >60 02/25/2019 1420  GFRAA >89 04/30/2017 1529   Lab Results  Component Value Date   CHOL 175 10/15/2023   HDL 51 10/15/2023   LDLCALC 96 10/15/2023   TRIG 160 (H) 10/15/2023   CHOLHDL 3.4 10/15/2023   Lab Results  Component Value Date   HGBA1C 5.8 (H) 02/17/2024   Lab Results  Component Value Date   VITAMINB12 808 10/15/2023   Lab Results  Component Value Date   TSH 4.030 10/09/2022    PHYSICAL EXAM:  Today's Vitals   03/05/24 1534  BP: (!) 136/90  Pulse: 82  Weight: 176 lb 9.6 oz (80.1 kg)  Height: 5\' 4"  (1.626 m)   Body mass index  is 30.31 kg/m.   Wt Readings from Last 3 Encounters:  03/05/24 176 lb 9.6 oz (80.1 kg)  03/03/24 175 lb (79.4 kg)  02/17/24 179 lb 6.4 oz (81.4 kg)     Ht Readings from Last 3 Encounters:  03/05/24 5\' 4"  (1.626 m)  03/03/24 5' 4.5" (1.638 m)  02/17/24 5\' 2"  (1.575 m)      General: The patient is awake, alert and appears not in acute distress. The patient is well groomed. Head: Normocephalic, atraumatic. The patient is awake, alert and appears not in acute distress. The patient is well groomed. Head: Normocephalic, atraumatic.  Neck is supple. Mallampati 3,  neck circumference:14.5  inches.  Nasal airflow  patent.  Cardiovascular:  Regular rate and cardiac rhythm by pulse,  without distended neck veins. Respiratory: Lungs are clear to auscultation.  Skin:  Without evidence of ankle edema, or rash. Trunk: The patient's posture is erect.     Generalized: Well developed, in no acute distress  Chest: Lungs clear to auscultation bilaterally   Neurological examination  No changes in a taste or smell. She is congested today.  CN : Peripheral visual field by finger perimetry intact.  Bilateral ptosis.  Equal pupil size and reactive to light.   Mentation: Alert oriented to time, place, history taking. Follows all commands speech and language fluent  Head turning and shoulder shrug  were normal and symmetric. Gait and station: Gait is normal.  Coordination: Rapid alternating movements in the fingers/hands were of normal speed.  The Finger-to-nose maneuver was intact without evidence of ataxia, dysmetria or tremor.  Deep tendon reflexes: in the upper and lower extremities are symmetric and intact.     ASSESSMENT AND PLAN 53 y.o. year old female  here with:    1) controlled OSA on CPAP-  excellent response in the treatment of severe sleep apnea.    2) microadenoma I,prolactinoma-  pituitary,  prolactin levels have been coming up again.   Component Ref Range & Units (hover) 2  wk ago (02/17/24) 8 mo ago (06/19/23) 1 yr ago (02/14/23) 1 yr ago (12/05/22) 1 yr ago (10/09/22)  Prolactin 55.3 High  12.2 49.6 High  19.9 R, CM 46.0 High  R  Resulting Agency [facility]        Continues med. No surgery.  MRI repeat .in 2026, brain.    3)  CPAP is 73.51 years old. She is on an S 10.   I plan to follow up either personally or through our NP within 12 months.   I would like to thank  [name], Fnp [address] [place],  [place] [address] for allowing me to meet with and to take care of this pleasant patient.     After spending a total time of  25  minutes face to face and additional time for physical and neurologic examination, review of laboratory studies,  personal review of imaging studies, reports and results of other testing and review of referral information / records as far as provided in visit,   Electronically signed by: Melvyn Novas, MD 03/05/2024 3:55 PM  [facility] and [facility] Sleep Board certified by The [employer] of Sleep Medicine and Diplomate of the [employer] of Sleep Medicine. Board certified In Neurology through the ABPN, Fellow of the [employer] of Neurology.

## 2024-03-05 NOTE — Progress Notes (Signed)
 Marland Kitchen

## 2024-03-06 ENCOUNTER — Encounter (HOSPITAL_BASED_OUTPATIENT_CLINIC_OR_DEPARTMENT_OTHER): Payer: Self-pay

## 2024-03-09 ENCOUNTER — Ambulatory Visit (HOSPITAL_BASED_OUTPATIENT_CLINIC_OR_DEPARTMENT_OTHER): Payer: Managed Care, Other (non HMO) | Admitting: Cardiovascular Disease

## 2024-03-09 VITALS — BP 126/77 | HR 78 | Ht 64.0 in | Wt 175.4 lb

## 2024-03-09 DIAGNOSIS — I1A0 Resistant hypertension: Secondary | ICD-10-CM | POA: Diagnosis not present

## 2024-03-09 DIAGNOSIS — G4733 Obstructive sleep apnea (adult) (pediatric): Secondary | ICD-10-CM | POA: Diagnosis not present
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
## 2024-03-09 NOTE — Patient Instructions (Signed)
 Medication Instructions:  Your physician has recommended you make the following change in your medication:   Hold Hydralazine and report blood pressures in 1-2 weeks via mychart.   Follow-Up: 6 months in ADV HTN CLINIC with Dr. Duke Salvia, Gillian Shields, NP or Phillips Hay

## 2024-03-11 ENCOUNTER — Telehealth: Payer: Self-pay

## 2024-03-11 NOTE — Telephone Encounter (Signed)
 Mounjaro approved 02/04/2024-03/10/2025.  Request ID [id number]

## 2024-03-23 ENCOUNTER — Encounter (HOSPITAL_BASED_OUTPATIENT_CLINIC_OR_DEPARTMENT_OTHER): Payer: Self-pay

## 2024-03-24 NOTE — Telephone Encounter (Signed)
[facility] log as requested 

## 2024-04-02 ENCOUNTER — Encounter (HOSPITAL_BASED_OUTPATIENT_CLINIC_OR_DEPARTMENT_OTHER): Payer: Self-pay | Admitting: Cardiovascular Disease

## 2024-04-06 ENCOUNTER — Other Ambulatory Visit (HOSPITAL_BASED_OUTPATIENT_CLINIC_OR_DEPARTMENT_OTHER): Payer: Self-pay | Admitting: Cardiovascular Disease

## 2024-04-14 ENCOUNTER — Other Ambulatory Visit: Admitting: Obstetrics and Gynecology

## 2024-04-14 ENCOUNTER — Other Ambulatory Visit

## 2024-04-16 NOTE — Progress Notes (Signed)
 GYNECOLOGY  VISIT   HPI: 53 y.o.   Married  [place] female   [phone] with Patient's last menstrual period was 04/08/2024 (exact date).   here for: GYN scan due to fibroids. Would like to ask about getting lab work done, specifically prolactin levels.  She wants to check her prolactin level. Her neurosurgeon requested this today.  She was unable to go to [facility] prior to her visit here.   She takes Dostinex .  She has pain on her left side and left back.  Denies intermenstrual bleeding.   GYNECOLOGIC HISTORY: Patient's last menstrual period was 04/08/2024 (exact date). Contraception: POPs Menopausal hormone therapy: n/a Last 2 paps: 2020-WNL, HPV- neg, 10/09/2022-WNL History of abnormal Pap or positive HPV:  no Mammogram: 06/07/2023-WNL, Cat B        OB History     Gravida  4   Para  1   Term  1   Preterm  0   AB  3   Living  1      SAB  0   IAB  3   Ectopic  0   Multiple  0   Live Births  1              Patient Active Problem List   Diagnosis Date Noted   Herpes zoster vaccination declined 02/17/2024   Encounter for annual health examination 10/27/2023   Influenza vaccination declined 10/27/2023   Need for zoster vaccination 10/27/2023   COVID-19 vaccination declined 10/27/2023   Boil 10/27/2023   Decreased energy 10/27/2023   Chronic left-sided low back pain without sciatica 06/17/2023   OSA on CPAP 03/14/2023   Primary hypertension 02/26/2023   Pure hypercholesterolemia 02/26/2023   Prolactin increased 02/26/2023   Pituitary microadenoma (HCC) 11/20/2022   Intractable chronic migraine without aura and with status migrainosus 11/20/2022   Amenorrhea 11/20/2022   Encounter for birth control pills maintenance 05/02/2021   Resistant hypertension 05/02/2021   LVH (left ventricular hypertrophy) 01/09/2021   Nonspecific abnormal electrocardiogram (ECG) (EKG) 12/16/2020   Headache 03/12/2018   Numbness and tingling of left upper  extremity 03/12/2018   Elevated CA-125 10/21/2017   Recurrent boils 07/30/2017   OSA (obstructive sleep apnea) 04/30/2017   Chronic hypokalemia 04/30/2017   Obese 04/30/2017   Fibroid, uterine 08/19/2016   History of abnormal cervical Pap smear 08/19/2016   Snoring 10/13/2015   Hypersomnia with sleep apnea 10/13/2015   Menorrhagia with regular cycle 09/19/2015   Left ovarian cyst 09/19/2015   Type 2 diabetes mellitus with obesity (HCC) 06/08/2015    Past Medical History:  Diagnosis Date   Abnormal EKG    Abnormal Pap smear of cervix about 2013, 2016   Colpo biopsy with normal result per pt.  colpo 2016 HPV changes   Diabetes mellitus without complication (HCC)    DUB (dysfunctional uterine bleeding)    around 2012, saw gyn at the time for eval   Elevated hemoglobin A1c    Fibroid    multiple fibroids   Hypertension    Left ovarian cyst 05/2018   do yearly ultrasound.  Doing GYN ONC consulation for monitoring.    Migraine    OSA on CPAP    Pituitary adenoma (HCC) 02/09/2023   MIR - 8 x 5 mm   Pure hypercholesterolemia 02/26/2023   Resistant hypertension 05/02/2021    Past Surgical History:  Procedure Laterality Date   BREAST REDUCTION SURGERY  2005   CHOLECYSTECTOMY  2004   EYE SURGERY  INCISION AND DRAINAGE Bilateral 2015   Bilateral axillary region ebaceous cyst   IR RADIOLOGIST EVAL & MGMT  09/14/2022   IR RADIOLOGIST EVAL & MGMT  11/08/2022   MASS EXCISION N/A 12/02/2020   Procedure: EXCISION OF TWO MIDLINE BACK SUBCUTANEOUS MASSES;  Surgeon: Junie Olds, MD;  Location: [facility];  Service: General;  Laterality: N/A;   REDUCTION MAMMAPLASTY Bilateral 2005    Current Outpatient Medications  Medication Sig Dispense Refill   amLODipine  (NORVASC ) 5 MG tablet TAKE 1 TABLET(5 MG) BY MOUTH DAILY 90 tablet 3   amoxicillin -clavulanate (AUGMENTIN ) 875-125 MG tablet Take 1 tablet by mouth 2 (two) times daily. 14 tablet 0   Blood Glucose  Monitoring Suppl (ONE TOUCH ULTRA MINI) w/Device KIT 1 each by Does not apply route 3 (three) times daily as needed. 1 kit 1   cabergoline  (DOSTINEX ) 0.5 MG tablet TAKE 1/2 TABLET BY MOUTH 2 TIMES A WEEK 8 tablet 0   doxycycline  (VIBRAMYCIN ) 50 MG capsule Take 1 capsule (50 mg total) by mouth daily. 90 capsule 1   EPINEPHrine 0.3 mg/0.3 mL IJ SOAJ injection Inject 0.3 mLs into the muscle as needed. For Anaphylaxis     glucose blood test strip Use as instructed 100 each 12   Lancets (ONETOUCH ULTRASOFT) lancets Use as instructed 100 each 12   nebivolol  (BYSTOLIC ) 10 MG tablet TAKE 1 TABLET(10 MG) BY MOUTH DAILY 30 tablet 5   norethindrone  (MICRONOR ) 0.35 MG tablet Take 1 tablet (0.35 mg total) by mouth daily. 84 tablet 3   spironolactone  (ALDACTONE ) 25 MG tablet Take 1 tablet (25 mg total) by mouth daily. 90 tablet 3   tirzepatide  (MOUNJARO ) 12.5 MG/0.5ML Pen Inject 12.5 mg into the skin once a week. 6 mL 1   valsartan  (DIOVAN ) 160 MG tablet Take 1 tablet (160 mg total) by mouth daily. 90 tablet 3   No current facility-administered medications for this visit.     ALLERGIES: Sulfamethoxazole -trimethoprim  and Sulfa  antibiotics  Family History  Problem Relation Age of Onset   CVA Mother 8       deceased, was 103 at the age of her first stroke   Stroke Mother    Diabetes Father    Deep vein thrombosis Father    Breast cancer Maternal Aunt    Breast cancer Maternal Aunt    Sleep apnea Neg Hx     Social History   Socioeconomic History   Marital status: Married    Spouse name: Not on file   Number of children: 1   Years of education: Not on file   Highest education level: Master's degree (e.g., MA, MS, MEng, MEd, MSW, MBA)  Occupational History   Occupation: [employer]  Tobacco Use   Smoking status: Never   Smokeless tobacco: Never  Vaping Use   Vaping status: Never Used  Substance and Sexual Activity   Alcohol use: No   Drug use: Never   Sexual activity: Yes     Birth control/protection: Pill    Comment: Micronor   Other Topics Concern   Not on file  Social History Narrative   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[employer]      Home Situation: lives with husband      Spiritual Beliefs: Baptist      Lifestyle: walks every other day; diet is bad      Social Drivers of Corporate investment banker Strain: Low Risk  (02/13/2024)   Overall  Financial Resource Strain (CARDIA)    Difficulty of Paying Living Expenses: Not hard at all  Food Insecurity: No Food Insecurity (02/13/2024)   Hunger Vital Sign    Worried About Running Out of Food in the Last Year: Never true    Ran Out of Food in the Last Year: Never true  Transportation Needs: No Transportation Needs (02/13/2024)   PRAPARE - Administrator, Civil Service (Medical): No    Lack of Transportation (Non-Medical): No  Physical Activity: Unknown (02/13/2024)   Exercise Vital Sign    Days of Exercise per Week: 0 days    Minutes of Exercise per Session: Not on file  Stress: No Stress Concern Present (02/13/2024)   [employer] of Occupational Health - Occupational Stress Questionnaire    Feeling of Stress : Not at all  Social Connections: Moderately Integrated (02/13/2024)   Social Connection and Isolation Panel [NHANES]    Frequency of Communication with Friends and Family: Twice a week    Frequency of Social Gatherings with Friends and Family: Once a week    Attends Religious Services: More than 4 times per year    Active Member of [employer] or Organizations: No    Attends Engineer, structural: Not on file    Marital Status: Married  Catering manager Violence: Not on file    Review of Systems  See HPI.   PHYSICAL EXAMINATION:   BP 124/86 (BP Location: Right Arm, Patient Position: Sitting)   Pulse 74   Ht 5' 4.25" (1.632 m)   Wt 175 lb (79.4 kg)   LMP 04/08/2024 (Exact Date)   SpO2 99%   BMI 29.81 kg/m     General appearance:  alert, cooperative and appears stated age   Pelvic US  Uterus 16.26. 11.08 x 11.60 cm.  Fibroids:  intramural and subserosal:  5.58 cm, 4.18 cm, 4.47 cm, 2.47 cm, 2.80 cm, 6.36 cm,1.54 cm. EMS 7 mm. Symmetrical.  Left ovary 2.23 cm. x 2.16 cm x 2.29 cm.   Normal.  Right ovary 6.44 x 5.69 x 5.45 cm.  Right ovarian cyst solid, avascular 41 x 42 mm.  Possible endometrioma. No adnexal masses.  No free fluid.   ASSESSMENT:  Fibroids.  Right ovarian cyst, possible endometrioma. Microadenoma of pituitary gland.  On Dostinex .  On Micronor .  DM.  HTN.  Hx keloids.   PLAN:  Pelvic US  images and report reviewed.  Tx options discussed for fibroids:  Myfembree , Depo Lupron , hysterectomy, myomectomy, uterine artery embolization.  Hysterectomy is a comprehensive treatment for fibroids and right ovarian cystectomy or oophorectomy can be concurrently performed.  Patient would like to have surgical consultation.  Will refer to Dr. Tia Flowers.  She will check her prolactin level with another lab as our office lab is closed for the day.   30 min  total time was spent for this patient encounter, including preparation, face-to-face counseling with the patient, coordination of care, and documentation of the encounter.

## 2024-04-21 ENCOUNTER — Ambulatory Visit (INDEPENDENT_AMBULATORY_CARE_PROVIDER_SITE_OTHER)

## 2024-04-21 ENCOUNTER — Encounter: Payer: Self-pay | Admitting: Obstetrics and Gynecology

## 2024-04-21 ENCOUNTER — Ambulatory Visit: Admitting: Obstetrics and Gynecology

## 2024-04-21 ENCOUNTER — Other Ambulatory Visit: Payer: Self-pay | Admitting: Obstetrics and Gynecology

## 2024-04-21 VITALS — BP 124/86 | HR 74 | Ht 64.25 in | Wt 175.0 lb

## 2024-04-21 DIAGNOSIS — D219 Benign neoplasm of connective and other soft tissue, unspecified: Secondary | ICD-10-CM

## 2024-04-21 DIAGNOSIS — N83201 Unspecified ovarian cyst, right side: Secondary | ICD-10-CM

## 2024-04-21 DIAGNOSIS — E221 Hyperprolactinemia: Secondary | ICD-10-CM

## 2024-04-21 DIAGNOSIS — Z01419 Encounter for gynecological examination (general) (routine) without abnormal findings: Secondary | ICD-10-CM

## 2024-04-21 NOTE — Telephone Encounter (Signed)
 Average BP 1131/75 which is reasonable compared to BP goal <130/80. Continue Amlodipine  5mg  daily. Remain off Hydralazine  (please remove from medication list). Continue to monitor BP at home at least 3 times per week and report if consistently >130/80. Follow up as scheduled.  Maveryck Bahri S Sahaj Bona, NP

## 2024-04-21 NOTE — Telephone Encounter (Signed)
 HI HTN team, is anyone able to review these numbers and advise for pt?

## 2024-05-06 ENCOUNTER — Other Ambulatory Visit: Payer: Self-pay | Admitting: Nurse Practitioner

## 2024-05-06 DIAGNOSIS — Z1231 Encounter for screening mammogram for malignant neoplasm of breast: Secondary | ICD-10-CM

## 2024-05-07 ENCOUNTER — Ambulatory Visit: Admitting: Neurology

## 2024-05-21 ENCOUNTER — Other Ambulatory Visit: Payer: Self-pay | Admitting: Nurse Practitioner

## 2024-05-21 DIAGNOSIS — E1169 Type 2 diabetes mellitus with other specified complication: Secondary | ICD-10-CM

## 2024-05-27 ENCOUNTER — Other Ambulatory Visit (INDEPENDENT_AMBULATORY_CARE_PROVIDER_SITE_OTHER): Payer: Self-pay

## 2024-05-27 ENCOUNTER — Ambulatory Visit: Admitting: Orthopedic Surgery

## 2024-05-27 DIAGNOSIS — G8929 Other chronic pain: Secondary | ICD-10-CM

## 2024-05-27 DIAGNOSIS — M545 Low back pain, unspecified: Secondary | ICD-10-CM | POA: Diagnosis not present

## 2024-05-27 NOTE — Progress Notes (Signed)
 Orthopedic Spine Surgery Office Note   Assessment: Patient is a 53 y.o. female with left-sided low back pain in the area of the SI joint and L5/S1 facets. She did not get any relief with SI injection so possibly coming from L5/S1 facets. Other potential etiology is L5/S1 DDD     Plan: -Patient has tried tylenol , ibuprofen , left SI joint injection -Patient has tried over six months of conservative treatment including tylenol , ibuprofen , and SI joint injections so recommended MRI of the lumbar spine to evaluate further -Patient should return to office in 4 weeks, x-rays at next visit: none     Patient expressed understanding of the plan and all questions were answered to the patient's satisfaction.    ___________________________________________________________________________     History:   Patient is a 53 y.o. female who presents today for follow up on her lumbar spine. Patient continues to have left-sided lower back pain. She describes it as a constant ache. No radiating leg pain. Has not noticed anything making it better or worse. It has not changed since our last office visit. Did a SI joint injection that did not give her any relief.    Treatments tried: tylenol , ibuprofen , left SI joint injection      Physical Exam:   General: no acute distress, appears stated age Neurologic: alert, answering questions appropriately, following commands Respiratory: unlabored breathing on room air, symmetric chest rise Psychiatric: appropriate affect, normal cadence to speech     MSK (spine):   -Strength exam                                                   Left                  Right EHL                              5/5                  5/5 TA                                 5/5                  5/5 GSC                             5/5                  5/5 Knee extension            5/5                  5/5 Hip flexion                    5/5                  5/5   -Sensory exam                            Sensation intact to light touch in L3-S1 nerve distributions of bilateral lower extremities     Imaging: XRs of the  lumbar spine from 05/27/2024 was independently reviewed and interpreted, showing disc height loss at L5/S1. No other significant degenerative changes. No evidence of instability on flexion/extension views. No fracture or dislocation seen.      Patient name: Melanie Little Patient MRN: [id number] Date of visit: 05/27/24

## 2024-06-01 ENCOUNTER — Encounter: Payer: Self-pay | Admitting: Sports Medicine

## 2024-06-01 ENCOUNTER — Encounter: Payer: Self-pay | Admitting: Orthopedic Surgery

## 2024-06-02 ENCOUNTER — Ambulatory Visit: Admitting: Obstetrics and Gynecology

## 2024-06-02 VITALS — BP 122/82 | HR 77 | Ht 64.0 in | Wt 168.0 lb

## 2024-06-02 DIAGNOSIS — N920 Excessive and frequent menstruation with regular cycle: Secondary | ICD-10-CM

## 2024-06-02 DIAGNOSIS — D219 Benign neoplasm of connective and other soft tissue, unspecified: Secondary | ICD-10-CM

## 2024-06-02 DIAGNOSIS — E221 Hyperprolactinemia: Secondary | ICD-10-CM

## 2024-06-02 DIAGNOSIS — N809 Endometriosis, unspecified: Secondary | ICD-10-CM

## 2024-06-02 DIAGNOSIS — N83201 Unspecified ovarian cyst, right side: Secondary | ICD-10-CM

## 2024-06-02 NOTE — Patient Instructions (Signed)
 Since we have time, let's also get cardiac and medical clearance. We will just need a note from each provider. Dr. Tia Flowers

## 2024-06-02 NOTE — Progress Notes (Signed)
 GYNECOLOGY  VISIT   HPI: 53 y.o.   Married  [place] female  Presenting for surgical consult from Dr. Colvin Dec [phone] with Patient's last menstrual period was 06/01/2024.   Patient with heavy bleeding each month and anemia Has large fibroids She has heavy bleeding with clots for 4 days then has vaginal bleeding without clots for another 6 days each month She is using 5ppd in the daytime and 3 overnight pads each night. She is not on iron but has anemia and sickle cell trait She does report fatigue She denies any dysmenorrhea but feels pelvic pressure She has discussed options with Dr. Colvin Dec and would like a definitive treatment with the robotic hysterectomy US  with 16cm uterus and possible right endometrioma  GYNECOLOGIC HISTORY: Patient's last menstrual period was 06/01/2024. Contraception: POPs Menopausal hormone therapy: n/a Last 2 paps: 2020-WNL, HPV- neg, 10/09/2022-WNL History of abnormal Pap or positive HPV:  no Mammogram: 06/07/2023-WNL, Cat B        OB History     Gravida  4   Para  1   Term  1   Preterm  0   AB  3   Living  1      SAB  0   IAB  3   Ectopic  0   Multiple  0   Live Births  1              Patient Active Problem List   Diagnosis Date Noted   Herpes zoster vaccination declined 02/17/2024   Encounter for annual health examination 10/27/2023   Influenza vaccination declined 10/27/2023   Need for zoster vaccination 10/27/2023   COVID-19 vaccination declined 10/27/2023   Boil 10/27/2023   Decreased energy 10/27/2023   Chronic left-sided low back pain without sciatica 06/17/2023   OSA on CPAP 03/14/2023   Primary hypertension 02/26/2023   Pure hypercholesterolemia 02/26/2023   Prolactin increased 02/26/2023   Pituitary microadenoma (HCC) 11/20/2022   Intractable chronic migraine without aura and with status migrainosus 11/20/2022   Amenorrhea 11/20/2022   Encounter for birth control pills maintenance 05/02/2021    Resistant hypertension 05/02/2021   LVH (left ventricular hypertrophy) 01/09/2021   Nonspecific abnormal electrocardiogram (ECG) (EKG) 12/16/2020   Headache 03/12/2018   Numbness and tingling of left upper extremity 03/12/2018   Elevated CA-125 10/21/2017   Recurrent boils 07/30/2017   OSA (obstructive sleep apnea) 04/30/2017   Chronic hypokalemia 04/30/2017   Obese 04/30/2017   Fibroid, uterine 08/19/2016   History of abnormal cervical Pap smear 08/19/2016   Snoring 10/13/2015   Hypersomnia with sleep apnea 10/13/2015   Menorrhagia with regular cycle 09/19/2015   Left ovarian cyst 09/19/2015   Type 2 diabetes mellitus with obesity (HCC) 06/08/2015    Past Medical History:  Diagnosis Date   Abnormal EKG    Abnormal Pap smear of cervix about 2013, 2016   Colpo biopsy with normal result per pt.  colpo 2016 HPV changes   Diabetes mellitus without complication (HCC)    DUB (dysfunctional uterine bleeding)    around 2012, saw gyn at the time for eval   Elevated hemoglobin A1c    Fibroid    multiple fibroids   Hypertension    Left ovarian cyst 05/2018   do yearly ultrasound.  Doing GYN ONC consulation for monitoring.    Migraine    OSA on CPAP    Pituitary adenoma (HCC) 02/09/2023   MIR - 8 x 5 mm   Pure hypercholesterolemia 02/26/2023  Resistant hypertension 05/02/2021    Past Surgical History:  Procedure Laterality Date   BREAST REDUCTION SURGERY  2005   CHOLECYSTECTOMY  2004   EYE SURGERY     INCISION AND DRAINAGE Bilateral 2015   Bilateral axillary region ebaceous cyst   IR RADIOLOGIST EVAL & MGMT  09/14/2022   IR RADIOLOGIST EVAL & MGMT  11/08/2022   MASS EXCISION N/A 12/02/2020   Procedure: EXCISION OF TWO MIDLINE BACK SUBCUTANEOUS MASSES;  Surgeon: Junie Olds, MD;  Location: [facility];  Service: General;  Laterality: N/A;   REDUCTION MAMMAPLASTY Bilateral 2005    Current Outpatient Medications  Medication Sig Dispense Refill    amLODipine  (NORVASC ) 5 MG tablet TAKE 1 TABLET(5 MG) BY MOUTH DAILY 90 tablet 3   Blood Glucose Monitoring Suppl (ONE TOUCH ULTRA MINI) w/Device KIT 1 each by Does not apply route 3 (three) times daily as needed. 1 kit 1   cabergoline  (DOSTINEX ) 0.5 MG tablet TAKE 1/2 TABLET BY MOUTH 2 TIMES A WEEK 8 tablet 0   doxycycline  (VIBRAMYCIN ) 50 MG capsule Take 1 capsule (50 mg total) by mouth daily. 90 capsule 1   EPINEPHrine 0.3 mg/0.3 mL IJ SOAJ injection Inject 0.3 mLs into the muscle as needed. For Anaphylaxis     glucose blood test strip Use as instructed 100 each 12   Lancets (ONETOUCH ULTRASOFT) lancets Use as instructed 100 each 12   MOUNJARO  12.5 MG/0.5ML Pen ADMINISTER 12.5 MG UNDER THE SKIN 1 TIME A WEEK 6 mL 1   nebivolol  (BYSTOLIC ) 10 MG tablet TAKE 1 TABLET(10 MG) BY MOUTH DAILY 30 tablet 5   spironolactone  (ALDACTONE ) 25 MG tablet Take 1 tablet (25 mg total) by mouth daily. 90 tablet 3   valsartan  (DIOVAN ) 160 MG tablet Take 1 tablet (160 mg total) by mouth daily. 90 tablet 3   norethindrone  (MICRONOR ) 0.35 MG tablet Take 1 tablet (0.35 mg total) by mouth daily. (Patient not taking: Reported on 06/02/2024) 84 tablet 3   No current facility-administered medications for this visit.     ALLERGIES: Sulfamethoxazole -trimethoprim  and Sulfa  antibiotics  Family History  Problem Relation Age of Onset   CVA Mother 37       deceased, was 7 at the age of her first stroke   Stroke Mother    Diabetes Father    Deep vein thrombosis Father    Breast cancer Maternal Aunt    Breast cancer Maternal Aunt    Sleep apnea Neg Hx     Social History   Socioeconomic History   Marital status: Married    Spouse name: Not on file   Number of children: 1   Years of education: Not on file   Highest education level: Master's degree (e.g., MA, MS, MEng, MEd, MSW, MBA)  Occupational History   Occupation: [employer]  Tobacco Use   Smoking status: Never   Smokeless tobacco: Never   Vaping Use   Vaping status: Never Used  Substance and Sexual Activity   Alcohol use: No   Drug use: Never   Sexual activity: Yes    Birth control/protection: Pill    Comment: Micronor   Other Topics Concern   Not on file  Social History Narrative   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[employer]      Home Situation: lives with husband      Spiritual Beliefs: Baptist      Lifestyle: walks every other day; diet is bad  Social Drivers of Corporate investment banker Strain: Low Risk  (02/13/2024)   Overall Financial Resource Strain (CARDIA)    Difficulty of Paying Living Expenses: Not hard at all  Food Insecurity: No Food Insecurity (02/13/2024)   Hunger Vital Sign    Worried About Running Out of Food in the Last Year: Never true    Ran Out of Food in the Last Year: Never true  Transportation Needs: No Transportation Needs (02/13/2024)   PRAPARE - Administrator, Civil Service (Medical): No    Lack of Transportation (Non-Medical): No  Physical Activity: Unknown (02/13/2024)   Exercise Vital Sign    Days of Exercise per Week: 0 days    Minutes of Exercise per Session: Not on file  Stress: No Stress Concern Present (02/13/2024)   [employer] of Occupational Health - Occupational Stress Questionnaire    Feeling of Stress : Not at all  Social Connections: Moderately Integrated (02/13/2024)   Social Connection and Isolation Panel [NHANES]    Frequency of Communication with Friends and Family: Twice a week    Frequency of Social Gatherings with Friends and Family: Once a week    Attends Religious Services: More than 4 times per year    Active Member of [employer] or Organizations: No    Attends Engineer, structural: Not on file    Marital Status: Married  Catering manager Violence: Not on file    Review of Systems  See HPI.   PHYSICAL EXAMINATION:   BP 122/82   Pulse 77   Ht 5\' 4"  (1.626 m)   Wt 168 lb (76.2 kg)   LMP  06/01/2024   SpO2 100%   BMI 28.84 kg/m     General appearance: alert, cooperative and appears stated age   Pelvic US  Uterus 16.26. 11.08 x 11.60 cm.  Fibroids:  intramural and subserosal:  5.58 cm, 4.18 cm, 4.47 cm, 2.47 cm, 2.80 cm, 6.36 cm,1.54 cm. EMS 7 mm. Symmetrical.  Left ovary 2.23 cm. x 2.16 cm x 2.29 cm.   Normal.  Right ovary 6.44 x 5.69 x 5.45 cm.  Right ovarian cyst solid, avascular 41 x 42 mm.  Possible endometrioma. No adnexal masses.  No free fluid.   ASSESSMENT:  Fibroids.  Right ovarian cyst, possible endometrioma. Microadenoma of pituitary gland.  On Dostinex .  On Micronor .  DM.  HTN.  Hx keloids.  Menorrhagia  PLAN:  All options reviewed again. Symptomatic fibroid uterus:  Counseled on all options.  She would like to have the [facility]  Counseled extensively on the procedure including but not limited to what to expect and risks and benefits.  Counseled on postop care and pelvic rest for 10 weeks after the surgery with restricted lifting for 6 weeks after.  Counseled on the benefits of the robotic procedure with faster return to daily activities, improved outcomes, and less risk for complications. She would like to have this scheduled. Discussed and counseled on possible olympus bag and morcellation and how this is performed. She agreed. RTC for EMB. To get cardiac and medical clearance Patient would like surgery in December with the holidays 30 minutes spent on reviewing records, imaging,  and one on one patient time and counseling patient and documentation Dr. Tia Flowers

## 2024-06-06 ENCOUNTER — Inpatient Hospital Stay: Admission: RE | Admit: 2024-06-06 | Source: Ambulatory Visit

## 2024-06-07 ENCOUNTER — Other Ambulatory Visit (HOSPITAL_BASED_OUTPATIENT_CLINIC_OR_DEPARTMENT_OTHER): Payer: Self-pay | Admitting: Cardiovascular Disease

## 2024-06-09 ENCOUNTER — Other Ambulatory Visit (HOSPITAL_BASED_OUTPATIENT_CLINIC_OR_DEPARTMENT_OTHER): Payer: Self-pay | Admitting: Cardiovascular Disease

## 2024-06-09 ENCOUNTER — Ambulatory Visit
Admission: RE | Admit: 2024-06-09 | Discharge: 2024-06-09 | Disposition: A | Discharge status: Home / Self Care | Source: Ambulatory Visit | Attending: Nurse Practitioner | Admitting: Nurse Practitioner

## 2024-06-09 DIAGNOSIS — Z1231 Encounter for screening mammogram for malignant neoplasm of breast: Secondary | ICD-10-CM

## 2024-06-09 DIAGNOSIS — I10 Essential (primary) hypertension: Secondary | ICD-10-CM

## 2024-06-10 ENCOUNTER — Encounter (HOSPITAL_BASED_OUTPATIENT_CLINIC_OR_DEPARTMENT_OTHER): Payer: Self-pay | Admitting: Cardiovascular Disease

## 2024-06-10 ENCOUNTER — Ambulatory Visit
Admission: RE | Admit: 2024-06-10 | Discharge: 2024-06-10 | Disposition: A | Discharge status: Home / Self Care | Source: Ambulatory Visit | Attending: Orthopedic Surgery | Admitting: Orthopedic Surgery

## 2024-06-10 DIAGNOSIS — M545 Low back pain, unspecified: Secondary | ICD-10-CM

## 2024-06-10 DIAGNOSIS — I10 Essential (primary) hypertension: Secondary | ICD-10-CM

## 2024-06-10 MED ORDER — NEBIVOLOL HCL 10 MG PO TABS: 10.0000 mg | ORAL_TABLET | Freq: Every day | ORAL | 2 refills | Status: AC

## 2024-06-11 ENCOUNTER — Telehealth: Payer: Self-pay | Admitting: *Deleted

## 2024-06-11 ENCOUNTER — Other Ambulatory Visit (HOSPITAL_BASED_OUTPATIENT_CLINIC_OR_DEPARTMENT_OTHER): Payer: Self-pay | Admitting: Cardiovascular Disease

## 2024-06-11 ENCOUNTER — Encounter: Payer: Self-pay | Admitting: *Deleted

## 2024-06-11 NOTE — Telephone Encounter (Signed)
 Left message for pt to call back and schedule tele preop appt.

## 2024-06-11 NOTE — Telephone Encounter (Signed)
 Primary Cardiologist:None   Preoperative team, please contact this patient and set up a phone call appointment for further preoperative risk assessment. Please obtain consent and complete medication review. Thank you for your help.   I confirm that guidance regarding antiplatelet and oral anticoagulation therapy has been completed and, if necessary, noted below (none requested).  I also confirmed the patient resides in the state of [place] . As per [facility] Medical Board telemedicine laws, the patient must reside in the state in which the provider is licensed.   Gerldine Koch, NP-C  06/11/2024, 2:56 PM [address], [address] [place], [place] [address] Office [phone] Fax [phone]

## 2024-06-11 NOTE — Telephone Encounter (Signed)
   Pre-operative Risk Assessment    Patient Name: Melanie Little  DOB: October 26, 1971 MRN: [id number]   Date of last office visit: 03/09/24 DR. [place]bs Date of next office visit: NONE   Request for Surgical Clearance    Procedure:  RLH, B/L SALPINGECTOMY, POSSIBLE RIGHT OOPHORECTOMY vs CYSTECTOMY FOR ENDOMETRIA, B/L SALPINGECTOMY, POSSIBLE OLYMPUS BAG WITH MORCELLATION   Date of Surgery:  Clearance TBD                                Surgeon:  DR. Baxter Bott Surgeon's Group or Practice Name:  [facility] Phone number:  [phone] Fax number:  [phone] JILL HAMM, RN   Type of Clearance Requested:   - Medical ; NONE INDICATED ON FORM TO BE HELD   Type of Anesthesia:  General    Additional requests/questions:    [name]   06/11/2024, 12:19 PM

## 2024-06-12 ENCOUNTER — Telehealth: Payer: Self-pay | Admitting: *Deleted

## 2024-06-12 NOTE — Telephone Encounter (Signed)
 Pt has been scheduled tele preop appt 06/23/24. Med rec and consent are done.      Patient Consent for Virtual Visit        Melanie Little has provided verbal consent on 06/12/2024 for a virtual visit (video or telephone).   CONSENT FOR VIRTUAL VISIT FOR:  Melanie Little  By participating in this virtual visit I agree to the following:  I hereby voluntarily request, consent and authorize [facility] and its employed or contracted physicians, physician assistants, nurse practitioners or other licensed health care professionals (the Practitioner), to provide me with telemedicine health care services (the "Services) as deemed necessary by the treating Practitioner. I acknowledge and consent to receive the Services by the Practitioner via telemedicine. I understand that the telemedicine visit will involve communicating with the Practitioner through live audiovisual communication technology and the disclosure of certain medical information by electronic transmission. I acknowledge that I have been given the opportunity to request an in-person assessment or other available alternative prior to the telemedicine visit and am voluntarily participating in the telemedicine visit.  I understand that I have the right to withhold or withdraw my consent to the use of telemedicine in the course of my care at any time, without affecting my right to future care or treatment, and that the Practitioner or I may terminate the telemedicine visit at any time. I understand that I have the right to inspect all information obtained and/or recorded in the course of the telemedicine visit and may receive copies of available information for a reasonable fee.  I understand that some of the potential risks of receiving the Services via telemedicine include:  Delay or interruption in medical evaluation due to technological equipment failure or disruption; Information transmitted may not be sufficient (e.g. poor  resolution of images) to allow for appropriate medical decision making by the Practitioner; and/or  In rare instances, security protocols could fail, causing a breach of personal health information.  Furthermore, I acknowledge that it is my responsibility to provide information about my medical history, conditions and care that is complete and accurate to the best of my ability. I acknowledge that Practitioner's advice, recommendations, and/or decision may be based on factors not within their control, such as incomplete or inaccurate data provided by me or distortions of diagnostic images or specimens that may result from electronic transmissions. I understand that the practice of medicine is not an exact science and that Practitioner makes no warranties or guarantees regarding treatment outcomes. I acknowledge that a copy of this consent can be made available to me via my patient portal [facility] MyChart), or I can request a printed copy by calling the office of [facility].    I understand that my insurance will be billed for this visit.   I have read or had this consent read to me. I understand the contents of this consent, which adequately explains the benefits and risks of the Services being provided via telemedicine.  I have been provided ample opportunity to ask questions regarding this consent and the Services and have had my questions answered to my satisfaction. I give my informed consent for the services to be provided through the use of telemedicine in my medical care

## 2024-06-12 NOTE — Telephone Encounter (Signed)
 Patient is returning call.

## 2024-06-12 NOTE — Telephone Encounter (Signed)
 Pt has been scheduled tele preop appt 06/23/24. Med rec and consent are done.

## 2024-06-15 NOTE — Progress Notes (Unsigned)
 [place] Favia, CMA,acting as a Neurosurgeon for Susanna Epley, FNP.,have documented all relevant documentation on the behalf of Susanna Epley, FNP,as directed by  Susanna Epley, FNP while in the presence of Susanna Epley, FNP.  Subjective:  Patient ID: Melanie Little , female    DOB: [birth year] , 53 y.o.   MRN: [id number]  No chief complaint on file.   HPI  HPI   Past Medical History:  Diagnosis Date   Abnormal EKG    Abnormal Pap smear of cervix about 2013, 2016   Colpo biopsy with normal result per pt.  colpo 2016 HPV changes   Diabetes mellitus without complication (HCC)    DUB (dysfunctional uterine bleeding)    around 2012, saw gyn at the time for eval   Elevated hemoglobin A1c    Fibroid    multiple fibroids   Hypertension    Left ovarian cyst 05/2018   do yearly ultrasound.  Doing GYN ONC consulation for monitoring.    Migraine    OSA on CPAP    Pituitary adenoma (HCC) 02/09/2023   MIR - 8 x 5 mm   Pure hypercholesterolemia 02/26/2023   Resistant hypertension 05/02/2021     Family History  Problem Relation Age of Onset   CVA Mother 69       deceased, was 42 at the age of her first stroke   Stroke Mother    Diabetes Father    Deep vein thrombosis Father    Breast cancer Maternal Aunt    Breast cancer Maternal Aunt    Sleep apnea Neg Hx      Current Outpatient Medications:    amLODipine  (NORVASC ) 5 MG tablet, TAKE 1 TABLET(5 MG) BY MOUTH DAILY, Disp: 90 tablet, Rfl: 3   Blood Glucose Monitoring Suppl (ONE TOUCH ULTRA MINI) w/Device KIT, 1 each by Does not apply route 3 (three) times daily as needed., Disp: 1 kit, Rfl: 1   cabergoline  (DOSTINEX ) 0.5 MG tablet, TAKE 1/2 TABLET BY MOUTH 2 TIMES A WEEK, Disp: 8 tablet, Rfl: 0   doxycycline  (VIBRAMYCIN ) 50 MG capsule, Take 1 capsule (50 mg total) by mouth daily., Disp: 90 capsule, Rfl: 1   EPINEPHrine 0.3 mg/0.3 mL IJ SOAJ injection, Inject 0.3 mLs into the muscle as needed. For Anaphylaxis, Disp: , Rfl:    glucose blood  test strip, Use as instructed, Disp: 100 each, Rfl: 12   hydrALAZINE  (APRESOLINE ) 25 MG tablet, TAKE 1 TABLET(25 MG) BY MOUTH TWICE DAILY, Disp: 180 tablet, Rfl: 2   Lancets (ONETOUCH ULTRASOFT) lancets, Use as instructed, Disp: 100 each, Rfl: 12   MOUNJARO  12.5 MG/0.5ML Pen, ADMINISTER 12.5 MG UNDER THE SKIN 1 TIME A WEEK, Disp: 6 mL, Rfl: 1   nebivolol  (BYSTOLIC ) 10 MG tablet, Take 1 tablet (10 mg total) by mouth daily., Disp: 90 tablet, Rfl: 2   norethindrone  (MICRONOR ) 0.35 MG tablet, Take 1 tablet (0.35 mg total) by mouth daily. (Patient not taking: Reported on 06/12/2024), Disp: 84 tablet, Rfl: 3   spironolactone  (ALDACTONE ) 25 MG tablet, TAKE 1 TABLET(25 MG) BY MOUTH DAILY, Disp: 90 tablet, Rfl: 2   valsartan  (DIOVAN ) 160 MG tablet, TAKE 1 TABLET(160 MG) BY MOUTH DAILY, Disp: 90 tablet, Rfl: 2   Allergies  Allergen Reactions   Sulfamethoxazole -Trimethoprim  Other (See Comments), Hives, Itching and Swelling   Sulfa  Antibiotics     Other Reaction(s): itching; eye swelling     Review of Systems   There were no vitals filed for this visit. There is  no height or weight on file to calculate BMI.  Wt Readings from Last 3 Encounters:  06/02/24 168 lb (76.2 kg)  04/21/24 175 lb (79.4 kg)  03/09/24 175 lb 6.4 oz (79.6 kg)    The 10-year ASCVD risk score (Arnett DK, et al., 2019) is: 7%   Values used to calculate the score:     Age: 58 years     Clincally relevant sex: Female     Is Non-Hispanic African American: Yes     Diabetic: Yes     Tobacco smoker: No     Systolic Blood Pressure: 122 mmHg     Is BP treated: Yes     HDL Cholesterol: 51 mg/dL     Total Cholesterol: 175 mg/dL  Objective:  Physical Exam      Assessment And Plan:  Type 2 diabetes mellitus with obesity (HCC)  Primary hypertension    No follow-ups on file.  Patient was given opportunity to ask questions. Patient verbalized understanding of the plan and was able to repeat key elements of the plan. All  questions were answered to their satisfaction.    Inge Mangle, FNP, have reviewed all documentation for this visit. The documentation on 06/15/24 for the exam, diagnosis, procedures, and orders are all accurate and complete.   IF YOU HAVE BEEN REFERRED TO A SPECIALIST, IT MAY TAKE 1-2 WEEKS TO SCHEDULE/PROCESS THE REFERRAL. IF YOU HAVE NOT HEARD FROM US /SPECIALIST IN TWO WEEKS, PLEASE GIVE US  A CALL AT [phone].

## 2024-06-16 ENCOUNTER — Encounter: Payer: Self-pay | Admitting: Nurse Practitioner

## 2024-06-16 ENCOUNTER — Ambulatory Visit: Payer: Managed Care, Other (non HMO) | Admitting: Nurse Practitioner

## 2024-06-16 VITALS — BP 118/72 | HR 72 | Temp 98.1°F | Ht 64.0 in | Wt 170.2 lb

## 2024-06-16 DIAGNOSIS — Z2821 Immunization not carried out because of patient refusal: Secondary | ICD-10-CM

## 2024-06-16 DIAGNOSIS — I10 Essential (primary) hypertension: Secondary | ICD-10-CM | POA: Diagnosis not present

## 2024-06-16 DIAGNOSIS — L700 Acne vulgaris: Secondary | ICD-10-CM | POA: Diagnosis not present

## 2024-06-16 DIAGNOSIS — E1169 Type 2 diabetes mellitus with other specified complication: Secondary | ICD-10-CM

## 2024-06-16 DIAGNOSIS — N920 Excessive and frequent menstruation with regular cycle: Secondary | ICD-10-CM

## 2024-06-16 DIAGNOSIS — Z79899 Other long term (current) drug therapy: Secondary | ICD-10-CM

## 2024-06-16 DIAGNOSIS — E663 Overweight: Secondary | ICD-10-CM

## 2024-06-16 DIAGNOSIS — Z6829 Body mass index (BMI) 29.0-29.9, adult: Secondary | ICD-10-CM

## 2024-06-17 ENCOUNTER — Encounter: Payer: Self-pay | Admitting: Nurse Practitioner

## 2024-06-17 LAB — CBC WITH DIFFERENTIAL/PLATELET
Basophils Absolute: 0.1 10*3/uL (ref 0.0–0.2)
Basos: 1 %
EOS (ABSOLUTE): 0.2 10*3/uL (ref 0.0–0.4)
Eos: 2 %
Hematocrit: 31 % — ABNORMAL LOW (ref 34.0–46.6)
Hemoglobin: 9.2 g/dL — ABNORMAL LOW (ref 11.1–15.9)
Immature Grans (Abs): 0 10*3/uL (ref 0.0–0.1)
Immature Granulocytes: 0 %
Lymphocytes Absolute: 2.5 10*3/uL (ref 0.7–3.1)
Lymphs: 31 %
MCH: 19.9 pg — ABNORMAL LOW (ref 26.6–33.0)
MCHC: 29.7 g/dL — ABNORMAL LOW (ref 31.5–35.7)
MCV: 67 fL — ABNORMAL LOW (ref 79–97)
Monocytes Absolute: 0.8 10*3/uL (ref 0.1–0.9)
Monocytes: 9 %
Neutrophils Absolute: 4.4 10*3/uL (ref 1.4–7.0)
Neutrophils: 57 %
Platelets: 425 10*3/uL (ref 150–450)
RBC: 4.63 x10E6/uL (ref 3.77–5.28)
RDW: 19.2 % — ABNORMAL HIGH (ref 11.7–15.4)
WBC: 8 10*3/uL (ref 3.4–10.8)

## 2024-06-17 LAB — IRON,TIBC AND FERRITIN PANEL
Ferritin: 15 ng/mL (ref 15–150)
Iron Saturation: 7 % — CL (ref 15–55)
Iron: 28 ug/dL (ref 27–159)
Total Iron Binding Capacity: 426 ug/dL (ref 250–450)
UIBC: 398 ug/dL (ref 131–425)

## 2024-06-17 LAB — BMP8+EGFR
BUN/Creatinine Ratio: 14 (ref 9–23)
BUN: 10 mg/dL (ref 6–24)
CO2: 20 mmol/L (ref 20–29)
Calcium: 9 mg/dL (ref 8.7–10.2)
Chloride: 105 mmol/L (ref 96–106)
Creatinine, Ser: 0.69 mg/dL (ref 0.57–1.00)
Glucose: 76 mg/dL (ref 70–99)
Potassium: 4 mmol/L (ref 3.5–5.2)
Sodium: 139 mmol/L (ref 134–144)
eGFR: 105 mL/min/{1.73_m2} (ref >59)

## 2024-06-17 LAB — HEMOGLOBIN A1C
Est. average glucose Bld gHb Est-mCnc: 111 mg/dL
Hgb A1c MFr Bld: 5.5 % (ref 4.8–5.6)

## 2024-06-22 NOTE — Progress Notes (Unsigned)
 Virtual Visit via Telephone Note   Because of Emmylou G Toledo co-morbid illnesses, she is at least at moderate risk for complications without adequate follow up.  This format is felt to be most appropriate for this patient at this time.  Due to technical limitations with video connection Web designer), today's appointment will be conducted as an audio only telehealth visit, and [employer] verbally agreed to proceed in this manner.   All issues noted in this document were discussed and addressed.  No physical exam could be performed with this format.  Evaluation Performed:  Preoperative cardiovascular risk assessment _____________   Date:  06/22/2024   Patient ID:  Melanie Little, DOB 26-Mar-1971, MRN [id number] Patient Location:  Home Provider location:   Office  Primary Care Provider:  Georgina Speaks, FNP Primary Cardiologist:  None  Chief Complaint / Patient Profile   53 y.o. y/o female with a h/o LVH, OSA, hyperlipidemia who is pending RLH B/L salpingectomy possible right oophorectomy versus cystectomy, B/L salpingectomy, possible Olympus bag with morcellation and presents today for telephonic preoperative cardiovascular risk assessment.  History of Present Illness    Melanie Little is a 53 y.o. female who presents via audio/video conferencing for a telehealth visit today.  Pt was last seen in cardiology clinic on 03/09/2024 by Dr. Raford.  At that time [employer] was doing well .  The patient is now pending procedure as outlined above. Since her last visit, she continues to be stable from a cardiac standpoint.  Today she denies chest pain, shortness of breath, lower extremity edema, fatigue, palpitations, melena, hematuria, hemoptysis, diaphoresis, weakness, presyncope, syncope, orthopnea, and PND.   Past Medical History    Past Medical History:  Diagnosis Date   Abnormal EKG    Abnormal Pap smear of cervix about 2013, 2016   Colpo biopsy with normal result per pt.   colpo 2016 HPV changes   Diabetes mellitus without complication (HCC)    DUB (dysfunctional uterine bleeding)    around 2012, saw gyn at the time for eval   Elevated hemoglobin A1c    Fibroid    multiple fibroids   Hypertension    Left ovarian cyst 05/2018   do yearly ultrasound.  Doing GYN ONC consulation for monitoring.    Migraine    OSA on CPAP    Pituitary adenoma (HCC) 02/09/2023   MIR - 8 x 5 mm   Pure hypercholesterolemia 02/26/2023   Resistant hypertension 05/02/2021   Sleep apnea    Past Surgical History:  Procedure Laterality Date   BREAST REDUCTION SURGERY  2005   CHOLECYSTECTOMY  2004   EYE SURGERY     INCISION AND DRAINAGE Bilateral 2015   Bilateral axillary region ebaceous cyst   IR RADIOLOGIST EVAL & MGMT  09/14/2022   IR RADIOLOGIST EVAL & MGMT  11/08/2022   MASS EXCISION N/A 12/02/2020   Procedure: EXCISION OF TWO MIDLINE BACK SUBCUTANEOUS MASSES;  Surgeon: Lyndel Deward PARAS, MD;  Location:  SURGERY CENTER;  Service: General;  Laterality: N/A;   REDUCTION MAMMAPLASTY Bilateral 2005    Allergies  Allergies  Allergen Reactions   Sulfamethoxazole -Trimethoprim  Other (See Comments), Hives, Itching and Swelling   Sulfa  Antibiotics     Other Reaction(s): itching; eye swelling    Home Medications    Prior to Admission medications   Medication Sig Start Date End Date Taking? Authorizing Provider  amLODipine  (NORVASC ) 5 MG tablet TAKE 1 TABLET(5 MG) BY MOUTH DAILY 04/06/24  Raford Riggs, MD  Blood Glucose Monitoring Suppl (ONE TOUCH ULTRA MINI) w/Device KIT 1 each by Does not apply route 3 (three) times daily as needed. 05/11/21   Brimage, Vondra, DO  cabergoline  (DOSTINEX ) 0.5 MG tablet TAKE 1/2 TABLET BY MOUTH 2 TIMES A WEEK 02/11/24   Georgina Speaks, FNP  doxycycline  (VIBRAMYCIN ) 50 MG capsule Take 1 capsule (50 mg total) by mouth daily. 02/17/24   Georgina Speaks, FNP  EPINEPHrine 0.3 mg/0.3 mL IJ SOAJ injection Inject 0.3 mLs into the muscle as  needed. For Anaphylaxis 11/17/21   [provider]  glucose blood test strip Use as instructed 05/15/21   Brimage, Vondra, DO  hydrALAZINE  (APRESOLINE ) 25 MG tablet TAKE 1 TABLET(25 MG) BY MOUTH TWICE DAILY 06/10/24   Raford Riggs, MD  Lancets [facility] ULTRASOFT) lancets Use as instructed 05/15/21   Brimage, Vondra, DO  MOUNJARO  12.5 MG/0.5ML Pen ADMINISTER 12.5 MG UNDER THE SKIN 1 TIME A WEEK 05/22/24   Georgina Speaks, FNP  nebivolol  (BYSTOLIC ) 10 MG tablet Take 1 tablet (10 mg total) by mouth daily. 06/10/24   Raford Riggs, MD  norethindrone  (MICRONOR ) 0.35 MG tablet Take 1 tablet (0.35 mg total) by mouth daily. Patient not taking: Reported on 06/16/2024 03/03/24   Cathlyn JAYSON Nikki Bobie FORBES, MD  spironolactone  (ALDACTONE ) 25 MG tablet TAKE 1 TABLET(25 MG) BY MOUTH DAILY 06/09/24   Raford Riggs, MD  valsartan  (DIOVAN ) 160 MG tablet TAKE 1 TABLET(160 MG) BY MOUTH DAILY 06/12/24   Raford Riggs, MD    Physical Exam    Vital Signs:  [name] does not have vital signs available for review today.  Given telephonic nature of communication, physical exam is limited. AAOx3. NAD. Normal affect.  Speech and respirations are unlabored.  Accessory Clinical Findings    None  Assessment & Plan    1.  Preoperative Cardiovascular Risk Assessment: Procedure:  RLH, B/L SALPINGECTOMY, POSSIBLE RIGHT OOPHORECTOMY vs CYSTECTOMY FOR ENDOMETRIA, B/L SALPINGECTOMY, POSSIBLE OLYMPUS BAG WITH MORCELLATION    Date of Surgery:  Clearance TBD                                  Surgeon:  DR. SAMMIE Surgeon's Group or Practice Name:  [facility] Phone number:  [phone] Fax number:  [phone] JILL HAMM, RN     Primary Cardiologist: None  Chart reviewed as part of pre-operative protocol coverage. Given past medical history and time since last visit, based on ACC/AHA guidelines, Corissa G Kosch would be at acceptable risk for the planned procedure without further  cardiovascular testing.   Her RCRI is low risk, 0.9% risk of major cardiac event.  She is able to complete greater than 4 METS of physical activity.  Patient was advised that if she develops new symptoms prior to surgery to contact our office to arrange a follow-up appointment.  He verbalized understanding.  I will route this recommendation to the requesting party via Epic fax function and remove from pre-op pool.     Time:   Today, I have spent 5 minutes with the patient with telehealth technology discussing medical history, symptoms, and management plan.  I spent 10 minutes reviewing patient's past cardiac history and cardiac medications.    Josefa CHRISTELLA Beauvais, NP  06/22/2024, 8:34 AM

## 2024-06-23 ENCOUNTER — Ambulatory Visit: Attending: Cardiovascular Disease

## 2024-06-23 DIAGNOSIS — Z01810 Encounter for preprocedural cardiovascular examination: Secondary | ICD-10-CM | POA: Diagnosis not present

## 2024-06-24 ENCOUNTER — Other Ambulatory Visit: Payer: Self-pay | Admitting: Nurse Practitioner

## 2024-06-24 ENCOUNTER — Encounter: Payer: Self-pay | Admitting: Nurse Practitioner

## 2024-06-24 ENCOUNTER — Ambulatory Visit: Payer: Self-pay | Admitting: Nurse Practitioner

## 2024-06-24 DIAGNOSIS — N920 Excessive and frequent menstruation with regular cycle: Secondary | ICD-10-CM

## 2024-06-24 DIAGNOSIS — L700 Acne vulgaris: Secondary | ICD-10-CM | POA: Insufficient documentation

## 2024-06-24 DIAGNOSIS — D500 Iron deficiency anemia secondary to blood loss (chronic): Secondary | ICD-10-CM

## 2024-06-24 MED ORDER — ACCRUFER 30 MG PO CAPS
1.0000 | ORAL_CAPSULE | Freq: Every day | ORAL | 1 refills | Status: DC
Start: 2024-06-24 — End: 2024-09-07

## 2024-06-24 MED ORDER — FERROUS SULFATE 325 (65 FE) MG PO TBEC: 325.0000 mg | DELAYED_RELEASE_TABLET | Freq: Two times a day (BID) | ORAL | 3 refills | Status: DC

## 2024-06-24 NOTE — Assessment & Plan Note (Signed)
 Declines shingrix, educated on disease process and is aware if he changes his mind to notify office

## 2024-06-24 NOTE — Assessment & Plan Note (Signed)
Hgba1c is stable, continue current medications

## 2024-06-24 NOTE — Assessment & Plan Note (Signed)
 She is to have surgery with Dr. Glennon for a hysterectomy and am awaiting surgical clearance papers. Will check appropriate labs

## 2024-06-24 NOTE — Assessment & Plan Note (Signed)

## 2024-06-24 NOTE — Assessment & Plan Note (Signed)
 She has an area to her right chest that is slightly raised with a black head, will refer to dermatology for further evaluation

## 2024-06-24 NOTE — Assessment & Plan Note (Signed)
 Blood pressure is well controlled, continue current medications.

## 2024-06-25 ENCOUNTER — Telehealth: Payer: Self-pay | Admitting: Nurse Practitioner

## 2024-06-25 ENCOUNTER — Ambulatory Visit: Admitting: Orthopedic Surgery

## 2024-06-25 ENCOUNTER — Telehealth: Payer: Self-pay

## 2024-06-25 DIAGNOSIS — M47816 Spondylosis without myelopathy or radiculopathy, lumbar region: Secondary | ICD-10-CM | POA: Diagnosis not present

## 2024-06-25 DIAGNOSIS — D509 Iron deficiency anemia, unspecified: Secondary | ICD-10-CM | POA: Insufficient documentation

## 2024-06-25 NOTE — Telephone Encounter (Signed)
 Auth Submission: NO AUTH NEEDED Site of care: Site of care: [facility] Payer: [employer] commercial Medication & CPT/J Code(s) submitted: Venofer (Iron Sucrose) J1756 Diagnosis Code:  Route of submission (phone, fax, portal):  Phone # Fax # Auth type: Buy/Bill PB Units/visits requested: 200mg  x 5 doses Reference number:  Approval from: 06/25/24 to 10/25/24

## 2024-06-25 NOTE — Progress Notes (Signed)
 Orthopedic Spine Surgery Office Note   Assessment: Patient is a 53 y.o. female with left-sided low back pain in the area of the SI joint and L5/S1 facets. She did not get any relief with SI injection so possibly coming from L5/S1 facets. Other potential etiology is L5/S1 DDD     Plan: -Patient has tried tylenol , ibuprofen , left SI joint injection -Recommended diagnostic/therapeutic L5/S1 facet injections.  Explained that if she gets relief with these, she may be a candidate for RFA -If she does not get relief, then I would recommend core strengthening physical therapy since I do not see a surgical intervention that would provide her with predictable relief -Patient should return to office on an as needed basis     Patient expressed understanding of the plan and all questions were answered to the patient's satisfaction.    ___________________________________________________________________________     History:   Patient is a 53 y.o. female who presents today for follow up on her lumbar spine.  Patient continues to have left-sided lower back pain.  She feels it below the belt line.  She notices the pain consistently and on a daily basis.  She has not found any treatment so far debride her with lasting relief.  Has not developed any new symptoms since she was last seen in the office.  No pain radiating down either lower extremity.  Treatments tried: tylenol , ibuprofen , left SI joint injection      Physical Exam:   General: no acute distress, appears stated age Neurologic: alert, answering questions appropriately, following commands Respiratory: unlabored breathing on room air, symmetric chest rise Psychiatric: appropriate affect, normal cadence to speech     MSK (spine):   -Strength exam                                                   Left                  Right EHL                              5/5                  5/5 TA                                 5/5                   5/5 GSC                             5/5                  5/5 Knee extension            5/5                  5/5 Hip flexion                    5/5                  5/5   -Sensory exam  Sensation intact to light touch in L3-S1 nerve distributions of bilateral lower extremities     Imaging: XRs of the lumbar spine from 05/27/2024 was previously independently reviewed and interpreted, showing disc height loss at L5/S1. No other significant degenerative changes. No evidence of instability on flexion/extension views. No fracture or dislocation seen.   MRI of the lumbar spine from 06/10/2024 was independently reviewed and interpreted, showing disc desiccation at L5/S1.  No significant stenosis seen.  No other significant DDD.     Patient name: Melanie Little Patient MRN: [id number] Date of visit: 06/25/24

## 2024-06-28 ENCOUNTER — Encounter: Payer: Self-pay | Admitting: Sports Medicine

## 2024-06-28 DIAGNOSIS — M545 Low back pain, unspecified: Secondary | ICD-10-CM

## 2024-06-29 NOTE — Telephone Encounter (Signed)
 Dr. Glennon -did you receive surgical clearance from PCP? We received cardiac clearance.

## 2024-06-30 NOTE — Telephone Encounter (Signed)
 Secure chat to PCP to f/u on surgical clearance.

## 2024-07-02 ENCOUNTER — Ambulatory Visit

## 2024-07-02 VITALS — BP 109/67 | HR 75 | Temp 98.0°F | Resp 16 | Ht 64.0 in | Wt 169.2 lb

## 2024-07-02 DIAGNOSIS — D509 Iron deficiency anemia, unspecified: Secondary | ICD-10-CM | POA: Diagnosis not present

## 2024-07-02 MED ORDER — IRON SUCROSE 20 MG/ML IV SOLN
200.0000 mg | Freq: Once | INTRAVENOUS | Status: AC
  Administered 2024-07-02: 200 mg via INTRAVENOUS
  Filled 2024-07-02: qty 10

## 2024-07-02 NOTE — Progress Notes (Signed)
 Diagnosis: Iron Deficiency Anemia  Provider:  Chilton Greathouse MD  Procedure: IV Push  IV Type: Peripheral, IV Location: L Forearm  Venofer (Iron Sucrose), Dose: 200 mg  Post Infusion IV Care: Observation period completed and Peripheral IV Discontinued  Discharge: Condition: Good, Destination: Home . AVS Declined  Performed by:  Loney Hering, LPN

## 2024-07-06 ENCOUNTER — Ambulatory Visit

## 2024-07-06 VITALS — BP 121/77 | HR 84 | Temp 98.0°F | Resp 16 | Ht 64.0 in | Wt 170.8 lb

## 2024-07-06 DIAGNOSIS — N920 Excessive and frequent menstruation with regular cycle: Secondary | ICD-10-CM

## 2024-07-06 DIAGNOSIS — D500 Iron deficiency anemia secondary to blood loss (chronic): Secondary | ICD-10-CM | POA: Diagnosis not present

## 2024-07-06 DIAGNOSIS — D509 Iron deficiency anemia, unspecified: Secondary | ICD-10-CM

## 2024-07-06 MED ORDER — IRON SUCROSE 20 MG/ML IV SOLN
200.0000 mg | Freq: Once | INTRAVENOUS | Status: AC
  Administered 2024-07-06: 200 mg via INTRAVENOUS
  Filled 2024-07-06: qty 10

## 2024-07-06 NOTE — Progress Notes (Signed)
 Diagnosis: Iron Deficiency Anemia  Provider:  Chilton Greathouse MD  Procedure: IV Push  IV Type: Peripheral, IV Location: R Forearm  Venofer (Iron Sucrose), Dose: 200 mg  Post Infusion IV Care: Observation period completed and Peripheral IV Discontinued  Discharge: Condition: Good, Destination: Home . AVS Provided  Performed by:  Rico Ala, LPN

## 2024-07-08 ENCOUNTER — Ambulatory Visit

## 2024-07-08 VITALS — BP 137/80 | HR 74 | Temp 98.9°F | Resp 16 | Ht 64.0 in | Wt 171.2 lb

## 2024-07-08 DIAGNOSIS — D500 Iron deficiency anemia secondary to blood loss (chronic): Secondary | ICD-10-CM

## 2024-07-08 DIAGNOSIS — D509 Iron deficiency anemia, unspecified: Secondary | ICD-10-CM

## 2024-07-08 DIAGNOSIS — N920 Excessive and frequent menstruation with regular cycle: Secondary | ICD-10-CM

## 2024-07-08 MED ORDER — IRON SUCROSE 20 MG/ML IV SOLN
200.0000 mg | Freq: Once | INTRAVENOUS | Status: AC
  Administered 2024-07-08: 200 mg via INTRAVENOUS
  Filled 2024-07-08: qty 10

## 2024-07-08 NOTE — Progress Notes (Signed)
 Diagnosis: Iron  Deficiency Anemia  Provider:  Praveen Mannam MD  Procedure: IV Push  IV Type: Peripheral, IV Location: L Hand  Venofer  (Iron  Sucrose), Dose: 200 mg  Post Infusion IV Care: Observation period completed  Discharge: Condition: Good, Destination: Home . AVS Provided  Performed by:  Eleanor DELENA Bloch, RN

## 2024-07-10 ENCOUNTER — Ambulatory Visit (INDEPENDENT_AMBULATORY_CARE_PROVIDER_SITE_OTHER)

## 2024-07-10 VITALS — BP 110/66 | HR 73 | Temp 99.2°F | Resp 16 | Ht 64.0 in | Wt 170.4 lb

## 2024-07-10 DIAGNOSIS — N920 Excessive and frequent menstruation with regular cycle: Secondary | ICD-10-CM

## 2024-07-10 DIAGNOSIS — D500 Iron deficiency anemia secondary to blood loss (chronic): Secondary | ICD-10-CM

## 2024-07-10 DIAGNOSIS — D509 Iron deficiency anemia, unspecified: Secondary | ICD-10-CM

## 2024-07-10 MED ORDER — SODIUM CHLORIDE 0.9 % IV BOLUS
250.0000 mL | Freq: Once | INTRAVENOUS | Status: AC
  Administered 2024-07-10: 250 mL via INTRAVENOUS
  Filled 2024-07-10: qty 250

## 2024-07-10 MED ORDER — IRON SUCROSE 20 MG/ML IV SOLN
200.0000 mg | Freq: Once | INTRAVENOUS | Status: AC
  Administered 2024-07-10: 200 mg via INTRAVENOUS
  Filled 2024-07-10: qty 10

## 2024-07-10 NOTE — Progress Notes (Signed)
 Diagnosis: Acute Anemia  Provider:  Chilton Greathouse MD  Procedure: IV Push  IV Type: Peripheral, IV Location: R Hand  Venofer (Iron Sucrose), Dose: 200 mg  Post Infusion IV Care: Patient declined observation and Peripheral IV Discontinued  Discharge: Condition: Good, Destination: Home . AVS Declined  Performed by:  Nat Math, RN

## 2024-07-13 ENCOUNTER — Ambulatory Visit

## 2024-07-13 VITALS — BP 130/79 | HR 72 | Temp 98.7°F | Resp 16 | Ht 64.0 in | Wt 172.4 lb

## 2024-07-13 DIAGNOSIS — D509 Iron deficiency anemia, unspecified: Secondary | ICD-10-CM

## 2024-07-13 MED ORDER — IRON SUCROSE 20 MG/ML IV SOLN
200.0000 mg | Freq: Once | INTRAVENOUS | Status: AC
  Administered 2024-07-13: 200 mg via INTRAVENOUS
  Filled 2024-07-13: qty 10

## 2024-07-13 MED ORDER — SODIUM CHLORIDE 0.9 % IV BOLUS
250.0000 mL | Freq: Once | INTRAVENOUS | Status: AC
  Administered 2024-07-13: 250 mL via INTRAVENOUS
  Filled 2024-07-13: qty 250

## 2024-07-13 NOTE — Progress Notes (Signed)
 Diagnosis: Acute Anemia  Provider:  Chilton Greathouse MD  Procedure: IV Push  IV Type: Peripheral, IV Location: L Forearm  Venofer (Iron Sucrose), Dose: 200 mg  Post Infusion IV Care: Patient declined observation and Peripheral IV Discontinued  Discharge: Condition: Good, Destination: Home . AVS Declined  Performed by:  Nat Math, RN

## 2024-07-13 NOTE — Telephone Encounter (Signed)
 See surgery referral.   Encounter closed.

## 2024-07-18 ENCOUNTER — Other Ambulatory Visit: Payer: Self-pay

## 2024-07-18 ENCOUNTER — Ambulatory Visit: Attending: Orthopedic Surgery

## 2024-07-18 DIAGNOSIS — M5459 Other low back pain: Secondary | ICD-10-CM | POA: Diagnosis present

## 2024-07-18 DIAGNOSIS — M545 Low back pain, unspecified: Secondary | ICD-10-CM | POA: Insufficient documentation

## 2024-07-18 NOTE — Therapy (Signed)
 OUTPATIENT PHYSICAL THERAPY THORACOLUMBAR EVALUATION   Patient Name: Melanie Little MRN: [id number] DOB:07-15-1971, 53 y.o., female Today's Date: 07/18/2024  END OF SESSION:   Past Medical History:  Diagnosis Date   Abnormal EKG    Abnormal Pap smear of cervix about 2013, 2016   Colpo biopsy with normal result per pt.  colpo 2016 HPV changes   Diabetes mellitus without complication (HCC)    DUB (dysfunctional uterine bleeding)    around 2012, saw gyn at the time for eval   Elevated hemoglobin A1c    Fibroid    multiple fibroids   Hypertension    Left ovarian cyst 05/2018   do yearly ultrasound.  Doing GYN ONC consulation for monitoring.    Migraine    OSA on CPAP    Pituitary adenoma (HCC) 02/09/2023   MIR - 8 x 5 mm   Pure hypercholesterolemia 02/26/2023   Resistant hypertension 05/02/2021   Sleep apnea    Past Surgical History:  Procedure Laterality Date   BREAST REDUCTION SURGERY  2005   CHOLECYSTECTOMY  2004   EYE SURGERY     INCISION AND DRAINAGE Bilateral 2015   Bilateral axillary region ebaceous cyst   IR RADIOLOGIST EVAL & MGMT  09/14/2022   IR RADIOLOGIST EVAL & MGMT  11/08/2022   MASS EXCISION N/A 12/02/2020   Procedure: EXCISION OF TWO MIDLINE BACK SUBCUTANEOUS MASSES;  Surgeon: Lyndel Deward PARAS, MD;  Location: [facility];  Service: General;  Laterality: N/A;   REDUCTION MAMMAPLASTY Bilateral 2005   Patient Active Problem List   Diagnosis Date Noted   Iron  deficiency anemia 06/25/2024   Closed comedone 06/24/2024   Herpes zoster vaccination declined 02/17/2024   Encounter for annual health examination 10/27/2023   Influenza vaccination declined 10/27/2023   Need for zoster vaccination 10/27/2023   COVID-19 vaccination declined 10/27/2023   Boil 10/27/2023   Decreased energy 10/27/2023   Chronic left-sided low back pain without sciatica 06/17/2023   OSA on CPAP 03/14/2023   Primary hypertension 02/26/2023   Pure  hypercholesterolemia 02/26/2023   Prolactin increased 02/26/2023   Pituitary microadenoma (HCC) 11/20/2022   Intractable chronic migraine without aura and with status migrainosus 11/20/2022   Amenorrhea 11/20/2022   Encounter for birth control pills maintenance 05/02/2021   Resistant hypertension 05/02/2021   LVH (left ventricular hypertrophy) 01/09/2021   Nonspecific abnormal electrocardiogram (ECG) (EKG) 12/16/2020   Headache 03/12/2018   Numbness and tingling of left upper extremity 03/12/2018   Elevated CA-125 10/21/2017   Recurrent boils 07/30/2017   OSA (obstructive sleep apnea) 04/30/2017   Chronic hypokalemia 04/30/2017   Obese 04/30/2017   Fibroid, uterine 08/19/2016   History of abnormal cervical Pap smear 08/19/2016   Snoring 10/13/2015   Hypersomnia with sleep apnea 10/13/2015   Menorrhagia with regular cycle 09/19/2015   Left ovarian cyst 09/19/2015   Type 2 diabetes mellitus with obesity (HCC) 06/08/2015    PCP: Georgina Speaks, FNP  REFERRING PROVIDER:  Georgina Ozell LABOR, MD   REFERRING DIAG:  M54.50 (ICD-10-CM) - Lumbar pain    Rationale for Evaluation and Treatment: Rehabilitation  THERAPY DIAG:  No diagnosis found.  ONSET DATE: 06/29/2024 date of referral   SUBJECTIVE:  SUBJECTIVE STATEMENT: Patient presents to PT d/t constant left-sided low back pain, that has been present for at least 2 years without known MOI. She has tried OTC medications, and left SI joint injection. She has occasional shooting pain in L leg. At this time, she continues to feel limited with daily activities d/t the pain.    PERTINENT HISTORY:  Relevant PMHx includes DM, DUB, Fibroids, HTN, L ovarian cyst, migraine, OSA, HTN, reduction mammaplasty, L ventricular hypertrophy, Obesity  PAIN:  Are you  having pain?  Yes: NPRS scale: 7/10 current/worst Pain location: L Left sided lumbar pain  Pain description: pressure  Aggravating factors: lifting, mopping, laying down with leg straight or on left  Relieving factors: laying with knees bent, heating pad   PRECAUTIONS: None  RED FLAGS: None   WEIGHT BEARING RESTRICTIONS: No  FALLS:  Has patient fallen in last 6 months? No  LIVING ENVIRONMENT: Lives with: lives with their spouse Lives in: House/apartment   OCCUPATION: mental health assessment -- desk job  PLOF: Independent  PATIENT GOALS: ***  NEXT MD VISIT: 07/23/24 with Dr. Glennon, followed by planned Total Hysterectomy Proceduce on 09/11/2024  OBJECTIVE:  Note: Objective measures were completed at Evaluation unless otherwise noted.  DIAGNOSTIC FINDINGS:   06/10/2024 Lumbar MRI  IMPRESSION: L5-S1 has mild degenerative disc disease and facet arthritis. No degenerative marrow edema.   This level also has mild bilateral degenerative lateral recess narrowing.   The imaged levels are otherwise unremarkable.   05/24/2024 Lumbar Xray   XRs of the lumbar spine from 05/27/2024 was independently reviewed and  interpreted, showing disc height loss at L5/S1. No other significant  degenerative changes. No evidence of instability on flexion/extension  views. No fracture or dislocation seen.  PATIENT SURVEYS:   Modified Oswestry:   MODIFIED OSWESTRY DISABILITY SCALE  Date: 07/18/2024  Score  Pain intensity {ODI 1:32962}  2. Personal care (washing, dressing, etc.) {ODI 2:32963}  3. Lifting {ODI 3:32964}  4. Walking {ODI 4:32965}  5. Sitting {ODI 5:32966}  6. Standing {ODI 6:32967}  7. Sleeping {ODI 7:32968}  8. Social Life {ODI 8:32969}  9. Traveling {ODI 9:32970}  10. Employment/ Homemaking {ODI 10:32971}  Total ***/50   Interpretation of scores: Score Category Description  0-20% Minimal Disability The patient can cope with most living activities. Usually no  treatment is indicated apart from advice on lifting, sitting and exercise  21-40% Moderate Disability The patient experiences more pain and difficulty with sitting, lifting and standing. Travel and social life are more difficult and they may be disabled from work. Personal care, sexual activity and sleeping are not grossly affected, and the patient can usually be managed by conservative means  41-60% Severe Disability Pain remains the main problem in this group, but activities of daily living are affected. These patients require a detailed investigation  61-80% Crippled Back pain impinges on all aspects of the patient's life. Positive intervention is required  81-100% Bed-bound  These patients are either bed-bound or exaggerating their symptoms  Bluford FORBES Zoe DELENA Karon DELENA, et al. Surgery versus conservative management of stable thoracolumbar fracture: the PRESTO feasibility RCT. [place] ([place]): [employer]; 2021 Nov. [facility] Technology Assessment, No. 25.62.) Appendix 3, Oswestry Disability Index category descriptors. Available from: FindJewel[employer]cz  Minimally Clinically Important Difference (MCID) = 12.8%   COGNITION: Overall cognitive status: Within functional limits for tasks assessed     SENSATION: {sensation:27233}  MUSCLE LENGTH: Hamstrings: Right *** deg; Left *** deg Debby test: Right *** deg; Left ***  deg  POSTURE: {posture:25561}  PALPATION: ***   SIJ -   L hip adduction < L hip abduction < SIJ compression +    LUMBAR ROM:   AROM eval  Flexion   Extension   Right lateral flexion   Left lateral flexion   Right rotation   Left rotation    (Blank rows = not tested)  LOWER EXTREMITY ROM:     Active  Right eval Left eval  Hip flexion    Hip extension    Hip abduction    Hip adduction    Hip internal rotation    Hip external rotation    Knee flexion    Knee extension    Ankle dorsiflexion    Ankle  plantarflexion    Ankle inversion    Ankle eversion     (Blank rows = not tested)  LOWER EXTREMITY MMT:    MMT Right eval Left eval  Hip flexion    Hip extension    Hip abduction    Hip adduction    Hip internal rotation    Hip external rotation    Knee flexion    Knee extension    Ankle dorsiflexion    Ankle plantarflexion    Ankle inversion    Ankle eversion     (Blank rows = not tested)  LUMBAR SPECIAL TESTS:  Straight leg raise test: {pos/neg:25243}, Slump test: {pos/neg:25243}, Single leg stance test: {pos/neg:25243}, SI Compression/distraction test: {pos/neg:25243}, and FABER test: {pos/neg:25243}  FUNCTIONAL TESTS:  30 seconds chair stand test 2 minute walk test: ***  GAIT: Distance walked: *** Assistive device utilized: {Assistive devices:23999} Level of assistance: {Levels of assistance:24026} Comments: ***  TREATMENT DATE:   [facility] Adult PT Treatment:                                                DATE: 07/18/2024   Initial evaluation: see patient education and home exercise program as noted below                                                                                                                                   PATIENT EDUCATION:  Education details: reviewed initial home exercise program; discussion of POC, prognosis and goals for skilled PT   Person educated: Patient Education method: Explanation, Demonstration, and Handouts Education comprehension: verbalized understanding, returned demonstration, and needs further education  HOME EXERCISE PROGRAM: ***  ASSESSMENT:  CLINICAL IMPRESSION: Anapaula is a 53 y.o. female  who was seen today for physical therapy evaluation and treatment for ***. She is demonstrating ***. She has related pain and difficulty with ***. She requires skilled PT services at this time to address relevant deficits and improve overall function.     OBJECTIVE IMPAIRMENTS: {opptimpairments:25111}.   ACTIVITY  LIMITATIONS: {activitylimitations:27494}  PARTICIPATION LIMITATIONS: {participationrestrictions:25113}  PERSONAL FACTORS: {Personal factors:25162} are also affecting patient's functional outcome.   REHAB POTENTIAL: {rehabpotential:25112}  CLINICAL DECISION MAKING: {clinical decision making:25114}  EVALUATION COMPLEXITY: {Evaluation complexity:25115}   GOALS: Goals reviewed with patient? YES  SHORT TERM GOALS: Target date: 08/15/2024    Patient will be independent with initial home program at least 3 days/week.  Baseline: provided at eval Goal Status: INITIAL   2.  Patient will demonstrate improved postural awareness for at least 15 minutes while seated without need for cueing from PT.  Baseline: see objective measures Goal Status: INITIAL   3.  *** Baseline:  Goal status: INITIAL   LONG TERM GOALS: Target date: 09/10/2024    Patient will report improved overall functional ability with modified ODI score of ***.  Baseline:  Goal Status: INITIAL    2.  *** Baseline:  Goal status: INITIAL  3.  *** Baseline:  Goal status: INITIAL  4.  *** Baseline:  Goal status: INITIAL    PLAN:  PT FREQUENCY: 1-2x/week  PT DURATION: 8 weeks  PLANNED INTERVENTIONS: 97164- PT Re-evaluation, 97750- Physical Performance Testing, 97110-Therapeutic exercises, 97530- Therapeutic activity, [id number]- Neuromuscular re-education, 97535- Self Care, 02859- Manual therapy, [id number]- Aquatic Therapy, H9716- Electrical stimulation (unattended), [id number]- Electrical stimulation (manual), [id number]- Traction (mechanical), 20560 (1-2 muscles), 20561 (3+ muscles)- Dry Needling, Patient/Family education, Taping, Joint mobilization, Spinal mobilization, Cryotherapy, and Moist heat.  PLAN FOR NEXT SESSION: PIERRETTE Marko Molt, PT, DPT  07/18/2024 7:10 AM

## 2024-07-23 ENCOUNTER — Encounter: Payer: Self-pay | Admitting: Obstetrics and Gynecology

## 2024-07-23 ENCOUNTER — Ambulatory Visit: Admitting: Obstetrics and Gynecology

## 2024-07-23 ENCOUNTER — Other Ambulatory Visit (HOSPITAL_COMMUNITY)
Admission: RE | Admit: 2024-07-23 | Discharge: 2024-07-23 | Disposition: A | Discharge status: Home / Self Care | Source: Ambulatory Visit | Attending: Obstetrics and Gynecology | Admitting: Obstetrics and Gynecology

## 2024-07-23 VITALS — BP 126/84 | HR 80

## 2024-07-23 DIAGNOSIS — Z01812 Encounter for preprocedural laboratory examination: Secondary | ICD-10-CM

## 2024-07-23 DIAGNOSIS — N809 Endometriosis, unspecified: Secondary | ICD-10-CM | POA: Insufficient documentation

## 2024-07-23 DIAGNOSIS — D219 Benign neoplasm of connective and other soft tissue, unspecified: Secondary | ICD-10-CM

## 2024-07-23 DIAGNOSIS — N83201 Unspecified ovarian cyst, right side: Secondary | ICD-10-CM

## 2024-07-23 DIAGNOSIS — N920 Excessive and frequent menstruation with regular cycle: Secondary | ICD-10-CM | POA: Insufficient documentation

## 2024-07-23 DIAGNOSIS — N898 Other specified noninflammatory disorders of vagina: Secondary | ICD-10-CM

## 2024-07-23 LAB — PREGNANCY, URINE: Preg Test, Ur: NEGATIVE

## 2024-07-23 MED ORDER — PROGESTERONE MICRONIZED 100 MG PO CAPS: 100.0000 mg | ORAL_CAPSULE | Freq: Every day | ORAL | 11 refills | Status: DC

## 2024-07-23 NOTE — Progress Notes (Signed)
 GYNECOLOGY  VISIT   HPI: 53 y.o.   Married  [place] female  Presenting for surgical consult from Dr. Nikki and to complete EMB [id number] with Patient's last menstrual period was 07/21/2024 (exact date).   Patient with heavy bleeding each month and anemia Has large fibroids She has heavy bleeding with clots for 4 days then has vaginal bleeding without clots for another 6 days each month She is using 5ppd in the daytime and 3 overnight pads each night. She is not on iron  but has anemia and sickle cell trait She does report fatigue She denies any dysmenorrhea but feels pelvic pressure She has discussed options with Dr. Nikki and would like a definitive treatment with the robotic hysterectomy US  with 16cm uterus and possible right endometrioma  GYNECOLOGIC HISTORY: Patient's last menstrual period was 07/21/2024 (exact date). Contraception: POPs Menopausal hormone therapy: n/a Last 2 paps: 2020-WNL, HPV- neg, 10/09/2022-WNL History of abnormal Pap or positive HPV:  no Mammogram: 06/07/2023-WNL, Cat B        OB History     Gravida  4   Para  1   Term  1   Preterm  0   AB  3   Living  1      SAB  0   IAB  3   Ectopic  0   Multiple  0   Live Births  1              Patient Active Problem List   Diagnosis Date Noted   Iron  deficiency anemia 06/25/2024   Closed comedone 06/24/2024   Herpes zoster vaccination declined 02/17/2024   Encounter for annual health examination 10/27/2023   Influenza vaccination declined 10/27/2023   Need for zoster vaccination 10/27/2023   COVID-19 vaccination declined 10/27/2023   Boil 10/27/2023   Decreased energy 10/27/2023   Chronic left-sided low back pain without sciatica 06/17/2023   OSA on CPAP 03/14/2023   Primary hypertension 02/26/2023   Pure hypercholesterolemia 02/26/2023   Prolactin increased 02/26/2023   Pituitary microadenoma (HCC) 11/20/2022   Intractable chronic migraine without aura and with status  migrainosus 11/20/2022   Amenorrhea 11/20/2022   Encounter for birth control pills maintenance 05/02/2021   Resistant hypertension 05/02/2021   LVH (left ventricular hypertrophy) 01/09/2021   Nonspecific abnormal electrocardiogram (ECG) (EKG) 12/16/2020   Headache 03/12/2018   Numbness and tingling of left upper extremity 03/12/2018   Elevated CA-125 10/21/2017   Recurrent boils 07/30/2017   OSA (obstructive sleep apnea) 04/30/2017   Chronic hypokalemia 04/30/2017   Obese 04/30/2017   Fibroid, uterine 08/19/2016   History of abnormal cervical Pap smear 08/19/2016   Snoring 10/13/2015   Hypersomnia with sleep apnea 10/13/2015   Menorrhagia with regular cycle 09/19/2015   Left ovarian cyst 09/19/2015   Type 2 diabetes mellitus with obesity (HCC) 06/08/2015    Past Medical History:  Diagnosis Date   Abnormal EKG    Abnormal Pap smear of cervix about 2013, 2016   Colpo biopsy with normal result per pt.  colpo 2016 HPV changes   Diabetes mellitus without complication (HCC)    DUB (dysfunctional uterine bleeding)    around 2012, saw gyn at the time for eval   Elevated hemoglobin A1c    Fibroid    multiple fibroids   Hypertension    Left ovarian cyst 05/2018   do yearly ultrasound.  Doing GYN ONC consulation for monitoring.    Migraine    OSA on CPAP  Pituitary adenoma (HCC) 02/09/2023   MIR - 8 x 5 mm   Pure hypercholesterolemia 02/26/2023   Resistant hypertension 05/02/2021   Sleep apnea     Past Surgical History:  Procedure Laterality Date   BREAST REDUCTION SURGERY  2005   CHOLECYSTECTOMY  2004   EYE SURGERY     INCISION AND DRAINAGE Bilateral 2015   Bilateral axillary region ebaceous cyst   IR RADIOLOGIST EVAL & MGMT  09/14/2022   IR RADIOLOGIST EVAL & MGMT  11/08/2022   MASS EXCISION N/A 12/02/2020   Procedure: EXCISION OF TWO MIDLINE BACK SUBCUTANEOUS MASSES;  Surgeon: Lyndel Deward PARAS, MD;  Location: [facility];  Service: General;   Laterality: N/A;   REDUCTION MAMMAPLASTY Bilateral 2005    Current Outpatient Medications  Medication Sig Dispense Refill   amLODipine  (NORVASC ) 5 MG tablet TAKE 1 TABLET(5 MG) BY MOUTH DAILY 90 tablet 3   cabergoline  (DOSTINEX ) 0.5 MG tablet TAKE 1/2 TABLET BY MOUTH 2 TIMES A WEEK 8 tablet 0   doxycycline  (VIBRAMYCIN ) 50 MG capsule Take 1 capsule (50 mg total) by mouth daily. 90 capsule 1   EPINEPHrine 0.3 mg/0.3 mL IJ SOAJ injection Inject 0.3 mLs into the muscle as needed. For Anaphylaxis     Ferric Maltol  (ACCRUFER ) 30 MG CAPS Take 1 capsule (30 mg total) by mouth daily. 90 capsule 1   ferrous sulfate  325 (65 FE) MG EC tablet Take 1 tablet by mouth 2 (two) times daily.     hydrALAZINE  (APRESOLINE ) 25 MG tablet TAKE 1 TABLET(25 MG) BY MOUTH TWICE DAILY 180 tablet 2   MOUNJARO  12.5 MG/0.5ML Pen ADMINISTER 12.5 MG UNDER THE SKIN 1 TIME A WEEK 6 mL 1   nebivolol  (BYSTOLIC ) 10 MG tablet Take 1 tablet (10 mg total) by mouth daily. 90 tablet 2   spironolactone  (ALDACTONE ) 25 MG tablet TAKE 1 TABLET(25 MG) BY MOUTH DAILY 90 tablet 2   valsartan  (DIOVAN ) 160 MG tablet TAKE 1 TABLET(160 MG) BY MOUTH DAILY 90 tablet 2   Blood Glucose Monitoring Suppl (ONE TOUCH ULTRA MINI) w/Device KIT 1 each by Does not apply route 3 (three) times daily as needed. (Patient not taking: Reported on 07/23/2024) 1 kit 1   glucose blood test strip Use as instructed (Patient not taking: Reported on 07/23/2024) 100 each 12   Lancets (ONETOUCH ULTRASOFT) lancets Use as instructed (Patient not taking: Reported on 07/23/2024) 100 each 12   norethindrone  (MICRONOR ) 0.35 MG tablet Take 1 tablet (0.35 mg total) by mouth daily. (Patient not taking: Reported on 07/23/2024) 84 tablet 3   No current facility-administered medications for this visit.     ALLERGIES: Sulfamethoxazole -trimethoprim  and Sulfa  antibiotics  Family History  Problem Relation Age of Onset   CVA Mother 86       deceased, was 52 at the age of her first stroke    Stroke Mother    Diabetes Father    Deep vein thrombosis Father    Breast cancer Maternal Aunt    Heart attack Maternal Aunt    Breast cancer Maternal Aunt    Sleep apnea Neg Hx     Social History   Socioeconomic History   Marital status: Married    Spouse name: Not on file   Number of children: 1   Years of education: Not on file   Highest education level: Master's degree (e.g., MA, MS, MEng, MEd, MSW, MBA)  Occupational History   Occupation: [employer]  Tobacco Use  Smoking status: Never   Smokeless tobacco: Never  Vaping Use   Vaping status: Never Used  Substance and Sexual Activity   Alcohol use: No   Drug use: Never   Sexual activity: Yes    Birth control/protection: Pill    Comment: Micronor   Other Topics Concern   Not on file  Social History Narrative   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[employer]      Home Situation: lives with husband      Spiritual Beliefs: Baptist      Lifestyle: walks every other day; diet is bad      Social Drivers of Corporate investment banker Strain: Low Risk  (02/13/2024)   Overall Financial Resource Strain (CARDIA)    Difficulty of Paying Living Expenses: Not hard at all  Food Insecurity: No Food Insecurity (02/13/2024)   Hunger Vital Sign    Worried About Running Out of Food in the Last Year: Never true    Ran Out of Food in the Last Year: Never true  Transportation Needs: No Transportation Needs (02/13/2024)   PRAPARE - Administrator, Civil Service (Medical): No    Lack of Transportation (Non-Medical): No  Physical Activity: Unknown (02/13/2024)   Exercise Vital Sign    Days of Exercise per Week: 0 days    Minutes of Exercise per Session: Not on file  Stress: No Stress Concern Present (02/13/2024)   [employer] of Occupational Health - Occupational Stress Questionnaire    Feeling of Stress : Not at all  Social Connections: Moderately Integrated  (02/13/2024)   Social Connection and Isolation Panel    Frequency of Communication with Friends and Family: Twice a week    Frequency of Social Gatherings with Friends and Family: Once a week    Attends Religious Services: More than 4 times per year    Active Member of [employer] or Organizations: No    Attends Engineer, structural: Not on file    Marital Status: Married  Catering manager Violence: Not on file    Review of Systems  See HPI.   PHYSICAL EXAMINATION:   BP 126/84 (BP Location: Left Arm, Patient Position: Sitting)   Pulse 80   LMP 07/21/2024 (Exact Date)   SpO2 98%     General appearance: alert, cooperative and appears stated age   Pelvic US  Uterus 16.26. 11.08 x 11.60 cm.  Fibroids:  intramural and subserosal:  5.58 cm, 4.18 cm, 4.47 cm, 2.47 cm, 2.80 cm, 6.36 cm,1.54 cm. EMS 7 mm. Symmetrical.  Left ovary 2.23 cm. x 2.16 cm x 2.29 cm.   Normal.  Right ovary 6.44 x 5.69 x 5.45 cm.  Right ovarian cyst solid, avascular 41 x 42 mm.  Possible endometrioma. No adnexal masses.  No free fluid.    PROCEDURE: EMB TIME OUT PERFORMED Consent obtained for the procedure.  A bivalve speculum was placed in the vagina.  The cervix was grasped with a single tooth tenaculum.  Pipelle was inserted and rotated.  Uterus sound to 16cm. Adequate specimen was obtained and sent to pathology.  All instruments were removed.  Patient tolerated the procedure well.  To notify patient of the results.  ASSESSMENT:  Fibroids.  Right ovarian cyst, possible endometrioma. Microadenoma of pituitary gland.  On Dostinex .  On Micronor .  DM.  HTN.  Hx keloids.  Menorrhagia EMB today prior to surgery PLAN:  All options reviewed again. Symptomatic fibroid uterus:  Counseled on all options.  She would like to have the [facility].  Counseled extensively on the procedure including but not limited to what to expect and risks and benefits.  Counseled on postop care and pelvic rest for 10 weeks after the  surgery with restricted lifting for 6 weeks after.  Counseled on the benefits of the robotic procedure with faster return to daily activities, improved outcomes, and less risk for complications. She would like to have this scheduled. Discussed and counseled on possible olympus bag and morcellation and how this is performed. She agreed.  To get cardiac and medical clearance Patient would like surgery in December with the holidays EMB  collected today Continue prometrium  to help reduce her bleeding until surgery  Dr. Glennon

## 2024-07-24 LAB — SURESWAB® ADVANCED VAGINITIS PLUS,TMA
C. trachomatis RNA, TMA: NOT DETECTED
CANDIDA SPECIES: NOT DETECTED
Candida glabrata: NOT DETECTED
N. gonorrhoeae RNA, TMA: NOT DETECTED
SURESWAB(R) ADV BACTERIAL VAGINOSIS(BV),TMA: NEGATIVE
TRICHOMONAS VAGINALIS (TV),TMA: NOT DETECTED

## 2024-07-25 ENCOUNTER — Ambulatory Visit

## 2024-07-25 DIAGNOSIS — M5459 Other low back pain: Secondary | ICD-10-CM | POA: Diagnosis not present

## 2024-07-25 NOTE — Therapy (Signed)
 OUTPATIENT PHYSICAL THERAPY EVALUATION   Patient Name: ADRENA NAKAMURA MRN: [id number] DOB:06/03/1971, 53 y.o., female Today's Date: 07/25/2024  END OF SESSION:   PT End of Session - 07/25/24 0812     Visit Number 2    Number of Visits 16    Date for PT Re-Evaluation 09/10/24    Authorization Type [employer] Managed    PT Start Time 0815    PT Stop Time 0902    PT Time Calculation (min) 47 min    Activity Tolerance Patient tolerated treatment well    Behavior During Therapy [facility] for tasks assessed/performed            PT End of Session - 07/25/24 9187     Visit Number 2    Number of Visits 16    Date for PT Re-Evaluation 09/10/24    Authorization Type [employer] Managed    PT Start Time 0815    PT Stop Time 0902    PT Time Calculation (min) 47 min    Activity Tolerance Patient tolerated treatment well    Behavior During Therapy [facility] for tasks assessed/performed          Past Medical History:  Diagnosis Date   Abnormal EKG    Abnormal Pap smear of cervix about 2013, 2016   Colpo biopsy with normal result per pt.  colpo 2016 HPV changes   Diabetes mellitus without complication (HCC)    DUB (dysfunctional uterine bleeding)    around 2012, saw gyn at the time for eval   Elevated hemoglobin A1c    Fibroid    multiple fibroids   Hypertension    Left ovarian cyst 05/2018   do yearly ultrasound.  Doing GYN ONC consulation for monitoring.    Migraine    OSA on CPAP    Pituitary adenoma (HCC) 02/09/2023   MIR - 8 x 5 mm   Pure hypercholesterolemia 02/26/2023   Resistant hypertension 05/02/2021   Sleep apnea    Past Surgical History:  Procedure Laterality Date   BREAST REDUCTION SURGERY  2005   CHOLECYSTECTOMY  2004   EYE SURGERY     INCISION AND DRAINAGE Bilateral 2015   Bilateral axillary region ebaceous cyst   IR RADIOLOGIST EVAL & MGMT  09/14/2022   IR RADIOLOGIST EVAL & MGMT  11/08/2022   MASS EXCISION N/A 12/02/2020   Procedure: EXCISION OF TWO MIDLINE BACK  SUBCUTANEOUS MASSES;  Surgeon: Lyndel Deward PARAS, MD;  Location: [facility];  Service: General;  Laterality: N/A;   REDUCTION MAMMAPLASTY Bilateral 2005   Patient Active Problem List   Diagnosis Date Noted   Iron  deficiency anemia 06/25/2024   Closed comedone 06/24/2024   Herpes zoster vaccination declined 02/17/2024   Encounter for annual health examination 10/27/2023   Influenza vaccination declined 10/27/2023   Need for zoster vaccination 10/27/2023   COVID-19 vaccination declined 10/27/2023   Boil 10/27/2023   Decreased energy 10/27/2023   Chronic left-sided low back pain without sciatica 06/17/2023   OSA on CPAP 03/14/2023   Primary hypertension 02/26/2023   Pure hypercholesterolemia 02/26/2023   Prolactin increased 02/26/2023   Pituitary microadenoma (HCC) 11/20/2022   Intractable chronic migraine without aura and with status migrainosus 11/20/2022   Amenorrhea 11/20/2022   Encounter for birth control pills maintenance 05/02/2021   Resistant hypertension 05/02/2021   LVH (left ventricular hypertrophy) 01/09/2021   Nonspecific abnormal electrocardiogram (ECG) (EKG) 12/16/2020   Headache 03/12/2018   Numbness and tingling of left upper extremity 03/12/2018  Elevated CA-125 10/21/2017   Recurrent boils 07/30/2017   OSA (obstructive sleep apnea) 04/30/2017   Chronic hypokalemia 04/30/2017   Obese 04/30/2017   Fibroid, uterine 08/19/2016   History of abnormal cervical Pap smear 08/19/2016   Snoring 10/13/2015   Hypersomnia with sleep apnea 10/13/2015   Menorrhagia with regular cycle 09/19/2015   Left ovarian cyst 09/19/2015   Type 2 diabetes mellitus with obesity (HCC) 06/08/2015    PCP: Georgina Speaks, FNP  REFERRING PROVIDER:  Georgina Ozell LABOR, MD   REFERRING DIAG:  M54.50 (ICD-10-CM) - Lumbar pain    Rationale for Evaluation and Treatment: Rehabilitation  THERAPY DIAG:  Other low back pain  ONSET DATE: 06/29/2024 date of referral    SUBJECTIVE:                                                                                                                                                                                           SUBJECTIVE STATEMENT: 07/25/2024 Patient reports that she has been compliant with her HEP, every other day. Her symptoms have decreased from 7/10 to 6/10 severity.    EVAL: Patient presents to PT d/t constant left-sided low back pain, that has been present for at least 2 years without known MOI. She has tried OTC medications, and left SI joint injection. She has occasional shooting pain in L leg. At this time, she continues to feel limited with daily activities d/t the pain.    PERTINENT HISTORY:  Relevant PMHx includes DM, DUB, Fibroids, HTN, L ovarian cyst, migraine, OSA, HTN, reduction mammaplasty, L ventricular hypertrophy, Obesity  PAIN:  Are you having pain?  Yes: NPRS scale: 7/10 current/worst Pain location: L Left sided lumbar pain  Pain description: pressure  Aggravating factors: lifting, mopping, laying down with leg straight or on left  Relieving factors: laying with knees bent, heating pad   PRECAUTIONS: None  RED FLAGS: None   WEIGHT BEARING RESTRICTIONS: No  FALLS:  Has patient fallen in last 6 months? No  LIVING ENVIRONMENT: Lives with: lives with their spouse Lives in: House/apartment   OCCUPATION: mental health assessment -- desk job  PLOF: Independent  PATIENT GOALS: Patient would like to return to PLOF with decreased pain severity  NEXT MD VISIT: 07/23/24 with Dr. Glennon, followed by planned Total Hysterectomy [place] on 09/11/2024  OBJECTIVE:  Note: Objective measures were completed at Evaluation unless otherwise noted.  DIAGNOSTIC FINDINGS:   06/10/2024 Lumbar MRI  IMPRESSION: L5-S1 has mild degenerative disc disease and facet arthritis. No degenerative marrow edema.   This level also has mild bilateral degenerative lateral recess narrowing.    The imaged levels are otherwise  unremarkable.   05/24/2024 Lumbar Xray   XRs of the lumbar spine from 05/27/2024 was independently reviewed and  interpreted, showing disc height loss at L5/S1. No other significant  degenerative changes. No evidence of instability on flexion/extension  views. No fracture or dislocation seen.  PATIENT SURVEYS:   Modified Oswestry:   MODIFIED OSWESTRY DISABILITY SCALE  Date: 07/18/2024  Score  Pain intensity 1 = The pain is bad, but I can manage without having to take (1) I can stand as long as I want but, it increases my pain. pain medication.  2. Personal care (washing, dressing, etc.) 0 =  I can take care of myself normally without causing increased pain.  3. Lifting 4 = I can lift only very light weights  4. Walking 0 = Pain does not prevent me from walking any distance  5. Sitting 1 =  I can only sit in my favorite chair as long as I like.  6. Standing 0 =  I can stand as long as I want without increased pain.  7. Sleeping 0 = Pain does not prevent me from sleeping well.  8. Social Life 0 = My social life is normal and does not increase my pain.  9. Traveling 0 =  I can travel anywhere without increased pain.  10. Employment/ Homemaking 1 = My normal homemaking/job activities increase my pain, but I can still perform all that is required of me  Total 7/50   Interpretation of scores: Score Category Description  0-20% Minimal Disability The patient can cope with most living activities. Usually no treatment is indicated apart from advice on lifting, sitting and exercise  21-40% Moderate Disability The patient experiences more pain and difficulty with sitting, lifting and standing. Travel and social life are more difficult and they may be disabled from work. Personal care, sexual activity and sleeping are not grossly affected, and the patient can usually be managed by conservative means  41-60% Severe Disability Pain remains the main problem in this  group, but activities of daily living are affected. These patients require a detailed investigation  61-80% Crippled Back pain impinges on all aspects of the patient's life. Positive intervention is required  81-100% Bed-bound  These patients are either bed-bound or exaggerating their symptoms  Bluford FORBES Zoe DELENA Karon DELENA, et al. Surgery versus conservative management of stable thoracolumbar fracture: the PRESTO feasibility RCT. [place] ([place]): [employer]; 2021 Nov. [facility] Technology Assessment, No. 25.62.) Appendix 3, Oswestry Disability Index category descriptors. Available from: Find[employer]el[employer]cz  Minimally Clinically Important Difference (MCID) = 12.8%   COGNITION: Overall cognitive status: Within functional limits for tasks assessed     SENSATION: Not tested   LUMBAR ROM:   AROM eval  Flexion   Extension   Right lateral flexion   Left lateral flexion   Right rotation   Left rotation    (Blank rows = not tested)  LOWER EXTREMITY ROM:     Active  Right eval Left eval  Hip flexion    Hip extension    Hip abduction    Hip adduction    Hip internal rotation    Hip external rotation    Knee flexion    Knee extension    Ankle dorsiflexion    Ankle plantarflexion    Ankle inversion    Ankle eversion     (Blank rows = not tested)  LOWER EXTREMITY MMT:    MMT Right eval Left eval  Hip flexion    Hip  extension    Hip abduction 4+ 4  Hip adduction 4+ 4-  Hip internal rotation    Hip external rotation    Knee flexion    Knee extension    Ankle dorsiflexion    Ankle plantarflexion    Ankle inversion    Ankle eversion     (Blank rows = not tested)  LUMBAR SPECIAL TESTS:  Straight leg raise test: Negative, SI Compression/distraction test: Positive, and FABER test: Negative  FUNCTIONAL TESTS:  30 seconds chair stand test: Deferred for f/u visit as indicated.   TREATMENT DATE:    [facility] Adult PT Treatment:                                                 DATE: 07/25/2024  Therapeutic Exercise: LTR 2 x 8 each side, second round to right side only with overpressure from clinician  Anterior-to-posterior pelvic tilt, 2 x 10  S/L clamshells GTB 2 x 10 L only  SLR 2 x 10 L only  Updated and reviewed HEP   Neuromuscular re-ed: SIJ MET 2 x 10 L only  Glute bridge 2 x 10 with GTB   Therapeutic Activity: Nustep level 7 x 8 minutes at end of session      [facility] Adult PT Treatment:                                                DATE: 07/18/2024   Initial evaluation: see patient education and home exercise program as noted below                                                                                                                                   PATIENT EDUCATION:  Education details: reviewed initial home exercise program; discussion of POC, prognosis and goals for skilled PT   Person educated: Patient Education method: Explanation, Demonstration, and Handouts Education comprehension: verbalized understanding, returned demonstration, and needs further education  HOME EXERCISE PROGRAM: Access Code: [email or web address] URL: [email or web address] Date: 07/18/2024 Prepared by: Marko Molt  Exercises - Hooklying Sacroiliac Joint Isometric  - 1 x daily - 7 x weekly - 2 sets - 10 reps - 3-5 sec hold - Supine Bridge with Mini Swiss Ball Between Knees  - 1 x daily - 7 x weekly - 2 sets - 10 reps - 3 sec hold - Supine Active Straight Leg Raise  - 1 x daily - 7 x weekly - 2 sets - 10 reps  ASSESSMENT:  CLINICAL IMPRESSION:  07/25/2024 Allissa had good tolerance of initial treatment session. Tactile and Verbal cues required for instruction. She denies any change of symptoms severity at end of  session. Patient requires ongoing skilled PT intervention to address current impairments and related functional deficits. We will continue to progress as tolerated.    EVAL: Skylee is a 53 y.o. female   who was seen today for physical therapy evaluation and treatment for persistent Low Back Pain with radiating pain to L LE. There is a possibility of contribution of symptoms from presence of uterine fibroids, which are symptomatic  with planned hysterectomy on 09/11/2024.She is demonstrating decreased L hip MMT scores, positive SIJ compression/distraction with presence of concordant symptoms.  She has related pain and difficulty with heavy lifting, moderate-to-heavy household activities, and laying down with left LE straight. She requires skilled PT services at this time to address relevant deficits and improve overall function.     OBJECTIVE IMPAIRMENTS: decreased activity tolerance, decreased strength, and pain.   ACTIVITY LIMITATIONS: carrying, lifting, bending, sleeping, and stairs  PARTICIPATION LIMITATIONS: meal prep, cleaning, laundry, interpersonal relationship, shopping, and community activity  PERSONAL FACTORS: Time since onset of injury/illness/exacerbation and 3+ comorbidities: Relevant PMHx includes DM, DUB, Fibroids, HTN, L ovarian cyst, migraine, OSA, HTN, reduction mammaplasty, L ventricular hypertrophy, Obesity are also affecting patient's functional outcome.   REHAB POTENTIAL: Fair    CLINICAL DECISION MAKING: Evolving/moderate complexity  EVALUATION COMPLEXITY: Moderate   GOALS: Goals reviewed with patient? YES  SHORT TERM GOALS: Target date: 08/15/2024    Patient will be independent with initial home program at least 3 days/week.  Baseline: provided at eval Goal Status: INITIAL   2.  Patient will demonstrate improved Left hip MMT to at least 4+/5 Baseline: see objective measures Goal Status: INITIAL     LONG TERM GOALS: Target date: 09/10/2024    Patient will report improved overall functional ability with modified ODI score of 3/50 or less.  Baseline: 7/50 Goal Status: INITIAL    2.  Patient will demonstrate ability to perform floor to waist lifting of  at least 25# using appropriate body mechanics and with no more than minimal pain in order to safely perform normal daily/occupational tasks.   Baseline: limited to only light weights  Goal Status: INITIAL    3.  Patient will demonstrate ability to perform pushing/pulling activities with at least 25# using appropriate body mechanics and with no more than minimal pain in order to improve tolerance of vacuuming and mopping tasks  Baseline: increases symptoms  Goal status: INITIAL  4.  Patient will report decreased pain severity from 7/10 to 4/10 or less with ADLs/IADLs Goal status: INITIAL    PLAN:  PT FREQUENCY: 1-2x/week  PT DURATION: 8 weeks  PLANNED INTERVENTIONS: 97164- PT Re-evaluation, 97750- Physical Performance Testing, 97110-Therapeutic exercises, 97530- Therapeutic activity, 97112- Neuromuscular re-education, 97535- Self Care, [id number]- Manual therapy, [id number]- Aquatic Therapy, [id number]- Electrical stimulation (unattended), [id number]- Electrical stimulation (manual), [id number]- Traction (mechanical), 20560 (1-2 muscles), 20561 (3+ muscles)- Dry Needling, Patient/Family education, Taping, Joint mobilization, Spinal mobilization, Cryotherapy, and Moist heat.  PLAN FOR NEXT SESSION: continue SIJ MET, LE strengthening, dynamic core strengthening program; patient education, modalities and manual therapy as indicated for pain modulation and management    Marko Molt, PT, DPT  07/25/2024 9:10 AM

## 2024-07-27 ENCOUNTER — Ambulatory Visit: Payer: Self-pay | Admitting: Obstetrics and Gynecology

## 2024-07-27 LAB — SURGICAL PATHOLOGY

## 2024-08-01 ENCOUNTER — Ambulatory Visit: Attending: Orthopedic Surgery

## 2024-08-01 DIAGNOSIS — M5459 Other low back pain: Secondary | ICD-10-CM | POA: Insufficient documentation

## 2024-08-01 NOTE — Therapy (Signed)
 OUTPATIENT PHYSICAL THERAPY EVALUATION   Patient Name: Melanie Little MRN: [id number] DOB:07-Jan-1971, 53 y.o., female Today's Date: 08/01/2024  END OF SESSION:         Past Medical History:  Diagnosis Date   Abnormal EKG    Abnormal Pap smear of cervix about 2013, 2016   Colpo biopsy with normal result per pt.  colpo 2016 HPV changes   Diabetes mellitus without complication (HCC)    DUB (dysfunctional uterine bleeding)    around 2012, saw gyn at the time for eval   Elevated hemoglobin A1c    Fibroid    multiple fibroids   Hypertension    Left ovarian cyst 05/2018   do yearly ultrasound.  Doing GYN ONC consulation for monitoring.    Migraine    OSA on CPAP    Pituitary adenoma (HCC) 02/09/2023   MIR - 8 x 5 mm   Pure hypercholesterolemia 02/26/2023   Resistant hypertension 05/02/2021   Sleep apnea    Past Surgical History:  Procedure Laterality Date   BREAST REDUCTION SURGERY  2005   CHOLECYSTECTOMY  2004   EYE SURGERY     INCISION AND DRAINAGE Bilateral 2015   Bilateral axillary region ebaceous cyst   IR RADIOLOGIST EVAL & MGMT  09/14/2022   IR RADIOLOGIST EVAL & MGMT  11/08/2022   MASS EXCISION N/A 12/02/2020   Procedure: EXCISION OF TWO MIDLINE BACK SUBCUTANEOUS MASSES;  Surgeon: Lyndel Deward PARAS, MD;  Location: [facility];  Service: General;  Laterality: N/A;   REDUCTION MAMMAPLASTY Bilateral 2005   Patient Active Problem List   Diagnosis Date Noted   Iron  deficiency anemia 06/25/2024   Closed comedone 06/24/2024   Herpes zoster vaccination declined 02/17/2024   Encounter for annual health examination 10/27/2023   Influenza vaccination declined 10/27/2023   Need for zoster vaccination 10/27/2023   COVID-19 vaccination declined 10/27/2023   Boil 10/27/2023   Decreased energy 10/27/2023   Chronic left-sided low back pain without sciatica 06/17/2023   OSA on CPAP 03/14/2023   Primary hypertension 02/26/2023   Pure  hypercholesterolemia 02/26/2023   Prolactin increased 02/26/2023   Pituitary microadenoma (HCC) 11/20/2022   Intractable chronic migraine without aura and with status migrainosus 11/20/2022   Amenorrhea 11/20/2022   Encounter for birth control pills maintenance 05/02/2021   Resistant hypertension 05/02/2021   LVH (left ventricular hypertrophy) 01/09/2021   Nonspecific abnormal electrocardiogram (ECG) (EKG) 12/16/2020   Headache 03/12/2018   Numbness and tingling of left upper extremity 03/12/2018   Elevated CA-125 10/21/2017   Recurrent boils 07/30/2017   OSA (obstructive sleep apnea) 04/30/2017   Chronic hypokalemia 04/30/2017   Obese 04/30/2017   Fibroid, uterine 08/19/2016   History of abnormal cervical Pap smear 08/19/2016   Snoring 10/13/2015   Hypersomnia with sleep apnea 10/13/2015   Menorrhagia with regular cycle 09/19/2015   Left ovarian cyst 09/19/2015   Type 2 diabetes mellitus with obesity (HCC) 06/08/2015    PCP: Georgina Speaks, FNP  REFERRING PROVIDER:  Georgina Ozell LABOR, MD   REFERRING DIAG:  M54.50 (ICD-10-CM) - Lumbar pain    Rationale for Evaluation and Treatment: Rehabilitation  THERAPY DIAG:  No diagnosis found.  ONSET DATE: 06/29/2024 date of referral   SUBJECTIVE:  SUBJECTIVE STATEMENT: 08/01/2024 Patient reports that she has been compliant with her HEP, every other day. Her symptoms have decreased from 7/10 to 6/10 severity.    EVAL: Patient presents to PT d/t constant left-sided low back pain, that has been present for at least 2 years without known MOI. She has tried OTC medications, and left SI joint injection. She has occasional shooting pain in L leg. At this time, she continues to feel limited with daily activities d/t the pain.    PERTINENT HISTORY:  Relevant  PMHx includes DM, DUB, Fibroids, HTN, L ovarian cyst, migraine, OSA, HTN, reduction mammaplasty, L ventricular hypertrophy, Obesity  PAIN:  Are you having pain?  Yes: NPRS scale: 7/10 current/worst Pain location: L Left sided lumbar pain  Pain description: pressure  Aggravating factors: lifting, mopping, laying down with leg straight or on left  Relieving factors: laying with knees bent, heating pad   PRECAUTIONS: None  RED FLAGS: None   WEIGHT BEARING RESTRICTIONS: No  FALLS:  Has patient fallen in last 6 months? No  LIVING ENVIRONMENT: Lives with: lives with their spouse Lives in: House/apartment   OCCUPATION: mental health assessment -- desk job  PLOF: Independent  PATIENT GOALS: Patient would like to return to PLOF with decreased pain severity  NEXT MD VISIT: 07/23/24 with Dr. Glennon, followed by planned Total Hysterectomy Proceduce on 09/11/2024  OBJECTIVE:  Note: Objective measures were completed at Evaluation unless otherwise noted.  DIAGNOSTIC FINDINGS:   06/10/2024 Lumbar MRI  IMPRESSION: L5-S1 has mild degenerative disc disease and facet arthritis. No degenerative marrow edema.   This level also has mild bilateral degenerative lateral recess narrowing.   The imaged levels are otherwise unremarkable.   05/24/2024 Lumbar Xray   XRs of the lumbar spine from 05/27/2024 was independently reviewed and  interpreted, showing disc height loss at L5/S1. No other significant  degenerative changes. No evidence of instability on flexion/extension  views. No fracture or dislocation seen.  PATIENT SURVEYS:   Modified Oswestry:   MODIFIED OSWESTRY DISABILITY SCALE  Date: 07/18/2024  Score  Pain intensity 1 = The pain is bad, but I can manage without having to take (1) I can stand as long as I want but, it increases my pain. pain medication.  2. Personal care (washing, dressing, etc.) 0 =  I can take care of myself normally without causing increased pain.  3.  Lifting 4 = I can lift only very light weights  4. Walking 0 = Pain does not prevent me from walking any distance  5. Sitting 1 =  I can only sit in my favorite chair as long as I like.  6. Standing 0 =  I can stand as long as I want without increased pain.  7. Sleeping 0 = Pain does not prevent me from sleeping well.  8. Social Life 0 = My social life is normal and does not increase my pain.  9. Traveling 0 =  I can travel anywhere without increased pain.  10. Employment/ Homemaking 1 = My normal homemaking/job activities increase my pain, but I can still perform all that is required of me  Total 7/50   Interpretation of scores: Score Category Description  0-20% Minimal Disability The patient can cope with most living activities. Usually no treatment is indicated apart from advice on lifting, sitting and exercise  21-40% Moderate Disability The patient experiences more pain and difficulty with sitting, lifting and standing. Travel and social life are more difficult and they may be  disabled from work. Personal care, sexual activity and sleeping are not grossly affected, and the patient can usually be managed by conservative means  41-60% Severe Disability Pain remains the main problem in this group, but activities of daily living are affected. These patients require a detailed investigation  61-80% Crippled Back pain impinges on all aspects of the patient's life. Positive intervention is required  81-100% Bed-bound  These patients are either bed-bound or exaggerating their symptoms  Bluford FORBES Zoe DELENA Karon DELENA, et al. Surgery versus conservative management of stable thoracolumbar fracture: the PRESTO feasibility RCT. [place] ([place]): [employer]; 2021 Nov. [facility] Technology Assessment, No. 25.62.) Appendix 3, Oswestry Disability Index category descriptors. Available from: FindJewel[employer].cz  Minimally Clinically Important Difference (MCID) =  12.8%   COGNITION: Overall cognitive status: Within functional limits for tasks assessed     SENSATION: Not tested   LUMBAR ROM:   AROM eval  Flexion   Extension   Right lateral flexion   Left lateral flexion   Right rotation   Left rotation    (Blank rows = not tested)  LOWER EXTREMITY ROM:     Active  Right eval Left eval  Hip flexion    Hip extension    Hip abduction    Hip adduction    Hip internal rotation    Hip external rotation    Knee flexion    Knee extension    Ankle dorsiflexion    Ankle plantarflexion    Ankle inversion    Ankle eversion     (Blank rows = not tested)  LOWER EXTREMITY MMT:    MMT Right eval Left eval  Hip flexion    Hip extension    Hip abduction 4+ 4  Hip adduction 4+ 4-  Hip internal rotation    Hip external rotation    Knee flexion    Knee extension    Ankle dorsiflexion    Ankle plantarflexion    Ankle inversion    Ankle eversion     (Blank rows = not tested)  LUMBAR SPECIAL TESTS:  Straight leg raise test: Negative, SI Compression/distraction test: Positive, and FABER test: Negative  FUNCTIONAL TESTS:  30 seconds chair stand test: Deferred for f/u visit as indicated.   TREATMENT DATE:    [facility] Adult PT Treatment:                                                DATE: 08/01/2024  Therapeutic Exercise: LTR 2 x 8 each side, second round to right side only with overpressure from clinician  Anterior-to-posterior pelvic tilt, 2 x 10  S/L clamshells GTB 2 x 10 L only  SLR 2 x 10 L only  Prone on elbows  Updated and reviewed HEP   Neuromuscular re-ed: SIJ MET 2 x 10 L only  Glute bridge 2 x 10 with GTB   Therapeutic Activity: Nustep level 7 x 8 minutes at end of session    [facility] Adult PT Treatment:                                                DATE: 07/25/2024  Therapeutic Exercise: LTR 2 x 8 each side, second round to right side only  with overpressure from clinician  Anterior-to-posterior pelvic tilt, 2 x  10  S/L clamshells GTB 2 x 10 L only  SLR 2 x 10 L only  Updated and reviewed HEP   Neuromuscular re-ed: SIJ MET 2 x 10 L only  Glute bridge 2 x 10 with GTB   Therapeutic Activity: Nustep level 7 x 8 minutes at end of session      [facility] Adult PT Treatment:                                                DATE: 07/18/2024   Initial evaluation: see patient education and home exercise program as noted below                                                                                                                                   PATIENT EDUCATION:  Education details: reviewed initial home exercise program; discussion of POC, prognosis and goals for skilled PT   Person educated: Patient Education method: Explanation, Demonstration, and Handouts Education comprehension: verbalized understanding, returned demonstration, and needs further education  HOME EXERCISE PROGRAM: Access Code: [email or web address] URL: [email or web address] Date: 07/18/2024 Prepared by: Marko Molt  Exercises - Hooklying Sacroiliac Joint Isometric  - 1 x daily - 7 x weekly - 2 sets - 10 reps - 3-5 sec hold - Supine Bridge with Mini Swiss Ball Between Knees  - 1 x daily - 7 x weekly - 2 sets - 10 reps - 3 sec hold - Supine Active Straight Leg Raise  - 1 x daily - 7 x weekly - 2 sets - 10 reps  ASSESSMENT:  CLINICAL IMPRESSION:  08/01/2024 Melanie Little had good tolerance of initial treatment session. Tactile and Verbal cues required for instruction. She denies any change of symptoms severity at end of session. Patient requires ongoing skilled PT intervention to address current impairments and related functional deficits. We will continue to progress as tolerated.    EVAL: Melanie Little is a 53 y.o. female  who was seen today for physical therapy evaluation and treatment for persistent Low Back Pain with radiating pain to L LE. There is a possibility of contribution of symptoms from presence of uterine  fibroids, which are symptomatic  with planned hysterectomy on 09/11/2024.She is demonstrating decreased L hip MMT scores, positive SIJ compression/distraction with presence of concordant symptoms.  She has related pain and difficulty with heavy lifting, moderate-to-heavy household activities, and laying down with left LE straight. She requires skilled PT services at this time to address relevant deficits and improve overall function.     OBJECTIVE IMPAIRMENTS: decreased activity tolerance, decreased strength, and pain.   ACTIVITY LIMITATIONS: carrying, lifting, bending, sleeping, and stairs  PARTICIPATION LIMITATIONS: meal prep, cleaning, laundry, interpersonal relationship, shopping, and community activity  PERSONAL FACTORS: Time since onset  of injury/illness/exacerbation and 3+ comorbidities: Relevant PMHx includes DM, DUB, Fibroids, HTN, L ovarian cyst, migraine, OSA, HTN, reduction mammaplasty, L ventricular hypertrophy, Obesity are also affecting patient's functional outcome.   REHAB POTENTIAL: Fair    CLINICAL DECISION MAKING: Evolving/moderate complexity  EVALUATION COMPLEXITY: Moderate   GOALS: Goals reviewed with patient? YES  SHORT TERM GOALS: Target date: 08/15/2024    Patient will be independent with initial home program at least 3 days/week.  Baseline: provided at eval Goal Status: INITIAL   2.  Patient will demonstrate improved Left hip MMT to at least 4+/5 Baseline: see objective measures Goal Status: INITIAL     LONG TERM GOALS: Target date: 09/10/2024    Patient will report improved overall functional ability with modified ODI score of 3/50 or less.  Baseline: 7/50 Goal Status: INITIAL    2.  Patient will demonstrate ability to perform floor to waist lifting of at least 25# using appropriate body mechanics and with no more than minimal pain in order to safely perform normal daily/occupational tasks.   Baseline: limited to only light weights  Goal Status:  INITIAL    3.  Patient will demonstrate ability to perform pushing/pulling activities with at least 25# using appropriate body mechanics and with no more than minimal pain in order to improve tolerance of vacuuming and mopping tasks  Baseline: increases symptoms  Goal status: INITIAL  4.  Patient will report decreased pain severity from 7/10 to 4/10 or less with ADLs/IADLs Goal status: INITIAL    PLAN:  PT FREQUENCY: 1-2x/week  PT DURATION: 8 weeks  PLANNED INTERVENTIONS: 97164- PT Re-evaluation, 97750- Physical Performance Testing, 97110-Therapeutic exercises, 97530- Therapeutic activity, 97112- Neuromuscular re-education, 97535- Self Care, [id number]- Manual therapy, [id number]- Aquatic Therapy, [id number]- Electrical stimulation (unattended), [id number]- Electrical stimulation (manual), [id number]- Traction (mechanical), 20560 (1-2 muscles), 20561 (3+ muscles)- Dry Needling, Patient/Family education, Taping, Joint mobilization, Spinal mobilization, Cryotherapy, and Moist heat.  PLAN FOR NEXT SESSION: continue SIJ MET, LE strengthening, dynamic core strengthening program; patient education, modalities and manual therapy as indicated for pain modulation and management    Marko Molt, PT, DPT  08/01/2024 10:37 AM

## 2024-08-04 ENCOUNTER — Ambulatory Visit

## 2024-08-04 DIAGNOSIS — M5459 Other low back pain: Secondary | ICD-10-CM | POA: Diagnosis not present

## 2024-08-04 NOTE — Therapy (Signed)
 OUTPATIENT PHYSICAL THERAPY NOTE   Patient Name: DEYNA CARBON MRN: [id number] DOB:1971-01-17, 53 y.o., female Today's Date: 08/04/2024  END OF SESSION:            Past Medical History:  Diagnosis Date   Abnormal EKG    Abnormal Pap smear of cervix about 2013, 2016   Colpo biopsy with normal result per pt.  colpo 2016 HPV changes   Diabetes mellitus without complication (HCC)    DUB (dysfunctional uterine bleeding)    around 2012, saw gyn at the time for eval   Elevated hemoglobin A1c    Fibroid    multiple fibroids   Hypertension    Left ovarian cyst 05/2018   do yearly ultrasound.  Doing GYN ONC consulation for monitoring.    Migraine    OSA on CPAP    Pituitary adenoma (HCC) 02/09/2023   MIR - 8 x 5 mm   Pure hypercholesterolemia 02/26/2023   Resistant hypertension 05/02/2021   Sleep apnea    Past Surgical History:  Procedure Laterality Date   BREAST REDUCTION SURGERY  2005   CHOLECYSTECTOMY  2004   EYE SURGERY     INCISION AND DRAINAGE Bilateral 2015   Bilateral axillary region ebaceous cyst   IR RADIOLOGIST EVAL & MGMT  09/14/2022   IR RADIOLOGIST EVAL & MGMT  11/08/2022   MASS EXCISION N/A 12/02/2020   Procedure: EXCISION OF TWO MIDLINE BACK SUBCUTANEOUS MASSES;  Surgeon: Lyndel Deward PARAS, MD;  Location:  [facility];  Service: General;  Laterality: N/A;   REDUCTION MAMMAPLASTY Bilateral 2005   Patient Active Problem List   Diagnosis Date Noted   Iron  deficiency anemia 06/25/2024   Closed comedone 06/24/2024   Herpes zoster vaccination declined 02/17/2024   Encounter for annual health examination 10/27/2023   Influenza vaccination declined 10/27/2023   Need for zoster vaccination 10/27/2023   COVID-19 vaccination declined 10/27/2023   Boil 10/27/2023   Decreased energy 10/27/2023   Chronic left-sided low back pain without sciatica 06/17/2023   OSA on CPAP 03/14/2023   Primary hypertension 02/26/2023   Pure  hypercholesterolemia 02/26/2023   Prolactin increased 02/26/2023   Pituitary microadenoma (HCC) 11/20/2022   Intractable chronic migraine without aura and with status migrainosus 11/20/2022   Amenorrhea 11/20/2022   Encounter for birth control pills maintenance 05/02/2021   Resistant hypertension 05/02/2021   LVH (left ventricular hypertrophy) 01/09/2021   Nonspecific abnormal electrocardiogram (ECG) (EKG) 12/16/2020   Headache 03/12/2018   Numbness and tingling of left upper extremity 03/12/2018   Elevated CA-125 10/21/2017   Recurrent boils 07/30/2017   OSA (obstructive sleep apnea) 04/30/2017   Chronic hypokalemia 04/30/2017   Obese 04/30/2017   Fibroid, uterine 08/19/2016   History of abnormal cervical Pap smear 08/19/2016   Snoring 10/13/2015   Hypersomnia with sleep apnea 10/13/2015   Menorrhagia with regular cycle 09/19/2015   Left ovarian cyst 09/19/2015   Type 2 diabetes mellitus with obesity (HCC) 06/08/2015    PCP: Georgina Speaks, FNP  REFERRING PROVIDER:  Georgina Ozell LABOR, MD   REFERRING DIAG:  M54.50 (ICD-10-CM) - Lumbar pain    Rationale for Evaluation and Treatment: Rehabilitation  THERAPY DIAG:  Other low back pain  ONSET DATE: 06/29/2024 date of referral   SUBJECTIVE:  SUBJECTIVE STATEMENT: 08/04/2024 Patient reports that she has been compliant with her HEP, every other day. Her symptoms have decreased from 7/10 to 6/10 severity.    EVAL: Patient presents to PT d/t constant left-sided low back pain, that has been present for at least 2 years without known MOI. She has tried OTC medications, and left SI joint injection. She has occasional shooting pain in L leg. At this time, she continues to feel limited with daily activities d/t the pain.    PERTINENT HISTORY:  Relevant  PMHx includes DM, DUB, Fibroids, HTN, L ovarian cyst, migraine, OSA, HTN, reduction mammaplasty, L ventricular hypertrophy, Obesity  PAIN:  Are you having pain?  Yes: NPRS scale: 7/10 current/worst Pain location: L Left sided lumbar pain  Pain description: pressure  Aggravating factors: lifting, mopping, laying down with leg straight or on left  Relieving factors: laying with knees bent, heating pad   PRECAUTIONS: None  RED FLAGS: None   WEIGHT BEARING RESTRICTIONS: No  FALLS:  Has patient fallen in last 6 months? No  LIVING ENVIRONMENT: Lives with: lives with their spouse Lives in: House/apartment   OCCUPATION: mental health assessment -- desk job  PLOF: Independent  PATIENT GOALS: Patient would like to return to PLOF with decreased pain severity  NEXT MD VISIT: 07/23/24 with Dr. Glennon, followed by planned Total Hysterectomy [place] on 09/11/2024  OBJECTIVE:  Note: Objective measures were completed at Evaluation unless otherwise noted.  DIAGNOSTIC FINDINGS:   06/10/2024 Lumbar MRI  IMPRESSION: L5-S1 has mild degenerative disc disease and facet arthritis. No degenerative marrow edema.   This level also has mild bilateral degenerative lateral recess narrowing.   The imaged levels are otherwise unremarkable.   05/24/2024 Lumbar Xray   XRs of the lumbar spine from 05/27/2024 was independently reviewed and  interpreted, showing disc height loss at L5/S1. No other significant  degenerative changes. No evidence of instability on flexion/extension  views. No fracture or dislocation seen.  PATIENT SURVEYS:   Modified Oswestry:   MODIFIED OSWESTRY DISABILITY SCALE  Date: 07/18/2024  Score  Pain intensity 1 = The pain is bad, but I can manage without having to take (1) I can stand as long as I want but, it increases my pain. pain medication.  2. Personal care (washing, dressing, etc.) 0 =  I can take care of myself normally without causing increased pain.  3.  Lifting 4 = I can lift only very light weights  4. Walking 0 = Pain does not prevent me from walking any distance  5. Sitting 1 =  I can only sit in my favorite chair as long as I like.  6. Standing 0 =  I can stand as long as I want without increased pain.  7. Sleeping 0 = Pain does not prevent me from sleeping well.  8. Social Life 0 = My social life is normal and does not increase my pain.  9. Traveling 0 =  I can travel anywhere without increased pain.  10. Employment/ Homemaking 1 = My normal homemaking/job activities increase my pain, but I can still perform all that is required of me  Total 7/50   Interpretation of scores: Score Category Description  0-20% Minimal Disability The patient can cope with most living activities. Usually no treatment is indicated apart from advice on lifting, sitting and exercise  21-40% Moderate Disability The patient experiences more pain and difficulty with sitting, lifting and standing. Travel and social life are more difficult and they may be  disabled from work. Personal care, sexual activity and sleeping are not grossly affected, and the patient can usually be managed by conservative means  41-60% Severe Disability Pain remains the main problem in this group, but activities of daily living are affected. These patients require a detailed investigation  61-80% Crippled Back pain impinges on all aspects of the patient's life. Positive intervention is required  81-100% Bed-bound  These patients are either bed-bound or exaggerating their symptoms  Bluford FORBES Zoe DELENA Karon DELENA, et al. Surgery versus conservative management of stable thoracolumbar fracture: the PRESTO feasibility RCT. [place] ([place]): [employer]; 2021 Nov. [facility] Technology Assessment, No. 25.62.) Appendix 3, Oswestry Disability Index category descriptors. Available from: FindJewel[employer].cz  Minimally Clinically Important Difference (MCID) =  12.8%   COGNITION: Overall cognitive status: Within functional limits for tasks assessed     SENSATION: Not tested   LUMBAR ROM:   AROM eval  Flexion   Extension   Right lateral flexion   Left lateral flexion   Right rotation   Left rotation    (Blank rows = not tested)  LOWER EXTREMITY ROM:     Active  Right eval Left eval  Hip flexion    Hip extension    Hip abduction    Hip adduction    Hip internal rotation    Hip external rotation    Knee flexion    Knee extension    Ankle dorsiflexion    Ankle plantarflexion    Ankle inversion    Ankle eversion     (Blank rows = not tested)  LOWER EXTREMITY MMT:    MMT Right eval Left eval  Hip flexion    Hip extension    Hip abduction 4+ 4  Hip adduction 4+ 4-  Hip internal rotation    Hip external rotation    Knee flexion    Knee extension    Ankle dorsiflexion    Ankle plantarflexion    Ankle inversion    Ankle eversion     (Blank rows = not tested)  LUMBAR SPECIAL TESTS:  Straight leg raise test: Negative, SI Compression/distraction test: Positive, and FABER test: Negative  FUNCTIONAL TESTS:  30 seconds chair stand test: Deferred for f/u visit as indicated.   TREATMENT DATE:   [facility] Adult PT Treatment:                                                DATE: 08/04/2024  Therapeutic Exercise: Recumbent Bike x 5 minute level 1-2  Isometric LTR 2 x 15 each side SLR 2 x 8 L only   Neuromuscular re-ed: Glute Bridge x 10  Glute Bridge x 20 with gait belt around legs  STS 2 x 10 with GTB Prone knee bend BIL x 10, 2 x 10 each unilateral, each side  Manual Therapy Grade 2 mobilization from L3-L5 CPA Grade 2 right to left mob at L4-L5 UPA  [facility] Adult PT Treatment:                                                DATE: 08/01/2024  Therapeutic Exercise: LTR 2 x 8 each side, second round to right side only with overpressure from clinician  Anterior-to-posterior pelvic  tilt, 2 x 10  SLR 2 x 10 L only   Updated and reviewed HEP   Neuromuscular re-ed: SIJ MET 2 x 10 L only  PPT Glute bridge 2 x 10 with GTB  STS 2 x 10 with GTB Prone knee bend, 2 x 10 each side, manual and verbal cueing to minimize lumbopelvic movement     [facility] Adult PT Treatment:                                                DATE: 07/25/2024  Therapeutic Exercise: LTR 2 x 8 each side, second round to right side only with overpressure from clinician  Anterior-to-posterior pelvic tilt, 2 x 10  S/L clamshells GTB 2 x 10 L only  SLR 2 x 10 L only  Updated and reviewed HEP   Neuromuscular re-ed: SIJ MET 2 x 10 L only  Glute bridge 2 x 10 with GTB   Therapeutic Activity: Nustep level 7 x 8 minutes at end of session      [facility] Adult PT Treatment:                                                DATE: 07/18/2024   Initial evaluation: see patient education and home exercise program as noted below                                                                                                                                   PATIENT EDUCATION:  Education details: reviewed initial home exercise program; discussion of POC, prognosis and goals for skilled PT   Person educated: Patient Education method: Explanation, Demonstration, and Handouts Education comprehension: verbalized understanding, returned demonstration, and needs further education  HOME EXERCISE PROGRAM: Access Code: [email or web address] URL: [email or web address] Date: 07/18/2024 Prepared by: Marko Molt  Exercises - Hooklying Sacroiliac Joint Isometric  - 1 x daily - 7 x weekly - 2 sets - 10 reps - 3-5 sec hold - Supine Bridge with Mini Swiss Ball Between Knees  - 1 x daily - 7 x weekly - 2 sets - 10 reps - 3 sec hold - Supine Active Straight Leg Raise  - 1 x daily - 7 x weekly - 2 sets - 10 reps  ASSESSMENT:  CLINICAL IMPRESSION:  08/04/2024 Rolene had improved tolerance of today's treatment session, with addition of lumbar mobilization  at end of session. She reports decreased pain to 4-5/10 pain following this treatment. Plan at this time is to perform manual therapy at next session, following by active interventions to maximize duration of relief.    EVAL: Sophiagrace is a 53 y.o. female  who was seen today  for physical therapy evaluation and treatment for persistent Low Back Pain with radiating pain to L LE. There is a possibility of contribution of symptoms from presence of uterine fibroids, which are symptomatic  with planned hysterectomy on 09/11/2024.She is demonstrating decreased L hip MMT scores, positive SIJ compression/distraction with presence of concordant symptoms.  She has related pain and difficulty with heavy lifting, moderate-to-heavy household activities, and laying down with left LE straight. She requires skilled PT services at this time to address relevant deficits and improve overall function.     OBJECTIVE IMPAIRMENTS: decreased activity tolerance, decreased strength, and pain.   ACTIVITY LIMITATIONS: carrying, lifting, bending, sleeping, and stairs  PARTICIPATION LIMITATIONS: meal prep, cleaning, laundry, interpersonal relationship, shopping, and community activity  PERSONAL FACTORS: Time since onset of injury/illness/exacerbation and 3+ comorbidities: Relevant PMHx includes DM, DUB, Fibroids, HTN, L ovarian cyst, migraine, OSA, HTN, reduction mammaplasty, L ventricular hypertrophy, Obesity are also affecting patient's functional outcome.   REHAB POTENTIAL: Fair    CLINICAL DECISION MAKING: Evolving/moderate complexity  EVALUATION COMPLEXITY: Moderate   GOALS: Goals reviewed with patient? YES  SHORT TERM GOALS: Target date: 08/15/2024    Patient will be independent with initial home program at least 3 days/week.  Baseline: provided at eval Goal Status: INITIAL   2.  Patient will demonstrate improved Left hip MMT to at least 4+/5 Baseline: see objective measures Goal Status: INITIAL     LONG  TERM GOALS: Target date: 09/10/2024    Patient will report improved overall functional ability with modified ODI score of 3/50 or less.  Baseline: 7/50 Goal Status: INITIAL    2.  Patient will demonstrate ability to perform floor to waist lifting of at least 25# using appropriate body mechanics and with no more than minimal pain in order to safely perform normal daily/occupational tasks.   Baseline: limited to only light weights  Goal Status: INITIAL    3.  Patient will demonstrate ability to perform pushing/pulling activities with at least 25# using appropriate body mechanics and with no more than minimal pain in order to improve tolerance of vacuuming and mopping tasks  Baseline: increases symptoms  Goal status: INITIAL  4.  Patient will report decreased pain severity from 7/10 to 4/10 or less with ADLs/IADLs Goal status: INITIAL    PLAN:  PT FREQUENCY: 1-2x/week  PT DURATION: 8 weeks  PLANNED INTERVENTIONS: 97164- PT Re-evaluation, 97750- Physical Performance Testing, 97110-Therapeutic exercises, 97530- Therapeutic activity, 97112- Neuromuscular re-education, 97535- Self Care, 02859- Manual therapy, [id number]- Aquatic Therapy, H9716- Electrical stimulation (unattended), [id number]- Electrical stimulation (manual), [id number]- Traction (mechanical), 20560 (1-2 muscles), 20561 (3+ muscles)- Dry Needling, Patient/Family education, Taping, Joint mobilization, Spinal mobilization, Cryotherapy, and Moist heat.  PLAN FOR NEXT SESSION: continue SIJ MET, LE strengthening, dynamic core strengthening program; patient education, modalities and manual therapy as indicated for pain modulation and management    Marko Molt, PT, DPT  08/06/2024 8:32 AM

## 2024-08-05 DIAGNOSIS — Z0289 Encounter for other administrative examinations: Secondary | ICD-10-CM

## 2024-08-05 NOTE — Telephone Encounter (Signed)
 Left message to call Noreene Larsson, RN at [facility], [phone], option 5.

## 2024-08-08 ENCOUNTER — Ambulatory Visit

## 2024-08-15 ENCOUNTER — Encounter

## 2024-08-18 ENCOUNTER — Ambulatory Visit (INDEPENDENT_AMBULATORY_CARE_PROVIDER_SITE_OTHER): Admitting: Obstetrics and Gynecology

## 2024-08-18 VITALS — BP 122/80 | HR 77 | Ht 63.78 in | Wt 171.2 lb

## 2024-08-18 DIAGNOSIS — N809 Endometriosis, unspecified: Secondary | ICD-10-CM

## 2024-08-18 DIAGNOSIS — D219 Benign neoplasm of connective and other soft tissue, unspecified: Secondary | ICD-10-CM | POA: Diagnosis not present

## 2024-08-18 DIAGNOSIS — D500 Iron deficiency anemia secondary to blood loss (chronic): Secondary | ICD-10-CM | POA: Diagnosis not present

## 2024-08-18 DIAGNOSIS — N83201 Unspecified ovarian cyst, right side: Secondary | ICD-10-CM

## 2024-08-18 MED ORDER — IBUPROFEN 800 MG PO TABS: 800.0000 mg | ORAL_TABLET | Freq: Three times a day (TID) | ORAL | 1 refills | Status: AC | PRN

## 2024-08-18 MED ORDER — OXYCODONE HCL 5 MG PO TABS: 5.0000 mg | ORAL_TABLET | ORAL | 0 refills | Status: DC | PRN

## 2024-08-18 MED ORDER — ESTRADIOL 0.1 MG/GM VA CREA: TOPICAL_CREAM | VAGINAL | 12 refills | Status: AC

## 2024-08-18 MED ORDER — METOCLOPRAMIDE HCL 10 MG PO TABS: 10.0000 mg | ORAL_TABLET | Freq: Three times a day (TID) | ORAL | 0 refills | Status: DC | PRN

## 2024-08-18 NOTE — Patient Instructions (Signed)
 Robotic Laparoscopic Hysterectomy, Care After  The following information offers guidance on how to care for yourself after your procedure. Your health care provider may also give you more specific instructions. If you have problems or questions, contact your health care provider. What can I expect after the procedure? After the procedure, it is common to have: Pain, bruising, and numbness around your incisions. Tiredness (fatigue).  Abdominal bloating Poor appetite. Chest discomfort that radiates to your shoulder from the carbon dioxide gas for a few days after  Vaginal discharge or spotting. You will need to use a sanitary pad after this procedure.  HEAVY BLEEDING LIKE A PERIOD IS NOT NORMAL.  PLEASE CALL YOUR PROVIDER IF SOAKING A PAD or have copious discharge and or pain.  Feelings of sadness or other emotions.  If your ovaries were also removed, it is also common to have symptoms of menopause, such as hot flashes, night sweats, and lack of sleep (insomnia).  Ovaries should stay in if at all possible until at least the age of 62. Follow these instructions at home: Medicines Take over-the-counter and prescription medicines only as told by your health care provider. Ask your health care provider if the medicine prescribed to you: Requires you to avoid driving or using machinery. You cannot drive for 24 hours after anesthesia Can cause constipation. You may need to take these actions to prevent or treat constipation: Drink enough fluid to keep your urine pale yellow. Take over-the-counter or prescription medicines. Eat foods that are high in fiber, such as beans, whole grains, and fresh fruits and vegetables. Limit foods that are high in fat and processed sugars, such as fried or sweet foods.  Also, avoid spicy foods.  NAUSEA IS COMMON THE FIRST NIGHT OF SURGERY.  IF IT LASTS BEYOND 24 HOURS, CALL YOUR PROVIDER.  NAUSEA MEDICATION WAS GIVEN AT YOUR PREOP APPOINTMENT THAT YOU CAN TAKE  AFTER SURGERY. Incision care  Follow instructions from your health care provider about how to take care of your incisions. Make sure you: LEAVE INCISION OPEN AND DRY-NO BANDAGES Leave stitches (sutures), skin glue, or adhesive strips in place UNTIL 2 WEEKS THEN REMOVE IN THE SHOWER.  If adhesive strip edges start to loosen and curl up, you may trim the loose edges. Check your incision areas every day for signs of infection. Check for: More redness, swelling, or pain. Fluid or blood. Warmth. Pus or a bad smell. Activity  Rest as told by your health care provider. Avoid sitting for a long time without moving. Get up to take short walks every 1-2 hours. This is important to improve blood flow and breathing. Ask for help if you feel weak or unsteady.  If you are sore or tired, rest. Return to your normal activities as told by your health care provider. Ask your health care provider what activities are safe for you. Do not lift, push or pull anything that is heavier than 13 lb (4.5 kg), or the limit that you are told, for 6 WEEKS after surgery or until your health care provider says that it is safe. If you were given a sedative during the procedure, it can affect you for several hours. Do not drive or operate machinery until your health care provider says that it is safe. Lifestyle Do not use any products that contain nicotine or tobacco. These products include cigarettes, chewing tobacco, and vaping devices, such as e-cigarettes. These can delay healing after surgery. If you need help quitting, ask your health care provider.  Do not drink alcohol until your health care provider approves. Take a daily multivitamin and keep a high protein diet for wound healing  DO NOT HAVE INTERCOURSE UNTIL YOU ARE INSTRUCTED THAT IT IS SAFE TO DO SO  Post operative appointments need to be scheduled at 2, 6 and 10 weeks.  You can come anytime before these with any concerns.   Discussed and reviewed with patient  risks with early intercourse or use of any foreign objects (externally or internally) can increase your risk including but not limited to the risk of vaginal cuff separation and or infection, risks for bowel involvement, risk for emergent surgery, and hospital admission with need for antibiotics.  Discussed in cases with cuff separation and bowel involvement there may be the need for colostomy placement as well.  In no situation should she have intercourse unless cleared to do so.  This can be anywhere from 10 weeks or longer after surgery.  General instructions YOU MAY TAKE SHOWERS ONLY FOR 2 WEEKS AFTER SURGERY, THEN YOU MAY USE TUBS AND HOT TUBS OR SWIM AFTER THAT Do not douche, use tampons, or have sex for at least 10 weeks, or possibly longer. You will need to have an exam done in the office to be cleared to have intercourse. If you struggle with physical or emotional changes after your procedure, speak with your health care provider or a therapist.  IF YOU HAVE BURNING WITH URINATION, PLEASE CALL YOUR DOCTOR. BLADDER INFECTIONS MAY OCCUR AFTER SURGERY Try to have someone at home with you for the first week to help with your daily chores.  Most patients are driving by the end of the first week after the robotic hysterectomy. Wear compression stockings as told by your health care provider. These stockings help to prevent blood clots and reduce swelling in your legs. Keep all follow-up visits. This is important. Contact a health care provider if: You have any of these signs of infection: Chills or a fever 182f OR GREATER. More redness, swelling, or pain around an incision. Fluid or blood coming from an incision. Warmth coming from an incision. Pus or a bad smell coming from an incision. Burning with urination. Urinary frequency or cramping.   IF YOU HAVE THESE SYMPTOMS, PLEASE CALL THE OFFICE TO COME EVALUATE FOR A BLADDER INFECTION AT [phone] An incision opens. You feel dizzy or  light-headed. You have pain or bleeding when you urinate, or you are unable to urinate. You have abnormal vaginal discharge. You have pain that does not get better with medicine. Get help right away if: You have a fever and your symptoms suddenly get worse. You have severe abdominal pain. Heavy vaginal bleeding, like a period You have chest pain or shortness of breath. You may have chest pain and shortness of breath from the CO2 gas for a few days after surgery.  This is very common.  Walking, Gas-X and motrin will usually help relieve this discomfort You faint. You have pain, swelling, or redness in your leg.  These symptoms may represent a serious problem that is an emergency. Do not wait to see if the symptoms will go away. Get medical help right away. Call your local emergency services (911 in the [place] Do not drive yourself to the hospital. Summary  CONSTIPATION MEDICATION AFTER SURGERY: COLACE, MOM, MIRALAX, GAS X are all helpful to have on hand, if needed.  FILL ALL POSTOP MEDICATION BEFORE SURGERY

## 2024-08-18 NOTE — H&P (View-Only) (Signed)
 GYNECOLOGY  VISIT   PREOP H&P for RLH, bilateral salpingectomy, possible cystectomy for possible endometrioma, possible excision of endometriosis lesions or peritoneal stripping if indicated, possible olympus bag with morcellation.  HPI: 53 y.o.   Married  [place] female  Presenting for surgical consult from Dr. Nikki. [id number] with Patient's last menstrual period was 07/21/2024 (exact date).   Patient with heavy bleeding each month and anemia Has large fibroids She has heavy bleeding with clots for 4 days then has vaginal bleeding without clots for another 6 days each month She is using 5ppd in the daytime and 3 overnight pads each night. She is not on iron  but has anemia and sickle cell trait She does report fatigue She denies any dysmenorrhea but feels pelvic pressure She has discussed options with Dr. Nikki and would like a definitive treatment with the robotic hysterectomy US  with 16cm uterus and possible right endometrioma Has done 5 doses of iv iron  for the anemia and is now on oral iron  until surgery.  GYNECOLOGIC HISTORY: Patient's last menstrual period was 07/21/2024 (exact date). Contraception: POPs Menopausal hormone therapy: n/a Last 2 paps: 2020-WNL, HPV- neg, 10/09/2022-WNL History of abnormal Pap or positive HPV:  no Mammogram: 06/07/2023-WNL, Cat B        OB History     Gravida  4   Para  1   Term  1   Preterm  0   AB  3   Living  1      SAB  0   IAB  3   Ectopic  0   Multiple  0   Live Births  1              Patient Active Problem List   Diagnosis Date Noted   Iron  deficiency anemia 06/25/2024   Closed comedone 06/24/2024   Herpes zoster vaccination declined 02/17/2024   Encounter for annual health examination 10/27/2023   Influenza vaccination declined 10/27/2023   Need for zoster vaccination 10/27/2023   COVID-19 vaccination declined 10/27/2023   Boil 10/27/2023   Decreased energy 10/27/2023   Chronic left-sided low  back pain without sciatica 06/17/2023   OSA on CPAP 03/14/2023   Primary hypertension 02/26/2023   Pure hypercholesterolemia 02/26/2023   Prolactin increased 02/26/2023   Pituitary microadenoma (HCC) 11/20/2022   Intractable chronic migraine without aura and with status migrainosus 11/20/2022   Amenorrhea 11/20/2022   Encounter for birth control pills maintenance 05/02/2021   Resistant hypertension 05/02/2021   LVH (left ventricular hypertrophy) 01/09/2021   Nonspecific abnormal electrocardiogram (ECG) (EKG) 12/16/2020   Headache 03/12/2018   Numbness and tingling of left upper extremity 03/12/2018   Elevated CA-125 10/21/2017   Recurrent boils 07/30/2017   OSA (obstructive sleep apnea) 04/30/2017   Chronic hypokalemia 04/30/2017   Obese 04/30/2017   Fibroid, uterine 08/19/2016   History of abnormal cervical Pap smear 08/19/2016   Snoring 10/13/2015   Hypersomnia with sleep apnea 10/13/2015   Menorrhagia with regular cycle 09/19/2015   Left ovarian cyst 09/19/2015   Type 2 diabetes mellitus with obesity (HCC) 06/08/2015    Past Medical History:  Diagnosis Date   Abnormal EKG    Abnormal Pap smear of cervix about 2013, 2016   Colpo biopsy with normal result per pt.  colpo 2016 HPV changes   Diabetes mellitus without complication (HCC)    DUB (dysfunctional uterine bleeding)    around 2012, saw gyn at the time for eval   Elevated hemoglobin A1c  Fibroid    multiple fibroids   Hypertension    Left ovarian cyst 05/2018   do yearly ultrasound.  Doing GYN ONC consulation for monitoring.    Migraine    OSA on CPAP    Pituitary adenoma (HCC) 02/09/2023   MIR - 8 x 5 mm   Pure hypercholesterolemia 02/26/2023   Resistant hypertension 05/02/2021   Sleep apnea     Past Surgical History:  Procedure Laterality Date   BREAST REDUCTION SURGERY  2005   CHOLECYSTECTOMY  2004   EYE SURGERY     INCISION AND DRAINAGE Bilateral 2015   Bilateral axillary region ebaceous cyst   IR  RADIOLOGIST EVAL & MGMT  09/14/2022   IR RADIOLOGIST EVAL & MGMT  11/08/2022   MASS EXCISION N/A 12/02/2020   Procedure: EXCISION OF TWO MIDLINE BACK SUBCUTANEOUS MASSES;  Surgeon: Lyndel Deward PARAS, MD;  Location: [facility];  Service: General;  Laterality: N/A;   REDUCTION MAMMAPLASTY Bilateral 2005    Current Outpatient Medications  Medication Sig Dispense Refill   amLODipine  (NORVASC ) 5 MG tablet TAKE 1 TABLET(5 MG) BY MOUTH DAILY 90 tablet 3   Blood Glucose Monitoring Suppl (ONE TOUCH ULTRA MINI) w/Device KIT 1 each by Does not apply route 3 (three) times daily as needed. (Patient not taking: Reported on 07/23/2024) 1 kit 1   cabergoline  (DOSTINEX ) 0.5 MG tablet TAKE 1/2 TABLET BY MOUTH 2 TIMES A WEEK 8 tablet 0   doxycycline  (VIBRAMYCIN ) 50 MG capsule Take 1 capsule (50 mg total) by mouth daily. 90 capsule 1   EPINEPHrine 0.3 mg/0.3 mL IJ SOAJ injection Inject 0.3 mLs into the muscle as needed. For Anaphylaxis     Ferric Maltol  (ACCRUFER ) 30 MG CAPS Take 1 capsule (30 mg total) by mouth daily. 90 capsule 1   ferrous sulfate  325 (65 FE) MG EC tablet Take 1 tablet by mouth 2 (two) times daily.     glucose blood test strip Use as instructed (Patient not taking: Reported on 07/23/2024) 100 each 12   hydrALAZINE  (APRESOLINE ) 25 MG tablet TAKE 1 TABLET(25 MG) BY MOUTH TWICE DAILY 180 tablet 2   Lancets (ONETOUCH ULTRASOFT) lancets Use as instructed (Patient not taking: Reported on 07/23/2024) 100 each 12   MOUNJARO  12.5 MG/0.5ML Pen ADMINISTER 12.5 MG UNDER THE SKIN 1 TIME A WEEK 6 mL 1   nebivolol  (BYSTOLIC ) 10 MG tablet Take 1 tablet (10 mg total) by mouth daily. 90 tablet 2   norethindrone  (MICRONOR ) 0.35 MG tablet Take 1 tablet (0.35 mg total) by mouth daily. (Patient not taking: Reported on 07/23/2024) 84 tablet 3   progesterone  (PROMETRIUM ) 100 MG capsule Take 1 capsule (100 mg total) by mouth at bedtime. May increase to two tablets at bedtime with persistent heavy bleeding.  Take until hysterectomy 30 capsule 11   spironolactone  (ALDACTONE ) 25 MG tablet TAKE 1 TABLET(25 MG) BY MOUTH DAILY 90 tablet 2   valsartan  (DIOVAN ) 160 MG tablet TAKE 1 TABLET(160 MG) BY MOUTH DAILY 90 tablet 2   No current facility-administered medications for this visit.     ALLERGIES: Sulfamethoxazole -trimethoprim  and Sulfa  antibiotics  Family History  Problem Relation Age of Onset   CVA Mother 76       deceased, was 61 at the age of her first stroke   Stroke Mother    Diabetes Father    Deep vein thrombosis Father    Breast cancer Maternal Aunt    Heart attack Maternal Aunt    Breast cancer  Maternal Aunt    Sleep apnea Neg Hx     Social History   Socioeconomic History   Marital status: Married    Spouse name: Not on file   Number of children: 1   Years of education: Not on file   Highest education level: Master's degree (e.g., MA, MS, MEng, MEd, MSW, MBA)  Occupational History   Occupation: [employer]  Tobacco Use   Smoking status: Never   Smokeless tobacco: Never  Vaping Use   Vaping status: Never Used  Substance and Sexual Activity   Alcohol use: No   Drug use: Never   Sexual activity: Yes    Birth control/protection: Pill    Comment: Micronor   Other Topics Concern   Not on file  Social History Narrative   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[employer]      Home Situation: lives with husband      Spiritual Beliefs: Baptist      Lifestyle: walks every other day; diet is bad      Social Drivers of Corporate investment banker Strain: Low Risk  (02/13/2024)   Overall Financial Resource Strain (CARDIA)    Difficulty of Paying Living Expenses: Not hard at all  Food Insecurity: No Food Insecurity (02/13/2024)   Hunger Vital Sign    Worried About Running Out of Food in the Last Year: Never true    Ran Out of Food in the Last Year: Never true  Transportation Needs: No Transportation Needs (02/13/2024)    PRAPARE - Administrator, Civil Service (Medical): No    Lack of Transportation (Non-Medical): No  Physical Activity: Unknown (02/13/2024)   Exercise Vital Sign    Days of Exercise per Week: 0 days    Minutes of Exercise per Session: Not on file  Stress: No Stress Concern Present (02/13/2024)   [employer] of Occupational Health - Occupational Stress Questionnaire    Feeling of Stress : Not at all  Social Connections: Moderately Integrated (02/13/2024)   Social Connection and Isolation Panel    Frequency of Communication with Friends and Family: Twice a week    Frequency of Social Gatherings with Friends and Family: Once a week    Attends Religious Services: More than 4 times per year    Active Member of [employer] or Organizations: No    Attends Engineer, structural: Not on file    Marital Status: Married  Catering manager Violence: Not on file    Review of Systems  See HPI.   PHYSICAL EXAMINATION:   LMP 07/21/2024 (Exact Date)     General appearance: alert, cooperative and appears stated age   Pelvic US  Uterus 16.26. 11.08 x 11.60 cm.  Fibroids:  intramural and subserosal:  5.58 cm, 4.18 cm, 4.47 cm, 2.47 cm, 2.80 cm, 6.36 cm,1.54 cm. EMS 7 mm. Symmetrical.  Left ovary 2.23 cm. x 2.16 cm x 2.29 cm.   Normal.  Right ovary 6.44 x 5.69 x 5.45 cm.  Right ovarian cyst solid, avascular 41 x 42 mm.  Possible endometrioma. No adnexal masses.  No free fluid.    Status: Edited Result - FINAL     Next appt: 08/25/2024 at 04:15 PM in [facility] Micheal Molt, PT)     Dx: Menorrhagia with regular cycle; Fibro...   Test Result Released: Yes (seen)     Messages: Seen   1 Result Note     1 Patient Communication  View Follow-Up Encounter    Component Ref Range & Units (hover) 3 wk ago  SURGICAL PATHOLOGY SURGICAL PATHOLOGY CASE: [phone] PATIENT: Melanie Little Surgical Pathology Report     Clinical History: DUB, menorrhagia, anemia, having  RLH (cf)     FINAL MICROSCOPIC DIAGNOSIS:  A. ENDOMETRIUM, BIOPSY: Fragmented benign endometrium with mixed hormone effect Extensive stromal breakdown with fibrin thrombi consistent with bleeding Negative for polyp, atypia, hyperplasia and carcinoma   GROSS DESCRIPTION:      ASSESSMENT:  Symptomatic Fibroids.  Right ovarian cyst, possible endometrioma. Microadenoma of pituitary gland.  On Dostinex .  On Micronor .  DM.  HTN.  Hx keloids.  Menorrhagia Preop   PLAN:  All options reviewed again. Symptomatic fibroid uterus:  Counseled on all options.  She would like to have the [facility].  Counseled extensively on the procedure including but not limited to what to expect and risks and benefits.  Counseled on postop care and pelvic rest for 10 weeks after the surgery with restricted lifting for 6 weeks after.  Counseled on the benefits of the robotic procedure with faster return to daily activities, improved outcomes, and less risk for complications. She would like to have this scheduled. Discussed and counseled on possible olympus bag and morcellation and how this is performed. She agreed. The risk, benefits, alternatives of the procedure were discussed with patient including but not limited to the risk for bleeding, infection, injury to surrounding structures such as the bowel, vessels, bladder, and ureters were reviewed.  The risk for blood products and risk for an additional procedure, and risk for laparotomy in an extreme event was discussed.  The risk for blood clots are also discussed.  Postoperative care instructions were discussed and reviewed with patient.  Postoperative medications were sent to her pharmacy and patient was instructed to pick up prior to surgery.  She will have a driver to take her there and take her home and she agrees that that person will stay with her overnight. Post care instructions were also put in the wrap-up section of this note for patient, as a reminder of  care instructions. All questions were answered and patient agrees and would like to proceed with the procedure.   DO NOT HAVE INTERCOURSE UNTIL YOU ARE INSTRUCTED THAT IT IS SAFE TO DO SO  Post operative appointments need to be scheduled at 2, 6 and 10 weeks.  You can come anytime before these with any concerns.   Discussed and reviewed with patient risks with early intercourse or use of any foreign objects (externally or internally) can increase your risk including but not limited to the risk of vaginal cuff separation and or infection, risks for bowel involvement, risk for emergent surgery, and hospital admission with need for antibiotics.  Discussed in cases with cuff separation and bowel involvement there may be the need for colostomy placement as well.  Patient understood the serious nature of early intercourse and voiced understanding.  Again discussed under no situation should she have intercourse unless cleared to do so.  This can be anywhere from 10 weeks or longer after surgery.    Patient has not started the prometrium .  Counseled she can stop iron  with surgery.  Repeat blood count today. 30 minutes spent on reviewing records, imaging,  and one on one patient time and counseling patient and documentation Dr. Glennon

## 2024-08-18 NOTE — Progress Notes (Signed)
 GYNECOLOGY  VISIT   PREOP H&P for RLH, bilateral salpingectomy, possible cystectomy for possible endometrioma, possible excision of endometriosis lesions or peritoneal stripping if indicated, possible olympus bag with morcellation.  HPI: 53 y.o.   Married  [place] female  Presenting for surgical consult from Dr. Nikki. [id number] with Patient's last menstrual period was 07/21/2024 (exact date).   Patient with heavy bleeding each month and anemia Has large fibroids She has heavy bleeding with clots for 4 days then has vaginal bleeding without clots for another 6 days each month She is using 5ppd in the daytime and 3 overnight pads each night. She is not on iron  but has anemia and sickle cell trait She does report fatigue She denies any dysmenorrhea but feels pelvic pressure She has discussed options with Dr. Nikki and would like a definitive treatment with the robotic hysterectomy US  with 16cm uterus and possible right endometrioma Has done 5 doses of iv iron  for the anemia and is now on oral iron  until surgery.  GYNECOLOGIC HISTORY: Patient's last menstrual period was 07/21/2024 (exact date). Contraception: POPs Menopausal hormone therapy: n/a Last 2 paps: 2020-WNL, HPV- neg, 10/09/2022-WNL History of abnormal Pap or positive HPV:  no Mammogram: 06/07/2023-WNL, Cat B        OB History     Gravida  4   Para  1   Term  1   Preterm  0   AB  3   Living  1      SAB  0   IAB  3   Ectopic  0   Multiple  0   Live Births  1              Patient Active Problem List   Diagnosis Date Noted   Iron  deficiency anemia 06/25/2024   Closed comedone 06/24/2024   Herpes zoster vaccination declined 02/17/2024   Encounter for annual health examination 10/27/2023   Influenza vaccination declined 10/27/2023   Need for zoster vaccination 10/27/2023   COVID-19 vaccination declined 10/27/2023   Boil 10/27/2023   Decreased energy 10/27/2023   Chronic left-sided low  back pain without sciatica 06/17/2023   OSA on CPAP 03/14/2023   Primary hypertension 02/26/2023   Pure hypercholesterolemia 02/26/2023   Prolactin increased 02/26/2023   Pituitary microadenoma (HCC) 11/20/2022   Intractable chronic migraine without aura and with status migrainosus 11/20/2022   Amenorrhea 11/20/2022   Encounter for birth control pills maintenance 05/02/2021   Resistant hypertension 05/02/2021   LVH (left ventricular hypertrophy) 01/09/2021   Nonspecific abnormal electrocardiogram (ECG) (EKG) 12/16/2020   Headache 03/12/2018   Numbness and tingling of left upper extremity 03/12/2018   Elevated CA-125 10/21/2017   Recurrent boils 07/30/2017   OSA (obstructive sleep apnea) 04/30/2017   Chronic hypokalemia 04/30/2017   Obese 04/30/2017   Fibroid, uterine 08/19/2016   History of abnormal cervical Pap smear 08/19/2016   Snoring 10/13/2015   Hypersomnia with sleep apnea 10/13/2015   Menorrhagia with regular cycle 09/19/2015   Left ovarian cyst 09/19/2015   Type 2 diabetes mellitus with obesity (HCC) 06/08/2015    Past Medical History:  Diagnosis Date   Abnormal EKG    Abnormal Pap smear of cervix about 2013, 2016   Colpo biopsy with normal result per pt.  colpo 2016 HPV changes   Diabetes mellitus without complication (HCC)    DUB (dysfunctional uterine bleeding)    around 2012, saw gyn at the time for eval   Elevated hemoglobin A1c  Fibroid    multiple fibroids   Hypertension    Left ovarian cyst 05/2018   do yearly ultrasound.  Doing GYN ONC consulation for monitoring.    Migraine    OSA on CPAP    Pituitary adenoma (HCC) 02/09/2023   MIR - 8 x 5 mm   Pure hypercholesterolemia 02/26/2023   Resistant hypertension 05/02/2021   Sleep apnea     Past Surgical History:  Procedure Laterality Date   BREAST REDUCTION SURGERY  2005   CHOLECYSTECTOMY  2004   EYE SURGERY     INCISION AND DRAINAGE Bilateral 2015   Bilateral axillary region ebaceous cyst   IR  RADIOLOGIST EVAL & MGMT  09/14/2022   IR RADIOLOGIST EVAL & MGMT  11/08/2022   MASS EXCISION N/A 12/02/2020   Procedure: EXCISION OF TWO MIDLINE BACK SUBCUTANEOUS MASSES;  Surgeon: Lyndel Deward PARAS, MD;  Location: [facility];  Service: General;  Laterality: N/A;   REDUCTION MAMMAPLASTY Bilateral 2005    Current Outpatient Medications  Medication Sig Dispense Refill   amLODipine  (NORVASC ) 5 MG tablet TAKE 1 TABLET(5 MG) BY MOUTH DAILY 90 tablet 3   Blood Glucose Monitoring Suppl (ONE TOUCH ULTRA MINI) w/Device KIT 1 each by Does not apply route 3 (three) times daily as needed. (Patient not taking: Reported on 07/23/2024) 1 kit 1   cabergoline  (DOSTINEX ) 0.5 MG tablet TAKE 1/2 TABLET BY MOUTH 2 TIMES A WEEK 8 tablet 0   doxycycline  (VIBRAMYCIN ) 50 MG capsule Take 1 capsule (50 mg total) by mouth daily. 90 capsule 1   EPINEPHrine 0.3 mg/0.3 mL IJ SOAJ injection Inject 0.3 mLs into the muscle as needed. For Anaphylaxis     Ferric Maltol  (ACCRUFER ) 30 MG CAPS Take 1 capsule (30 mg total) by mouth daily. 90 capsule 1   ferrous sulfate  325 (65 FE) MG EC tablet Take 1 tablet by mouth 2 (two) times daily.     glucose blood test strip Use as instructed (Patient not taking: Reported on 07/23/2024) 100 each 12   hydrALAZINE  (APRESOLINE ) 25 MG tablet TAKE 1 TABLET(25 MG) BY MOUTH TWICE DAILY 180 tablet 2   Lancets (ONETOUCH ULTRASOFT) lancets Use as instructed (Patient not taking: Reported on 07/23/2024) 100 each 12   MOUNJARO  12.5 MG/0.5ML Pen ADMINISTER 12.5 MG UNDER THE SKIN 1 TIME A WEEK 6 mL 1   nebivolol  (BYSTOLIC ) 10 MG tablet Take 1 tablet (10 mg total) by mouth daily. 90 tablet 2   norethindrone  (MICRONOR ) 0.35 MG tablet Take 1 tablet (0.35 mg total) by mouth daily. (Patient not taking: Reported on 07/23/2024) 84 tablet 3   progesterone  (PROMETRIUM ) 100 MG capsule Take 1 capsule (100 mg total) by mouth at bedtime. May increase to two tablets at bedtime with persistent heavy bleeding.  Take until hysterectomy 30 capsule 11   spironolactone  (ALDACTONE ) 25 MG tablet TAKE 1 TABLET(25 MG) BY MOUTH DAILY 90 tablet 2   valsartan  (DIOVAN ) 160 MG tablet TAKE 1 TABLET(160 MG) BY MOUTH DAILY 90 tablet 2   No current facility-administered medications for this visit.     ALLERGIES: Sulfamethoxazole -trimethoprim  and Sulfa  antibiotics  Family History  Problem Relation Age of Onset   CVA Mother 76       deceased, was 61 at the age of her first stroke   Stroke Mother    Diabetes Father    Deep vein thrombosis Father    Breast cancer Maternal Aunt    Heart attack Maternal Aunt    Breast cancer  Maternal Aunt    Sleep apnea Neg Hx     Social History   Socioeconomic History   Marital status: Married    Spouse name: Not on file   Number of children: 1   Years of education: Not on file   Highest education level: Master's degree (e.g., MA, MS, MEng, MEd, MSW, MBA)  Occupational History   Occupation: [employer]  Tobacco Use   Smoking status: Never   Smokeless tobacco: Never  Vaping Use   Vaping status: Never Used  Substance and Sexual Activity   Alcohol use: No   Drug use: Never   Sexual activity: Yes    Birth control/protection: Pill    Comment: Micronor   Other Topics Concern   Not on file  Social History Narrative   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[employer]      Home Situation: lives with husband      Spiritual Beliefs: Baptist      Lifestyle: walks every other day; diet is bad      Social Drivers of Corporate investment banker Strain: Low Risk  (02/13/2024)   Overall Financial Resource Strain (CARDIA)    Difficulty of Paying Living Expenses: Not hard at all  Food Insecurity: No Food Insecurity (02/13/2024)   Hunger Vital Sign    Worried About Running Out of Food in the Last Year: Never true    Ran Out of Food in the Last Year: Never true  Transportation Needs: No Transportation Needs (02/13/2024)    PRAPARE - Administrator, Civil Service (Medical): No    Lack of Transportation (Non-Medical): No  Physical Activity: Unknown (02/13/2024)   Exercise Vital Sign    Days of Exercise per Week: 0 days    Minutes of Exercise per Session: Not on file  Stress: No Stress Concern Present (02/13/2024)   [employer] of Occupational Health - Occupational Stress Questionnaire    Feeling of Stress : Not at all  Social Connections: Moderately Integrated (02/13/2024)   Social Connection and Isolation Panel    Frequency of Communication with Friends and Family: Twice a week    Frequency of Social Gatherings with Friends and Family: Once a week    Attends Religious Services: More than 4 times per year    Active Member of [employer] or Organizations: No    Attends Engineer, structural: Not on file    Marital Status: Married  Catering manager Violence: Not on file    Review of Systems  See HPI.   PHYSICAL EXAMINATION:   LMP 07/21/2024 (Exact Date)     General appearance: alert, cooperative and appears stated age   Pelvic US  Uterus 16.26. 11.08 x 11.60 cm.  Fibroids:  intramural and subserosal:  5.58 cm, 4.18 cm, 4.47 cm, 2.47 cm, 2.80 cm, 6.36 cm,1.54 cm. EMS 7 mm. Symmetrical.  Left ovary 2.23 cm. x 2.16 cm x 2.29 cm.   Normal.  Right ovary 6.44 x 5.69 x 5.45 cm.  Right ovarian cyst solid, avascular 41 x 42 mm.  Possible endometrioma. No adnexal masses.  No free fluid.    Status: Edited Result - FINAL     Next appt: 08/25/2024 at 04:15 PM in [facility] Micheal Molt, PT)     Dx: Menorrhagia with regular cycle; Fibro...   Test Result Released: Yes (seen)     Messages: Seen   1 Result Note     1 Patient Communication  View Follow-Up Encounter    Component Ref Range & Units (hover) 3 wk ago  SURGICAL PATHOLOGY SURGICAL PATHOLOGY CASE: [phone] PATIENT: Melanie Little Surgical Pathology Report     Clinical History: DUB, menorrhagia, anemia, having  RLH (cf)     FINAL MICROSCOPIC DIAGNOSIS:  A. ENDOMETRIUM, BIOPSY: Fragmented benign endometrium with mixed hormone effect Extensive stromal breakdown with fibrin thrombi consistent with bleeding Negative for polyp, atypia, hyperplasia and carcinoma   GROSS DESCRIPTION:      ASSESSMENT:  Symptomatic Fibroids.  Right ovarian cyst, possible endometrioma. Microadenoma of pituitary gland.  On Dostinex .  On Micronor .  DM.  HTN.  Hx keloids.  Menorrhagia Preop   PLAN:  All options reviewed again. Symptomatic fibroid uterus:  Counseled on all options.  She would like to have the [facility].  Counseled extensively on the procedure including but not limited to what to expect and risks and benefits.  Counseled on postop care and pelvic rest for 10 weeks after the surgery with restricted lifting for 6 weeks after.  Counseled on the benefits of the robotic procedure with faster return to daily activities, improved outcomes, and less risk for complications. She would like to have this scheduled. Discussed and counseled on possible olympus bag and morcellation and how this is performed. She agreed. The risk, benefits, alternatives of the procedure were discussed with patient including but not limited to the risk for bleeding, infection, injury to surrounding structures such as the bowel, vessels, bladder, and ureters were reviewed.  The risk for blood products and risk for an additional procedure, and risk for laparotomy in an extreme event was discussed.  The risk for blood clots are also discussed.  Postoperative care instructions were discussed and reviewed with patient.  Postoperative medications were sent to her pharmacy and patient was instructed to pick up prior to surgery.  She will have a driver to take her there and take her home and she agrees that that person will stay with her overnight. Post care instructions were also put in the wrap-up section of this note for patient, as a reminder of  care instructions. All questions were answered and patient agrees and would like to proceed with the procedure.   DO NOT HAVE INTERCOURSE UNTIL YOU ARE INSTRUCTED THAT IT IS SAFE TO DO SO  Post operative appointments need to be scheduled at 2, 6 and 10 weeks.  You can come anytime before these with any concerns.   Discussed and reviewed with patient risks with early intercourse or use of any foreign objects (externally or internally) can increase your risk including but not limited to the risk of vaginal cuff separation and or infection, risks for bowel involvement, risk for emergent surgery, and hospital admission with need for antibiotics.  Discussed in cases with cuff separation and bowel involvement there may be the need for colostomy placement as well.  Patient understood the serious nature of early intercourse and voiced understanding.  Again discussed under no situation should she have intercourse unless cleared to do so.  This can be anywhere from 10 weeks or longer after surgery.    Patient has not started the prometrium .  Counseled she can stop iron  with surgery.  Repeat blood count today. 30 minutes spent on reviewing records, imaging,  and one on one patient time and counseling patient and documentation Dr. Glennon

## 2024-08-19 ENCOUNTER — Ambulatory Visit: Payer: Self-pay | Admitting: Obstetrics and Gynecology

## 2024-08-19 LAB — CBC
HCT: 38.5 % (ref 35.0–45.0)
Hemoglobin: 11.9 g/dL (ref 11.7–15.5)
MCH: 22 pg — ABNORMAL LOW (ref 27.0–33.0)
MCHC: 30.9 g/dL — ABNORMAL LOW (ref 32.0–36.0)
MCV: 71.2 fL — ABNORMAL LOW (ref 80.0–100.0)
MPV: 10 fL (ref 7.5–12.5)
Platelets: 362 Thousand/uL (ref 140–400)
RBC: 5.41 Million/uL — ABNORMAL HIGH (ref 3.80–5.10)
RDW: 20.6 % — ABNORMAL HIGH (ref 11.0–15.0)
WBC: 7 Thousand/uL (ref 3.8–10.8)

## 2024-08-19 LAB — IRON,TIBC AND FERRITIN PANEL
%SAT: 20 % (ref 16–45)
Ferritin: 87 ng/mL (ref 16–232)
Iron: 69 ug/dL (ref 45–160)
TIBC: 348 ug/dL (ref 250–450)

## 2024-08-25 ENCOUNTER — Ambulatory Visit: Payer: Self-pay

## 2024-08-25 DIAGNOSIS — M5459 Other low back pain: Secondary | ICD-10-CM | POA: Diagnosis not present

## 2024-08-25 NOTE — Therapy (Signed)
 OUTPATIENT PHYSICAL THERAPY NOTE   Patient Name: Melanie Little MRN: [id number] DOB:[birth year], 53 y.o., female Today's Date: 08/25/2024  END OF SESSION:   PT End of Session - 08/25/24 1622     Visit Number 5    Number of Visits 16    Date for PT Re-Evaluation 09/10/24    Authorization Type [employer] Managed    PT Start Time 1618    PT Stop Time 1656    PT Time Calculation (min) 38 min    Activity Tolerance Patient tolerated treatment well    Behavior During Therapy [facility] for tasks assessed/performed            Past Medical History:  Diagnosis Date   Abnormal EKG    Abnormal Pap smear of cervix about 2013, 2016   Colpo biopsy with normal result per pt.  colpo 2016 HPV changes   Diabetes mellitus without complication (HCC)    DUB (dysfunctional uterine bleeding)    around 2012, saw gyn at the time for eval   Elevated hemoglobin A1c    Fibroid    multiple fibroids   Hypertension    Left ovarian cyst 05/2018   do yearly ultrasound.  Doing GYN ONC consulation for monitoring.    Migraine    OSA on CPAP    Pituitary adenoma (HCC) 02/09/2023   MIR - 8 x 5 mm   Pure hypercholesterolemia 02/26/2023   Resistant hypertension 05/02/2021   Sleep apnea    Past Surgical History:  Procedure Laterality Date   BREAST REDUCTION SURGERY  2005   CHOLECYSTECTOMY  2004   EYE SURGERY     INCISION AND DRAINAGE Bilateral 2015   Bilateral axillary region ebaceous cyst   IR RADIOLOGIST EVAL & MGMT  09/14/2022   IR RADIOLOGIST EVAL & MGMT  11/08/2022   MASS EXCISION N/A 12/02/2020   Procedure: EXCISION OF TWO MIDLINE BACK SUBCUTANEOUS MASSES;  Surgeon: Lyndel Deward PARAS, MD;  Location: [facility];  Service: General;  Laterality: N/A;   REDUCTION MAMMAPLASTY Bilateral 2005   Patient Active Problem List   Diagnosis Date Noted   Iron  deficiency anemia 06/25/2024   Closed comedone 06/24/2024   Herpes zoster vaccination declined 02/17/2024   Encounter for annual health  examination 10/27/2023   Influenza vaccination declined 10/27/2023   Need for zoster vaccination 10/27/2023   COVID-19 vaccination declined 10/27/2023   Boil 10/27/2023   Decreased energy 10/27/2023   Chronic left-sided low back pain without sciatica 06/17/2023   OSA on CPAP 03/14/2023   Primary hypertension 02/26/2023   Pure hypercholesterolemia 02/26/2023   Prolactin increased 02/26/2023   Pituitary microadenoma (HCC) 11/20/2022   Intractable chronic migraine without aura and with status migrainosus 11/20/2022   Amenorrhea 11/20/2022   Encounter for birth control pills maintenance 05/02/2021   Resistant hypertension 05/02/2021   LVH (left ventricular hypertrophy) 01/09/2021   Nonspecific abnormal electrocardiogram (ECG) (EKG) 12/16/2020   Headache 03/12/2018   Numbness and tingling of left upper extremity 03/12/2018   Elevated CA-125 10/21/2017   Recurrent boils 07/30/2017   OSA (obstructive sleep apnea) 04/30/2017   Chronic hypokalemia 04/30/2017   Obese 04/30/2017   Fibroid, uterine 08/19/2016   History of abnormal cervical Pap smear 08/19/2016   Snoring 10/13/2015   Hypersomnia with sleep apnea 10/13/2015   Menorrhagia with regular cycle 09/19/2015   Left ovarian cyst 09/19/2015   Type 2 diabetes mellitus with obesity (HCC) 06/08/2015    PCP: Georgina Speaks, FNP  REFERRING PROVIDER:  Georgina Sharper  A, MD   REFERRING DIAG:  M54.50 (ICD-10-CM) - Lumbar pain    Rationale for Evaluation and Treatment: Rehabilitation  THERAPY DIAG:  Other low back pain  ONSET DATE: 06/29/2024 date of referral   SUBJECTIVE:                                                                                                                                                                                           SUBJECTIVE STATEMENT: 08/25/2024 Patient reports to PT with 8/10 pain. She states that she had 2 days of improvement in symptoms after last visit.    EVAL: Patient presents to PT  d/t constant left-sided low back pain, that has been present for at least 2 years without known MOI. She has tried OTC medications, and left SI joint injection. She has occasional shooting pain in L leg. At this time, she continues to feel limited with daily activities d/t the pain.    PERTINENT HISTORY:  Relevant PMHx includes DM, DUB, Fibroids, HTN, L ovarian cyst, migraine, OSA, HTN, reduction mammaplasty, L ventricular hypertrophy, Obesity  PAIN:  Are you having pain?  Yes: NPRS scale: 7/10 current/worst Pain location: L Left sided lumbar pain  Pain description: pressure  Aggravating factors: lifting, mopping, laying down with leg straight or on left  Relieving factors: laying with knees bent, heating pad   PRECAUTIONS: None  RED FLAGS: None   WEIGHT BEARING RESTRICTIONS: No  FALLS:  Has patient fallen in last 6 months? No  LIVING ENVIRONMENT: Lives with: lives with their spouse Lives in: House/apartment   OCCUPATION: mental health assessment -- desk job  PLOF: Independent  PATIENT GOALS: Patient would like to return to PLOF with decreased pain severity  NEXT MD VISIT: 07/23/24 with Dr. Glennon, followed by planned Total Hysterectomy [place] on 09/11/2024  OBJECTIVE:  Note: Objective measures were completed at Evaluation unless otherwise noted.  DIAGNOSTIC FINDINGS:   06/10/2024 Lumbar MRI  IMPRESSION: L5-S1 has mild degenerative disc disease and facet arthritis. No degenerative marrow edema.   This level also has mild bilateral degenerative lateral recess narrowing.   The imaged levels are otherwise unremarkable.   05/24/2024 Lumbar Xray   XRs of the lumbar spine from 05/27/2024 was independently reviewed and  interpreted, showing disc height loss at L5/S1. No other significant  degenerative changes. No evidence of instability on flexion/extension  views. No fracture or dislocation seen.  PATIENT SURVEYS:   Modified Oswestry:   MODIFIED OSWESTRY  DISABILITY SCALE  Date: 07/18/2024  Score  Pain intensity 1 = The pain is bad, but I can manage without having to take (1) I can stand as  long as I want but, it increases my pain. pain medication.  2. Personal care (washing, dressing, etc.) 0 =  I can take care of myself normally without causing increased pain.  3. Lifting 4 = I can lift only very light weights  4. Walking 0 = Pain does not prevent me from walking any distance  5. Sitting 1 =  I can only sit in my favorite chair as long as I like.  6. Standing 0 =  I can stand as long as I want without increased pain.  7. Sleeping 0 = Pain does not prevent me from sleeping well.  8. Social Life 0 = My social life is normal and does not increase my pain.  9. Traveling 0 =  I can travel anywhere without increased pain.  10. Employment/ Homemaking 1 = My normal homemaking/job activities increase my pain, but I can still perform all that is required of me  Total 7/50   Interpretation of scores: Score Category Description  0-20% Minimal Disability The patient can cope with most living activities. Usually no treatment is indicated apart from advice on lifting, sitting and exercise  21-40% Moderate Disability The patient experiences more pain and difficulty with sitting, lifting and standing. Travel and social life are more difficult and they may be disabled from work. Personal care, sexual activity and sleeping are not grossly affected, and the patient can usually be managed by conservative means  41-60% Severe Disability Pain remains the main problem in this group, but activities of daily living are affected. These patients require a detailed investigation  61-80% Crippled Back pain impinges on all aspects of the patient's life. Positive intervention is required  81-100% Bed-bound  These patients are either bed-bound or exaggerating their symptoms  Bluford FORBES Zoe DELENA Karon DELENA, et al. Surgery versus conservative management of stable thoracolumbar  fracture: the PRESTO feasibility RCT. [place] ([place]): [employer]; 2021 Nov. [facility] [employer] Assessment, No. 25.62.) Appendix 3, Oswestry Disability Index category descriptors. Available from: Find[employer]el[employer]cz  Minimally Clinically Important Difference (MCID) = 12.8%   COGNITION: Overall cognitive status: Within functional limits for tasks assessed     SENSATION: Not tested   LUMBAR ROM:   AROM eval  Flexion   Extension   Right lateral flexion   Left lateral flexion   Right rotation   Left rotation    (Blank rows = not tested)  LOWER EXTREMITY ROM:     Active  Right eval Left eval  Hip flexion    Hip extension    Hip abduction    Hip adduction    Hip internal rotation    Hip external rotation    Knee flexion    Knee extension    Ankle dorsiflexion    Ankle plantarflexion    Ankle inversion    Ankle eversion     (Blank rows = not tested)  LOWER EXTREMITY MMT:    MMT Right eval Left eval  Hip flexion    Hip extension    Hip abduction 4+ 4  Hip adduction 4+ 4-  Hip internal rotation    Hip external rotation    Knee flexion    Knee extension    Ankle dorsiflexion    Ankle plantarflexion    Ankle inversion    Ankle eversion     (Blank rows = not tested)  LUMBAR SPECIAL TESTS:  Straight leg raise test: Negative, SI Compression/distraction test: Positive, and FABER test: Negative  FUNCTIONAL TESTS:  30 seconds  chair stand test: Deferred for f/u visit as indicated.   TREATMENT DATE:   [facility] Adult PT Treatment:                                                DATE: 08/25/2024  Therapeutic Exercise: Isometric LTR 2 x 10 each side SLR 2 x 10  Glute Bridge, 2 x 8 Glute Bridge, 2 x 8 with gait belt for isometric hip abduction  STS 2 x 10 with GTB Prone knee bend BIL 2 x 10 each unilateral, each side  Manual Therapy Grade 2 mobilization from L3-L5 CPA Grade 2 right to left mob at L4-L5 UPA                                                                                                                                    PATIENT EDUCATION:  Education details: reviewed initial home exercise program; discussion of POC, prognosis and goals for skilled PT   Person educated: Patient Education method: Programmer, multimedia, Demonstration, and Handouts Education comprehension: verbalized understanding, returned demonstration, and needs further education  HOME EXERCISE PROGRAM: Access Code: [email or web address] URL: [email or web address] Date: 07/18/2024 Prepared by: Marko Molt  Exercises - Hooklying Sacroiliac Joint Isometric  - 1 x daily - 7 x weekly - 2 sets - 10 reps - 3-5 sec hold - Supine Bridge with Mini Swiss Ball Between Knees  - 1 x daily - 7 x weekly - 2 sets - 10 reps - 3 sec hold - Supine Active Straight Leg Raise  - 1 x daily - 7 x weekly - 2 sets - 10 reps  ASSESSMENT:  CLINICAL IMPRESSION:  08/25/2024 Olinda had good tolerance of today's treatment session, with ongoing lumbar mobilization at end of session. She reports decreased pain following manual therapy. Plan at this time is to updated HEP at next visit and discharge d/t upcoming surgery.    EVAL: Melanie Little is a 53 y.o. female  who was seen today for physical therapy evaluation and treatment for persistent Low Back Pain with radiating pain to L LE. There is a possibility of contribution of symptoms from presence of uterine fibroids, which are symptomatic  with planned hysterectomy on 09/11/2024.She is demonstrating decreased L hip MMT scores, positive SIJ compression/distraction with presence of concordant symptoms.  She has related pain and difficulty with heavy lifting, moderate-to-heavy household activities, and laying down with left LE straight. She requires skilled PT services at this time to address relevant deficits and improve overall function.     OBJECTIVE IMPAIRMENTS: decreased activity tolerance, decreased strength, and pain.    ACTIVITY LIMITATIONS: carrying, lifting, bending, sleeping, and stairs  PARTICIPATION LIMITATIONS: meal prep, cleaning, laundry, interpersonal relationship, shopping, and community activity  PERSONAL FACTORS: Time since onset of injury/illness/exacerbation and 3+ comorbidities: Relevant PMHx includes DM,  DUB, Fibroids, HTN, L ovarian cyst, migraine, OSA, HTN, reduction mammaplasty, L ventricular hypertrophy, Obesity are also affecting patient's functional outcome.   REHAB POTENTIAL: Fair    CLINICAL DECISION MAKING: Evolving/moderate complexity  EVALUATION COMPLEXITY: Moderate   GOALS: Goals reviewed with patient? YES  SHORT TERM GOALS: Target date: 08/15/2024    Patient will be independent with initial home program at least 3 days/week.  Baseline: provided at eval Goal Status: INITIAL   2.  Patient will demonstrate improved Left hip MMT to at least 4+/5 Baseline: see objective measures Goal Status: INITIAL     LONG TERM GOALS: Target date: 09/10/2024    Patient will report improved overall functional ability with modified ODI score of 3/50 or less.  Baseline: 7/50 Goal Status: INITIAL    2.  Patient will demonstrate ability to perform floor to waist lifting of at least 25# using appropriate body mechanics and with no more than minimal pain in order to safely perform normal daily/occupational tasks.   Baseline: limited to only light weights  Goal Status: INITIAL    3.  Patient will demonstrate ability to perform pushing/pulling activities with at least 25# using appropriate body mechanics and with no more than minimal pain in order to improve tolerance of vacuuming and mopping tasks  Baseline: increases symptoms  Goal status: INITIAL  4.  Patient will report decreased pain severity from 7/10 to 4/10 or less with ADLs/IADLs Goal status: INITIAL    PLAN:  PT FREQUENCY: 1-2x/week  PT DURATION: 8 weeks  PLANNED INTERVENTIONS: 97164- PT Re-evaluation, 97750-  Physical Performance Testing, 97110-Therapeutic exercises, 97530- Therapeutic activity, 97112- Neuromuscular re-education, 97535- Self Care, [id number]- Manual therapy, [id number]- Aquatic Therapy, [id number]- Electrical stimulation (unattended), [id number]- Electrical stimulation (manual), [id number]- Traction (mechanical), 20560 (1-2 muscles), 20561 (3+ muscles)- Dry Needling, Patient/Family education, Taping, Joint mobilization, Spinal mobilization, Cryotherapy, and Moist heat.  PLAN FOR NEXT SESSION: continue SIJ MET, LE strengthening, dynamic core strengthening program; patient education, modalities and manual therapy as indicated for pain modulation and management    Marko Molt, PT, DPT  08/25/2024 5:05 PM

## 2024-08-26 ENCOUNTER — Encounter: Payer: Self-pay | Admitting: *Deleted

## 2024-09-04 NOTE — Telephone Encounter (Signed)
 Miranda contacted patient, questions answered.   Encounter closed.

## 2024-09-05 ENCOUNTER — Ambulatory Visit: Attending: Orthopedic Surgery

## 2024-09-05 DIAGNOSIS — M5459 Other low back pain: Secondary | ICD-10-CM | POA: Insufficient documentation

## 2024-09-05 NOTE — Therapy (Signed)
 OUTPATIENT PHYSICAL THERAPY NOTE DISCHARGE SUMMARY    Patient Name: KEHAULANI FRUIN MRN: [id number] DOB:1971/12/15, 53 y.o., female Today's Date: 09/05/2024   PHYSICAL THERAPY DISCHARGE SUMMARY  Visits from Start of Care: 6   Current functional level related to goals / functional outcomes: See objective findings/assessment    Remaining deficits: See objective findings/assessment    Education / Equipment: See today's treatment/assessment      Patient agrees to discharge. Patient goals were not met. Patient is being discharged due to plateau in progress, and patient is scheduled for surgery next week. She will require new referral for OP PT if her pain continues to limit functional activities.    END OF SESSION:  PT End of Session - 09/05/2024 0818      Visit Number 6    Number of Visits 16     Date for PT Re-Evaluation 09/10/24     Authorization Type [employer] Managed     PT Start Time 0815    PT Stop Time 0854    PT Time Calculation (min) 38 min     Activity Tolerance Patient tolerated treatment well     Behavior During Therapy [facility] for tasks assessed/performed        Past Medical History:  Diagnosis Date   Abnormal EKG    Abnormal Pap smear of cervix about 2013, 2016   Colpo biopsy with normal result per pt.  colpo 2016 HPV changes   Diabetes mellitus without complication (HCC)    DUB (dysfunctional uterine bleeding)    around 2012, saw gyn at the time for eval   Elevated hemoglobin A1c    Fibroid    multiple fibroids   Hypertension    Left ovarian cyst 05/2018   do yearly ultrasound.  Doing GYN ONC consulation for monitoring.    Migraine    OSA on CPAP    Pituitary adenoma (HCC) 02/09/2023   MIR - 8 x 5 mm   Pure hypercholesterolemia 02/26/2023   Resistant hypertension 05/02/2021   Sleep apnea    Past Surgical History:  Procedure Laterality Date   BREAST REDUCTION SURGERY  2005   CHOLECYSTECTOMY  2004   EYE SURGERY     INCISION AND DRAINAGE Bilateral  2015   Bilateral axillary region ebaceous cyst   IR RADIOLOGIST EVAL & MGMT  09/14/2022   IR RADIOLOGIST EVAL & MGMT  11/08/2022   MASS EXCISION N/A 12/02/2020   Procedure: EXCISION OF TWO MIDLINE BACK SUBCUTANEOUS MASSES;  Surgeon: Lyndel Deward PARAS, MD;  Location: [facility];  Service: General;  Laterality: N/A;   REDUCTION MAMMAPLASTY Bilateral 2005   Patient Active Problem List   Diagnosis Date Noted   Iron  deficiency anemia 06/25/2024   Closed comedone 06/24/2024   Herpes zoster vaccination declined 02/17/2024   Encounter for annual health examination 10/27/2023   Influenza vaccination declined 10/27/2023   Need for zoster vaccination 10/27/2023   COVID-19 vaccination declined 10/27/2023   Boil 10/27/2023   Decreased energy 10/27/2023   Chronic left-sided low back pain without sciatica 06/17/2023   OSA on CPAP 03/14/2023   Primary hypertension 02/26/2023   Pure hypercholesterolemia 02/26/2023   Prolactin increased 02/26/2023   Pituitary microadenoma (HCC) 11/20/2022   Intractable chronic migraine without aura and with status migrainosus 11/20/2022   Amenorrhea 11/20/2022   Encounter for birth control pills maintenance 05/02/2021   Resistant hypertension 05/02/2021   LVH (left ventricular hypertrophy) 01/09/2021   Nonspecific abnormal electrocardiogram (ECG) (EKG) 12/16/2020   Headache 03/12/2018  Numbness and tingling of left upper extremity 03/12/2018   Elevated CA-125 10/21/2017   Recurrent boils 07/30/2017   OSA (obstructive sleep apnea) 04/30/2017   Chronic hypokalemia 04/30/2017   Obese 04/30/2017   Fibroid, uterine 08/19/2016   History of abnormal cervical Pap smear 08/19/2016   Snoring 10/13/2015   Hypersomnia with sleep apnea 10/13/2015   Menorrhagia with regular cycle 09/19/2015   Left ovarian cyst 09/19/2015   Type 2 diabetes mellitus with obesity (HCC) 06/08/2015    PCP: Georgina Speaks, FNP  REFERRING PROVIDER:  Georgina Ozell LABOR, MD    REFERRING DIAG:  M54.50 (ICD-10-CM) - Lumbar pain    Rationale for Evaluation and Treatment: Rehabilitation  THERAPY DIAG:  Other low back pain  ONSET DATE: 06/29/2024 date of referral   SUBJECTIVE:                                                                                                                                                                                           SUBJECTIVE STATEMENT: 09/05/2024 Patient reports that her pain remains between 5-7/10 everyday, remaining at 7/10 throughout the end of the day or with increased activity level. She did have one instance of 10/10 over the past weekend, while cooking for the holiday. She agrees to discharge from PT today d/t minimal improvement in symptoms and planned hysterectomy next week.    EVAL: Patient presents to PT d/t constant left-sided low back pain, that has been present for at least 2 years without known MOI. She has tried OTC medications, and left SI joint injection. She has occasional shooting pain in L leg. At this time, she continues to feel limited with daily activities d/t the pain.    PERTINENT HISTORY:  Relevant PMHx includes DM, DUB, Fibroids, HTN, L ovarian cyst, migraine, OSA, HTN, reduction mammaplasty, L ventricular hypertrophy, Obesity  PAIN:  Are you having pain?  Yes: NPRS scale: 7/10 current/worst Pain location: L Left sided lumbar pain  Pain description: pressure  Aggravating factors: lifting, mopping, laying down with leg straight or on left  Relieving factors: laying with knees bent, heating pad   PRECAUTIONS: None  RED FLAGS: None   WEIGHT BEARING RESTRICTIONS: No  FALLS:  Has patient fallen in last 6 months? No  LIVING ENVIRONMENT: Lives with: lives with their spouse Lives in: House/apartment   OCCUPATION: mental health assessment -- desk job  PLOF: Independent  PATIENT GOALS: Patient would like to return to PLOF with decreased pain severity  NEXT MD VISIT: 07/23/24 with  Dr. Glennon, followed by planned Total Hysterectomy [place] on 09/11/2024  OBJECTIVE:  Note: Objective measures were completed at Evaluation unless  otherwise noted.  DIAGNOSTIC FINDINGS:   06/10/2024 Lumbar MRI  IMPRESSION: L5-S1 has mild degenerative disc disease and facet arthritis. No degenerative marrow edema.   This level also has mild bilateral degenerative lateral recess narrowing.   The imaged levels are otherwise unremarkable.   05/24/2024 Lumbar Xray   XRs of the lumbar spine from 05/27/2024 was independently reviewed and  interpreted, showing disc height loss at L5/S1. No other significant  degenerative changes. No evidence of instability on flexion/extension  views. No fracture or dislocation seen.  PATIENT SURVEYS:   Modified Oswestry:   MODIFIED OSWESTRY DISABILITY SCALE  Date: 07/18/2024  Score  Pain intensity 1 = The pain is bad, but I can manage without having to take (1) I can stand as long as I want but, it increases my pain. pain medication.  2. Personal care (washing, dressing, etc.) 0 =  I can take care of myself normally without causing increased pain.  3. Lifting 4 = I can lift only very light weights  4. Walking 0 = Pain does not prevent me from walking any distance  5. Sitting 1 =  I can only sit in my favorite chair as long as I like.  6. Standing 0 =  I can stand as long as I want without increased pain.  7. Sleeping 0 = Pain does not prevent me from sleeping well.  8. Social Life 0 = My social life is normal and does not increase my pain.  9. Traveling 0 =  I can travel anywhere without increased pain.  10. Employment/ Homemaking 1 = My normal homemaking/job activities increase my pain, but I can still perform all that is required of me  Total 7/50   Interpretation of scores: Score Category Description  0-20% Minimal Disability The patient can cope with most living activities. Usually no treatment is indicated apart from advice on lifting,  sitting and exercise  21-40% Moderate Disability The patient experiences more pain and difficulty with sitting, lifting and standing. Travel and social life are more difficult and they may be disabled from work. Personal care, sexual activity and sleeping are not grossly affected, and the patient can usually be managed by conservative means  41-60% Severe Disability Pain remains the main problem in this group, but activities of daily living are affected. These patients require a detailed investigation  61-80% Crippled Back pain impinges on all aspects of the patient's life. Positive intervention is required  81-100% Bed-bound  These patients are either bed-bound or exaggerating their symptoms  Bluford FORBES Zoe DELENA Karon DELENA, et al. Surgery versus conservative management of stable thoracolumbar fracture: the PRESTO feasibility RCT. [place] ([place]): [employer]; 2021 Nov. [facility] Technology Assessment, No. 25.62.) Appendix 3, Oswestry Disability Index category descriptors. Available from: FindJewel[employer].cz  Minimally Clinically Important Difference (MCID) = 12.8%   COGNITION: Overall cognitive status: Within functional limits for tasks assessed     SENSATION: Not tested   LUMBAR ROM:   AROM eval 09/05/2024   Flexion  75%  Extension  75%  Right lateral flexion  100%  Left lateral flexion  75%  Right rotation    Left rotation     (Blank rows = not tested)   LOWER EXTREMITY MMT:    MMT Right eval Left eval Right 09/05/2024  Left 09/05/2024   Hip flexion   5 5  Hip extension      Hip abduction 4+ 4 4+ 4+  Hip adduction 4+ 4- 4+ 4+  Hip internal  rotation      Hip external rotation      Knee flexion      Knee extension      Ankle dorsiflexion      Ankle plantarflexion      Ankle inversion      Ankle eversion       (Blank rows = not tested)  LUMBAR SPECIAL TESTS:  Straight leg raise test: Negative, SI Compression/distraction test: Positive,  and FABER test: Negative  FUNCTIONAL TESTS:  30 seconds chair stand test: Deferred for f/u visit as indicated.   TREATMENT DATE:   [facility] Adult PT Treatment:                                                DATE: 09/05/2024  Therapeutic Exercise: Glute Bridge, 2 x 8  GTB 90/90 isometric hold, 3 x 30 SEC  90/90 marches 2 x 18   Standing Lumbar Extension  Updated and reviewed HE    Therapeutic Activity:  Reassessment of objective measures and subjective assessment regarding progress towards established goals and plan for independence with prescribed home program following discharged from PT                                                                                                                                    PATIENT EDUCATION:  Education details: reviewed initial home exercise program; discussion of POC, prognosis and goals for skilled PT   Person educated: Patient Education method: Explanation, Demonstration, and Handouts Education comprehension: verbalized understanding, returned demonstration, and needs further education  HOME EXERCISE PROGRAM: Access Code: [email or web address] URL: [email or web address] Date: 07/18/2024 Prepared by: Marko Molt  Exercises - Hooklying Sacroiliac Joint Isometric  - 1 x daily - 7 x weekly - 2 sets - 10 reps - 3-5 sec hold - Supine Bridge with Mini Swiss Ball Between Knees  - 1 x daily - 7 x weekly - 2 sets - 10 reps - 3 sec hold - Supine Active Straight Leg Raise  - 1 x daily - 7 x weekly - 2 sets - 10 reps  ASSESSMENT:  CLINICAL IMPRESSION:  Kora has attended 5 visits since initial evaluation. Patient has made fair progress with activity tolerance, and has only met STG. She continues to have significant pain with activity and heavy lifting/pushing/pulling. Patient is encouraged to continue with IHEP until her surgery. D/t scheduled hysterectomy procedure next week, patient will be discharged from skilled PT at this time and  should follow up with referring provider as needed. She may return to OP PT with new referral if indicated following her procedure.    EVAL: Brieonna is a 52 y.o. female  who was seen today for physical therapy evaluation and treatment for persistent Low Back Pain with radiating pain to L LE. There  is a possibility of contribution of symptoms from presence of uterine fibroids, which are symptomatic  with planned hysterectomy on 09/11/2024.She is demonstrating decreased L hip MMT scores, positive SIJ compression/distraction with presence of concordant symptoms.  She has related pain and difficulty with heavy lifting, moderate-to-heavy household activities, and laying down with left LE straight. She requires skilled PT services at this time to address relevant deficits and improve overall function.     OBJECTIVE IMPAIRMENTS: decreased activity tolerance, decreased strength, and pain.   ACTIVITY LIMITATIONS: carrying, lifting, bending, sleeping, and stairs  PARTICIPATION LIMITATIONS: meal prep, cleaning, laundry, interpersonal relationship, shopping, and community activity  PERSONAL FACTORS: Time since onset of injury/illness/exacerbation and 3+ comorbidities: Relevant PMHx includes DM, DUB, Fibroids, HTN, L ovarian cyst, migraine, OSA, HTN, reduction mammaplasty, L ventricular hypertrophy, Obesity are also affecting patient's functional outcome.   REHAB POTENTIAL: Fair    CLINICAL DECISION MAKING: Evolving/moderate complexity  EVALUATION COMPLEXITY: Moderate   GOALS: Goals reviewed with patient? YES  SHORT TERM GOALS: Target date: 08/15/2024    Patient will be independent with initial home program at least 3 days/week.  Baseline: provided at eval Goal Status: MET   2.  Patient will demonstrate improved Left hip MMT to at least 4+/5 Baseline: see objective measures Goal Status: MET    LONG TERM GOALS: Target date: 09/10/2024    Patient will report improved overall functional  ability with modified ODI score of 3/50 or less.  Baseline: 7/50 09/05/24: 5/50 Goal Status: not met    2.  Patient will demonstrate ability to perform floor to waist lifting of at least 25# using appropriate body mechanics and with no more than minimal pain in order to safely perform normal daily/occupational tasks.   Baseline: limited to only light weights Goal Status: NOT MET    3.  Patient will demonstrate ability to perform pushing/pulling activities with at least 25# using appropriate body mechanics and with no more than minimal pain in order to improve tolerance of vacuuming and mopping tasks  Baseline: increases symptoms  Goal status: NOT MET  4.  Patient will report decreased pain severity from 7/10 to 4/10 or less with ADLs/IADLs 09/05/24: 5-7/10 with daily activities  Goal status: NOT MET    PLAN:  PT FREQUENCY: 1-2x/week  PT DURATION: 8 weeks  PLANNED INTERVENTIONS: 97164- PT Re-evaluation, 97750- Physical Performance Testing, 97110-Therapeutic exercises, 97530- Therapeutic activity, 97112- Neuromuscular re-education, 97535- Self Care, 02859- Manual therapy, [id number]- Aquatic Therapy, H9716- Electrical stimulation (unattended), [id number]- Electrical stimulation (manual), [id number]- Traction (mechanical), 20560 (1-2 muscles), 20561 (3+ muscles)- Dry Needling, Patient/Family education, Taping, Joint mobilization, Spinal mobilization, Cryotherapy, and Moist heat.    Marko Molt, PT, DPT  09/06/2024 7:35 PM

## 2024-09-07 ENCOUNTER — Encounter (HOSPITAL_COMMUNITY): Payer: Self-pay

## 2024-09-07 ENCOUNTER — Encounter (HOSPITAL_COMMUNITY): Payer: Self-pay | Admitting: Obstetrics and Gynecology

## 2024-09-07 NOTE — Progress Notes (Signed)
 Spoke w/ via phone for pre-op interview--- pt Lab needs dos----   cbc/ bmp/ t&s/ upt      Lab results------  current EKG in epic/ chart;  current A1c in epic dated 06-16-2024 (5.5) COVID test -----patient states asymptomatic no test needed Arrive at -------  0530 on  09-11-2024 NPO after MN w/ exception sips of water w/ meds Pre-Surgery Ensure or G2:  n/a  Med rec completed Medications to take morning of surgery ----- norvasc  Diabetic medication ----- glp1  GLP1 agonist last dose: 09-02-2024 GLP1 instructions: pt stated was given instructions stop 7 days prior to surgery by office  Patient instructed no nail polish to be worn day of surgery Patient instructed to bring photo id and insurance card day of surgery Patient aware to have Driver (ride ) / caregiver    for 24 hours after surgery - husband, archie Todt Patient Special Instructions ----- sent pt surgical instructions to her mychart account in epic.  Pt verbalized understanding of instructions without furthers questions and number given to call if questions prior to surgery.  Pt will get hibiclens  from any pharmacy Pre-Op special Instructions -----  n/a  Patient verbalized understanding of instructions that were given at this phone interview. Patient denies chest pain, sob, fever, cough at the interview.    Anesthesia Review: Resistant HTN;   OSA w/ cpap (uses nightly);  Pituitary adenoma w/ hyperprolactinemia;  DM2 Pt has telephone cardiac clearance on 06-23-2024 by Josefa Beauvais NP in epic/ chart  PCP:  Gaines Ada NP [facility] 06-16-2024) Cardiologist :  Dr ONEIDA. Raford (lov 03-09-2024) Neurologist:  Dr Dohmeier graciela 03-05-2024 , follows for OSA / pituitary adenoma) Endocrinologist:  Odella Jacobson NP (per pt 02/2024)   Chest x-ray : no EKG : 03-15-2024 Echo : 01-31-2021 Stress test:  [facility] 01-31-2021 Cardiac Cath :  no  Activity level:  denies sob w/ any activity Sleep Study/ CPAP :  yes last one in epic 09-10-2022/  pt  uses cpap nightly Fasting Blood Sugar :      / Checks Blood Sugar -- times a day:  does not check  Blood Thinner/ Instructions /Last Dose: no ASA / Instructions/ Last Dose :  no

## 2024-09-10 MED ORDER — SODIUM CHLORIDE 0.9 % IV SOLN
INTRAVENOUS | Status: DC
  Filled 2024-09-10: qty 10

## 2024-09-11 ENCOUNTER — Ambulatory Visit (HOSPITAL_COMMUNITY)
Admission: RE | Admit: 2024-09-11 | Discharge: 2024-09-11 | Disposition: A | Discharge status: Home / Self Care | Attending: Obstetrics and Gynecology | Admitting: Obstetrics and Gynecology

## 2024-09-11 ENCOUNTER — Encounter (HOSPITAL_COMMUNITY): Payer: Self-pay | Admitting: Obstetrics and Gynecology

## 2024-09-11 ENCOUNTER — Ambulatory Visit (HOSPITAL_COMMUNITY): Payer: Self-pay | Admitting: Anesthesiology

## 2024-09-11 ENCOUNTER — Ambulatory Visit (HOSPITAL_COMMUNITY)

## 2024-09-11 ENCOUNTER — Other Ambulatory Visit: Payer: Self-pay

## 2024-09-11 ENCOUNTER — Encounter (HOSPITAL_COMMUNITY)
Admission: RE | Disposition: A | Discharge status: Home / Self Care | Payer: Self-pay | Source: Home / Self Care | Attending: Obstetrics and Gynecology

## 2024-09-11 ENCOUNTER — Ambulatory Visit (HOSPITAL_BASED_OUTPATIENT_CLINIC_OR_DEPARTMENT_OTHER): Payer: Self-pay | Admitting: Anesthesiology

## 2024-09-11 DIAGNOSIS — N80102 Endometriosis of left ovary, unspecified depth: Secondary | ICD-10-CM | POA: Diagnosis not present

## 2024-09-11 DIAGNOSIS — G4733 Obstructive sleep apnea (adult) (pediatric): Secondary | ICD-10-CM | POA: Insufficient documentation

## 2024-09-11 DIAGNOSIS — Z6829 Body mass index (BMI) 29.0-29.9, adult: Secondary | ICD-10-CM | POA: Insufficient documentation

## 2024-09-11 DIAGNOSIS — N80123 Deep endometriosis of bilateral ovaries: Secondary | ICD-10-CM | POA: Diagnosis not present

## 2024-09-11 DIAGNOSIS — N920 Excessive and frequent menstruation with regular cycle: Secondary | ICD-10-CM | POA: Insufficient documentation

## 2024-09-11 DIAGNOSIS — N809 Endometriosis, unspecified: Secondary | ICD-10-CM

## 2024-09-11 DIAGNOSIS — D509 Iron deficiency anemia, unspecified: Secondary | ICD-10-CM | POA: Diagnosis not present

## 2024-09-11 DIAGNOSIS — E119 Type 2 diabetes mellitus without complications: Secondary | ICD-10-CM | POA: Diagnosis not present

## 2024-09-11 DIAGNOSIS — Z7985 Long-term (current) use of injectable non-insulin antidiabetic drugs: Secondary | ICD-10-CM | POA: Diagnosis not present

## 2024-09-11 DIAGNOSIS — N83201 Unspecified ovarian cyst, right side: Secondary | ICD-10-CM | POA: Diagnosis present

## 2024-09-11 DIAGNOSIS — N888 Other specified noninflammatory disorders of cervix uteri: Secondary | ICD-10-CM | POA: Diagnosis not present

## 2024-09-11 DIAGNOSIS — Z79890 Hormone replacement therapy (postmenopausal): Secondary | ICD-10-CM | POA: Insufficient documentation

## 2024-09-11 DIAGNOSIS — Z833 Family history of diabetes mellitus: Secondary | ICD-10-CM | POA: Diagnosis not present

## 2024-09-11 DIAGNOSIS — Z79899 Other long term (current) drug therapy: Secondary | ICD-10-CM | POA: Diagnosis not present

## 2024-09-11 DIAGNOSIS — E669 Obesity, unspecified: Secondary | ICD-10-CM | POA: Diagnosis not present

## 2024-09-11 DIAGNOSIS — R519 Headache, unspecified: Secondary | ICD-10-CM | POA: Insufficient documentation

## 2024-09-11 DIAGNOSIS — D259 Leiomyoma of uterus, unspecified: Secondary | ICD-10-CM | POA: Diagnosis not present

## 2024-09-11 DIAGNOSIS — E221 Hyperprolactinemia: Secondary | ICD-10-CM | POA: Diagnosis present

## 2024-09-11 DIAGNOSIS — D219 Benign neoplasm of connective and other soft tissue, unspecified: Secondary | ICD-10-CM

## 2024-09-11 DIAGNOSIS — E7800 Pure hypercholesterolemia, unspecified: Secondary | ICD-10-CM | POA: Diagnosis not present

## 2024-09-11 DIAGNOSIS — D573 Sickle-cell trait: Secondary | ICD-10-CM | POA: Diagnosis not present

## 2024-09-11 DIAGNOSIS — G4730 Sleep apnea, unspecified: Secondary | ICD-10-CM | POA: Diagnosis not present

## 2024-09-11 DIAGNOSIS — R102 Pelvic and perineal pain: Secondary | ICD-10-CM | POA: Insufficient documentation

## 2024-09-11 DIAGNOSIS — Z01818 Encounter for other preprocedural examination: Secondary | ICD-10-CM

## 2024-09-11 HISTORY — DX: Excessive and frequent menstruation with regular cycle: N92.0

## 2024-09-11 HISTORY — DX: Dysmenorrhea, unspecified: N94.6

## 2024-09-11 HISTORY — DX: Low back pain, unspecified: M54.50

## 2024-09-11 HISTORY — DX: Hyperlipidemia, unspecified: E78.5

## 2024-09-11 HISTORY — PX: SIGMOIDOSCOPY: SHX6686

## 2024-09-11 HISTORY — DX: Furuncle, unspecified: L02.92

## 2024-09-11 HISTORY — PX: ROBOTIC ASSISTED LAPAROSCOPIC OVARIAN CYSTECTOMY: SHX6081

## 2024-09-11 HISTORY — DX: Other chronic pain: G89.29

## 2024-09-11 HISTORY — PX: CYSTOSCOPY: SHX5120

## 2024-09-11 HISTORY — DX: Type 2 diabetes mellitus without complications: E11.9

## 2024-09-11 HISTORY — DX: Chronic migraine without aura, not intractable, without status migrainosus: G43.709

## 2024-09-11 HISTORY — DX: Unspecified ovarian cyst, right side: N83.201

## 2024-09-11 HISTORY — DX: Leiomyoma of uterus, unspecified: D25.9

## 2024-09-11 HISTORY — DX: Hyperprolactinemia: E22.1

## 2024-09-11 HISTORY — DX: Iron deficiency anemia secondary to blood loss (chronic): D50.0

## 2024-09-11 HISTORY — PX: HYSTERECTOMY, TOTAL, LAPAROSCOPIC, ROBOT-ASSISTED WITH SALPINGECTOMY: SHX7587

## 2024-09-11 LAB — CBC
HCT: 40.7 % (ref 36.0–46.0)
Hemoglobin: 13.2 g/dL (ref 12.0–15.0)
MCH: 22.3 pg — ABNORMAL LOW (ref 26.0–34.0)
MCHC: 32.4 g/dL (ref 30.0–36.0)
MCV: 68.9 fL — ABNORMAL LOW (ref 80.0–100.0)
Platelets: 316 K/uL (ref 150–400)
RBC: 5.91 MIL/uL — ABNORMAL HIGH (ref 3.87–5.11)
RDW: 18 % — ABNORMAL HIGH (ref 11.5–15.5)
WBC: 6.9 K/uL (ref 4.0–10.5)
nRBC: 0 % (ref 0.0–0.2)

## 2024-09-11 LAB — BASIC METABOLIC PANEL WITH GFR
Anion gap: 16 — ABNORMAL HIGH (ref 5–15)
BUN: 11 mg/dL (ref 6–20)
CO2: 17 mmol/L — ABNORMAL LOW (ref 22–32)
Calcium: 8.5 mg/dL — ABNORMAL LOW (ref 8.9–10.3)
Chloride: 106 mmol/L (ref 98–111)
Creatinine, Ser: 0.71 mg/dL (ref 0.44–1.00)
GFR, Estimated: >60 mL/min (ref >60)
Glucose, Bld: 98 mg/dL (ref 70–99)
Potassium: 4.2 mmol/L (ref 3.5–5.1)
Sodium: 139 mmol/L (ref 135–145)

## 2024-09-11 LAB — GLUCOSE, CAPILLARY
Glucose-Capillary: 100 mg/dL — ABNORMAL HIGH (ref 70–99)
Glucose-Capillary: 109 mg/dL — ABNORMAL HIGH (ref 70–99)

## 2024-09-11 LAB — TYPE AND SCREEN
ABO/RH(D): B POS
Antibody Screen: NEGATIVE

## 2024-09-11 LAB — POCT PREGNANCY, URINE: Preg Test, Ur: NEGATIVE

## 2024-09-11 LAB — ABO/RH: ABO/RH(D): B POS

## 2024-09-11 SURGERY — HYSTERECTOMY, TOTAL, LAPAROSCOPIC, ROBOT-ASSISTED WITH SALPINGECTOMY
Anesthesia: General | Laterality: Right

## 2024-09-11 MED ORDER — SODIUM CHLORIDE 0.9 % IR SOLN
Status: DC | PRN
  Administered 2024-09-11: 3000 mL

## 2024-09-11 MED ORDER — DEXMEDETOMIDINE HCL IN NACL 80 MCG/20ML IV SOLN
INTRAVENOUS | Status: AC
  Filled 2024-09-11: qty 20

## 2024-09-11 MED ORDER — BUPIVACAINE HCL (PF) 0.5 % IJ SOLN
INTRAMUSCULAR | Status: AC
  Filled 2024-09-11: qty 30

## 2024-09-11 MED ORDER — PROPOFOL 10 MG/ML IV BOLUS
INTRAVENOUS | Status: DC | PRN
  Administered 2024-09-11: 175 ug/kg/min via INTRAVENOUS
  Administered 2024-09-11 (×2): 150 ug/kg/min via INTRAVENOUS
  Administered 2024-09-11: 175 ug/kg/min via INTRAVENOUS
  Administered 2024-09-11: 100 ug/kg/min via INTRAVENOUS

## 2024-09-11 MED ORDER — ACETAMINOPHEN 500 MG PO TABS
ORAL_TABLET | ORAL | Status: AC
  Filled 2024-09-11: qty 2

## 2024-09-11 MED ORDER — SODIUM CHLORIDE 0.9 % IV SOLN
INTRAVENOUS | Status: DC | PRN
  Administered 2024-09-11: 1000 mL

## 2024-09-11 MED ORDER — LIDOCAINE 2% (20 MG/ML) 5 ML SYRINGE
INTRAMUSCULAR | Status: DC | PRN
  Administered 2024-09-11: 40 mg via INTRAVENOUS

## 2024-09-11 MED ORDER — FENTANYL CITRATE (PF) 100 MCG/2ML IJ SOLN
INTRAMUSCULAR | Status: AC
  Filled 2024-09-11: qty 2

## 2024-09-11 MED ORDER — EPHEDRINE 5 MG/ML INJ
INTRAVENOUS | Status: AC
  Filled 2024-09-11: qty 5

## 2024-09-11 MED ORDER — METRONIDAZOLE 500 MG/100ML IV SOLN
500.0000 mg | Freq: Once | INTRAVENOUS | Status: AC
  Administered 2024-09-11: 500 mg via INTRAVENOUS
  Filled 2024-09-11: qty 100

## 2024-09-11 MED ORDER — ACETAMINOPHEN 10 MG/ML IV SOLN: 1000.0000 mg | Freq: Once | INTRAVENOUS | Status: DC | PRN

## 2024-09-11 MED ORDER — MIDAZOLAM HCL 2 MG/2ML IJ SOLN
INTRAMUSCULAR | Status: DC | PRN
  Administered 2024-09-11: 2 mg via INTRAVENOUS

## 2024-09-11 MED ORDER — OXYCODONE HCL 5 MG PO TABS
5.0000 mg | ORAL_TABLET | Freq: Once | ORAL | Status: AC | PRN
  Administered 2024-09-11: 5 mg via ORAL

## 2024-09-11 MED ORDER — FENTANYL CITRATE (PF) 100 MCG/2ML IJ SOLN
25.0000 ug | INTRAMUSCULAR | Status: DC | PRN
  Administered 2024-09-11: 50 ug via INTRAVENOUS

## 2024-09-11 MED ORDER — DEXMEDETOMIDINE HCL IN NACL 80 MCG/20ML IV SOLN
INTRAVENOUS | Status: DC | PRN
  Administered 2024-09-11: 8 ug via INTRAVENOUS
  Administered 2024-09-11: 4 ug via INTRAVENOUS

## 2024-09-11 MED ORDER — ONDANSETRON HCL 4 MG/2ML IJ SOLN
INTRAMUSCULAR | Status: DC | PRN
Start: 2024-09-11 — End: 2024-09-11
  Administered 2024-09-11: 4 mg via INTRAVENOUS

## 2024-09-11 MED ORDER — DEXAMETHASONE SODIUM PHOSPHATE 10 MG/ML IJ SOLN
INTRAMUSCULAR | Status: DC | PRN
  Administered 2024-09-11: 10 mg via INTRAVENOUS

## 2024-09-11 MED ORDER — TRANEXAMIC ACID-NACL 1000-0.7 MG/100ML-% IV SOLN
INTRAVENOUS | Status: AC
  Filled 2024-09-11: qty 100

## 2024-09-11 MED ORDER — PROPOFOL 10 MG/ML IV BOLUS
INTRAVENOUS | Status: AC
  Filled 2024-09-11: qty 20

## 2024-09-11 MED ORDER — BUPIVACAINE HCL (PF) 0.25 % IJ SOLN
INTRAMUSCULAR | Status: AC
  Filled 2024-09-11: qty 30

## 2024-09-11 MED ORDER — ROCURONIUM BROMIDE 10 MG/ML (PF) SYRINGE
PREFILLED_SYRINGE | INTRAVENOUS | Status: DC | PRN
  Administered 2024-09-11: 20 mg via INTRAVENOUS
  Administered 2024-09-11: 40 mg via INTRAVENOUS
  Administered 2024-09-11: 10 mg via INTRAVENOUS
  Administered 2024-09-11 (×2): 20 mg via INTRAVENOUS
  Administered 2024-09-11: 50 mg via INTRAVENOUS
  Administered 2024-09-11: 10 mg via INTRAVENOUS

## 2024-09-11 MED ORDER — DEXAMETHASONE SODIUM PHOSPHATE 10 MG/ML IJ SOLN
INTRAMUSCULAR | Status: AC
  Filled 2024-09-11: qty 1

## 2024-09-11 MED ORDER — ROCURONIUM BROMIDE 10 MG/ML (PF) SYRINGE
PREFILLED_SYRINGE | INTRAVENOUS | Status: AC
  Filled 2024-09-11: qty 10

## 2024-09-11 MED ORDER — POVIDONE-IODINE 10 % EX SWAB
2.0000 | Freq: Once | CUTANEOUS | Status: AC
  Administered 2024-09-11: 2 via TOPICAL

## 2024-09-11 MED ORDER — FENTANYL CITRATE (PF) 250 MCG/5ML IJ SOLN
INTRAMUSCULAR | Status: AC
  Filled 2024-09-11: qty 5

## 2024-09-11 MED ORDER — MIDAZOLAM HCL 2 MG/2ML IJ SOLN
INTRAMUSCULAR | Status: AC
  Filled 2024-09-11: qty 2

## 2024-09-11 MED ORDER — 0.9 % SODIUM CHLORIDE (POUR BTL) OPTIME
TOPICAL | Status: DC | PRN
  Administered 2024-09-11: 1000 mL

## 2024-09-11 MED ORDER — ONDANSETRON HCL 4 MG/2ML IJ SOLN
INTRAMUSCULAR | Status: AC
Start: 2024-09-11 — End: 2024-09-11
  Filled 2024-09-11: qty 2

## 2024-09-11 MED ORDER — ACETAMINOPHEN 500 MG PO TABS
1000.0000 mg | ORAL_TABLET | ORAL | Status: AC
  Administered 2024-09-11: 1000 mg via ORAL

## 2024-09-11 MED ORDER — OXYCODONE HCL 5 MG PO TABS
ORAL_TABLET | ORAL | Status: AC
  Filled 2024-09-11: qty 1

## 2024-09-11 MED ORDER — FENTANYL CITRATE (PF) 250 MCG/5ML IJ SOLN
INTRAMUSCULAR | Status: DC | PRN
  Administered 2024-09-11 (×5): 50 ug via INTRAVENOUS

## 2024-09-11 MED ORDER — SODIUM CHLORIDE 0.9 % IV SOLN
2.0000 g | Freq: Once | INTRAVENOUS | Status: AC
  Administered 2024-09-11 (×3): 2 g via INTRAVENOUS
  Filled 2024-09-11 (×2): qty 2

## 2024-09-11 MED ORDER — BUPIVACAINE HCL (PF) 0.5 % IJ SOLN
INTRAMUSCULAR | Status: DC | PRN
  Administered 2024-09-11: 26 mL

## 2024-09-11 MED ORDER — PROPOFOL 1000 MG/100ML IV EMUL
INTRAVENOUS | Status: AC
  Filled 2024-09-11: qty 100

## 2024-09-11 MED ORDER — ONDANSETRON HCL 4 MG/2ML IJ SOLN: 4.0000 mg | Freq: Once | INTRAMUSCULAR | Status: DC | PRN

## 2024-09-11 MED ORDER — METRONIDAZOLE 500 MG/100ML IV SOLN
INTRAVENOUS | Status: AC
  Filled 2024-09-11: qty 100

## 2024-09-11 MED ORDER — LIDOCAINE 2% (20 MG/ML) 5 ML SYRINGE
INTRAMUSCULAR | Status: AC
Start: 2024-09-11 — End: 2024-09-11
  Filled 2024-09-11: qty 5

## 2024-09-11 MED ORDER — SUGAMMADEX SODIUM 200 MG/2ML IV SOLN
INTRAVENOUS | Status: DC | PRN
  Administered 2024-09-11: 200 mg via INTRAVENOUS
  Administered 2024-09-11: 100 mg via INTRAVENOUS

## 2024-09-11 MED ORDER — TRANEXAMIC ACID-NACL 1000-0.7 MG/100ML-% IV SOLN
1000.0000 mg | INTRAVENOUS | Status: AC
  Administered 2024-09-11: 1000 mg via INTRAVENOUS

## 2024-09-11 MED ORDER — PHENYLEPHRINE 80 MCG/ML (10ML) SYRINGE FOR IV PUSH (FOR BLOOD PRESSURE SUPPORT)
PREFILLED_SYRINGE | INTRAVENOUS | Status: AC
Start: 2024-09-11 — End: 2024-09-11
  Filled 2024-09-11: qty 10

## 2024-09-11 MED ORDER — CHLORHEXIDINE GLUCONATE 0.12 % MT SOLN
15.0000 mL | Freq: Once | OROMUCOSAL | Status: AC
  Administered 2024-09-11: 15 mL via OROMUCOSAL

## 2024-09-11 MED ORDER — SODIUM CHLORIDE 0.9 % IV SOLN
INTRAVENOUS | Status: AC
  Filled 2024-09-11: qty 2

## 2024-09-11 MED ORDER — CHLORHEXIDINE GLUCONATE 0.12 % MT SOLN
OROMUCOSAL | Status: AC
  Filled 2024-09-11: qty 15

## 2024-09-11 MED ORDER — HYDROMORPHONE HCL 1 MG/ML IJ SOLN
INTRAMUSCULAR | Status: AC
  Filled 2024-09-11: qty 0.5

## 2024-09-11 MED ORDER — OXYCODONE HCL 5 MG/5ML PO SOLN: 5.0000 mg | Freq: Once | ORAL | Status: AC | PRN

## 2024-09-11 MED ORDER — LACTATED RINGERS IV SOLN: INTRAVENOUS | Status: DC

## 2024-09-11 MED ORDER — ORAL CARE MOUTH RINSE: 15.0000 mL | Freq: Once | OROMUCOSAL | Status: AC

## 2024-09-11 MED ORDER — HYDROMORPHONE HCL 1 MG/ML IJ SOLN
INTRAMUSCULAR | Status: DC | PRN
  Administered 2024-09-11: .5 mg via INTRAVENOUS

## 2024-09-11 MED ORDER — SODIUM CHLORIDE 0.9 % IV SOLN
2.0000 g | INTRAVENOUS | Status: AC
  Filled 2024-09-11 (×3): qty 2

## 2024-09-11 SURGICAL SUPPLY — 65 items
APPLICATOR ARISTA FLEXITIP XL (MISCELLANEOUS) IMPLANT
BARRIER ADHS 3X4 INTERCEED (GAUZE/BANDAGES/DRESSINGS) IMPLANT
CATH FOLEY 3WAY 5CC 16FR (CATHETERS) ×3 IMPLANT
COVER BACK TABLE 60X90IN (DRAPES) ×3 IMPLANT
COVER TIP SHEARS 8 DVNC (MISCELLANEOUS) ×3 IMPLANT
DEFOGGER SCOPE WARM SEASHARP (MISCELLANEOUS) ×3 IMPLANT
DERMABOND ADVANCED .7 DNX12 (GAUZE/BANDAGES/DRESSINGS) ×3 IMPLANT
DRAPE ARM DVNC X/XI (DISPOSABLE) ×12 IMPLANT
DRAPE COLUMN DVNC XI (DISPOSABLE) ×3 IMPLANT
DRAPE SURG IRRIG POUCH 19X23 (DRAPES) ×3 IMPLANT
DRAPE UTILITY XL STRL (DRAPES) ×3 IMPLANT
DRIVER NDL MEGA SUTCUT DVNCXI (INSTRUMENTS) ×3 IMPLANT
DRIVER NDLE MEGA SUTCUT DVNCXI (INSTRUMENTS) ×3 IMPLANT
DRSG DERMACEA NONADH 3X8 (GAUZE/BANDAGES/DRESSINGS) IMPLANT
DURAPREP 26ML APPLICATOR (WOUND CARE) ×3 IMPLANT
ELECTRODE REM PT RTRN 9FT ADLT (ELECTROSURGICAL) ×3 IMPLANT
FORCEPS PROGRASP DVNC XI (FORCEP) ×3 IMPLANT
GAUZE 4X4 16PLY ~~LOC~~+RFID DBL (SPONGE) IMPLANT
GLOVE BIOGEL PI IND STRL 7.0 (GLOVE) ×6 IMPLANT
GLOVE NEODERM STER SZ 7 (GLOVE) ×9 IMPLANT
GOWN STRL REUS W/ TWL LRG LVL3 (GOWN DISPOSABLE) ×3 IMPLANT
HEMOSTAT ARISTA ABSORB 3G PWDR (HEMOSTASIS) IMPLANT
HIBICLENS CHG 4% 4OZ BTL (MISCELLANEOUS) ×6 IMPLANT
HOLDER FOLEY CATH W/STRAP (MISCELLANEOUS) IMPLANT
IRRIGATION SUCT STRKRFLW 2 WTP (MISCELLANEOUS) ×3 IMPLANT
KIT PINK PAD W/HEAD ARM REST (MISCELLANEOUS) ×3 IMPLANT
KIT SIGMOIDOSCOPE (SET/KITS/TRAYS/PACK) IMPLANT
KIT TURNOVER KIT B (KITS) ×3 IMPLANT
LEGGING LITHOTOMY PAIR STRL (DRAPES) ×3 IMPLANT
MANIFOLD NEPTUNE II (INSTRUMENTS) ×3 IMPLANT
MANIPULATOR ADVINCU DEL 3.5 PL (MISCELLANEOUS) IMPLANT
MANIPULATOR VCARE LG CRV RETR (MISCELLANEOUS) IMPLANT
MORCELLATOR XCISE COR (MISCELLANEOUS) IMPLANT
NS IRRIG 1000ML POUR BTL (IV SOLUTION) ×3 IMPLANT
OBTURATOR OPTICALSTD 8 DVNC (TROCAR) ×3 IMPLANT
OCCLUDER COLPOPNEUMO (BALLOONS) IMPLANT
PACK CYSTO (CUSTOM PROCEDURE TRAY) ×3 IMPLANT
PACK ROBOT WH (CUSTOM PROCEDURE TRAY) ×3 IMPLANT
PACK ROBOTIC GOWN (GOWN DISPOSABLE) ×3 IMPLANT
PAD OB MATERNITY 11 LF (PERSONAL CARE ITEMS) ×3 IMPLANT
RUMI II 3.0CM BLUE KOH-EFFICIE (DISPOSABLE) IMPLANT
RUMI II GYRUS 2.5CM BLUE (DISPOSABLE) IMPLANT
RUMI II GYRUS 3.5CM BLUE (DISPOSABLE) IMPLANT
RUMI II GYRUS 4.0CM BLUE (DISPOSABLE) IMPLANT
SCISSORS MNPLR CVD DVNC XI (INSTRUMENTS) ×3 IMPLANT
SCOPETTES 8 STERILE (MISCELLANEOUS) IMPLANT
SEAL UNIV 5-12 XI (MISCELLANEOUS) ×9 IMPLANT
SEALER VESSEL EXT DVNC XI (MISCELLANEOUS) IMPLANT
SET CYSTO W/LG BORE CLAMP LF (SET/KITS/TRAYS/PACK) ×3 IMPLANT
SET TUBE SMOKE EVAC HIGH FLOW (TUBING) ×3 IMPLANT
SOL PREP POV-IOD 4OZ 10% (MISCELLANEOUS) ×3 IMPLANT
SPIKE FLUID TRANSFER (MISCELLANEOUS) ×3 IMPLANT
SUT MNCRL AB 4-0 PS2 18 (SUTURE) ×3 IMPLANT
SUT VICRYL 0 UR6 27IN ABS (SUTURE) IMPLANT
SUT VLOC 180 0 6IN GS21 (SUTURE) IMPLANT
SUT VLOC 180 0 9IN GS21 (SUTURE) ×6 IMPLANT
SUTURE DVC VLC 180 0 12IN GS21 (SUTURE) IMPLANT
SYSTEM RETRIEVL 5MM INZII UNIV (BASKET) IMPLANT
SYSTEM TISSUE EXTR PNEUMOLINER (MISCELLANEOUS) IMPLANT
TIP UTERINE 6.7X10CM GRN DISP (MISCELLANEOUS) IMPLANT
TIP UTERINE 6.7X8CM BLUE DISP (MISCELLANEOUS) IMPLANT
TOWEL GREEN STERILE (TOWEL DISPOSABLE) ×3 IMPLANT
TRAY FOLEY W/BAG SLVR 14FR (SET/KITS/TRAYS/PACK) ×3 IMPLANT
UNDERPAD 30X36 HEAVY ABSORB (UNDERPADS AND DIAPERS) ×3 IMPLANT
WATER STERILE IRR 500ML POUR (IV SOLUTION) ×3 IMPLANT

## 2024-09-11 NOTE — Op Note (Signed)
 Date: 09/11/24  Patient: Melanie Little MRN: [id number]  Preoperative Diagnosis: Endometriosis Postoperative Diagnosis: Same  Procedure: Rigid proctoscopy  Surgeon: Leonor Dawn, MD  Procedure details: I was consulted intraoperatively by Dr. Glennon due to concern for involvement of the upper rectum by endometriosis. When I entered the procedure the robot was docked to the patient. There was endometriosis of the left ovary, which when elevated was clearly separate from the distal sigmoid colon and upper rectum. The sigmoid colon was normal in appearance, and the upper rectum appeared grossly normal with no signs of injury. After completion of the left oophorectomy by Dr. Glennon, I placed a rigid proctoscope in the rectum to perform a leak test. The distal sigmoid colon was clamped with an atraumatic laparoscopic grasper and the rectum was insufflated with air. The pelvis was instilled with saline and observed. There was no bubbling noted to suggest a rectal injury. The air was evacuated and the proctoscope was removed. The remainder of the procedure was turned back over the Dr. Glennon.  Leonor Dawn, MD 09/11/24 11:22 AM

## 2024-09-11 NOTE — Anesthesia Preprocedure Evaluation (Signed)
 Anesthesia Evaluation  Patient identified by MRN, date of birth, ID band Patient awake    Reviewed: Allergy & Precautions, NPO status , Patient's Chart, lab work & pertinent test results  History of Anesthesia Complications Negative for: history of anesthetic complications  Airway Mallampati: II  TM Distance: >3 FB Neck ROM: Full    Dental no notable dental hx. (+) Teeth Intact   Pulmonary sleep apnea and Continuous Positive Airway Pressure Ventilation , neg COPD, Patient abstained from smoking.Not current smoker   Pulmonary exam normal breath sounds clear to auscultation       Cardiovascular Exercise Tolerance: Good METShypertension, Pt. on medications (-) CAD and (-) Past MI (-) dysrhythmias  Rhythm:Regular Rate:Normal - Systolic murmurs    Neuro/Psych  Headaches  negative psych ROS   GI/Hepatic ,neg GERD  ,,(+)     (-) substance abuse    Endo/Other  diabetes, Well Controlled  GLP1 agonist held for >7 days. Sdenies GI symptoms today  Renal/GU negative Renal ROS     Musculoskeletal   Abdominal   Peds  Hematology   Anesthesia Other Findings Past Medical History: No date: Boil     Comment:  R axilla No date: Chronic low back pain No date: Chronic migraine without aura No date: Dysmenorrhea 2016: History of abnormal cervical Pap smear     Comment:  colpo bx w/ normal results per pt . colpo HPV changes No date: Hyperlipidemia No date: Hyperprolactinemia (HCC)     Comment:  followed by endocrinologist--- duwaine doerr NP No date: Hypertension No date: Iron  deficiency anemia due to chronic blood loss     Comment:  recent Iron  infusions---- x5 07/ 2025 No date: Menorrhagia with regular cycle 2016: OSA on CPAP     Comment:  (09-07-2024  pt stated uses cpap nightly)  followed by               dr dohmeier;   dx 2016  HST ;  restested HST 09-10-2022               in epic 02/09/2023: Pituitary adenoma (HCC)      Comment:  MIR - 8 x 5 mm;  followed by neurologist--- dr chalice               and neurosurgeon --- dr tressia 05/02/2021: Resistant hypertension     Comment:  followed by cardiologist--- dr t. raford;   NUC 02/               2022  LR normal perfusion, nuclear stress ef 50% but very              elevated BP;   echo 01/2021 ef 60-65% w/ moderate LVH;                CTC 09-/2024  calcium  score zero;  normal renal arter               dopplers 11/ 2018 No date: Right ovarian cyst No date: Type 2 diabetes mellitus (HCC)     Comment:  followed by pcp;    (09-07-2024   pt stated does not               check blood sugar at home)  treated w/ mounjaro  No date: Uterine fibroid     Comment:  multiple fibroids  Reproductive/Obstetrics  Anesthesia Physical Anesthesia Plan  ASA: 2  Anesthesia Plan: General   Post-op Pain Management: Tylenol  PO (pre-op)* and Toradol IV (intra-op)*   Induction: Intravenous  PONV Risk Score and Plan: 4 or greater and Ondansetron , Dexamethasone , Midazolam , TIVA and Propofol  infusion  Airway Management Planned: Oral ETT  Additional Equipment: None  Intra-op Plan:   Post-operative Plan: Extubation in OR  Informed Consent: I have reviewed the patients History and Physical, chart, labs and discussed the procedure including the risks, benefits and alternatives for the proposed anesthesia with the patient or authorized representative who has indicated his/her understanding and acceptance.     Dental advisory given  Plan Discussed with: CRNA and Surgeon  Anesthesia Plan Comments: (Discussed risks of anesthesia with patient, including PONV, sore throat, lip/dental/eye damage, possible blood transfusion. Rare risks discussed as well, such as cardiorespiratory and neurological sequelae, and allergic reactions. Discussed the role of CRNA in patient's perioperative care. Patient understands.)          Anesthesia Quick Evaluation

## 2024-09-11 NOTE — Interval H&P Note (Signed)
 History and Physical Interval Note:  09/11/2024 7:06 AM  Melanie Little  has presented today for surgery, with the diagnosis of right ovarian cyst, hyperprolactinemia, endometriosis, fibroids, menorrhagia with regular cycle.  The various methods of treatment have been discussed with the patient and family. After consideration of risks, benefits and other options for treatment, the patient has consented to  Procedure(s) with comments: HYSTERECTOMY, TOTAL, LAPAROSCOPIC, ROBOT-ASSISTED WITH SALPINGECTOMY (Bilateral) - possible olympus bag with morcellation EXCISION, CYST, OVARY, ROBOT-ASSISTED, LAPAROSCOPIC (Right) - possible right oophorectomy vs. Cystectomy for endometrioma CYSTOSCOPY (N/A) as a surgical intervention.  The patient's history has been reviewed, patient examined, no change in status, stable for surgery.  I have reviewed the patient's chart and labs.  Questions were answered to the patient's satisfaction.     Melanie Little

## 2024-09-11 NOTE — Transfer of Care (Signed)
 Immediate Anesthesia Transfer of Care Note  Patient: Melanie Little  Procedure(s) Performed: HYSTERECTOMY, TOTAL, LAPAROSCOPIC, ROBOT-ASSISTED WITH SALPINGECTOMY AND LEFT OOPHORECTOMY (Bilateral) Cystotmy, OVARY, ROBOT-ASSISTED, LAPAROSCOPIC (Right) CYSTOSCOPY SIGMOIDOSCOPY/ EXAM UNDER ANESTHESIA  Patient Location: PACU  Anesthesia Type:General  Level of Consciousness: awake, patient cooperative, and responds to stimulation  Airway & Oxygen Therapy: Patient Spontanous Breathing and Patient connected to face mask oxygen  Post-op Assessment: Report given to RN, Post -op Vital signs reviewed and stable, and Patient moving all extremities X 4  Post vital signs: Reviewed and stable  Last Vitals:  Vitals Value Taken Time  BP    Temp    Pulse    Resp    SpO2      Last Pain:  Vitals:   09/11/24 0604  TempSrc: Oral  PainSc: 4       Patients Stated Pain Goal: 5 (09/11/24 0604)  Complications: No notable events documented.

## 2024-09-11 NOTE — Discharge Instructions (Signed)

## 2024-09-11 NOTE — OR Nursing (Signed)
 The left ovary was sent in a separate specimen

## 2024-09-11 NOTE — Op Note (Addendum)
 09/11/2024   [id number]  Melanie Little        OPERATIVE REPORT   Preop Diagnosis: pelvic pain, menorrhagia, anemia symptomatic fibroids, endometriomas Procedure: robotic hysterectomy with olympus bag with morcellation under direct evaluation, left oophorectomy, bilateral salpingectomy, bilateral ovarian cystotomy to drain endometriomas, lysis of adhesions, cystoscopy   Surgeon: Dr. Almarie Rollo Carpen Assistant: Circulator: Waddell Silvano HERO, RN Relief Circulator: Clarisse Shann CLEMENS Gwenith, Heather  J, RN; Debby Catheryn HERO, RN Relief Scrub: Janell, Marina  G Scrub Person: Key, Jackolyn VIRGIN Leos, Hadassah MALACHI Neysa Joesph JAYSON RN First Assistant: Jackquline Judyann CROME, RN     Intra op consult: Dr. Dasie with general surgery to evaluate bowel adhesions and perform bubble test  please see her operative report  Fluids: please see anesthesia report   Complications: None Anesthesia: General     Findings:  16 cm fibroid uterus, bilateral endometriomas.  Stage 4 endometriosis.  Adhesions noted from the left ovary to uterus and large bowel and left side wall. Small endometrioma on the left that drained with lysis of adhesions. Posterior uterus with large bowel adhered to it in a 1cm portion. Right ovary attached to right side wall and uterus and fallopian tube. Large endometrioma that drained when this was cleared from the side wall.  Due to extensive adhesions to the uterus and from endometriosis a decision was made to remove the left ovary to avoid a future complications or future surgery for removal.  Cystoscopy with normal bladder and patent ureters at the end of the case. Intra-op consult with general surgery confirmed  no evidence of bowel entry and allowed guidance of left bowel adhesions from left ovary for removal.  Bowel bubble test was negative and showed no evidence of air when occluded and filled with saline. Cystoscopy at the end of the case with normal bladder and patent  ureters bilaterally.   Estimated blood loss: 150cc   Specimens: Uterus, cervix and bilateral tubes and left ovary. Right ovary remains intact  Disposition of specimen: Pathology         Patient is taken to the operating room. She is placed in the supine position. She is a running IV in place. Informed consent was present on the chart. SCDs on her lower extremities and functioning properly. Patient was positioned while she was awake.  Her legs were placed in the low lithotomy position in Kiester stirrups. Her arms were tucked by the side.  General endotracheal anesthesia was administered by the anesthesia staff without difficulty.       Dura prep was then used to prep the abdomen and Hibiclens  was used to prep the inner thighs, perineum and vagina. Once 3 minutes had past the patient was draped in a normal standard fashion. A proper time out was performed and everyone agreed.  The legs were lifted to the high lithotomy position. A bivalve speculum was inserted into the vagina and the anterior lip of the cervix was grasped with single-tooth tenaculum.  The uterus sounded to  12 cm. Pratt dilators were used to dilate the cervix.  The vencula uterine manipulator was obtained inserted into the endometrial cavity and the bulb of the disposable tip was inflated with 8 cc of normal saline. There was a good fit of the KOH ring around the cervix. The tenaculum and bivavle speculum was removed. There is also good manipulation of the uterus.  A Foley catheter was placed to straight drain.  Clear urine was noted. Legs were lowered to the low lithotomy  position and attention was turned the abdomen.   Superior to the umbilicus, marcaine  0.25% used to anesthetize the skin.  Using #11 blade, 8mm skin incision was made.  The 8mm robotic trocar and sleeve was inserted under direct visualization.  CO2 gas was  started and patient was placed in trendelenburg position.  Three additional 8mm ports were placed under direct  visualization in the left and right lower quadrant and one on the left upper quadrant.   Ureters were identifies.  Attention was turned to the left side.  The ovary, tube was adhered to the side wall, uterus and tube. This was released. The endometrioma drained. A decision was made at the end of the case to remove it due to the adhesions and future complications.  The left IP ligament was noted away from the ureters and this was desiccated and transected with the vessel sealer.  The uterine ovarian pedicle was serially clamped cauterized and incised. Left round ligament was serially clamped cauterized and incised. The anterior and posterior peritoneum of the inferior leaf of the broad ligament were opened. The beginning of the bladder flap was created.  The bladder was taken down below the level of the KOH ring. The left uterine artery skeletonized and then just superior to the KOH ring this vessel was serially clamped, cauterized, and incised.   Attention was turned the right side.  Adhesions were taken down from the side wall to the right ovary and uterus and tube. The posterior aspect of the uterus had a small portion of attached bowel and this was lower than the vaginal cuff and was avoided.  The right endometrioma drained and the ovary had good hemostasis.  The uterus was placed on stretch to the opposite side.  Then the right uterine ovarian pedicle was serially clamped cauterized and incised. Next the right round ligament was serially clamped cauterized and incised. The anterior posterior peritoneum of the inferiorly for the broad ligament were opened. The anterior peritoneum was carried across to the dissection on the left side. The remainder of the bladder flap was created using sharp dissection. The bladder was well below the level of the KOH ring. The right uterine artery skeletonized. Then the right uterine artery, above the level of the KOH ring, was serially clamped cauterized and incised. The  uterus was devascularized at this point.   The colpotomy was performed.  This was carried around a circumferential fashion until the vaginal mucosa was completely incised in the specimen was freed.    Instruments were changed with a needle driver and prograsp.  Using a 12 inch  zero V-lock suture, the cuff was closed by incorporating the anterior and posterior vaginal mucosa in each stitch. This was carried across all the way to the left corner and a running fashion. Two stitches were brought back towards the midline and the suture was cut flush with the vagina. The second stitch was cut and it flipped loose into the abdomen. An extensive attempt was made to locate without xray and a decision was made to remove the uterus and then remove with xray guidance. The stich was found on xray at the end of the case and removed.The needle was brought out the pelvis.    The superior umbilical incision was extended and the fascia as well.  The olympus bag was brought into the cavity and the uterus was placed in it.  The bag was brought up to the incision and the bag was insufflated.  The uterus  was remove in pieces with the [facility] morcellator under direct visualization.  There was no compromise to the bag throughout the entire process.  The final pieces in the bag were elevated and removed while being contained and the bag was brought out of the abdomen. Xray then presented and showed the needed and this was removed and was floating and did not have any attachments.      The pelvis was irrigated. All pedicles were inspected. No bleeding was noted.   CO2 pressures were lowered to 8mm Hg.  Again, no bleeding was noted.  Ureters were noted deep in the pelvis to be peristalsing.     A vaginal occlusive device was used to maintain the pneumoperitoneumAt this point the procedure was completed.  The remaining instruments were removed.  The ports were removed under direct visualization of the laparoscope and the  pneumoperitoneum was relieved.  Arista was used on the right ovary and vaginal cuff for hemostasis in the postop period.   The skin was then closed with subcuticular stitches of 3-0 Vicryl. The skin was cleansed Dermabond was applied. Attention was then turned the vagina and the cuff was inspected. No bleeding was noted.  The Foley catheter was removed.  Cystoscopy was performed.  No sutures or bladder injuries were noted.  Ureters were noted with normal urine jets from each one was seen.  Foley was left out after the cystoscopic fluid was drained and cystoscope removed.  Sponge, lap, needle, instrument counts were correct x2. Patient tolerated the procedure very well. She was awakened from anesthesia, extubated and taken to recovery in stable condition.      Dr. Glennon      Electronically signed by Glennon Almarie POUR, MD at 09/11/2024  2:48 PM Electronically signed by Glennon Almarie POUR, MD at 09/11/2024  2:54 PM.      Dr. Glennon

## 2024-09-11 NOTE — Anesthesia Postprocedure Evaluation (Signed)
 Anesthesia Post Note  Patient: Melanie Little  Procedure(s) Performed: HYSTERECTOMY, TOTAL, LAPAROSCOPIC, ROBOT-ASSISTED WITH SALPINGECTOMY AND LEFT OOPHORECTOMY (Bilateral) Cystotmy, OVARY, ROBOT-ASSISTED, LAPAROSCOPIC (Right) CYSTOSCOPY SIGMOIDOSCOPY/ EXAM UNDER ANESTHESIA     Patient location during evaluation: PACU Anesthesia Type: General Level of consciousness: awake and alert Pain management: pain level controlled Vital Signs Assessment: post-procedure vital signs reviewed and stable Respiratory status: spontaneous breathing, nonlabored ventilation, respiratory function stable and patient connected to nasal cannula oxygen Cardiovascular status: blood pressure returned to baseline and stable Postop Assessment: no apparent nausea or vomiting Anesthetic complications: no   No notable events documented.  Last Vitals:  Vitals:   09/11/24 1445 09/11/24 1500  BP: 136/89 (!) 151/91  Pulse: 77 74  Resp: 10 14  Temp:    SpO2: 100% 100%    Last Pain:  Vitals:   09/11/24 1501  TempSrc:   PainSc: 3    Pain Goal: Patients Stated Pain Goal: 5 (09/11/24 0604)                 Rome Ade

## 2024-09-11 NOTE — Anesthesia Procedure Notes (Signed)
 Procedure Name: Intubation Date/Time: 09/11/2024 7:48 AM  Performed by: Jolynn Mage, CRNAPre-anesthesia Checklist: Patient identified, Patient being monitored, Timeout performed, Emergency Drugs available and Suction available Patient Re-evaluated:Patient Re-evaluated prior to induction Oxygen Delivery Method: Circle System Utilized Preoxygenation: Pre-oxygenation with 100% oxygen Induction Type: IV induction Ventilation: Oral airway inserted - appropriate to patient size and Two handed mask ventilation required Laryngoscope Size: Miller and 2 Grade View: Grade II Tube type: Oral Tube size: 7.0 mm Number of attempts: 1 Airway Equipment and Method: Stylet Placement Confirmation: ETT inserted through vocal cords under direct vision, positive ETCO2 and breath sounds checked- equal and bilateral Secured at: 21 cm Tube secured with: Tape Dental Injury: Teeth and Oropharynx as per pre-operative assessment

## 2024-09-14 ENCOUNTER — Encounter: Payer: Self-pay | Admitting: Nurse Practitioner

## 2024-09-14 ENCOUNTER — Encounter (HOSPITAL_COMMUNITY): Payer: Self-pay | Admitting: Obstetrics and Gynecology

## 2024-09-14 ENCOUNTER — Ambulatory Visit: Payer: Self-pay | Admitting: Obstetrics and Gynecology

## 2024-09-14 LAB — SURGICAL PATHOLOGY

## 2024-09-14 NOTE — Telephone Encounter (Signed)
 Spoke with patient. S/p [facility],BS, left oophorectomy on 09/11/24.   Reports no change in symptoms reported in MyChart message. Hydrating well and gargling with salt water for comfort.   Denies fever, chills, nausea, vomiting or diarrhea.   Passing gas. Denies vaginal bleeding.   Asking if she can continue warm salt water gargle for comfort, advised yes.   Advised I will forward to Dr. Glennon to review and f/u with additional recommendations. Advised Dr. Glennon is out of the office this morning, response may not be immediate. Patient agreeable.   Dr. Glennon -please review.

## 2024-09-17 ENCOUNTER — Telehealth: Payer: Self-pay

## 2024-09-17 ENCOUNTER — Other Ambulatory Visit: Payer: Self-pay | Admitting: Nurse Practitioner

## 2024-09-17 DIAGNOSIS — E1169 Type 2 diabetes mellitus with other specified complication: Secondary | ICD-10-CM

## 2024-09-17 MED ORDER — MOUNJARO 12.5 MG/0.5ML ~~LOC~~ SOAJ: 12.5000 mg | SUBCUTANEOUS | 1 refills | Status: AC

## 2024-09-17 NOTE — Telephone Encounter (Signed)
 Copied from CRM [id number]. Topic: Clinical - Medication Question >> Sep 15, 2024  9:27 AM Myrick T wrote: Reason for CRM: patient called to f/u on her Mounjaro . Advised the mychart message with the attachment was sent to provider on yesterday but can take up to 3 business days. Patient is saying she is already late taking her injections. Please f/u with patient

## 2024-09-24 ENCOUNTER — Ambulatory Visit: Admitting: Obstetrics and Gynecology

## 2024-09-24 VITALS — BP 146/86 | HR 71 | Ht 64.0 in | Wt 170.0 lb

## 2024-09-24 DIAGNOSIS — Z09 Encounter for follow-up examination after completed treatment for conditions other than malignant neoplasm: Secondary | ICD-10-CM

## 2024-09-26 NOTE — Progress Notes (Signed)
 Patient presents for 2 week postop from [facility], bilateral salpingectomy, cystoscopy. She is doing well. No fevers, VB, dysuria or severe abdominal pain.  BP (!) 146/86 (BP Location: Left Arm, Patient Position: Sitting, Cuff Size: Normal)   Pulse 71   Ht 5' 4 (1.626 m)   Wt 170 lb (77.1 kg)   SpO2 99%   BMI 29.18 kg/m   Abdomen: incisions I/c/d, NT, ND  A/p PO from [facility] 2 weeks doing well Encouraged no heavy lifting, pushing, pulling greater than 10 lbs for full 8 weeks 2. Pelvic rest until cleared.  Counseled on risks with early intercourse and severity of cuff dehiscence and she agreed. 3. RTC with any concerns or with heavy bleeding, fevers or severe abdominal pain.  Dr. Glennon

## 2024-09-28 NOTE — Telephone Encounter (Signed)
 Patient was seen for 2wk post-op on 09/24/24.   OV note reviewed.  Routing to advise on vaginal estrogen cream.

## 2024-10-19 ENCOUNTER — Encounter: Payer: Self-pay | Admitting: Nurse Practitioner

## 2024-10-22 ENCOUNTER — Ambulatory Visit (INDEPENDENT_AMBULATORY_CARE_PROVIDER_SITE_OTHER): Admitting: Obstetrics and Gynecology

## 2024-10-22 ENCOUNTER — Encounter: Payer: Self-pay | Admitting: Obstetrics and Gynecology

## 2024-10-22 VITALS — BP 138/60 | HR 85 | Wt 170.4 lb

## 2024-10-22 DIAGNOSIS — Z09 Encounter for follow-up examination after completed treatment for conditions other than malignant neoplasm: Secondary | ICD-10-CM

## 2024-10-22 NOTE — Progress Notes (Signed)
 Patient presents for 6week postop from [facility], bilateral salpingectomy, cystoscopy. She is doing well. No fevers, VB, dysuria or severe abdominal pain.  BP 138/60   Pulse 85   Wt 170 lb 6.4 oz (77.3 kg)   LMP 08/17/2024   SpO2 99%   BMI 29.25 kg/m     A/p PO from [facility] 10 weeks doing well Resume activities 2. Pelvic rest funtil cleared.  She is not sexually active 3. RTC for 10 wk PO or sooner with any concners  Dr. Glennon

## 2024-10-27 ENCOUNTER — Ambulatory Visit: Payer: Self-pay | Admitting: Nurse Practitioner

## 2024-10-27 VITALS — BP 120/80 | HR 76 | Temp 98.6°F | Ht 64.0 in | Wt 173.4 lb

## 2024-10-27 DIAGNOSIS — I10 Essential (primary) hypertension: Secondary | ICD-10-CM | POA: Diagnosis not present

## 2024-10-27 DIAGNOSIS — R82998 Other abnormal findings in urine: Secondary | ICD-10-CM

## 2024-10-27 DIAGNOSIS — E669 Obesity, unspecified: Secondary | ICD-10-CM

## 2024-10-27 DIAGNOSIS — L720 Epidermal cyst: Secondary | ICD-10-CM

## 2024-10-27 DIAGNOSIS — G4733 Obstructive sleep apnea (adult) (pediatric): Secondary | ICD-10-CM

## 2024-10-27 DIAGNOSIS — Z2821 Immunization not carried out because of patient refusal: Secondary | ICD-10-CM

## 2024-10-27 DIAGNOSIS — E1169 Type 2 diabetes mellitus with other specified complication: Secondary | ICD-10-CM | POA: Diagnosis not present

## 2024-10-27 DIAGNOSIS — D500 Iron deficiency anemia secondary to blood loss (chronic): Secondary | ICD-10-CM

## 2024-10-27 DIAGNOSIS — E7800 Pure hypercholesterolemia, unspecified: Secondary | ICD-10-CM

## 2024-10-27 DIAGNOSIS — Z79899 Other long term (current) drug therapy: Secondary | ICD-10-CM

## 2024-10-27 DIAGNOSIS — L603 Nail dystrophy: Secondary | ICD-10-CM

## 2024-10-27 DIAGNOSIS — D352 Benign neoplasm of pituitary gland: Secondary | ICD-10-CM

## 2024-10-27 DIAGNOSIS — Z0000 Encounter for general adult medical examination without abnormal findings: Secondary | ICD-10-CM

## 2024-10-27 DIAGNOSIS — Z139 Encounter for screening, unspecified: Secondary | ICD-10-CM

## 2024-10-27 LAB — POCT URINALYSIS DIP (CLINITEK)
Bilirubin, UA: NEGATIVE
Blood, UA: NEGATIVE
Glucose, UA: NEGATIVE mg/dL
Ketones, POC UA: NEGATIVE mg/dL
Nitrite, UA: NEGATIVE
POC PROTEIN,UA: NEGATIVE
Spec Grav, UA: 1.015 (ref 1.010–1.025)
Urobilinogen, UA: 0.2 U/dL
pH, UA: 5.5 (ref 5.0–8.0)

## 2024-10-27 MED ORDER — DOXYCYCLINE HYCLATE 50 MG PO CAPS: 50.0000 mg | ORAL_CAPSULE | Freq: Every day | ORAL | 1 refills | Status: AC

## 2024-10-27 NOTE — Patient Instructions (Signed)
 Health Maintenance  Topic Date Due   Hepatitis B Vaccine (1 of 3 - 19+ 3-dose series) Never done   Yearly kidney health urinalysis for diabetes  10/14/2024   Complete foot exam   10/14/2024   COVID-19 Vaccine (6 - 2025-26 season) 11/12/2024*   Zoster (Shingles) Vaccine (2 of 2) 01/27/2025*   Flu Shot  03/30/2025*   Pneumococcal Vaccine for age over 31 (1 of 2 - PCV) 10/27/2025*   Eye exam for diabetics  12/05/2024   Hemoglobin A1C  12/16/2024   Cologuard (Stool DNA test)  01/21/2025   Yearly kidney function blood test for diabetes  09/11/2025   Pap with HPV screening  10/09/2025   Breast Cancer Screening  06/09/2026   DTaP/Tdap/Td vaccine (4 - Td or Tdap) 06/18/2033   Hepatitis C Screening  Completed   HIV Screening  Completed   HPV Vaccine  Aged Out   Meningitis B Vaccine  Aged Out  *Topic was postponed. The date shown is not the original due date.

## 2024-10-27 NOTE — Progress Notes (Unsigned)
 LILLETTE Kristeen JINNY Gladis, CMA,acting as a neurosurgeon for Gaines Ada, FNP.,have documented all relevant documentation on the behalf of Gaines Ada, FNP,as directed by  Gaines Ada, FNP while in the presence of Gaines Ada, FNP.  Subjective:    Patient ID: Melanie Little , female    DOB: [birth year] , 53 y.o.   MRN: [id number]  Chief Complaint  Patient presents with   Annual Exam    Patient presents today for HM, Patient reports compliance with medication. Patient denies any chest pain, SOB, or headaches. Patient has no concerns today.     HPI  HPI   Past Medical History:  Diagnosis Date   Boil    R axilla   Chronic low back pain    Chronic migraine without aura    Dysmenorrhea    Endometriosis determined by laparoscopy    History of abnormal cervical Pap smear 2016   colpo bx w/ normal results per pt . colpo HPV changes   Hyperlipidemia    Hyperprolactinemia    followed by endocrinologist--- duwaine doerr NP   Hypertension    Iron  deficiency anemia due to chronic blood loss    recent Iron  infusions---- x5 07/ 2025   Menorrhagia with regular cycle    OSA on CPAP 2016   (09-07-2024  pt stated uses cpap nightly)  followed by dr dohmeier;   dx 2016  HST ;  restested HST 09-10-2022 in epic   Pituitary adenoma (HCC) 02/09/2023   MIR - 8 x 5 mm;  followed by neurologist--- dr chalice and neurosurgeon --- dr tressia   Resistant hypertension 05/02/2021   followed by cardiologist--- dr t. raford;   NUC 02/ 2022  LR normal perfusion, nuclear stress ef 50% but very elevated BP;   echo 01/2021 ef 60-65% w/ moderate LVH;  CTC 09-/2024  calcium  score zero;  normal renal arter dopplers 11/ 2018   Right ovarian cyst    Type 2 diabetes mellitus (HCC)    followed by pcp;    (09-07-2024   pt stated does not check blood sugar at home)  treated w/ mounjaro    Uterine fibroid    multiple fibroids     Family History  Problem Relation Age of Onset   CVA Mother 22       deceased, was 13 at the age of her  first stroke   Stroke Mother    Diabetes Father    Deep vein thrombosis Father    Breast cancer Maternal Aunt    Heart attack Maternal Aunt    Breast cancer Maternal Aunt    Sleep apnea Neg Hx      Current Outpatient Medications:    amLODipine  (NORVASC ) 5 MG tablet, TAKE 1 TABLET(5 MG) BY MOUTH DAILY, Disp: 90 tablet, Rfl: 3   cabergoline  (DOSTINEX ) 0.5 MG tablet, TAKE 1/2 TABLET BY MOUTH 2 TIMES A WEEK, Disp: 8 tablet, Rfl: 0   doxycycline  (VIBRAMYCIN ) 50 MG capsule, Take 1 capsule (50 mg total) by mouth daily., Disp: 90 capsule, Rfl: 1   estradiol  (ESTRACE  VAGINAL) 0.1 MG/GM vaginal cream, Rub pea size amount each night until seen again for vaginal cuff healing, Disp: 42.5 g, Rfl: 12   ibuprofen  (ADVIL ) 800 MG tablet, Take 1 tablet (800 mg total) by mouth every 8 (eight) hours as needed., Disp: 30 tablet, Rfl: 1   Multiple Vitamins-Minerals (MULTIVITAMIN WOMEN 50+) TABS, Take 1 tablet by mouth daily., Disp: , Rfl:    nebivolol  (BYSTOLIC ) 10 MG tablet, Take 1 tablet (  10 mg total) by mouth daily., Disp: 90 tablet, Rfl: 2   spironolactone  (ALDACTONE ) 25 MG tablet, TAKE 1 TABLET(25 MG) BY MOUTH DAILY, Disp: 90 tablet, Rfl: 2   tirzepatide  (MOUNJARO ) 12.5 MG/0.5ML Pen, Inject 12.5 mg into the skin once a week., Disp: 6 mL, Rfl: 1   valsartan  (DIOVAN ) 160 MG tablet, TAKE 1 TABLET(160 MG) BY MOUTH DAILY, Disp: 90 tablet, Rfl: 2   Allergies  Allergen Reactions   Sulfamethoxazole -Trimethoprim  Hives, Itching, Swelling and Other (See Comments)   Sulfa  Antibiotics Itching and Swelling    Other Reaction(s): itching; eye swelling      The patient states she uses status post hysterectomy for birth control. Patient's last menstrual period was 08/17/2024.   Negative for: breast discharge, breast lump(s), breast pain and breast self exam. Associated symptoms include abnormal vaginal bleeding. Pertinent negatives include abnormal bleeding (hematology), anxiety, decreased libido, depression, difficulty  falling sleep, dyspareunia, history of infertility, nocturia, sexual dysfunction, sleep disturbances, urinary incontinence, urinary urgency, vaginal discharge and vaginal itching. Diet regular. The patient states her exercise level is none.    The patient's tobacco use is:  Social History   Tobacco Use  Smoking Status Never  Smokeless Tobacco Never   She has been exposed to passive smoke. The patient's alcohol use is:  Social History   Substance and Sexual Activity  Alcohol Use Not Currently   Comment: seldom    Review of Systems  Constitutional: Negative.   HENT: Negative.    Eyes: Negative.   Respiratory: Negative.    Cardiovascular: Negative.   Gastrointestinal: Negative.   Endocrine: Negative.   Genitourinary: Negative.   Musculoskeletal: Negative.   Skin: Negative.   Allergic/Immunologic: Negative.   Neurological: Negative.   Hematological: Negative.   Psychiatric/Behavioral: Negative.       Today's Vitals   10/27/24 1538  BP: 120/80  Pulse: 76  Temp: 98.6 F (37 C)  TempSrc: Oral  Weight: 173 lb 6.4 oz (78.7 kg)  Height: 5' 4 (1.626 m)  PainSc: 6   PainLoc: Back   Body mass index is 29.76 kg/m.  Wt Readings from Last 3 Encounters:  10/27/24 173 lb 6.4 oz (78.7 kg)  10/22/24 170 lb 6.4 oz (77.3 kg)  09/24/24 170 lb (77.1 kg)     Objective:  Physical Exam Vitals and nursing note reviewed.  Constitutional:      General: She is not in acute distress.    Appearance: Normal appearance. She is well-developed. She is obese.  HENT:     Head: Normocephalic and atraumatic.     Right Ear: Hearing, tympanic membrane, ear canal and external ear normal. There is no impacted cerumen.     Left Ear: Hearing, tympanic membrane, ear canal and external ear normal. There is no impacted cerumen.     Nose: Nose normal.     Mouth/Throat:     Mouth: Mucous membranes are moist.  Eyes:     General: Lids are normal.     Extraocular Movements: Extraocular movements  intact.     Conjunctiva/sclera: Conjunctivae normal.     Pupils: Pupils are equal, round, and reactive to light.     Funduscopic exam:    Right eye: No papilledema.        Left eye: No papilledema.  Neck:     Thyroid : No thyroid  mass.     Vascular: No carotid bruit.  Cardiovascular:     Rate and Rhythm: Normal rate and regular rhythm.     Pulses: Normal  pulses.     Heart sounds: Normal heart sounds. No murmur heard. Pulmonary:     Effort: Pulmonary effort is normal. No respiratory distress.     Breath sounds: Normal breath sounds. No wheezing.  Chest:     Chest wall: No mass.  Breasts:    Tanner Score is 5.     Right: Normal. No mass or tenderness.     Left: Normal. No mass or tenderness.  Abdominal:     General: Abdomen is flat. Bowel sounds are normal. There is no distension.     Palpations: Abdomen is soft.     Tenderness: There is no abdominal tenderness.  Musculoskeletal:        General: No swelling or tenderness. Normal range of motion.     Cervical back: Full passive range of motion without pain, normal range of motion and neck supple.     Right lower leg: No edema.     Left lower leg: No edema.  Lymphadenopathy:     Upper Body:     Right upper body: No supraclavicular, axillary or pectoral adenopathy.     Left upper body: No supraclavicular, axillary or pectoral adenopathy.  Skin:    General: Skin is warm and dry.     Capillary Refill: Capillary refill takes less than 2 seconds.  Neurological:     General: No focal deficit present.     Mental Status: She is alert and oriented to person, place, and time.     Cranial Nerves: No cranial nerve deficit.     Sensory: No sensory deficit.     Motor: No weakness.  Psychiatric:        Mood and Affect: Mood normal.        Behavior: Behavior normal.        Thought Content: Thought content normal.        Judgment: Judgment normal.         Assessment And Plan:     Encounter for annual health examination  Primary  hypertension -     POCT URINALYSIS DIP (CLINITEK) -     Microalbumin / creatinine urine ratio  Iron  deficiency anemia due to chronic blood loss  Elevated cholesterol  Type 2 diabetes mellitus in patient with obesity (HCC)  OSA on CPAP  Herpes zoster vaccination declined  Influenza vaccination declined  Other long term (current) drug therapy  Encounter for screening     Return for 1 year physical, controlled DM check 4 months. Patient was given opportunity to ask questions. Patient verbalized understanding of the plan and was able to repeat key elements of the plan. All questions were answered to their satisfaction.   Gaines Ada, FNP  I, Gaines Ada, FNP, have reviewed all documentation for this visit. The documentation on 10/27/24 for the exam, diagnosis, procedures, and orders are all accurate and complete.

## 2024-10-28 LAB — LIPID PANEL
Chol/HDL Ratio: 2.9 ratio (ref 0.0–4.4)
Cholesterol, Total: 162 mg/dL (ref 100–199)
HDL: 56 mg/dL (ref >39)
LDL Chol Calc (NIH): 81 mg/dL (ref 0–99)
Triglycerides: 144 mg/dL (ref 0–149)
VLDL Cholesterol Cal: 25 mg/dL (ref 5–40)

## 2024-10-28 LAB — MICROALBUMIN / CREATININE URINE RATIO
Creatinine, Urine: 81.7 mg/dL
Microalb/Creat Ratio: 5 mg/g{creat} (ref 0–29)
Microalbumin, Urine: 3.7 ug/mL

## 2024-10-28 LAB — HEPATITIS B SURFACE ANTIBODY,QUALITATIVE: Hep B Surface Ab, Qual: REACTIVE

## 2024-10-28 LAB — VITAMIN D 25 HYDROXY (VIT D DEFICIENCY, FRACTURES): Vit D, 25-Hydroxy: 27.9 ng/mL — ABNORMAL LOW (ref 30.0–100.0)

## 2024-10-30 ENCOUNTER — Ambulatory Visit: Payer: Self-pay | Admitting: Nurse Practitioner

## 2024-10-30 DIAGNOSIS — L603 Nail dystrophy: Secondary | ICD-10-CM | POA: Insufficient documentation

## 2024-10-30 DIAGNOSIS — L720 Epidermal cyst: Secondary | ICD-10-CM | POA: Insufficient documentation

## 2024-10-30 DIAGNOSIS — R82998 Other abnormal findings in urine: Secondary | ICD-10-CM | POA: Insufficient documentation

## 2024-10-30 LAB — URINE CULTURE

## 2024-10-30 NOTE — Assessment & Plan Note (Signed)
 Persistent for a few years. - Consult a podiatrist for evaluation and potential removal.

## 2024-10-30 NOTE — Assessment & Plan Note (Signed)
 Routine wellness visit with post-hysterectomy discussion on iron  levels. Reviewed medications and refills. - Check A1c level. - Check vitamin D  level. - Check lipid panel. - Check hepatitis B immunity. - Perform ear irrigation for right ear. - Encourage monthly self-breast exams. - Discuss regular foot checks for open areas.

## 2024-10-30 NOTE — Assessment & Plan Note (Signed)
 Managed with Dostinex . Prescription refill issues noted, managed by Dr. Lindalee. Recent prolactin levels checked by hematologist. - Ensure prolactin levels are monitored by endocrinologist.

## 2024-10-30 NOTE — Assessment & Plan Note (Signed)
 Blood pressure is well fairly controlled, continue current medications.

## 2024-10-30 NOTE — Assessment & Plan Note (Signed)
 Recent A1c was 5.7. - Continue Mounjaro  12.5 MG/0.5ML subcutaneous weekly. - Monitor blood glucose levels regularly.

## 2024-10-30 NOTE — Assessment & Plan Note (Signed)
 Dermatology appointment scheduled for February. - Refill doxycycline  50 MG oral daily. - Follow up with dermatologist in February.

## 2024-11-02 ENCOUNTER — Encounter: Payer: Self-pay | Admitting: Radiology

## 2024-11-19 ENCOUNTER — Ambulatory Visit: Admitting: Obstetrics and Gynecology

## 2024-11-19 VITALS — BP 130/82 | HR 73 | Wt 168.0 lb

## 2024-11-19 DIAGNOSIS — Z09 Encounter for follow-up examination after completed treatment for conditions other than malignant neoplasm: Secondary | ICD-10-CM

## 2024-11-19 NOTE — Progress Notes (Signed)
 Patient presents for 10 week postop from [facility], bilateral salpingectomy, cystoscopy. She is doing well. No fevers, VB, dysuria or severe abdominal pain.  BP 130/82 (BP Location: Left Arm, Patient Position: Sitting, Cuff Size: Large)   Pulse 73   Wt 168 lb (76.2 kg)   LMP 08/17/2024   SpO2 97%   BMI 28.84 kg/m   SVE: sutures dissolved, good cuff support, normal discharge, no bleeding Qtip palpation with slight tenderness with palpation  A/p PO from [facility] 10 weeks doing well Resume activities 2. Pelvic rest for additional 2 wks 3. Encouraged annual mammograms and resume annual care 4. Resume vaginal estrogen 3-4 times a week  Dr. Glennon

## 2024-11-19 NOTE — Patient Instructions (Signed)
 Perimenopause/Menopause suggestions   You should be getting 1,200 mg of calcium a day between your diet and supplements and at least 3000 IU a day of Vit D. You should exercise regularly with weight bearing exercises. I would recommend a bone density q 2 years.  We loose 5% per year in this time period of our bone density, due to the loss of estrogen. Magnesium  at bedtime for our sleep and bones (follow recommended dose on bottle)  Please read The New Menopause my Dr. Ronal Estefana Morton and Estrogen Matters  Try to avoid caffeine and alcohol in this period, as they can make symptoms worse  Continue annual mammograms, unless told sooner  Please reach out with any questions Melanie Little Melanie Little

## 2024-12-08 LAB — OPHTHALMOLOGY REPORT-SCANNED

## 2025-02-10 ENCOUNTER — Ambulatory Visit: Admitting: Dermatology

## 2025-02-25 ENCOUNTER — Ambulatory Visit: Admitting: Neurology

## 2025-03-08 ENCOUNTER — Ambulatory Visit: Admitting: Obstetrics and Gynecology

## 2025-03-11 ENCOUNTER — Ambulatory Visit: Admitting: Neurology

## 2025-04-06 ENCOUNTER — Ambulatory Visit: Admitting: Neurology
